# Patient Record
Sex: Female | Born: 1945 | Race: Black or African American | Hispanic: No | Marital: Married | Smoking: Never smoker
Health system: Southern US, Community
[De-identification: ages and names within clinical notes are randomized; demographics above are authoritative.]

## PROBLEM LIST (undated history)

## (undated) DIAGNOSIS — I872 Venous insufficiency (chronic) (peripheral): Secondary | ICD-10-CM

## (undated) DIAGNOSIS — R609 Edema, unspecified: Secondary | ICD-10-CM

## (undated) DIAGNOSIS — T7840XA Allergy, unspecified, initial encounter: Secondary | ICD-10-CM

## (undated) DIAGNOSIS — M1990 Unspecified osteoarthritis, unspecified site: Secondary | ICD-10-CM

## (undated) DIAGNOSIS — B019 Varicella without complication: Secondary | ICD-10-CM

## (undated) DIAGNOSIS — R112 Nausea with vomiting, unspecified: Secondary | ICD-10-CM

## (undated) DIAGNOSIS — Z9889 Other specified postprocedural states: Secondary | ICD-10-CM

## (undated) DIAGNOSIS — R011 Cardiac murmur, unspecified: Secondary | ICD-10-CM

## (undated) HISTORY — DX: Edema, unspecified: R60.9

## (undated) HISTORY — DX: Varicella without complication: B01.9

## (undated) HISTORY — DX: Unspecified osteoarthritis, unspecified site: M19.90

## (undated) HISTORY — DX: Allergy, unspecified, initial encounter: T78.40XA

## (undated) HISTORY — PX: CARPAL TUNNEL RELEASE: SHX101

---

## 1986-07-04 HISTORY — PX: ABDOMINAL HYSTERECTOMY: SHX81

## 1998-05-27 ENCOUNTER — Ambulatory Visit (HOSPITAL_COMMUNITY): Admission: RE | Admit: 1998-05-27 | Discharge: 1998-05-27 | Payer: Self-pay | Admitting: *Deleted

## 1998-05-27 ENCOUNTER — Encounter: Payer: Self-pay | Admitting: *Deleted

## 1999-06-17 ENCOUNTER — Encounter: Payer: Self-pay | Admitting: *Deleted

## 1999-06-17 ENCOUNTER — Ambulatory Visit (HOSPITAL_COMMUNITY): Admission: RE | Admit: 1999-06-17 | Discharge: 1999-06-17 | Payer: Self-pay | Admitting: Obstetrics

## 2000-06-19 ENCOUNTER — Encounter: Payer: Self-pay | Admitting: *Deleted

## 2000-06-19 ENCOUNTER — Ambulatory Visit (HOSPITAL_COMMUNITY): Admission: RE | Admit: 2000-06-19 | Discharge: 2000-06-19 | Payer: Self-pay | Admitting: *Deleted

## 2008-07-04 HISTORY — PX: REPLACEMENT TOTAL KNEE: SUR1224

## 2008-07-04 LAB — HM COLONOSCOPY

## 2008-08-25 ENCOUNTER — Ambulatory Visit: Payer: Self-pay | Admitting: Cardiovascular Disease

## 2008-08-26 ENCOUNTER — Inpatient Hospital Stay (HOSPITAL_COMMUNITY): Admission: RE | Admit: 2008-08-26 | Discharge: 2008-08-30 | Payer: Self-pay | Admitting: Orthopedic Surgery

## 2008-10-17 ENCOUNTER — Ambulatory Visit (HOSPITAL_COMMUNITY): Admission: RE | Admit: 2008-10-17 | Discharge: 2008-10-17 | Payer: Self-pay | Admitting: Orthopedic Surgery

## 2009-01-20 ENCOUNTER — Inpatient Hospital Stay (HOSPITAL_COMMUNITY): Admission: RE | Admit: 2009-01-20 | Discharge: 2009-01-22 | Payer: Self-pay | Admitting: Orthopedic Surgery

## 2010-10-10 LAB — CBC
HCT: 36.2 % (ref 36.0–46.0)
Hemoglobin: 12.3 g/dL (ref 12.0–15.0)
MCHC: 33.9 g/dL (ref 30.0–36.0)
MCV: 89.1 fL (ref 78.0–100.0)
Platelets: 268 10*3/uL (ref 150–400)
RBC: 4.06 MIL/uL (ref 3.87–5.11)
RDW: 14.6 % (ref 11.5–15.5)
WBC: 5.7 10*3/uL (ref 4.0–10.5)

## 2010-10-10 LAB — GLUCOSE, CAPILLARY
Glucose-Capillary: 112 mg/dL — ABNORMAL HIGH (ref 70–99)
Glucose-Capillary: 120 mg/dL — ABNORMAL HIGH (ref 70–99)
Glucose-Capillary: 126 mg/dL — ABNORMAL HIGH (ref 70–99)
Glucose-Capillary: 148 mg/dL — ABNORMAL HIGH (ref 70–99)
Glucose-Capillary: 96 mg/dL (ref 70–99)

## 2010-10-10 LAB — TYPE AND SCREEN: Antibody Screen: NEGATIVE

## 2010-10-13 LAB — CBC
MCHC: 34.2 g/dL (ref 30.0–36.0)
Platelets: 318 10*3/uL (ref 150–400)
RBC: 4.09 MIL/uL (ref 3.87–5.11)
WBC: 5 10*3/uL (ref 4.0–10.5)

## 2010-10-19 LAB — TYPE AND SCREEN: ABO/RH(D): O NEG

## 2010-10-19 LAB — PROTIME-INR
INR: 1 (ref 0.00–1.49)
INR: 1.3 (ref 0.00–1.49)
INR: 2 — ABNORMAL HIGH (ref 0.00–1.49)
INR: 2.4 — ABNORMAL HIGH (ref 0.00–1.49)
INR: 2.8 — ABNORMAL HIGH (ref 0.00–1.49)
Prothrombin Time: 23.4 seconds — ABNORMAL HIGH (ref 11.6–15.2)
Prothrombin Time: 27.6 seconds — ABNORMAL HIGH (ref 11.6–15.2)
Prothrombin Time: 31.5 seconds — ABNORMAL HIGH (ref 11.6–15.2)

## 2010-10-19 LAB — CBC
Hemoglobin: 10.3 g/dL — ABNORMAL LOW (ref 12.0–15.0)
Hemoglobin: 10.5 g/dL — ABNORMAL LOW (ref 12.0–15.0)
Hemoglobin: 12.8 g/dL (ref 12.0–15.0)
MCHC: 34.7 g/dL (ref 30.0–36.0)
Platelets: 192 10*3/uL (ref 150–400)
Platelets: 206 10*3/uL (ref 150–400)
RBC: 3.29 MIL/uL — ABNORMAL LOW (ref 3.87–5.11)
RBC: 4.06 MIL/uL (ref 3.87–5.11)
RDW: 13.1 % (ref 11.5–15.5)
RDW: 13.3 % (ref 11.5–15.5)
WBC: 5.8 10*3/uL (ref 4.0–10.5)
WBC: 6.8 10*3/uL (ref 4.0–10.5)

## 2010-10-19 LAB — DIFFERENTIAL
Lymphocytes Relative: 29 % (ref 12–46)
Lymphs Abs: 1.7 10*3/uL (ref 0.7–4.0)
Monocytes Absolute: 0.7 10*3/uL (ref 0.1–1.0)
Monocytes Relative: 12 % (ref 3–12)
Neutro Abs: 3.1 10*3/uL (ref 1.7–7.7)
Neutrophils Relative %: 54 % (ref 43–77)

## 2010-10-19 LAB — BASIC METABOLIC PANEL
BUN: 10 mg/dL (ref 6–23)
BUN: 6 mg/dL (ref 6–23)
CO2: 27 mEq/L (ref 19–32)
Calcium: 8.2 mg/dL — ABNORMAL LOW (ref 8.4–10.5)
Calcium: 8.4 mg/dL (ref 8.4–10.5)
Calcium: 9 mg/dL (ref 8.4–10.5)
Creatinine, Ser: 0.74 mg/dL (ref 0.4–1.2)
Creatinine, Ser: 0.77 mg/dL (ref 0.4–1.2)
GFR calc Af Amer: >60 mL/min (ref >60)
GFR calc non Af Amer: >60 mL/min (ref >60)
GFR calc non Af Amer: >60 mL/min (ref >60)
Glucose, Bld: 122 mg/dL — ABNORMAL HIGH (ref 70–99)
Glucose, Bld: 130 mg/dL — ABNORMAL HIGH (ref 70–99)
Potassium: 4.2 mEq/L (ref 3.5–5.1)
Sodium: 136 mEq/L (ref 135–145)
Sodium: 138 mEq/L (ref 135–145)
Sodium: 141 mEq/L (ref 135–145)

## 2010-10-19 LAB — URINALYSIS, ROUTINE W REFLEX MICROSCOPIC
Glucose, UA: NEGATIVE mg/dL
Ketones, ur: NEGATIVE mg/dL
Nitrite: NEGATIVE
Specific Gravity, Urine: 1.025 (ref 1.005–1.030)
pH: 7.5 (ref 5.0–8.0)

## 2010-10-19 LAB — GLUCOSE, CAPILLARY
Glucose-Capillary: 112 mg/dL — ABNORMAL HIGH (ref 70–99)
Glucose-Capillary: 115 mg/dL — ABNORMAL HIGH (ref 70–99)
Glucose-Capillary: 118 mg/dL — ABNORMAL HIGH (ref 70–99)
Glucose-Capillary: 122 mg/dL — ABNORMAL HIGH (ref 70–99)
Glucose-Capillary: 128 mg/dL — ABNORMAL HIGH (ref 70–99)
Glucose-Capillary: 137 mg/dL — ABNORMAL HIGH (ref 70–99)
Glucose-Capillary: 147 mg/dL — ABNORMAL HIGH (ref 70–99)
Glucose-Capillary: 150 mg/dL — ABNORMAL HIGH (ref 70–99)
Glucose-Capillary: 165 mg/dL — ABNORMAL HIGH (ref 70–99)

## 2010-10-19 LAB — ABO/RH: ABO/RH(D): O NEG

## 2010-11-16 NOTE — Op Note (Signed)
[name], [name]                ACCOUNT NO.:  [id number]   MEDICAL RECORD NO.:  [id number]          PATIENT TYPE:  AMB   LOCATION:  SDS                          FACILITY:  [facility]   PHYSICIAN:  Burnard Bunting, M.D.    DATE OF BIRTH:  Feb 25, 1946   DATE OF PROCEDURE:  10/17/2008  DATE OF DISCHARGE:                               OPERATIVE REPORT   PREOPERATIVE DIAGNOSIS:  Right knee osteofibrosis.   POSTOPERATIVE DIAGNOSIS:  Right knee osteofibrosis.   PROCEDURE:  Right knee manipulation under anesthesia with injection of  postoperative pain relief agents.   SURGEON:  Burnard Bunting, M.D.   ASSISTANT:  None.   ANESTHESIA:  General.   INDICATIONS:  Julia Mcfarland is a 65 year old patient.  She is about 6  weeks out from right total knee replacement with limited range of  motion.  She presents now for operative management of osteofibrosis.  Preop range of motion is 5-50 degrees, post manipulation range of motion  is 5-95 degrees.   PROCEDURE IN DETAIL:  The patient was brought to the operating room  where general endotracheal anesthesia was induced.  A time-out was  called.  Right knee was then manipulated under anesthesia after muscle  relaxation was incurred.  Did achieve about 95 degrees of flexion  against gravity.  At this time, the knee was injected with a solution of  Marcaine, morphine, and clonidine.  Photos were made.  The patient  tolerated the procedure well without immediate complications.  She was  transferred to recovery room in stable condition.      Burnard Bunting, M.D.  Electronically Signed     GSD/MEDQ  D:  10/17/2008  T:  10/17/2008  Job:  [id number]

## 2010-11-16 NOTE — Op Note (Signed)
Julia Mcfarland, Julia Mcfarland                ACCOUNT NO.:  [id number]   MEDICAL RECORD NO.:  [id number]          PATIENT TYPE:  INP   LOCATION:  5006                         FACILITY:  [facility]   PHYSICIAN:  Burnard Bunting, M.D.    DATE OF BIRTH:  1945-12-28   DATE OF PROCEDURE:  01/20/2009  DATE OF DISCHARGE:                               OPERATIVE REPORT   PREOPERATIVE DIAGNOSIS:  Right knee arthrofibrosis.   POSTOPERATIVE DIAGNOSIS:  Right knee arthrofibrosis.   PROCEDURE:  Right knee open arthrofibrosis excision and synovectomy.   SURGEON:  Burnard Bunting, MD   ASSISTANT:  None.   ANESTHESIA:  General endotracheal.   ESTIMATED BLOOD LOSS:  Less than 50.   TOURNIQUET TIME:  Approximately 36 minutes of 325 mmHg.   INDICATIONS:  Julia Mcfarland is a 66 year old patient with right knee pain  and stiffness following total knee arthroplasty approximately 4-5  months.  The patient presents now for open synovectomy after failure of  manipulation.  Preoperative range of motion 15 to approximately 60  degrees of flexion.  Postoperative range of motion 10-105 degrees of  flexion.   PROCEDURE IN DETAIL:  The patient was brought to the operating room  where general endotracheal anesthesia was induced.  Preoperative  antibiotics were administered.  Right leg and foot was prepped with  DuraPrep solution after prescrubbed with chlorhexidine, alcohol, and  Betadine, which was allowed to air dry.  Collier Flowers was used to cover the  operative field.  Time-out was called.  Right leg was elevated  and  exsanguinated with an Esmarch wrap.  Tourniquet was inflated.  Previous  incision was utilized and extended proximally and distally about a 3 cm.  Skin and subcutaneous tissues were sharply divided.  Dense adherent scar  tissue was present even in the suprapatellar region.  Median  parapatellar arthrotomy was made.  There was complete obliteration of  the medial and lateral gutters as well as the suprapatellar  space.  Using careful blunt and sharp dissection, dense adherent scar tissue was  removed from the medial gutter down to the tibial plateau.  In a similar  fashion, adherent scar tissue was removed from the distal anterior femur  and up in order to recreate a suprapatellar pouch.  At this time,  attention was directed towards the lateral gutter.  Dense adherent scar  tissue from behind the patella was removed and that plane was maintained  in order to recreate the lateral gutter.  Following the removal of this  adherent scar tissue, the knee had a range of motion to 105 easily.  At  this time, 3 L of irrigating solution was utilized.  Thrombin spray was  placed into the incision and the tourniquet was released.  Bleeding  points encountered were controlled with electrocautery.  Incision was  then closed over a Hemovac drain using interrupted inverted #1 Vicryl  suture, 0-Vicryl suture, 2-0  Vicryl suture, and skin staples.  Solution of Marcaine and morphine  finally injected into the knee.  The patient was placed in a bulky  dressing and will begin TPN medially  in the recovery room.  The patient  tolerated the procedure well without immediate complication.      Burnard Bunting, M.D.  Electronically Signed     GSD/MEDQ  D:  01/20/2009  T:  01/21/2009  Job:  [id number]

## 2010-11-16 NOTE — Assessment & Plan Note (Signed)
Julia Mcfarland                            CARDIOLOGY OFFICE NOTE   Julia Mcfarland, Julia Mcfarland                       MRN:          [id number]  DATE:08/25/2008                            DOB:          April 29, 1946    Julia Mcfarland is a 65 year old patient referred by Dr. Dorene Grebe for preop  clearance.  Unfortunately, the patient showed up on Monday and is  already scheduled to have surgery tomorrow at 1130.  We initially had no  records on the patient.  My nurse called over to [facility] and got  some office notes.  Again unfortunately, the reason for her referral was  not apparent.  The patient indicates that she appears to have had some  test by [facility].  She describes having an echo.  She  says this was abnormal.  I did not have any echo report.  I personally  called Dr. Diamantina Providence office and talk to 1 of the PAs.   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  I explained to the patient that this is a  insignificant finding and has no clinical relevance whatsoever.   In talking to the patient, she is obese.  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  She has a  large ganglion cyst and bone on bone in the right knee and needs right  knee replacement.  She has not had previous surgery before.  She is  nonsmoker, non-diabetic, although she says she is borderline.  She is  not on any blood pressure medicine or diabetes medicine.  Cholesterol  status is unknown.   She is sedentary, but in general does not have chest pain or  palpitations.  She has trace lower extremity edema that appears  dependent.   She denies any allergies.  Her past medical history is remarkable for no  prior surgeries or hospitalizations.   She does get occasional bronchitis.  She takes Naprosyn and Tylenol for  her pain.  Family history is positive for diabetes and high  blood  pressure.  Review of systems is otherwise negative.   PHYSICAL EXAMINATION:  GENERAL:  Her exam is remarkable for an  overweight black female.  She is somewhat confused.  She is very poor  historian.  She is 5 feet 1 inches, 240 pounds.  VITAL SIGNS:  Respiratory rate 14, afebrile.  Blood pressure 140/80,  pulse is 62 and regular.  HEENT:  Unremarkable.  Carotids are normal without bruit.  No  lymphadenopathy, thyromegaly, JVP elevation.  LUNGS:  Clear.  Good diaphragmatic motion.  No wheezing.  S1 and S2.  Normal heart sounds.  PMI normal.  ABDOMEN:  Benign.  Bowel sounds positive.  No AAA.  No tenderness.  No  bruit.  No hepatosplenomegaly or hepatojugular reflux.  EXTREMITIES:  Distal pulses are intact.  No trace edema.  NEURO:  Nonfocal.  SKIN:  Warm and dry.  MUSCULOSKELETAL:  No muscular weakness.   EKG is totally normal.  IMPRESSION:  1. In regards to preoperative clearance, the patient's biggest risk is      for preoperative deep venous thrombosis from a cardiac perspective.      She has limited risk factors.  Vital signs were normal.      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  She is cleared for      surgery tomorrow.  2. Deep venous thrombosis prophylaxis.  Because of the patient's body      habitus, she has lower extremity edema preexisting.  Lovenox with      Coumadin overlap around the knee replacement and early mobilization      will be key to prevent pulmonary embolism.  3. Borderline diabetes.  I would follow sugars in the hospital.  She      has body habitus to suggest type 2 diabetes.  I would make sure      that [facility], checks the hemoglobin A1c quarterly      on her.  4. Arthritis and right knee pain.  To have surgery with Dr. August Saucer      tomorrow.  Continue nonsteroidals and Naprosyn.     Noralyn Pick. Eden Emms, MD, [facility]  Electronically Signed    PCN/MedQ   DD: 08/25/2008  DT: 08/25/2008  Job #: [id number]

## 2010-11-16 NOTE — Op Note (Signed)
[name], [name] NO.:  [id number]   MEDICAL RECORD NO.:  [id number]          PATIENT TYPE:  INP   LOCATION:  5033                         FACILITY:  [facility]   PHYSICIAN:  Burnard Bunting, M.D.    DATE OF BIRTH:  July 20, 1945   DATE OF PROCEDURE:  DATE OF DISCHARGE:                               OPERATIVE REPORT   PREOPERATIVE DIAGNOSIS:  Right knee arthritis.   POSTOPERATIVE DIAGNOSIS:  Right knee arthritis.   PROCEDURE:  Right total knee replacement.   SURGEON:  Burnard Bunting, MD   ASSISTANT:  Jerolyn Shin. Lavender, MD   ANESTHESIA:  General endotracheal.   ESTIMATED BLOOD LOSS:  150.   TOURNIQUET TIME:  107 minutes at 325 mmHg.   INDICATIONS:  Julia Mcfarland is a 65 year old female with end-stage right  knee arthritis who presents for operative management after failure of  conservative management and explanation of risks and benefits.   COMPONENTS INSERTED:  DePuy rotating platform, 3 femur, 35 patella, 3  tibia, 15 poly.   PROCEDURE IN DETAIL:  The patient was brought to the operating room  where general endotracheal anesthesia was induced.  Preoperative  antibiotics were administered.  Right leg was prescrubbed with alcohol  and Betadine which was allowed to air dry and then prepped with DuraPrep  solution and draped in a sterile manner.  Julia Mcfarland was used cover the  operative field.  Leg was elevated and exsanguinated with the Esmarch.  Tourniquet was inflated.  Anterior approach to the knee was utilized.  Skin and subcutaneous tissues were sharply divided.  Median parapatellar  arthrotomy was made.  Patella was everted.  Precise location of the  arthrotomy was marked with 0 Vicryl suture.  Patella could not be  everted.  Lateral patellofemoral ligament was released.  Soft tissue  from the anterior and distal aspect of the femur was removed while  maintaining the periosteum over the distal femur.  Fat pad was partially  excised.  The patient had  preoperative varus alignment.  A moderate  medial soft tissue release was performed of the superficial medial  collateral ligament.  The osteophytes were removed.  ACL and PCL were  released.  Two pins were placed in the distal and medial femur and  proximal medial tibia.  Bicortical contact was achieved.  Registration  points were achieved including the hips in rotation and bimalleolar  access in various points around the knee.  A cut was then made  perpendicular in mechanical axis with the collateral and posterior  structures well protected.  The tensioner was then placed, and tension  was checked in both extension and flexion.  Distal femoral cut was then  made.  A spacer block was placed, and the patient achieved full  extension with a 10-mm spacer block.  The chamfer cuts and box cut was  then performed.  Tibia was sized to 3, femur was also sized to 3.  The  patella was then cut in freehand fashion from 22 down to 12 mm.  Trial  patellar button was placed.  Trial reduction was performed with  both 10  and a 12.5 spacer with some hyperextension present.  At this time, trial  components were removed.  The bony cuts were then thoroughly irrigated.  The components were cemented into position.  After cement hardening had  occurred, trial reduction was again performed with a 12.5 and 15 poly  spacer.  The patient did achieve full extension with gravity with the 15  poly spacer.  This gave better stability in extension and 90 degrees of  flexion.  The 15 poly spacer was placed.  The patient achieved full  extension, full flexion, excellent patellar tracking with no thumbs  technique.  Tourniquet was released.  Bleeding points were controlled  with electrocautery.  Thorough irrigation was performed.  Hemovac drain  was placed.  The knee was then closed over a bolster using #1 Vicryl  suture, 0 Vicryl suture, 2-0 Vicryl suture, and skin staples.  A 3-0  nylon was used to close the incisions  for the pins.  The patient  tolerated the procedure well without immediate complication.  Foot was  perfused at conclusion of the case.  Dr. Lenny Mcfarland assistance was  required at all times during the case for retraction of important  neurovascular structures and mobilization of the tissues, and his  assistance was a medical necessity.      Burnard Bunting, M.D.  Electronically Signed     GSD/MEDQ  D:  08/26/2008  T:  08/27/2008  Job:  [id number]

## 2010-11-19 NOTE — Discharge Summary (Signed)
Julia Mcfarland, Julia Mcfarland                ACCOUNT NO.:  [id number]   MEDICAL RECORD NO.:  [id number]          PATIENT TYPE:  INP   LOCATION:  5033                         FACILITY:  [facility]   PHYSICIAN:  Burnard Bunting, M.D.    DATE OF BIRTH:  22-Jan-1946   DATE OF ADMISSION:  08/26/2008  DATE OF DISCHARGE:  08/30/2008                               DISCHARGE SUMMARY   ADMISSION DIAGNOSES:  1. Right knee osteoarthritis.  2. History of fibromyalgia.   DISCHARGE DIAGNOSES:  1. Right knee osteoarthritis.  2. History of fibromyalgia.  3. Posthemorrhagic anemia.   PROCEDURE:  On August 26, 2008, the patient underwent right total knee  arthroplasty performed by Dr. August Saucer assisted by Dr. Tresa Res under  general anesthesia.   CONSULTATIONS:  None.   BRIEF HISTORY:  The patient is a 65 year old female with end-stage  arthritis of the right knee with failure to respond to conservative  treatment.  It was felt she would benefit from surgical intervention and  was admitted for the procedure as stated above.   BRIEF HOSPITAL COURSE:  The patient tolerated the procedure under  general anesthesia without complications.  Postoperatively,  neurovascular motor function was noted to be intact.  The patient was  started on physical therapy for ambulation and gait training as well as  range of motion, stretching, and strengthening exercises.  CPM was  utilized for passive range of motion.  Dressing changes were done daily  and wound was healing without drainage or erythema.  The patient was  placed on Coumadin for DVT prophylaxis.  Adjustments in Coumadin made  according to daily protimes by the pharmacist.  At discharge, INR was  2.4.  Hemoglobin and hematocrit were 12.8 and 36.9 on admission and at  discharge 10.5 and 30.3.  Chemistry studies on admission were within  normal limits and repeat studies also showed normal values.  Urinalysis  on admission negative for urinary tract infection.  Occupational  therapy  assisted with the patient's ADLs.  Prior to discharge, she was  ambulating with the physical therapist in the hallway.  She was using a  knee immobilizer for ambulatory purposes only.  She was able to ambulate  as much as 130 feet prior to discharge.  It was felt she was safe for  discharge to her home.   PLAN:  Arrangements made for home health physical therapy and durable  medical equipment.  Prescriptions include Percocet 5/325one every 4-6  hours as needed for pain, Coumadin 5 mg daily, Robaxin 500 mg 1 every 8  hours as needed for spasm.  She will resume home medications as taken  prior to admission with the exception of her hydrocodone and was given a  medication reconciliation form with these instructions.  She will keep  her dressing dry and clean and change as needed.  She will be allowed to  shower if there is no wound drainage.  She will follow up in  2 weeks with Dr. August Saucer in the office and will call to arrange the  appointment.  She will be on a low-carbohydrate diet.  She is instructed  to call the office should she have questions or concerns prior to return  office visit.   CONDITION ON DISCHARGE:  Stable.      Wende Neighbors, P.A.      Burnard Bunting, M.D.  Electronically Signed    SMV/MEDQ  D:  11/20/2008  T:  11/21/2008  Job:  [id number]

## 2013-08-14 ENCOUNTER — Ambulatory Visit (INDEPENDENT_AMBULATORY_CARE_PROVIDER_SITE_OTHER): Payer: Medicare HMO | Admitting: Internal Medicine

## 2013-08-14 ENCOUNTER — Encounter: Payer: Self-pay | Admitting: Internal Medicine

## 2013-08-14 VITALS — BP 108/64 | HR 66 | Temp 97.7°F | Ht 60.5 in | Wt 275.5 lb

## 2013-08-14 DIAGNOSIS — Z13220 Encounter for screening for lipoid disorders: Secondary | ICD-10-CM

## 2013-08-14 DIAGNOSIS — Z6841 Body Mass Index (BMI) 40.0 and over, adult: Secondary | ICD-10-CM

## 2013-08-14 DIAGNOSIS — M810 Age-related osteoporosis without current pathological fracture: Secondary | ICD-10-CM

## 2013-08-14 DIAGNOSIS — Z23 Encounter for immunization: Secondary | ICD-10-CM

## 2013-08-14 DIAGNOSIS — E669 Obesity, unspecified: Secondary | ICD-10-CM

## 2013-08-14 DIAGNOSIS — E66813 Obesity, class 3: Secondary | ICD-10-CM | POA: Insufficient documentation

## 2013-08-14 DIAGNOSIS — R609 Edema, unspecified: Secondary | ICD-10-CM | POA: Insufficient documentation

## 2013-08-14 NOTE — Progress Notes (Signed)
HPI: Pt presents to establish care.  Pt is transferring from Dr. Joylene John due to insurance. Her only concern today was restarting a diet pill she was taking prescibed by her former PCP. She is trying to lose weight, however still struggles with appetite control. She does participate in weekly exercise program.   Health Maintenance: Pap Smear: 2013 Mammogram: 2014  Flu vaccine: 2013; declines Pneumovax: never Tetanus: >10 yrs ago Zostavax: never Bone Density: 2014 Colonoscopy: 2010 Eye Exam: 2014 Dentist: 2014  Edema: Take Lasix 20mg  PO daily  Arthritis: Takes Calcium 600mg  PO TID Take prn Ibuprofen or Tylenol for pain Does exercise weekly Trying to lose weight  Diabetes:  This is borderline; trying diet control  Past Medical History  Diagnosis Date  . Allergy   . Arthritis   . Chicken pox   . Edema   . Diabetes mellitus without complication     borderline      No current outpatient prescriptions on file.   No current facility-administered medications for this visit.    No Known Allergies  No family history on file.  History   Social History  . Marital Status: Married    Spouse Name: N/A    Number of Children: N/A  . Years of Education: N/A   Occupational History  . Not on file.   Social History Main Topics  . Smoking status: Never Smoker   . Smokeless tobacco: Never Used  . Alcohol Use: Yes     Comment: occasional  . Drug Use: No  . Sexual Activity: Not on file   Other Topics Concern  . Not on file   Social History Narrative  . No narrative on file    ROS:  Constitutional: Denies fever, malaise, fatigue, headache or abrupt weight changes.  HEENT: Denies blurry vision or double vision. Respiratory: Denies difficulty breathing, shortness of breath, cough or sputum production.   Cardiovascular:Endorses bilateral leg swelling.  Denies chest pain, chest tightness, palpitations. Musculoskeletal: Denies decrease in range of motion, difficulty  with gait, muscle pain or joint pain and swelling.  Neurological: Denies dizziness, difficulty with memory, difficulty with speech or problems with balance and coordination.   No other specific complaints in a complete review of systems (except as listed in HPI above).  PE: Filed Vitals:   08/14/13 1510  BP: 108/64  Pulse: 66  Temp: 97.7 F (36.5 C)     Ht 5' 0.5" (1.537 m)  Wt 275 lb 8 oz (124.966 kg)  BMI 52.90 kg/m2 Wt Readings from Last 3 Encounters:  08/14/13 275 lb 8 oz (124.966 kg)    General: Appears their stated age, obese, well developed, well nourished in NAD.  Neck: Normal range of motion. Neck supple, trachea midline. No massses, lumps or thyromegaly present.  Cardiovascular: Normal rate and rhythm. S1,S2 noted.  No murmur, rubs or gallops noted. No JVD. 2 + BLE edema. No carotid bruits noted. Pulmonary/Chest: Normal effort and positive vesicular breath sounds. No respiratory distress. No wheezes, rales or ronchi noted. . Musculoskeletal: Normal range of motion. No signs of joint swelling. No difficulty with gait.  Neurological: Alert and oriented. Cranial nerves II-XII intact. Coordination normal. +DTRs bilaterally. Psychiatric: Mood and affect normal. Behavior is normal. Judgment and thought content normal.      Assessment and Plan: Edema Continue Lasix daily  Arthritis Continue current treatments  DM: Will check A1C today  Health Maintenance: Check lipid panel, CBC, CMET, and A1C Discussed diet and exercise; try to cut down on  sweets Please call with diet pill you were taking before Will call with lab results Received TDAP today  Shareena Nusz, Demetrius Charity, Student-NP

## 2013-08-14 NOTE — Patient Instructions (Addendum)

## 2013-08-14 NOTE — Addendum Note (Signed)
Addended by: Lurlean Nanny on: 08/14/2013 04:32 PM   Modules accepted: Orders

## 2013-08-14 NOTE — Assessment & Plan Note (Signed)
Already on calcium Continue for now

## 2013-08-14 NOTE — Assessment & Plan Note (Signed)
On lasix 20 mg daily Continue for now

## 2013-08-14 NOTE — Assessment & Plan Note (Signed)
Advised her to work on diet and exercise  Will check A1C today

## 2013-08-14 NOTE — Progress Notes (Signed)
HPI  Pt presents to the clinic today to establish care. She is transferring care from [facility]. She does have some concerns about her weight. She does report that her previous PCP had her on a diet pill. She does watch her diet and is exercising weekly. She feels like her struggle is appetite control.  Flu: 2013 Tetanus: > 10 years ago Pneumovax: never Zostovax: never Pap Smear: 2013 Mammogram: 2014 Colon Screening: 2010 Bone Density: 2014 Eye Doctor: yearly Dentist: biannually   Past Medical History  Diagnosis Date  . Allergy   . Arthritis   . Chicken pox   . Edema   . Diabetes mellitus without complication     borderline     No current outpatient prescriptions on file.   No current facility-administered medications for this visit.    No Known Allergies  Family History  Problem Relation Age of Onset  . Arthritis Mother   . Heart disease Mother   . Hypertension Mother   . Diabetes Mother   . Stroke Father   . Hypertension Father   . Stroke Sister   . Hypertension Sister   . Heart disease Maternal Grandfather   . Diabetes Paternal Grandmother   . Heart disease Paternal Grandfather     History   Social History  . Marital Status: Married    Spouse Name: N/A    Number of Children: N/A  . Years of Education: N/A   Occupational History  . Not on file.   Social History Main Topics  . Smoking status: Never Smoker   . Smokeless tobacco: Never Used  . Alcohol Use: Yes     Comment: occasional  . Drug Use: No  . Sexual Activity: Not on file   Other Topics Concern  . Not on file   Social History Narrative  . No narrative on file    ROS:  Constitutional: Denies fever, malaise, fatigue, headache or abrupt weight changes.  Respiratory:  Denies difficulty breathing, shortness of breath, cough or sputum production.   Cardiovascular: Pt reports BLE edema. Denies chest pain, chest tightness, palpitations or swelling in the hands.   Musculoskeletal: Denies decrease in range of motion, difficulty with gait, muscle pain or joint pain and swelling.    No other specific complaints in a complete review of systems (except as listed in HPI above).  PE:  BP 108/64  Pulse 66  Temp(Src) 97.7 F (36.5 C) (Oral)  Ht 5' 0.5" (1.537 m)  Wt 275 lb 8 oz (124.966 kg)  BMI 52.90 kg/m2  SpO2 98% Wt Readings from Last 3 Encounters:  08/14/13 275 lb 8 oz (124.966 kg)    General: Appears her stated age, overweight but well developed, well nourished in NAD. Cardiovascular: Normal rate and rhythm. S1,S2 noted.  No murmur, rubs or gallops noted. No JVD. 2+ non pitting BLE edema. No carotid bruits noted. Pulmonary/Chest: Normal effort and positive vesicular breath sounds. No respiratory distress. No wheezes, rales or ronchi noted.  Musculoskeletal: Normal range of motion. No signs of joint swelling. No difficulty with gait.   BMET    Component Value Date/Time   NA 136 08/28/2008 0501   K 3.8 08/28/2008 0501   CL 103 08/28/2008 0501   CO2 28 08/28/2008 0501   GLUCOSE 130* 08/28/2008 0501   BUN 6 08/28/2008 0501   CREATININE 0.74 08/28/2008 0501   CALCIUM 8.4 08/28/2008 0501   GFRNONAA >60 08/28/2008 0501   GFRAA  Value: >60  The eGFR has been calculated using the MDRD equation. This calculation has not been validated in all clinical situations. eGFR's persistently <60 mL/min signify possible Chronic Kidney Disease. 08/28/2008 0501    Lipid Panel  No results found for this basename: chol, trig, hdl, cholhdl, vldl, ldlcalc    CBC    Component Value Date/Time   WBC 5.7 01/20/2009 0905   RBC 4.06 01/20/2009 0905   HGB 12.3 01/20/2009 0905   HCT 36.2 01/20/2009 0905   PLT 268 01/20/2009 0905   MCV 89.1 01/20/2009 0905   MCHC 33.9 01/20/2009 0905   RDW 14.6 01/20/2009 0905   LYMPHSABS 1.7 08/21/2008 1405   MONOABS 0.7 08/21/2008 1405   EOSABS 0.2 08/21/2008 1405   BASOSABS 0.1 08/21/2008 1405    Hgb A1C No results found for this  basename: HGBA1C     Assessment and Plan:  Health Maintenance:  Advised her to work on diet and exercise Tdap given today Will obtan lipids, CMET and CBC today

## 2013-08-14 NOTE — Progress Notes (Signed)
Pre-visit discussion using our clinic review tool. No additional management support is needed unless otherwise documented below in the visit note.  

## 2013-08-15 LAB — COMPREHENSIVE METABOLIC PANEL
ALK PHOS: 63 U/L (ref 39–117)
ALT: 20 U/L (ref 0–35)
AST: 19 U/L (ref 0–37)
Albumin: 4 g/dL (ref 3.5–5.2)
BILIRUBIN TOTAL: 0.7 mg/dL (ref 0.3–1.2)
BUN: 19 mg/dL (ref 6–23)
CO2: 25 mEq/L (ref 19–32)
Calcium: 9.2 mg/dL (ref 8.4–10.5)
Chloride: 107 mEq/L (ref 96–112)
Creatinine, Ser: 0.8 mg/dL (ref 0.4–1.2)
GFR: 87.12 mL/min (ref >60.00)
Glucose, Bld: 86 mg/dL (ref 70–99)
POTASSIUM: 4 meq/L (ref 3.5–5.1)
SODIUM: 140 meq/L (ref 135–145)
TOTAL PROTEIN: 7.3 g/dL (ref 6.0–8.3)

## 2013-08-15 LAB — CBC
HCT: 39.7 % (ref 36.0–46.0)
Hemoglobin: 12.7 g/dL (ref 12.0–15.0)
MCHC: 32 g/dL (ref 30.0–36.0)
MCV: 95.3 fl (ref 78.0–100.0)
Platelets: 254 K/uL (ref 150.0–400.0)
RBC: 4.17 Mil/uL (ref 3.87–5.11)
RDW: 13.9 % (ref 11.5–14.6)
WBC: 5.5 K/uL (ref 4.5–10.5)

## 2013-08-15 LAB — LIPID PANEL
Cholesterol: 201 mg/dL — ABNORMAL HIGH (ref 0–200)
HDL: 83.7 mg/dL
Total CHOL/HDL Ratio: 2
Triglycerides: 43 mg/dL (ref 0.0–149.0)
VLDL: 8.6 mg/dL (ref 0.0–40.0)

## 2013-08-15 LAB — TSH: TSH: 0.77 u[IU]/mL (ref 0.35–5.50)

## 2013-08-15 LAB — LDL CHOLESTEROL, DIRECT: Direct LDL: 113.6 mg/dL

## 2013-08-15 LAB — HEMOGLOBIN A1C: Hgb A1c MFr Bld: 5.6 % (ref 4.6–6.5)

## 2013-08-22 ENCOUNTER — Telehealth: Payer: Self-pay

## 2013-08-22 NOTE — Telephone Encounter (Signed)
Pt request recent lab results; pt notified as instructed per result note. Pt voiced understanding.

## 2013-09-24 ENCOUNTER — Telehealth: Payer: Self-pay | Admitting: Internal Medicine

## 2013-09-24 NOTE — Telephone Encounter (Signed)
Pt is needing refill on Phentermine 375 mg. Pt uses [facility] on [address] [place].

## 2013-09-26 NOTE — Telephone Encounter (Signed)
I require monthly OV when on phentermine, she will need an appt

## 2013-09-26 NOTE — Telephone Encounter (Signed)
Left message on machine that pt would need an appt for refill.

## 2013-09-27 ENCOUNTER — Encounter: Payer: Self-pay | Admitting: Internal Medicine

## 2013-09-27 ENCOUNTER — Ambulatory Visit (INDEPENDENT_AMBULATORY_CARE_PROVIDER_SITE_OTHER): Payer: Medicare HMO | Admitting: Internal Medicine

## 2013-09-27 VITALS — BP 132/82 | HR 74 | Temp 98.1°F | Wt 276.2 lb

## 2013-09-27 DIAGNOSIS — E669 Obesity, unspecified: Secondary | ICD-10-CM

## 2013-09-27 DIAGNOSIS — M5412 Radiculopathy, cervical region: Secondary | ICD-10-CM

## 2013-09-27 MED ORDER — PHENTERMINE HCL 37.5 MG PO CAPS: 37.5000 mg | ORAL_CAPSULE | ORAL | Status: DC

## 2013-09-27 NOTE — Progress Notes (Signed)
Pre visit review using our clinic review tool, if applicable. No additional management support is needed unless otherwise documented below in the visit note. 

## 2013-09-27 NOTE — Assessment & Plan Note (Signed)
Discussed risks and benefits of starting phentermine Pt would like to proceed with medical therapy Encouraged her to work on diet and exercise Recommended water aerobics to take some of the excess weight off the knees RX for phentermine  RTC in 1 month for weight check/med refill

## 2013-09-27 NOTE — Progress Notes (Signed)
Subjective:    Patient ID: Julia Mcfarland, female    DOB: 1946/06/01, 68 y.o.   MRN: [id number]  HPI  Pt presents to the clinic today with c/o tingling down the right side of her neck. She noticed this about 2 weeks. The tingling comes and goes. She denies any specific injury to the area but does reports she noticed it shortly after moving some furniture. She has not taken anything OTC. She denies headache, blurred vision, or neck pain.  Additionally, she would like to restart phentermine. She has been on it short term in the past. She has a hard time exercising due to her excess weight. She does try to eat healthy but notices that she just cant lose weight like she used to.  Review of Systems      Past Medical History  Diagnosis Date  . Allergy   . Arthritis   . Chicken pox   . Edema   . Diabetes mellitus without complication     borderline    Current Outpatient Prescriptions  Medication Sig Dispense Refill  . calcium carbonate (OS-CAL) 600 MG TABS tablet Take 600 mg by mouth 3 (three) times daily with meals.      . furosemide (LASIX) 20 MG tablet Take 20 mg by mouth daily.       No current facility-administered medications for this visit.    No Known Allergies  Family History  Problem Relation Age of Onset  . Arthritis Mother   . Heart disease Mother   . Hypertension Mother   . Diabetes Mother   . Stroke Father   . Hypertension Father   . Stroke Sister   . Hypertension Sister   . Heart disease Maternal Grandfather   . Diabetes Paternal Grandmother   . Heart disease Paternal Grandfather     History   Social History  . Marital Status: Married    Spouse Name: N/A    Number of Children: N/A  . Years of Education: N/A   Occupational History  . Not on file.   Social History Main Topics  . Smoking status: Never Smoker   . Smokeless tobacco: Never Used  . Alcohol Use: Yes     Comment: occasional  . Drug Use: No  . Sexual Activity: No   Other Topics  Concern  . Not on file   Social History Narrative  . No narrative on file     Constitutional: Pt reports weight gain. Denies fever, malaise, fatigue, headache.  Musculoskeletal: Pt reports tingling sensation in neck. Denies decrease in range of motion, difficulty with gait, muscle pain or joint pain and swelling.     No other specific complaints in a complete review of systems (except as listed in HPI above).  Objective:   Physical Exam   BP 132/82  Pulse 74  Temp(Src) 98.1 F (36.7 C) (Oral)  Wt 276 lb 4 oz (125.306 kg)  SpO2 98%  Wt Readings from Last 3 Encounters:  08/14/13 275 lb 8 oz (124.966 kg)    General: Appears her stated age, obese but well developed, well nourished in NAD. Neck: Normal range of motion. Normal strength with resistance. No tenderness to palpation of the lumbar spine. Neck supple, trachea midline. No massses, lumps or thyromegaly present.  Cardiovascular: Normal rate and rhythm. S1,S2 noted.  No murmur, rubs or gallops noted. No JVD or BLE edema. No carotid bruits noted. Pulmonary/Chest: Normal effort and positive vesicular breath sounds. No respiratory distress. No wheezes, rales  or ronchi noted.    BMET    Component Value Date/Time   NA 140 08/14/2013 1557   K 4.0 08/14/2013 1557   CL 107 08/14/2013 1557   CO2 25 08/14/2013 1557   GLUCOSE 86 08/14/2013 1557   BUN 19 08/14/2013 1557   CREATININE 0.8 08/14/2013 1557   CALCIUM 9.2 08/14/2013 1557   GFRNONAA >60 08/28/2008 0501   GFRAA  Value: >60        The eGFR has been calculated using the MDRD equation. This calculation has not been validated in all clinical situations. eGFR's persistently <60 mL/min signify possible Chronic Kidney Disease. 08/28/2008 0501    Lipid Panel     Component Value Date/Time   CHOL 201* 08/14/2013 1557   TRIG 43.0 08/14/2013 1557   HDL 83.70 08/14/2013 1557   CHOLHDL 2 08/14/2013 1557   VLDL 8.6 08/14/2013 1557    CBC    Component Value Date/Time   WBC 5.5 08/14/2013  1557   RBC 4.17 08/14/2013 1557   HGB 12.7 08/14/2013 1557   HCT 39.7 08/14/2013 1557   PLT 254.0 08/14/2013 1557   MCV 95.3 08/14/2013 1557   MCHC 32.0 08/14/2013 1557   RDW 13.9 08/14/2013 1557   LYMPHSABS 1.7 08/21/2008 1405   MONOABS 0.7 08/21/2008 1405   EOSABS 0.2 08/21/2008 1405   BASOSABS 0.1 08/21/2008 1405    Hgb A1C Lab Results  Component Value Date   HGBA1C 5.6 08/14/2013        Assessment & Plan:   Cervical Radiculopathy:  Normal ROM No pain OK to take Aleve OTC as needed Reassurance that this should resolve over the next few weeks. If no improvement, will consider xray of cervical spine.

## 2013-09-27 NOTE — Patient Instructions (Addendum)

## 2013-10-29 ENCOUNTER — Encounter: Payer: Self-pay | Admitting: Internal Medicine

## 2013-10-29 ENCOUNTER — Ambulatory Visit: Payer: Medicare HMO | Admitting: Internal Medicine

## 2013-10-29 ENCOUNTER — Ambulatory Visit (INDEPENDENT_AMBULATORY_CARE_PROVIDER_SITE_OTHER): Payer: Commercial Managed Care - HMO | Admitting: Internal Medicine

## 2013-10-29 VITALS — BP 130/84 | HR 71 | Temp 97.9°F | Wt 271.0 lb

## 2013-10-29 DIAGNOSIS — Z6841 Body Mass Index (BMI) 40.0 and over, adult: Secondary | ICD-10-CM

## 2013-10-29 DIAGNOSIS — E669 Obesity, unspecified: Secondary | ICD-10-CM

## 2013-10-29 MED ORDER — PHENTERMINE HCL 37.5 MG PO CAPS: 37.5000 mg | ORAL_CAPSULE | ORAL | Status: DC

## 2013-10-29 NOTE — Progress Notes (Signed)
Subjective:    Patient ID: Julia Mcfarland, female    DOB: October 17, 1945, 68 y.o.   MRN: [id number]  HPI  Pt presents to the clinic today for 1 month followup of weight check. She was started on phentermine 1 month ago and educated on diet and exercise. Her weight at that time was 276.25 lbs. Her weight today is 271 lbs. She denies dizziness, lightheadedness or palpitations. She is doing an exercise class 3-4 times per week.  Review of Systems      Past Medical History  Diagnosis Date  . Allergy   . Arthritis   . Chicken pox   . Edema   . Diabetes mellitus without complication     borderline    Current Outpatient Prescriptions  Medication Sig Dispense Refill  . calcium carbonate (OS-CAL) 600 MG TABS tablet Take 600 mg by mouth 3 (three) times daily with meals.      . furosemide (LASIX) 20 MG tablet Take 20 mg by mouth daily.      . phentermine 37.5 MG capsule Take 1 capsule (37.5 mg total) by mouth every morning.  30 capsule  0   No current facility-administered medications for this visit.    No Known Allergies  Family History  Problem Relation Age of Onset  . Arthritis Mother   . Heart disease Mother   . Hypertension Mother   . Diabetes Mother   . Stroke Father   . Hypertension Father   . Stroke Sister   . Hypertension Sister   . Heart disease Maternal Grandfather   . Diabetes Paternal Grandmother   . Heart disease Paternal Grandfather     History   Social History  . Marital Status: Married    Spouse Name: N/A    Number of Children: N/A  . Years of Education: N/A   Occupational History  . Not on file.   Social History Main Topics  . Smoking status: Never Smoker   . Smokeless tobacco: Never Used  . Alcohol Use: Yes     Comment: occasional  . Drug Use: No  . Sexual Activity: No   Other Topics Concern  . Not on file   Social History Narrative  . No narrative on file     Constitutional: Denies fever, malaise, fatigue, headache or abrupt weight  changes.  Respiratory: Denies difficulty breathing, shortness of breath, cough or sputum production.   Cardiovascular: Denies chest pain, chest tightness, palpitations or swelling in the hands or feet.     No other specific complaints in a complete review of systems (except as listed in HPI above).  Objective:   Physical Exam  BP 130/84  Pulse 71  Temp(Src) 97.9 F (36.6 C) (Oral)  Wt 271 lb (122.925 kg)  SpO2 97% Wt Readings from Last 3 Encounters:  10/29/13 271 lb (122.925 kg)  09/27/13 276 lb 4 oz (125.306 kg)  08/14/13 275 lb 8 oz (124.966 kg)    General: Appears her stated age, obese but well developed, well nourished in NAD. Cardiovascular: Normal rate and rhythm. S1,S2 noted.  No murmur, rubs or gallops noted. No JVD or BLE edema. No carotid bruits noted. Pulmonary/Chest: Normal effort and positive vesicular breath sounds. No respiratory distress. No wheezes, rales or ronchi noted.    BMET    Component Value Date/Time   NA 140 08/14/2013 1557   K 4.0 08/14/2013 1557   CL 107 08/14/2013 1557   CO2 25 08/14/2013 1557   GLUCOSE 86 08/14/2013 1557  BUN 19 08/14/2013 1557   CREATININE 0.8 08/14/2013 1557   CALCIUM 9.2 08/14/2013 1557   GFRNONAA >60 08/28/2008 0501   GFRAA  Value: >60        The eGFR has been calculated using the MDRD equation. This calculation has not been validated in all clinical situations. eGFR's persistently <60 mL/min signify possible Chronic Kidney Disease. 08/28/2008 0501    Lipid Panel     Component Value Date/Time   CHOL 201* 08/14/2013 1557   TRIG 43.0 08/14/2013 1557   HDL 83.70 08/14/2013 1557   CHOLHDL 2 08/14/2013 1557   VLDL 8.6 08/14/2013 1557    CBC    Component Value Date/Time   WBC 5.5 08/14/2013 1557   RBC 4.17 08/14/2013 1557   HGB 12.7 08/14/2013 1557   HCT 39.7 08/14/2013 1557   PLT 254.0 08/14/2013 1557   MCV 95.3 08/14/2013 1557   MCHC 32.0 08/14/2013 1557   RDW 13.9 08/14/2013 1557   LYMPHSABS 1.7 08/21/2008 1405   MONOABS 0.7  08/21/2008 1405   EOSABS 0.2 08/21/2008 1405   BASOSABS 0.1 08/21/2008 1405    Hgb A1C Lab Results  Component Value Date   HGBA1C 5.6 08/14/2013         Assessment & Plan:

## 2013-10-29 NOTE — Assessment & Plan Note (Signed)
Printouts given as a sample diet plan- high protein low carb Information given on exercise to lose weight  Phentermine refilled today  RTC in 1 month for weight check/med refill

## 2013-10-29 NOTE — Patient Instructions (Addendum)

## 2013-10-29 NOTE — Progress Notes (Signed)
Pre visit review using our clinic review tool, if applicable. No additional management support is needed unless otherwise documented below in the visit note. 

## 2013-11-28 ENCOUNTER — Ambulatory Visit: Payer: Commercial Managed Care - HMO | Admitting: Internal Medicine

## 2014-08-18 ENCOUNTER — Encounter: Payer: Medicare HMO | Admitting: Internal Medicine

## 2014-08-21 ENCOUNTER — Other Ambulatory Visit (HOSPITAL_COMMUNITY)
Admission: RE | Admit: 2014-08-21 | Discharge: 2014-08-21 | Disposition: A | Discharge status: Home / Self Care | Payer: Commercial Managed Care - HMO | Source: Ambulatory Visit | Attending: Internal Medicine | Admitting: Internal Medicine

## 2014-08-21 ENCOUNTER — Encounter: Payer: Self-pay | Admitting: Internal Medicine

## 2014-08-21 ENCOUNTER — Ambulatory Visit (INDEPENDENT_AMBULATORY_CARE_PROVIDER_SITE_OTHER): Payer: Commercial Managed Care - HMO | Admitting: Internal Medicine

## 2014-08-21 VITALS — BP 122/84 | HR 61 | Temp 98.0°F | Ht 61.0 in | Wt 270.0 lb

## 2014-08-21 DIAGNOSIS — Z124 Encounter for screening for malignant neoplasm of cervix: Secondary | ICD-10-CM | POA: Diagnosis not present

## 2014-08-21 DIAGNOSIS — R600 Localized edema: Secondary | ICD-10-CM | POA: Insufficient documentation

## 2014-08-21 DIAGNOSIS — R7303 Prediabetes: Secondary | ICD-10-CM | POA: Insufficient documentation

## 2014-08-21 DIAGNOSIS — M1990 Unspecified osteoarthritis, unspecified site: Secondary | ICD-10-CM | POA: Insufficient documentation

## 2014-08-21 DIAGNOSIS — I872 Venous insufficiency (chronic) (peripheral): Secondary | ICD-10-CM

## 2014-08-21 DIAGNOSIS — Z23 Encounter for immunization: Secondary | ICD-10-CM

## 2014-08-21 DIAGNOSIS — B3731 Acute candidiasis of vulva and vagina: Secondary | ICD-10-CM

## 2014-08-21 DIAGNOSIS — Z1151 Encounter for screening for human papillomavirus (HPV): Secondary | ICD-10-CM | POA: Insufficient documentation

## 2014-08-21 DIAGNOSIS — B373 Candidiasis of vulva and vagina: Secondary | ICD-10-CM | POA: Diagnosis not present

## 2014-08-21 DIAGNOSIS — Z91048 Other nonmedicinal substance allergy status: Secondary | ICD-10-CM | POA: Diagnosis not present

## 2014-08-21 DIAGNOSIS — R609 Edema, unspecified: Secondary | ICD-10-CM | POA: Diagnosis not present

## 2014-08-21 DIAGNOSIS — M810 Age-related osteoporosis without current pathological fracture: Secondary | ICD-10-CM

## 2014-08-21 DIAGNOSIS — Z0000 Encounter for general adult medical examination without abnormal findings: Secondary | ICD-10-CM | POA: Diagnosis not present

## 2014-08-21 DIAGNOSIS — R7309 Other abnormal glucose: Secondary | ICD-10-CM

## 2014-08-21 DIAGNOSIS — Z9109 Other allergy status, other than to drugs and biological substances: Secondary | ICD-10-CM | POA: Insufficient documentation

## 2014-08-21 LAB — COMPREHENSIVE METABOLIC PANEL
ALBUMIN: 3.8 g/dL (ref 3.5–5.2)
ALK PHOS: 68 U/L (ref 39–117)
ALT: 15 U/L (ref 0–35)
AST: 15 U/L (ref 0–37)
BUN: 19 mg/dL (ref 6–23)
CO2: 29 meq/L (ref 19–32)
Calcium: 9.2 mg/dL (ref 8.4–10.5)
Chloride: 106 mEq/L (ref 96–112)
Creatinine, Ser: 0.87 mg/dL (ref 0.40–1.20)
GFR: 83.4 mL/min (ref >60.00)
GLUCOSE: 112 mg/dL — AB (ref 70–99)
POTASSIUM: 4 meq/L (ref 3.5–5.1)
Sodium: 139 mEq/L (ref 135–145)
Total Bilirubin: 0.6 mg/dL (ref 0.2–1.2)
Total Protein: 6.8 g/dL (ref 6.0–8.3)

## 2014-08-21 LAB — CBC
HEMATOCRIT: 37.7 % (ref 36.0–46.0)
HEMOGLOBIN: 12.7 g/dL (ref 12.0–15.0)
MCHC: 33.8 g/dL (ref 30.0–36.0)
MCV: 91.7 fl (ref 78.0–100.0)
PLATELETS: 245 10*3/uL (ref 150.0–400.0)
RBC: 4.11 Mil/uL (ref 3.87–5.11)
RDW: 13.6 % (ref 11.5–15.5)
WBC: 4.9 10*3/uL (ref 4.0–10.5)

## 2014-08-21 LAB — VITAMIN D 25 HYDROXY (VIT D DEFICIENCY, FRACTURES): VITD: 42.03 ng/mL (ref 30.00–100.00)

## 2014-08-21 LAB — LIPID PANEL
CHOL/HDL RATIO: 3
Cholesterol: 189 mg/dL (ref 0–200)
HDL: 73.2 mg/dL (ref >39.00)
LDL CALC: 105 mg/dL — AB (ref 0–99)
NONHDL: 115.8
TRIGLYCERIDES: 52 mg/dL (ref 0.0–149.0)
VLDL: 10.4 mg/dL (ref 0.0–40.0)

## 2014-08-21 LAB — HEMOGLOBIN A1C: HEMOGLOBIN A1C: 5.7 % (ref 4.6–6.5)

## 2014-08-21 MED ORDER — FLUCONAZOLE 150 MG PO TABS: 150.0000 mg | ORAL_TABLET | Freq: Once | ORAL | Status: DC

## 2014-08-21 MED ORDER — FUROSEMIDE 20 MG PO TABS: 20.0000 mg | ORAL_TABLET | Freq: Every day | ORAL | Status: DC

## 2014-08-21 NOTE — Progress Notes (Signed)
HPI:  Pt presents to the clinic today for her annual wellness visit. She is also here to follow up chronic conditions (see separate note)  Past Medical History  Diagnosis Date  . Allergy   . Arthritis   . Chicken pox   . Edema   . Diabetes mellitus without complication     borderline    Current Outpatient Prescriptions  Medication Sig Dispense Refill  . calcium carbonate (OS-CAL) 600 MG TABS tablet Take 600 mg by mouth 3 (three) times daily with meals.    . furosemide (LASIX) 20 MG tablet Take 20 mg by mouth daily.    . phentermine 37.5 MG capsule Take 1 capsule (37.5 mg total) by mouth every morning. 30 capsule 0   No current facility-administered medications for this visit.    No Known Allergies  Family History  Problem Relation Age of Onset  . Arthritis Mother   . Heart disease Mother   . Hypertension Mother   . Diabetes Mother   . Stroke Father   . Hypertension Father   . Stroke Sister   . Hypertension Sister   . Heart disease Maternal Grandfather   . Diabetes Paternal Grandmother   . Heart disease Paternal Grandfather     History   Social History  . Marital Status: Married    Spouse Name: N/A  . Number of Children: N/A  . Years of Education: N/A   Occupational History  . Not on file.   Social History Main Topics  . Smoking status: Never Smoker   . Smokeless tobacco: Never Used  . Alcohol Use: Yes     Comment: occasional  . Drug Use: No  . Sexual Activity: No   Other Topics Concern  . Not on file   Social History Narrative    Hospitiliaztions: None  Health Maintenance:    Flu: > 5 years ago  Tetanus: 08/14/2013  Pneumovax: never  Prevnar: never  Zostavax: never  Pap Smear: more than 2 years  Mammogram: Scheduled 08/22/14  Bone Density: more than 2 years ago  Colonoscopy: 2010 (5 years), has [facility] 10/2014 [facility] Doctor:  04/2014  Dental Exam: Biannually  Providers:   PCP: Webb Silversmith, NP-C  GI: Dr.  Patty Sermons  Opthomologist: Dr. Verdis Prime  I have personally reviewed and have noted:  1. The patient's medical and social history 2. Their use of alcohol, tobacco or illicit drugs 3. Their current medications and supplements 4. The patient's functional ability including ADL's, fall risks, home safety  risks and hearing or visual impairment. 5. Diet and physical activities 6. Evidence for depression or mood disorder  Subjective:   Review of Systems:   Constitutional: Denies fever, malaise, fatigue, headache or abrupt weight changes.  HEENT: Denies eye pain, eye redness, ear pain, ringing in the ears, wax buildup, runny nose, nasal congestion, bloody nose, or sore throat. Respiratory: Denies difficulty breathing, shortness of breath, cough or sputum production.   Cardiovascular: Denies chest pain, chest tightness, palpitations or swelling in the hands or feet.  Gastrointestinal: Denies abdominal pain, bloating, constipation, diarrhea or blood in the stool.  GU: Pt reports frequency and vaginal itching. Denies urgency, pain with urination, burning sensation, blood in urine, odor or discharge. Musculoskeletal: Pt reports bilateral knee pain. Denies decrease in range of motion, difficulty with gait, muscle pain or joint swelling.  Skin: Pt reports discoloration of lower legs. Denies redness, rashes, lesions or ulcercations.  Neurological: Denies dizziness, difficulty with memory, difficulty with speech  or problems with balance and coordination.  Psych: Pt denies anxiety, depression, SI/HI.  No other specific complaints in a complete review of systems (except as listed in HPI above).  Objective:  PE:   BP 122/84 mmHg  Pulse 61  Temp(Src) 98 F (36.7 C) (Oral)  Ht $R'5\' 1"'Su$  (1.549 m)  Wt 270 lb (122.471 kg)  BMI 51.04 kg/m2  SpO2 98%  Wt Readings from Last 3 Encounters:  10/29/13 271 lb (122.925 kg)  09/27/13 276 lb 4 oz (125.306 kg)  08/14/13 275 lb 8 oz (124.966 kg)     General: Appears her stated age, obese but in NAD. Cardiovascular: Normal rate and rhythm. S1,S2 noted.  No murmur, rubs or gallops noted.  Pulmonary/Chest: Normal effort and positive vesicular breath sounds. No respiratory distress. No wheezes, rales or ronchi noted.  Neurological: Alert and oriented. Does seem to have trouble with recall of names and dates. Psychiatric: Mood and affect normal. Behavior is normal. Judgment and thought content normal.    BMET    Component Value Date/Time   NA 140 08/14/2013 1557   K 4.0 08/14/2013 1557   CL 107 08/14/2013 1557   CO2 25 08/14/2013 1557   GLUCOSE 86 08/14/2013 1557   BUN 19 08/14/2013 1557   CREATININE 0.8 08/14/2013 1557   CALCIUM 9.2 08/14/2013 1557   GFRNONAA >60 08/28/2008 0501   GFRAA  08/28/2008 0501    >60        The eGFR has been calculated using the MDRD equation. This calculation has not been validated in all clinical situations. eGFR's persistently <60 mL/min signify possible Chronic Kidney Disease.    Lipid Panel     Component Value Date/Time   CHOL 201* 08/14/2013 1557   TRIG 43.0 08/14/2013 1557   HDL 83.70 08/14/2013 1557   CHOLHDL 2 08/14/2013 1557   VLDL 8.6 08/14/2013 1557    CBC    Component Value Date/Time   WBC 5.5 08/14/2013 1557   RBC 4.17 08/14/2013 1557   HGB 12.7 08/14/2013 1557   HCT 39.7 08/14/2013 1557   PLT 254.0 08/14/2013 1557   MCV 95.3 08/14/2013 1557   MCHC 32.0 08/14/2013 1557   RDW 13.9 08/14/2013 1557   LYMPHSABS 1.7 08/21/2008 1405   MONOABS 0.7 08/21/2008 1405   EOSABS 0.2 08/21/2008 1405   BASOSABS 0.1 08/21/2008 1405    Hgb A1C Lab Results  Component Value Date   HGBA1C 5.6 08/14/2013      Assessment and Plan:   Medicare Annual Wellness Visit:  Diet: Does not adhere to any specific diet plan Physical activity: Sedentary Depression/mood screen: Negative Hearing: Intact to whispered voice Visual acuity: Grossly normal, performs annual eye  exam-cataracts ADLs: Capable Fall risk: None Home safety: Good Cognitive evaluation: Intact to orientation, naming, recall and repetition EOL planning: No adv directives (she will consider this), full code/ I agree  Preventative Medicine:  Prevnar today, will get Pneumovax at next visit Pelvic exam with Pap (vaginal wall only, no cervix) Will schedule bone density-see [place] to schedule She has Mammogram and Colonsocopy scheduled She will call insurance company to find out about shingles vaccine She declines flu vaccine today  Next appointment: 1 year for wellness/follow up    HPI:  Pt presents to the clinic today to follow up chronic medical conditions.  Allergies: Seasonal. Does not take anything OTC daily. Will take an antihistamine OTC when symptoms occur.  Arthritis: Mostly in her knees. S/P right TKR. Exacerbated by weight. She has  been motivated to lose weight by has not started any diet or exercise program. Takes Aleve OTC when needed.  Obesity: Weight has been stable over the last year. BMI of 51. She was on Phentermine for a short period of time but did not lose a significant amount of weight. She has been motivated to lose weight by has not started any diet or exercise program.   Borderline DM 2: Does not check her sugars. Is non compliant with diet or exercise. Does reports increase in urinary frequency. Denies blurred vision, increased thirst, or numbness and tingling in extremities.  Osteoporosis: No recent falls. Has not had a bone density in the last 2 years. Does take a Vit D and calcium supplement.  Past Medical History  Diagnosis Date  . Allergy   . Arthritis   . Chicken pox   . Edema   . Diabetes mellitus without complication     borderline    Current Outpatient Prescriptions  Medication Sig Dispense Refill  . calcium carbonate (OS-CAL) 600 MG TABS tablet Take 600 mg by mouth 3 (three) times daily with meals.    . furosemide (LASIX) 20 MG tablet Take  20 mg by mouth daily.    . phentermine 37.5 MG capsule Take 1 capsule (37.5 mg total) by mouth every morning. 30 capsule 0   No current facility-administered medications for this visit.    No Known Allergies  Family History  Problem Relation Age of Onset  . Arthritis Mother   . Heart disease Mother   . Hypertension Mother   . Diabetes Mother   . Stroke Father   . Hypertension Father   . Stroke Sister   . Hypertension Sister   . Heart disease Maternal Grandfather   . Diabetes Paternal Grandmother   . Heart disease Paternal Grandfather     History   Social History  . Marital Status: Married    Spouse Name: N/A  . Number of Children: N/A  . Years of Education: N/A   Occupational History  . Not on file.   Social History Main Topics  . Smoking status: Never Smoker   . Smokeless tobacco: Never Used  . Alcohol Use: Yes     Comment: occasional  . Drug Use: No  . Sexual Activity: No   Other Topics Concern  . Not on file   Social History Narrative   Subjective:   Review of Systems:   Constitutional: Denies fever, malaise, fatigue, headache or abrupt weight changes.  HEENT: Denies eye pain, eye redness, ear pain, ringing in the ears, wax buildup, runny nose, nasal congestion, bloody nose, or sore throat. Respiratory: Denies difficulty breathing, shortness of breath, cough or sputum production.   Cardiovascular: Pt reports swelling in the feet. Denies chest pain, chest tightness, palpitations or swelling in the hands. Gastrointestinal: Denies abdominal pain, bloating, constipation, diarrhea or blood in the stool.  GU: Pt reports frequency and vaginal itching. Denies urgency, pain with urination, burning sensation, blood in urine, odor or discharge. Musculoskeletal: Pt reports bilateral knee pain. Denies decrease in range of motion, difficulty with gait, muscle pain or joint swelling.  Skin: Pt reports discoloration of BLE. Denies redness, rashes, lesions or ulcercations.   Neurological: Denies dizziness, difficulty with memory, difficulty with speech or problems with balance and coordination.  Psych: Pt denies anxiety, depression, SI/HI.  No other specific complaints in a complete review of systems (except as listed in HPI above).  Objective:  PE:   BP 122/84 mmHg  Pulse 61  Temp(Src) 98 F (36.7 C) (Oral)  Ht _0  (1.549 m)  Wt 270 lb (122.471 kg)  BMI 51.04 kg/m2  SpO2 98%  Wt Readings from Last 3 Encounters:  10/29/13 271 lb (122.925 kg)  09/27/13 276 lb 4 oz (125.306 kg)  08/14/13 275 lb 8 oz (124.966 kg)    General: Appears her stated age,obese but in NAD. Skin: Warm, dry and intact. Thickening of BLE with discoloration. HEENT: Head: normal shape and size; Eyes: sclera white, no icterus, conjunctiva pink, PERRLA and EOMs intact; Ears: Tm's gray and intact, normal light reflex;  Cardiovascular: Normal rate and rhythm. S1,S2 noted.  No murmur, rubs or gallops noted. No JVD. Trace BLE edema. No carotid bruits noted. Pulmonary/Chest: Normal effort and positive vesicular breath sounds. No respiratory distress. No wheezes, rales or ronchi noted.  Abdomen: Soft and nontender. Normal bowel sounds, no bruits noted. No distention or masses noted. Liver, spleen and kidneys non palpable. Musculoskeletal: Normal flexion and extension of bilateral knees. Some Crepitus noted with ROM. Large incision to right knee healed. No signs of joint swelling. No difficulty with gait.  Neurological: Alert and oriented.  Psychiatric: Mood and affect normal. Behavior is normal. Judgment and thought content normal.     BMET    Component Value Date/Time   NA 140 08/14/2013 1557   K 4.0 08/14/2013 1557   CL 107 08/14/2013 1557   CO2 25 08/14/2013 1557   GLUCOSE 86 08/14/2013 1557   BUN 19 08/14/2013 1557   CREATININE 0.8 08/14/2013 1557   CALCIUM 9.2 08/14/2013 1557   GFRNONAA >60 08/28/2008 0501   GFRAA  08/28/2008 0501    >60        The eGFR has been  calculated using the MDRD equation. This calculation has not been validated in all clinical situations. eGFR's persistently <60 mL/min signify possible Chronic Kidney Disease.    Lipid Panel     Component Value Date/Time   CHOL 201* 08/14/2013 1557   TRIG 43.0 08/14/2013 1557   HDL 83.70 08/14/2013 1557   CHOLHDL 2 08/14/2013 1557   VLDL 8.6 08/14/2013 1557    CBC    Component Value Date/Time   WBC 5.5 08/14/2013 1557   RBC 4.17 08/14/2013 1557   HGB 12.7 08/14/2013 1557   HCT 39.7 08/14/2013 1557   PLT 254.0 08/14/2013 1557   MCV 95.3 08/14/2013 1557   MCHC 32.0 08/14/2013 1557   RDW 13.9 08/14/2013 1557   LYMPHSABS 1.7 08/21/2008 1405   MONOABS 0.7 08/21/2008 1405   EOSABS 0.2 08/21/2008 1405   BASOSABS 0.1 08/21/2008 1405    Hgb A1C Lab Results  Component Value Date   HGBA1C 5.6 08/14/2013      Assessment and Plan:   Candida Vaginitis:  Wet prep: + yeast eRx for Diflucan 150 mg PO x 1  RTC in  1 year for medicare wellness/followup

## 2014-08-21 NOTE — Addendum Note (Signed)
Addended by: Lurlean Nanny on: 08/21/2014 05:02 PM   Modules accepted: Orders

## 2014-08-21 NOTE — Assessment & Plan Note (Signed)
In knees Advised her weight loss would likely improve her pain Consider water aerobics to take some pressure off the joints Continue Aleve as needed

## 2014-08-21 NOTE — Patient Instructions (Signed)

## 2014-08-21 NOTE — Progress Notes (Signed)
Pre visit review using our clinic review tool, if applicable. No additional management support is needed unless otherwise documented below in the visit note. 

## 2014-08-21 NOTE — Assessment & Plan Note (Signed)
Seasonal No intervention needed at this time

## 2014-08-21 NOTE — Assessment & Plan Note (Addendum)
Lasix typically does not help She insist that it does, will refill today Encouraged her to try compression stockings- she declines Will check CMET as well

## 2014-08-21 NOTE — Assessment & Plan Note (Addendum)
She does not check sugars Non compliant with diet and exercise- encouraged her to work on this She declines flu shot Prevnar today Will get Pneumovax at next visit Will check A1C today and lipid profile today

## 2014-08-21 NOTE — Assessment & Plan Note (Signed)
Non compliant with diet and exercise Encouraged her to work on this

## 2014-08-21 NOTE — Assessment & Plan Note (Signed)
No recent falls See Julia Mcfarland on the way out to set up your bone density exam Continue calcium and Vit D supplement for now Will check Vit D level today

## 2014-08-22 DIAGNOSIS — Z1231 Encounter for screening mammogram for malignant neoplasm of breast: Secondary | ICD-10-CM | POA: Diagnosis not present

## 2014-08-22 LAB — HM MAMMOGRAPHY: HM Mammogram: NORMAL

## 2014-08-25 LAB — CYTOLOGY - PAP

## 2014-09-05 ENCOUNTER — Encounter: Payer: Self-pay | Admitting: Internal Medicine

## 2014-09-09 ENCOUNTER — Ambulatory Visit (INDEPENDENT_AMBULATORY_CARE_PROVIDER_SITE_OTHER)
Admission: RE | Admit: 2014-09-09 | Discharge: 2014-09-09 | Disposition: A | Discharge status: Home / Self Care | Payer: Commercial Managed Care - HMO | Source: Ambulatory Visit

## 2014-09-09 DIAGNOSIS — M810 Age-related osteoporosis without current pathological fracture: Secondary | ICD-10-CM

## 2014-10-09 ENCOUNTER — Other Ambulatory Visit: Payer: Self-pay | Admitting: Gastroenterology

## 2014-10-09 DIAGNOSIS — D126 Benign neoplasm of colon, unspecified: Secondary | ICD-10-CM | POA: Diagnosis not present

## 2014-10-09 DIAGNOSIS — Z1211 Encounter for screening for malignant neoplasm of colon: Secondary | ICD-10-CM | POA: Diagnosis not present

## 2014-10-09 DIAGNOSIS — D124 Benign neoplasm of descending colon: Secondary | ICD-10-CM | POA: Diagnosis not present

## 2014-10-09 DIAGNOSIS — D123 Benign neoplasm of transverse colon: Secondary | ICD-10-CM | POA: Diagnosis not present

## 2014-10-09 DIAGNOSIS — Z86010 Personal history of colonic polyps: Secondary | ICD-10-CM | POA: Diagnosis not present

## 2014-10-09 DIAGNOSIS — Z09 Encounter for follow-up examination after completed treatment for conditions other than malignant neoplasm: Secondary | ICD-10-CM | POA: Diagnosis not present

## 2014-10-09 DIAGNOSIS — D125 Benign neoplasm of sigmoid colon: Secondary | ICD-10-CM | POA: Diagnosis not present

## 2014-10-09 DIAGNOSIS — K635 Polyp of colon: Secondary | ICD-10-CM | POA: Diagnosis not present

## 2014-10-09 DIAGNOSIS — K5730 Diverticulosis of large intestine without perforation or abscess without bleeding: Secondary | ICD-10-CM | POA: Diagnosis not present

## 2014-10-28 ENCOUNTER — Encounter: Payer: Self-pay | Admitting: Internal Medicine

## 2014-11-20 DIAGNOSIS — H35363 Drusen (degenerative) of macula, bilateral: Secondary | ICD-10-CM | POA: Diagnosis not present

## 2014-11-20 DIAGNOSIS — H43393 Other vitreous opacities, bilateral: Secondary | ICD-10-CM | POA: Diagnosis not present

## 2014-11-20 DIAGNOSIS — H40013 Open angle with borderline findings, low risk, bilateral: Secondary | ICD-10-CM | POA: Diagnosis not present

## 2014-11-20 DIAGNOSIS — H2513 Age-related nuclear cataract, bilateral: Secondary | ICD-10-CM | POA: Diagnosis not present

## 2015-12-04 ENCOUNTER — Telehealth: Payer: Self-pay

## 2015-12-04 NOTE — Telephone Encounter (Signed)
She has not been seen in > 1 year. This can be filled out when she makes a follow up appt

## 2015-12-04 NOTE — Telephone Encounter (Addendum)
Pt left v/m; pts handicap placard is going to expire and pt request form for new handicap placard. Pt last seen annual exam on 08/21/14. Pt has medicare wellness exam scheduled for 12/22/15 with Lattie Haw. Copy of application form in [facility] NP in box.

## 2015-12-08 NOTE — Telephone Encounter (Signed)
Pt has appt with [facility] 12/23/15

## 2015-12-22 ENCOUNTER — Ambulatory Visit: Payer: Commercial Managed Care - HMO

## 2015-12-23 ENCOUNTER — Other Ambulatory Visit: Payer: Self-pay | Admitting: Internal Medicine

## 2015-12-23 ENCOUNTER — Encounter: Payer: Self-pay | Admitting: Internal Medicine

## 2015-12-23 ENCOUNTER — Ambulatory Visit (INDEPENDENT_AMBULATORY_CARE_PROVIDER_SITE_OTHER): Payer: Commercial Managed Care - HMO | Admitting: Internal Medicine

## 2015-12-23 VITALS — BP 130/80 | HR 71 | Temp 98.6°F | Ht 61.0 in | Wt 272.0 lb

## 2015-12-23 DIAGNOSIS — R7303 Prediabetes: Secondary | ICD-10-CM

## 2015-12-23 DIAGNOSIS — R609 Edema, unspecified: Secondary | ICD-10-CM | POA: Diagnosis not present

## 2015-12-23 DIAGNOSIS — Z1159 Encounter for screening for other viral diseases: Secondary | ICD-10-CM | POA: Diagnosis not present

## 2015-12-23 DIAGNOSIS — Z23 Encounter for immunization: Secondary | ICD-10-CM | POA: Diagnosis not present

## 2015-12-23 DIAGNOSIS — M1990 Unspecified osteoarthritis, unspecified site: Secondary | ICD-10-CM

## 2015-12-23 DIAGNOSIS — Z0000 Encounter for general adult medical examination without abnormal findings: Secondary | ICD-10-CM | POA: Diagnosis not present

## 2015-12-23 DIAGNOSIS — M810 Age-related osteoporosis without current pathological fracture: Secondary | ICD-10-CM

## 2015-12-23 DIAGNOSIS — Z9109 Other allergy status, other than to drugs and biological substances: Secondary | ICD-10-CM

## 2015-12-23 DIAGNOSIS — Z114 Encounter for screening for human immunodeficiency virus [HIV]: Secondary | ICD-10-CM | POA: Diagnosis not present

## 2015-12-23 DIAGNOSIS — Z6841 Body Mass Index (BMI) 40.0 and over, adult: Secondary | ICD-10-CM

## 2015-12-23 DIAGNOSIS — Z91048 Other nonmedicinal substance allergy status: Secondary | ICD-10-CM

## 2015-12-23 LAB — LIPID PANEL
CHOL/HDL RATIO: 3
Cholesterol: 202 mg/dL — ABNORMAL HIGH (ref 0–200)
HDL: 73.3 mg/dL (ref >39.00)
LDL Cholesterol: 114 mg/dL — ABNORMAL HIGH (ref 0–99)
NONHDL: 128.94
Triglycerides: 76 mg/dL (ref 0.0–149.0)
VLDL: 15.2 mg/dL (ref 0.0–40.0)

## 2015-12-23 LAB — VITAMIN D 25 HYDROXY (VIT D DEFICIENCY, FRACTURES): VITD: 41.75 ng/mL (ref 30.00–100.00)

## 2015-12-23 LAB — CBC
HEMATOCRIT: 38.9 % (ref 36.0–46.0)
HEMOGLOBIN: 12.9 g/dL (ref 12.0–15.0)
MCHC: 33 g/dL (ref 30.0–36.0)
MCV: 92.2 fl (ref 78.0–100.0)
Platelets: 243 10*3/uL (ref 150.0–400.0)
RBC: 4.22 Mil/uL (ref 3.87–5.11)
RDW: 13.8 % (ref 11.5–15.5)
WBC: 5.4 10*3/uL (ref 4.0–10.5)

## 2015-12-23 LAB — COMPREHENSIVE METABOLIC PANEL
ALT: 15 U/L (ref 0–35)
AST: 16 U/L (ref 0–37)
Albumin: 4 g/dL (ref 3.5–5.2)
Alkaline Phosphatase: 65 U/L (ref 39–117)
BUN: 16 mg/dL (ref 6–23)
CHLORIDE: 109 meq/L (ref 96–112)
CO2: 27 meq/L (ref 19–32)
Calcium: 9.3 mg/dL (ref 8.4–10.5)
Creatinine, Ser: 0.84 mg/dL (ref 0.40–1.20)
GFR: 86.5 mL/min (ref >60.00)
GLUCOSE: 88 mg/dL (ref 70–99)
POTASSIUM: 3.5 meq/L (ref 3.5–5.1)
Sodium: 140 mEq/L (ref 135–145)
Total Bilirubin: 0.5 mg/dL (ref 0.2–1.2)
Total Protein: 7 g/dL (ref 6.0–8.3)

## 2015-12-23 LAB — HEMOGLOBIN A1C: Hgb A1c MFr Bld: 5.5 % (ref 4.6–6.5)

## 2015-12-23 NOTE — Addendum Note (Signed)
Addended by: Marchia Bond on: 12/23/2015 04:39 PM   Modules accepted: Miquel Dunn

## 2015-12-23 NOTE — Assessment & Plan Note (Signed)
Take a antihistamine OTC prn

## 2015-12-23 NOTE — Progress Notes (Signed)
Pre visit review using our clinic review tool, if applicable. No additional management support is needed unless otherwise documented below in the visit note. 

## 2015-12-23 NOTE — Assessment & Plan Note (Addendum)
Encouraged weight loss Continue Aleve as needed Handicap placard filled out Call your orthopedist if this gets worse to discuss left knee replacement

## 2015-12-23 NOTE — Assessment & Plan Note (Signed)
Encouraged her to work on diet and exercise She would be a good candidate for bariatric surgery

## 2015-12-23 NOTE — Progress Notes (Signed)
HPI:  Pt presents to the clinic today for her annual wellness visit. She is also here to follow up chronic conditions  Allergies: Seasonal. Does not take anything OTC daily. Will take an antihistamine OTC when symptoms occur.  Arthritis: Mostly in her knees. S/P right TKR. Exacerbated by weight. Takes Aleve OTC when needed.  Obesity: Weight has been stable over the last year. BMI of 51.42. She does not work on diet or exercise. She has failed Phentermine in the past.  Prediabetes: Does not check her sugars. Is non compliant with diet or exercise. Denies blurred vision, increased thirst, or numbness in extremities.  Osteoporosis: No recent falls. Has not had a bone density in the last 2 years. Does take a Vit D and calcium supplement.  Edema: Mainly in her lower legs. She takes Lasix prn.  Past Medical History  Diagnosis Date  . Allergy   . Arthritis   . Chicken pox   . Edema   . Diabetes mellitus without complication     borderline    Current Outpatient Prescriptions  Medication Sig Dispense Refill  . calcium carbonate (OS-CAL) 600 MG TABS tablet Take 600 mg by mouth 3 (three) times daily with meals.    . Cholecalciferol (VITAMIN D3) 5000 UNITS CAPS Take 1 capsule by mouth daily.    . fluconazole (DIFLUCAN) 150 MG tablet Take 1 tablet (150 mg total) by mouth once. 1 tablet 0  . furosemide (LASIX) 20 MG tablet Take 1 tablet (20 mg total) by mouth daily. 30 tablet 2  . vitamin C (ASCORBIC ACID) 500 MG tablet Take 500 mg by mouth daily.     No current facility-administered medications for this visit.    No Known Allergies  Family History  Problem Relation Age of Onset  . Arthritis Mother   . Heart disease Mother   . Hypertension Mother   . Diabetes Mother   . Stroke Father   . Hypertension Father   . Stroke Sister   . Hypertension Sister   . Heart disease Maternal Grandfather   . Diabetes Paternal Grandmother   . Heart disease Paternal Grandfather     Social History    Social History  . Marital Status: Married    Spouse Name: N/A  . Number of Children: N/A  . Years of Education: N/A   Occupational History  . Not on file.   Social History Main Topics  . Smoking status: Never Smoker   . Smokeless tobacco: Never Used  . Alcohol Use: Yes     Comment: occasional  . Drug Use: No  . Sexual Activity: No   Other Topics Concern  . Not on file   Social History Narrative    Hospitiliaztions: None  Health Maintenance:    Flu: > 5 years ago  Tetanus: 08/14/2013  Pneumovax: never  Prevnar: 08/11/2014  Zostavax: never  Pap Smear: 08/2014  Mammogram: 08/22/14  Bone Density: 09/2014  Colonoscopy: 10/2014 (5 years)  Eye Doctor:  04/2014  Dental Exam: Biannually  Providers:   PCP: Webb Silversmith, NP-C  GI: Dr. Patty Sermons  Opthomologist: Dr. Verdis Prime  I have personally reviewed and have noted:  1. The patient's medical and social history 2. Their use of alcohol, tobacco or illicit drugs 3. Their current medications and supplements 4. The patient's functional ability including ADL's, fall risks, home  safety risks and hearing or visual impairment. 5. Diet and physical activities 6. Evidence for depression or mood disorder  Subjective:   Review of Systems:  Constitutional: Denies fever, malaise, fatigue, headache or abrupt weight changes.  HEENT: Denies eye pain, eye redness, ear pain, ringing in the ears, wax buildup, runny nose, nasal congestion, bloody nose, or sore throat. Respiratory: Denies difficulty breathing, shortness of breath, cough or sputum production.   Cardiovascular: Pt reports swelling in her feet. Denies chest pain, chest tightness, palpitations or swelling in the hands.  Gastrointestinal: Denies abdominal pain, bloating, constipation, diarrhea or blood in the stool.  GU: Denies urgency, pain with urination, burning sensation, blood in urine, odor or discharge. Musculoskeletal: Pt reports bilateral knee pain. Denies  decrease in range of motion, difficulty with gait, muscle pain or joint swelling.  Skin: Denies redness, rashes, lesions or ulcercations.  Neurological: Pt reports tingling in her feet. Denies dizziness, difficulty with memory, difficulty with speech or problems with balance and coordination.  Psych: Pt denies anxiety, depression, SI/HI.  No other specific complaints in a complete review of systems (except as listed in HPI above).  Objective:  PE:   BP 130/80 mmHg  Pulse 71  Temp(Src) 98.6 F (37 C) (Oral)  Ht _0  (1.549 m)  Wt 272 lb (123.378 kg)  BMI 51.42 kg/m2  SpO2 97%   Wt Readings from Last 3 Encounters:  08/21/14 270 lb (122.471 kg)  10/29/13 271 lb (122.925 kg)  09/27/13 276 lb 4 oz (125.306 kg)    General: Appears her stated age, obese but in NAD. Cardiovascular: Normal rate and rhythm. S1,S2 noted.  No murmur, rubs or gallops noted. 1-2+ edema in BLE. Pulmonary/Chest: Normal effort and positive vesicular breath sounds. No respiratory distress. No wheezes, rales or ronchi noted.  Neurological: Alert and oriented. Does seem to have trouble with recall of names and dates. Psychiatric: Mood and affect normal. Behavior is normal. Judgment and thought content normal.    BMET    Component Value Date/Time   NA 139 08/21/2014 0924   K 4.0 08/21/2014 0924   CL 106 08/21/2014 0924   CO2 29 08/21/2014 0924   GLUCOSE 112* 08/21/2014 0924   BUN 19 08/21/2014 0924   CREATININE 0.87 08/21/2014 0924   CALCIUM 9.2 08/21/2014 0924   GFRNONAA >60 08/28/2008 0501   GFRAA  08/28/2008 0501    >60        The eGFR has been calculated using the MDRD equation. This calculation has not been validated in all clinical situations. eGFR's persistently <60 mL/min signify possible Chronic Kidney Disease.    Lipid Panel     Component Value Date/Time   CHOL 189 08/21/2014 0924   TRIG 52.0 08/21/2014 0924   HDL 73.20 08/21/2014 0924   CHOLHDL 3 08/21/2014 0924   VLDL 10.4  08/21/2014 0924   LDLCALC 105* 08/21/2014 0924    CBC    Component Value Date/Time   WBC 4.9 08/21/2014 0924   RBC 4.11 08/21/2014 0924   HGB 12.7 08/21/2014 0924   HCT 37.7 08/21/2014 0924   PLT 245.0 08/21/2014 0924   MCV 91.7 08/21/2014 0924   MCHC 33.8 08/21/2014 0924   RDW 13.6 08/21/2014 0924   LYMPHSABS 1.7 08/21/2008 1405   MONOABS 0.7 08/21/2008 1405   EOSABS 0.2 08/21/2008 1405   BASOSABS 0.1 08/21/2008 1405    Hgb A1C Lab Results  Component Value Date   HGBA1C 5.7 08/21/2014      Assessment and Plan:   Medicare Annual Wellness Visit:  Diet: Does not adhere to any specific diet plan, but trying to cut back on portions Physical activity: She  goes to [facility] 2-3 days per week. Depression/mood screen: Negative Hearing: Intact to whispered voice Visual acuity: Grossly normal, performs annual eye exam-cataracts ADLs: Capable Fall risk: None Home safety: Good Cognitive evaluation: Intact to orientation, naming, recall and repetition EOL planning: No adv directives (she will consider this), full code/ I agree  Preventative Medicine:  Encouraged her to get a flu shot in the fall Pneumovax today Prevnar UTD Will discuss Zostovax at next visit Pap, mammogram and bone density UTD Encouraged her to work on diet and exercise Encouraged he to see an eye doctor and dentist at least annually  Next appointment: 1 year for wellness/follow up

## 2015-12-23 NOTE — Addendum Note (Signed)
Addended by: Lurlean Nanny on: 12/23/2015 04:23 PM   Modules accepted: Orders, SmartSet

## 2015-12-23 NOTE — Patient Instructions (Signed)

## 2015-12-23 NOTE — Assessment & Plan Note (Addendum)
Deteriorated Encouraged weight loss Start taking Lasix daily

## 2015-12-23 NOTE — Assessment & Plan Note (Signed)
Will repeat bone density in 2018 Continue Calcium and Vit D supplement

## 2015-12-23 NOTE — Assessment & Plan Note (Signed)
Encouraged her to consume a low carb diet and exercise to lose weight A1C today

## 2015-12-24 LAB — HIV ANTIBODY (ROUTINE TESTING W REFLEX): HIV 1&2 Ab, 4th Generation: NONREACTIVE

## 2015-12-24 LAB — HEPATITIS C ANTIBODY: HCV AB: NEGATIVE

## 2016-01-26 DIAGNOSIS — H25011 Cortical age-related cataract, right eye: Secondary | ICD-10-CM | POA: Diagnosis not present

## 2016-01-26 DIAGNOSIS — H2513 Age-related nuclear cataract, bilateral: Secondary | ICD-10-CM | POA: Diagnosis not present

## 2016-01-26 DIAGNOSIS — H40013 Open angle with borderline findings, low risk, bilateral: Secondary | ICD-10-CM | POA: Diagnosis not present

## 2016-01-26 DIAGNOSIS — H35363 Drusen (degenerative) of macula, bilateral: Secondary | ICD-10-CM | POA: Diagnosis not present

## 2016-02-02 ENCOUNTER — Other Ambulatory Visit: Payer: Self-pay

## 2016-02-02 MED ORDER — FUROSEMIDE 20 MG PO TABS: 20.0000 mg | ORAL_TABLET | Freq: Every day | ORAL | 3 refills | Status: DC

## 2016-02-02 NOTE — Telephone Encounter (Signed)
Pt last annual exam on 12/23/15; pt request 90 day refill furosemide 20 mg to [facility]. Refilled per protocol and pt voiced understanding.

## 2016-06-01 ENCOUNTER — Encounter: Payer: Self-pay | Admitting: Internal Medicine

## 2016-06-01 DIAGNOSIS — Z1231 Encounter for screening mammogram for malignant neoplasm of breast: Secondary | ICD-10-CM | POA: Diagnosis not present

## 2016-06-01 LAB — HM MAMMOGRAPHY

## 2016-06-02 ENCOUNTER — Encounter: Payer: Self-pay | Admitting: Internal Medicine

## 2016-10-19 ENCOUNTER — Ambulatory Visit (INDEPENDENT_AMBULATORY_CARE_PROVIDER_SITE_OTHER): Payer: Self-pay | Admitting: Orthopedic Surgery

## 2016-12-02 ENCOUNTER — Ambulatory Visit (INDEPENDENT_AMBULATORY_CARE_PROVIDER_SITE_OTHER): Payer: Medicare HMO | Admitting: Orthopedic Surgery

## 2016-12-02 ENCOUNTER — Encounter (INDEPENDENT_AMBULATORY_CARE_PROVIDER_SITE_OTHER): Payer: Self-pay | Admitting: Orthopedic Surgery

## 2016-12-02 ENCOUNTER — Ambulatory Visit (INDEPENDENT_AMBULATORY_CARE_PROVIDER_SITE_OTHER): Payer: Medicare HMO

## 2016-12-02 VITALS — Ht 62.0 in | Wt 265.0 lb

## 2016-12-02 DIAGNOSIS — M25562 Pain in left knee: Secondary | ICD-10-CM

## 2016-12-02 DIAGNOSIS — G8929 Other chronic pain: Secondary | ICD-10-CM

## 2016-12-02 NOTE — Progress Notes (Signed)
Office Visit Note   [name]           Date of Birth: 1945-08-01           MRN: [id number] Visit Date: 12/02/2016 Requested by: Julia Fenton, NP Julia Mcfarland, Julia Mcfarland PCP: Julia Fenton, NP  Subjective: Chief Complaint  Patient presents with  . Left Knee - Pain    HPI: Julia Mcfarland is a 71 year old patient with left knee pain.  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  She now describes significant left knee pain including pain with ambulation pain with weightbearing as well as some night pain and rest pain.  She's going on a cruise with her husband in August.  She would like to have intervention on the left knee after that cruise.  She is otherwise healthy.  She plans to see the dentist prior to this knee replacement.  She denies any recent cardiac or high blood pressure issues.              ROS: All systems reviewed are negative as they relate to the chief complaint within the history of present illness.  Patient denies  fevers or chills.   Assessment & Plan: Visit Diagnoses:  1. Chronic pain of left knee     Plan: Impression is endstage left knee arthritis.  Plan left knee replacement.  Risks benefits discussed including not limited to infection or vessel damage stiffness incomplete pain relief.  All questions answered.  Patient is currently using a cane.  She's been through this before the right knee and so she knows what to expect in terms of postoperative rehabilitation.  Follow-Up Instructions: No Follow-up on file.   Orders:  Orders Placed This Encounter  Procedures  . XR Knee 1-2 Views Left   No orders of the defined types were placed in this encounter.     Procedures: No procedures performed   Clinical Data: No additional findings.  Objective: Vital Signs: Ht 5\' 2"  (1.575 m)   Wt 265 lb (120.2 kg)   BMI 48.47 kg/m   Physical Exam:   Constitutional: Patient appears  well-developed HEENT:  Head: Normocephalic Eyes:EOM are normal Neck: Normal range of motion Cardiovascular: Normal rate Pulmonary/chest: Effort normal Neurologic: Patient is alert Skin: Skin is warm Psychiatric: Patient has normal mood and affect    Ortho Exam: Orthopedic exam demonstrates reasonable range of motion of the left knee from full extension to about 90 of flexion.  There is mild effusion.  Pedal pulses are palpable.  There is no groin pain with internal and rotation of the leg.  Extensor mechanism is intact.  Skin is intact.  Collaterals are stable  Specialty Comments:  No specialty comments available.  Imaging: Xr Knee 1-2 Views Left  Result Date: 12/02/2016 AP lateral left knee reviewed.  End-stage tricompartment arthritis is present worse on the medial side.  Spurring is noted.  Mild varus alignment is present.  Soft tissue envelope is significant.  No other soft tissue calcifications present.    PMFS History: Patient Active Problem List   Diagnosis Date Noted  . Environmental allergies 08/21/2014  . Arthritis 08/21/2014  . Prediabetes 08/21/2014  . Peripheral edema 08/21/2014  . Osteoporosis 08/14/2013  . Morbid obesity with BMI of 50.0-59.9, adult (Julia Mcfarland) 08/14/2013   Past Medical History:  Diagnosis Date  . Allergy   . Arthritis   . Chicken pox   .  Edema     Family History  Problem Relation Age of Onset  . Arthritis Mother   . Heart disease Mother   . Hypertension Mother   . Diabetes Mother   . Stroke Father   . Hypertension Father   . Stroke Sister   . Hypertension Sister   . Heart disease Maternal Grandfather   . Diabetes Paternal Grandmother   . Heart disease Paternal Grandfather     Past Surgical History:  Procedure Laterality Date  . ABDOMINAL HYSTERECTOMY  1988  . CARPAL TUNNEL RELEASE Right   . REPLACEMENT TOTAL KNEE Right 2010   Social History   Occupational History  . Not on file.   Social History Main Topics  . Smoking  status: Never Smoker  . Smokeless tobacco: Never Used  . Alcohol use Yes     Comment: occasional  . Drug use: No  . Sexual activity: No

## 2016-12-05 ENCOUNTER — Telehealth (INDEPENDENT_AMBULATORY_CARE_PROVIDER_SITE_OTHER): Payer: Self-pay | Admitting: Orthopedic Surgery

## 2016-12-05 ENCOUNTER — Other Ambulatory Visit (INDEPENDENT_AMBULATORY_CARE_PROVIDER_SITE_OTHER): Payer: Self-pay | Admitting: Orthopedic Surgery

## 2016-12-05 DIAGNOSIS — M1712 Unilateral primary osteoarthritis, left knee: Secondary | ICD-10-CM

## 2016-12-05 NOTE — Telephone Encounter (Signed)
LVM with pt to call to schedule surgery. Will try pt again at a later time. 

## 2016-12-06 ENCOUNTER — Other Ambulatory Visit (INDEPENDENT_AMBULATORY_CARE_PROVIDER_SITE_OTHER): Payer: Self-pay | Admitting: Orthopedic Surgery

## 2017-01-31 ENCOUNTER — Encounter: Payer: Medicare HMO | Admitting: Internal Medicine

## 2017-01-31 DIAGNOSIS — H25011 Cortical age-related cataract, right eye: Secondary | ICD-10-CM | POA: Diagnosis not present

## 2017-01-31 DIAGNOSIS — H2513 Age-related nuclear cataract, bilateral: Secondary | ICD-10-CM | POA: Diagnosis not present

## 2017-01-31 DIAGNOSIS — H35363 Drusen (degenerative) of macula, bilateral: Secondary | ICD-10-CM | POA: Diagnosis not present

## 2017-01-31 DIAGNOSIS — H40013 Open angle with borderline findings, low risk, bilateral: Secondary | ICD-10-CM | POA: Diagnosis not present

## 2017-01-31 LAB — HM DIABETES EYE EXAM

## 2017-02-01 ENCOUNTER — Ambulatory Visit (INDEPENDENT_AMBULATORY_CARE_PROVIDER_SITE_OTHER): Payer: Medicare HMO | Admitting: Internal Medicine

## 2017-02-01 ENCOUNTER — Encounter: Payer: Self-pay | Admitting: Internal Medicine

## 2017-02-01 VITALS — BP 126/80 | HR 84 | Temp 98.3°F | Ht 60.25 in | Wt 263.5 lb

## 2017-02-01 DIAGNOSIS — Z0000 Encounter for general adult medical examination without abnormal findings: Secondary | ICD-10-CM

## 2017-02-01 NOTE — Progress Notes (Signed)
HPI:  Pt presents to the clinic today for her Medicare Wellness Exam.  Past Medical History:  Diagnosis Date  . Allergy   . Arthritis   . Chicken pox   . Edema     Current Outpatient Prescriptions  Medication Sig Dispense Refill  . calcium carbonate (OS-CAL) 600 MG TABS tablet Take 600 mg by mouth 3 (three) times daily with meals.    . Cholecalciferol (VITAMIN D3) 5000 UNITS CAPS Take 1 capsule by mouth daily.    . furosemide (LASIX) 20 MG tablet Take 1 tablet (20 mg total) by mouth daily. 90 tablet 3  . vitamin C (ASCORBIC ACID) 500 MG tablet Take 500 mg by mouth daily.     No current facility-administered medications for this visit.     No Known Allergies  Family History  Problem Relation Age of Onset  . Arthritis Mother   . Heart disease Mother   . Hypertension Mother   . Diabetes Mother   . Stroke Father   . Hypertension Father   . Stroke Sister   . Hypertension Sister   . Heart disease Maternal Grandfather   . Diabetes Paternal Grandmother   . Heart disease Paternal Grandfather     Social History   Social History  . Marital status: Married    Spouse name: N/A  . Number of children: N/A  . Years of education: N/A   Occupational History  . Not on file.   Social History Main Topics  . Smoking status: Never Smoker  . Smokeless tobacco: Never Used  . Alcohol use Yes     Comment: occasional  . Drug use: No  . Sexual activity: No   Other Topics Concern  . Not on file   Social History Narrative  . No narrative on file    Hospitiliaztions: None  Health Maintenance:    Flu: never  Tetanus: 08/2013  Pneumovax: 12/2015  Prevnar: 08/2014  Zostavax: never  Mammogram: 05/2016  Pap Smear: 08/2014  Bone Density: 09/2014  Colon Screening: 10/2014  Eye Doctor: annually  Dental Exam: as needed   Providers:   PCP: Webb Silversmith, NP-  Orthopedist: Dr. Marlou Sa    I have personally reviewed and have noted:  1. The patient's medical and social  history 2. Their use of alcohol, tobacco or illicit drugs 3. Their current medications and supplements 4. The patient's functional ability including ADL's, fall risks, home safety risks and hearing or visual impairment. 5. Diet and physical activities 6. Evidence for depression or mood disorder  Subjective:   Review of Systems:   Constitutional: Denies fever, malaise, fatigue, headache or abrupt weight changes.  HEENT: Denies eye pain, eye redness, ear pain, ringing in the ears, wax buildup, runny nose, nasal congestion, bloody nose, or sore throat. Respiratory: Denies difficulty breathing, shortness of breath, cough or sputum production.   Cardiovascular: Pt reports swelling in her legs. Denies chest pain, chest tightness, palpitations or swelling in the hands.  Gastrointestinal: Pt reports intermittent constipation. Denies abdominal pain, bloating, diarrhea or blood in the stool.  GU: Denies urgency, frequency, pain with urination, burning sensation, blood in urine, odor or discharge. Musculoskeletal: Pt reports left knee pain. Denies decrease in range of motion, difficulty with gait, muscle pain or joint swelling.  Skin: Denies redness, rashes, lesions or ulcercations.  Neurological: Denies dizziness, difficulty with memory, difficulty with speech or problems with balance and coordination.  Psych: Denies anxiety, depression, SI/HI.  No other specific complaints in a complete review of  systems (except as listed in HPI above).  Objective:  PE:   BP 126/80   Pulse 84   Temp 98.3 F (36.8 C) (Oral)   Ht 5' 0.25" (1.53 m)   Wt 263 lb 8 oz (119.5 kg)   SpO2 96%   BMI 51.04 kg/m   Wt Readings from Last 3 Encounters:  12/02/16 265 lb (120.2 kg)  12/23/15 272 lb (123.4 kg)  08/21/14 270 lb (122.5 kg)    General: Appears her stated age, obese in NAD. Cardiovascular: Normal rate and rhythm. S1,S2 noted.  Murmur noted. 1+ nonpitting BLE edema. No carotid bruits  noted. Pulmonary/Chest: Normal effort and positive vesicular breath sounds. No respiratory distress. No wheezes, rales or ronchi noted.  Musculoskeletal: Gait slow but steady with use of cane.  Neurological: Alert and oriented.  Psychiatric: Mood and affect normal. Behavior is normal. Judgment and thought content normal.     BMET    Component Value Date/Time   NA 140 12/23/2015 1349   K 3.5 12/23/2015 1349   CL 109 12/23/2015 1349   CO2 27 12/23/2015 1349   GLUCOSE 88 12/23/2015 1349   BUN 16 12/23/2015 1349   CREATININE 0.84 12/23/2015 1349   CALCIUM 9.3 12/23/2015 1349   GFRNONAA >60 08/28/2008 0501   GFRAA  08/28/2008 0501    >60        The eGFR has been calculated using the MDRD equation. This calculation has not been validated in all clinical situations. eGFR's persistently <60 mL/min signify possible Chronic Kidney Disease.    Lipid Panel     Component Value Date/Time   CHOL 202 (H) 12/23/2015 1349   TRIG 76.0 12/23/2015 1349   HDL 73.30 12/23/2015 1349   CHOLHDL 3 12/23/2015 1349   VLDL 15.2 12/23/2015 1349   LDLCALC 114 (H) 12/23/2015 1349    CBC    Component Value Date/Time   WBC 5.4 12/23/2015 1349   RBC 4.22 12/23/2015 1349   HGB 12.9 12/23/2015 1349   HCT 38.9 12/23/2015 1349   PLT 243.0 12/23/2015 1349   MCV 92.2 12/23/2015 1349   MCHC 33.0 12/23/2015 1349   RDW 13.8 12/23/2015 1349   LYMPHSABS 1.7 08/21/2008 1405   MONOABS 0.7 08/21/2008 1405   EOSABS 0.2 08/21/2008 1405   BASOSABS 0.1 08/21/2008 1405    Hgb A1C Lab Results  Component Value Date   HGBA1C 5.5 12/23/2015      Assessment and Plan:   Medicare Annual Wellness Visit:  Diet:  She does eat meat. She consumes fruits and veggies. She tries to avoid fried foods. She drinks mostly water. Physical activity: None currently Depression/mood screen: Negative Hearing: Intact to whispered voice Visual acuity: Grossly normal, performs annual eye exam  ADLs: Capable Fall risk:  None Home safety: Good Cognitive evaluation: Intact to orientation, naming, recall and repetition EOL planning: No adv directives, full code/ I agree  Preventative Medicine: Encouraged her to get a flu shot in the fall. Tetanus, Pneumovax and Prevnar UTD. She declines Zostovax. Mammogram, pap smear, bone density and colon screening UTD. Encouraged her to consume a balanced diet and exercise regimen. Advised her to see an eye doctor and dentist annually. Will check CBC< CMET, Lipid, A1C and Vit D today.   Next appointment: RTC in 1 year, sooner if needed  Webb Silversmith, NP

## 2017-02-01 NOTE — Patient Instructions (Signed)
Health Maintenance for Postmenopausal Women Menopause is a normal process in which your reproductive ability comes to an end. This process happens gradually over a span of months to years, usually between the ages of 22 and 9. Menopause is complete when you have missed 12 consecutive menstrual periods. It is important to talk with your health care provider about some of the most common conditions that affect postmenopausal women, such as heart disease, cancer, and bone loss (osteoporosis). Adopting a healthy lifestyle and getting preventive care can help to promote your health and wellness. Those actions can also lower your chances of developing some of these common conditions. What should I know about menopause? During menopause, you may experience a number of symptoms, such as:  Moderate-to-severe hot flashes.  Night sweats.  Decrease in sex drive.  Mood swings.  Headaches.  Tiredness.  Irritability.  Memory problems.  Insomnia.  Choosing to treat or not to treat menopausal changes is an individual decision that you make with your health care provider. What should I know about hormone replacement therapy and supplements? Hormone therapy products are effective for treating symptoms that are associated with menopause, such as hot flashes and night sweats. Hormone replacement carries certain risks, especially as you become older. If you are thinking about using estrogen or estrogen with progestin treatments, discuss the benefits and risks with your health care provider. What should I know about heart disease and stroke? Heart disease, heart attack, and stroke become more likely as you age. This may be due, in part, to the hormonal changes that your body experiences during menopause. These can affect how your body processes dietary fats, triglycerides, and cholesterol. Heart attack and stroke are both medical emergencies. There are many things that you can do to help prevent heart disease  and stroke:  Have your blood pressure checked at least every 1-2 years. High blood pressure causes heart disease and increases the risk of stroke.  If you are 53-22 years old, ask your health care provider if you should take aspirin to prevent a heart attack or a stroke.  Do not use any tobacco products, including cigarettes, chewing tobacco, or electronic cigarettes. If you need help quitting, ask your health care provider.  It is important to eat a healthy diet and maintain a healthy weight. ? Be sure to include plenty of vegetables, fruits, low-fat dairy products, and lean protein. ? Avoid eating foods that are high in solid fats, added sugars, or salt (sodium).  Get regular exercise. This is one of the most important things that you can do for your health. ? Try to exercise for at least 150 minutes each week. The type of exercise that you do should increase your heart rate and make you sweat. This is known as moderate-intensity exercise. ? Try to do strengthening exercises at least twice each week. Do these in addition to the moderate-intensity exercise.  Know your numbers.Ask your health care provider to check your cholesterol and your blood glucose. Continue to have your blood tested as directed by your health care provider.  What should I know about cancer screening? There are several types of cancer. Take the following steps to reduce your risk and to catch any cancer development as early as possible. Breast Cancer  Practice breast self-awareness. ? This means understanding how your breasts normally appear and feel. ? It also means doing regular breast self-exams. Let your health care provider know about any changes, no matter how small.  If you are 40  or older, have a clinician do a breast exam (clinical breast exam or CBE) every year. Depending on your age, family history, and medical history, it may be recommended that you also have a yearly breast X-ray (mammogram).  If you  have a family history of breast cancer, talk with your health care provider about genetic screening.  If you are at high risk for breast cancer, talk with your health care provider about having an MRI and a mammogram every year.  Breast cancer (BRCA) gene test is recommended for women who have family members with BRCA-related cancers. Results of the assessment will determine the need for genetic counseling and BRCA1 and for BRCA2 testing. BRCA-related cancers include these types: ? Breast. This occurs in males or females. ? Ovarian. ? Tubal. This may also be called fallopian tube cancer. ? Cancer of the abdominal or pelvic lining (peritoneal cancer). ? Prostate. ? Pancreatic.  Cervical, Uterine, and Ovarian Cancer Your health care provider may recommend that you be screened regularly for cancer of the pelvic organs. These include your ovaries, uterus, and vagina. This screening involves a pelvic exam, which includes checking for microscopic changes to the surface of your cervix (Pap test).  For women ages 21-65, health care providers may recommend a pelvic exam and a Pap test every three years. For women ages 79-65, they may recommend the Pap test and pelvic exam, combined with testing for human papilloma virus (HPV), every five years. Some types of HPV increase your risk of cervical cancer. Testing for HPV may also be done on women of any age who have unclear Pap test results.  Other health care providers may not recommend any screening for nonpregnant women who are considered low risk for pelvic cancer and have no symptoms. Ask your health care provider if a screening pelvic exam is right for you.  If you have had past treatment for cervical cancer or a condition that could lead to cancer, you need Pap tests and screening for cancer for at least 20 years after your treatment. If Pap tests have been discontinued for you, your risk factors (such as having a new sexual partner) need to be  reassessed to determine if you should start having screenings again. Some women have medical problems that increase the chance of getting cervical cancer. In these cases, your health care provider may recommend that you have screening and Pap tests more often.  If you have a family history of uterine cancer or ovarian cancer, talk with your health care provider about genetic screening.  If you have vaginal bleeding after reaching menopause, tell your health care provider.  There are currently no reliable tests available to screen for ovarian cancer.  Lung Cancer Lung cancer screening is recommended for adults 69-62 years old who are at high risk for lung cancer because of a history of smoking. A yearly low-dose CT scan of the lungs is recommended if you:  Currently smoke.  Have a history of at least 30 pack-years of smoking and you currently smoke or have quit within the past 15 years. A pack-year is smoking an average of one pack of cigarettes per day for one year.  Yearly screening should:  Continue until it has been 15 years since you quit.  Stop if you develop a health problem that would prevent you from having lung cancer treatment.  Colorectal Cancer  This type of cancer can be detected and can often be prevented.  Routine colorectal cancer screening usually begins at  age 42 and continues through age 45.  If you have risk factors for colon cancer, your health care provider may recommend that you be screened at an earlier age.  If you have a family history of colorectal cancer, talk with your health care provider about genetic screening.  Your health care provider may also recommend using home test kits to check for hidden blood in your stool.  A small camera at the end of a tube can be used to examine your colon directly (sigmoidoscopy or colonoscopy). This is done to check for the earliest forms of colorectal cancer.  Direct examination of the colon should be repeated every  5-10 years until age 71. However, if early forms of precancerous polyps or small growths are found or if you have a family history or genetic risk for colorectal cancer, you may need to be screened more often.  Skin Cancer  Check your skin from head to toe regularly.  Monitor any moles. Be sure to tell your health care provider: ? About any new moles or changes in moles, especially if there is a change in a mole's shape or color. ? If you have a mole that is larger than the size of a pencil eraser.  If any of your family members has a history of skin cancer, especially at a young age, talk with your health care provider about genetic screening.  Always use sunscreen. Apply sunscreen liberally and repeatedly throughout the day.  Whenever you are outside, protect yourself by wearing long sleeves, pants, a wide-brimmed hat, and sunglasses.  What should I know about osteoporosis? Osteoporosis is a condition in which bone destruction happens more quickly than new bone creation. After menopause, you may be at an increased risk for osteoporosis. To help prevent osteoporosis or the bone fractures that can happen because of osteoporosis, the following is recommended:  If you are 46-71 years old, get at least 1,000 mg of calcium and at least 600 mg of vitamin D per day.  If you are older than age 55 but younger than age 65, get at least 1,200 mg of calcium and at least 600 mg of vitamin D per day.  If you are older than age 54, get at least 1,200 mg of calcium and at least 800 mg of vitamin D per day.  Smoking and excessive alcohol intake increase the risk of osteoporosis. Eat foods that are rich in calcium and vitamin D, and do weight-bearing exercises several times each week as directed by your health care provider. What should I know about how menopause affects my mental health? Depression may occur at any age, but it is more common as you become older. Common symptoms of depression  include:  Low or sad mood.  Changes in sleep patterns.  Changes in appetite or eating patterns.  Feeling an overall lack of motivation or enjoyment of activities that you previously enjoyed.  Frequent crying spells.  Talk with your health care provider if you think that you are experiencing depression. What should I know about immunizations? It is important that you get and maintain your immunizations. These include:  Tetanus, diphtheria, and pertussis (Tdap) booster vaccine.  Influenza every year before the flu season begins.  Pneumonia vaccine.  Shingles vaccine.  Your health care provider may also recommend other immunizations. This information is not intended to replace advice given to you by your health care provider. Make sure you discuss any questions you have with your health care provider. Document Released: 08/12/2005  Document Revised: 01/08/2016 Document Reviewed: 03/24/2015 [employer] Interactive Patient Education  2018 [employer]  

## 2017-02-02 LAB — COMPREHENSIVE METABOLIC PANEL
ALK PHOS: 63 U/L (ref 39–117)
ALT: 15 U/L (ref 0–35)
AST: 16 U/L (ref 0–37)
Albumin: 4.3 g/dL (ref 3.5–5.2)
BUN: 14 mg/dL (ref 6–23)
CHLORIDE: 106 meq/L (ref 96–112)
CO2: 26 mEq/L (ref 19–32)
Calcium: 9.4 mg/dL (ref 8.4–10.5)
Creatinine, Ser: 1.02 mg/dL (ref 0.40–1.20)
GFR: 68.91 mL/min (ref >60.00)
GLUCOSE: 101 mg/dL — AB (ref 70–99)
POTASSIUM: 3.6 meq/L (ref 3.5–5.1)
Sodium: 143 mEq/L (ref 135–145)
TOTAL PROTEIN: 7.3 g/dL (ref 6.0–8.3)
Total Bilirubin: 0.5 mg/dL (ref 0.2–1.2)

## 2017-02-02 LAB — CBC
HEMATOCRIT: 41.2 % (ref 36.0–46.0)
HEMOGLOBIN: 13.5 g/dL (ref 12.0–15.0)
MCHC: 32.8 g/dL (ref 30.0–36.0)
MCV: 94.4 fl (ref 78.0–100.0)
Platelets: 260 10*3/uL (ref 150.0–400.0)
RBC: 4.36 Mil/uL (ref 3.87–5.11)
RDW: 13.5 % (ref 11.5–15.5)
WBC: 5 10*3/uL (ref 4.0–10.5)

## 2017-02-02 LAB — LIPID PANEL
CHOL/HDL RATIO: 3
CHOLESTEROL: 195 mg/dL (ref 0–200)
HDL: 76.6 mg/dL (ref >39.00)
LDL CALC: 104 mg/dL — AB (ref 0–99)
NonHDL: 118.22
TRIGLYCERIDES: 69 mg/dL (ref 0.0–149.0)
VLDL: 13.8 mg/dL (ref 0.0–40.0)

## 2017-02-02 LAB — VITAMIN D 25 HYDROXY (VIT D DEFICIENCY, FRACTURES): VITD: 43.14 ng/mL (ref 30.00–100.00)

## 2017-02-02 LAB — HEMOGLOBIN A1C: HEMOGLOBIN A1C: 5.5 % (ref 4.6–6.5)

## 2017-02-27 ENCOUNTER — Other Ambulatory Visit (HOSPITAL_COMMUNITY): Payer: Self-pay

## 2017-02-27 NOTE — Pre-Procedure Instructions (Signed)
Julia Mcfarland  02/27/2017      [id number] [place], [place] [phone] [address] [place] [address] Phone: [phone] Fax: [phone]  [facility] [place], [place] [address] Phone: [phone] Fax: [phone]  [id number] [place], [place] [address] Phone: [phone] Fax: [phone]    Your procedure is scheduled on Tuesday, March 07, 2017  Report to [facility] Admitting Entrance "A" at 5:30 A.M.   Call this number if you have problems the morning of surgery:  [phone]   Remember:  Do not eat food or drink liquids after midnight.  Take these medicines the morning of surgery with A SIP OF WATER: None  7 days before surgery stop taking all Aspirins, Vitamins, Fish oils, and Herbal medications. Also stop all NSAIDS i.e. Advil, Motrin, Aleve, Anaprox, Naproxen, BC and Goody Powders.   Do not wear jewelry, make-up or nail polish.  Do not wear lotions, powders, or perfumes, or deodorant.  Do not shave 48 hours prior to surgery.   Do not bring valuables to the hospital.  [facility] is not responsible for any belongings or valuables.  Contacts, dentures or bridgework may not be worn into surgery.  Leave your suitcase in the car.  After surgery it may be brought to your room.  For patients admitted to the hospital, discharge time will be determined by your treatment team.  Patients discharged the day of surgery will not be allowed to drive home.   Special instructions:  - Preparing For Surgery  Before surgery, you can play an important role. Because skin is not sterile, your skin needs to be as free of germs as possible. You can reduce the number of germs on your skin by washing with CHG (chlorahexidine gluconate) Soap before surgery.  CHG is an  antiseptic cleaner which kills germs and bonds with the skin to continue killing germs even after washing.  Please do not use if you have an allergy to CHG or antibacterial soaps. If your skin becomes reddened/irritated stop using the CHG.  Do not shave (including legs and underarms) for at least 48 hours prior to first CHG shower. It is OK to shave your face.  Please follow these instructions carefully.   1. Shower the NIGHT BEFORE SURGERY and the MORNING OF SURGERY with CHG.   2. If you chose to wash your hair, wash your hair first as usual with your normal shampoo.  3. After you shampoo, rinse your hair and body thoroughly to remove the shampoo.  4. Use CHG as you would any other liquid soap. You can apply CHG directly to the skin and wash gently with a scrungie or a clean washcloth.   5. Apply the CHG Soap to your body ONLY FROM THE NECK DOWN.  Do not use on open wounds or open sores. Avoid contact with your eyes, ears, mouth and genitals (private parts). Wash genitals (private parts) with your normal soap.  6. Wash thoroughly, paying special attention to the area where your surgery will be performed.  7. Thoroughly rinse your body with warm water from the neck down.  8. DO NOT shower/wash with your normal soap after using and rinsing off the CHG Soap.  9. Pat yourself dry with a CLEAN TOWEL.   10. Wear CLEAN PAJAMAS  11. Place CLEAN SHEETS on your bed the night of your first shower and DO NOT SLEEP WITH PETS.  Day of Surgery: Do not apply any deodorants/lotions. Please wear clean clothes to the hospital/surgery center.    Please read over the following fact sheets that you were given. Pain Booklet, Coughing and Deep Breathing, Total Joint Packet, MRSA Information and Surgical Site Infection Prevention

## 2017-02-28 ENCOUNTER — Encounter (HOSPITAL_COMMUNITY)
Admission: RE | Admit: 2017-02-28 | Discharge: 2017-02-28 | Disposition: A | Discharge status: Home / Self Care | Payer: Medicare HMO | Source: Ambulatory Visit | Attending: Orthopedic Surgery | Admitting: Orthopedic Surgery

## 2017-02-28 ENCOUNTER — Ambulatory Visit (HOSPITAL_COMMUNITY)
Admission: RE | Admit: 2017-02-28 | Discharge: 2017-02-28 | Disposition: A | Discharge status: Home / Self Care | Payer: Medicare HMO | Source: Ambulatory Visit | Attending: Orthopedic Surgery | Admitting: Orthopedic Surgery

## 2017-02-28 ENCOUNTER — Encounter (HOSPITAL_COMMUNITY): Payer: Self-pay

## 2017-02-28 DIAGNOSIS — Z01812 Encounter for preprocedural laboratory examination: Secondary | ICD-10-CM | POA: Insufficient documentation

## 2017-02-28 DIAGNOSIS — Z96652 Presence of left artificial knee joint: Secondary | ICD-10-CM | POA: Diagnosis not present

## 2017-02-28 DIAGNOSIS — Z01810 Encounter for preprocedural cardiovascular examination: Secondary | ICD-10-CM | POA: Diagnosis not present

## 2017-02-28 DIAGNOSIS — I498 Other specified cardiac arrhythmias: Secondary | ICD-10-CM | POA: Diagnosis not present

## 2017-02-28 DIAGNOSIS — Z01818 Encounter for other preprocedural examination: Secondary | ICD-10-CM | POA: Diagnosis not present

## 2017-02-28 DIAGNOSIS — Z419 Encounter for procedure for purposes other than remedying health state, unspecified: Secondary | ICD-10-CM

## 2017-02-28 DIAGNOSIS — M1712 Unilateral primary osteoarthritis, left knee: Secondary | ICD-10-CM | POA: Diagnosis not present

## 2017-02-28 HISTORY — DX: Other specified postprocedural states: R11.2

## 2017-02-28 HISTORY — DX: Cardiac murmur, unspecified: R01.1

## 2017-02-28 HISTORY — DX: Other specified postprocedural states: Z98.890

## 2017-02-28 LAB — BASIC METABOLIC PANEL
ANION GAP: 8 (ref 5–15)
BUN: 12 mg/dL (ref 6–20)
CHLORIDE: 107 mmol/L (ref 101–111)
CO2: 24 mmol/L (ref 22–32)
Calcium: 9.1 mg/dL (ref 8.9–10.3)
Creatinine, Ser: 0.85 mg/dL (ref 0.44–1.00)
GFR calc Af Amer: >60 mL/min (ref >60)
GFR calc non Af Amer: >60 mL/min (ref >60)
GLUCOSE: 107 mg/dL — AB (ref 65–99)
POTASSIUM: 3.9 mmol/L (ref 3.5–5.1)
Sodium: 139 mmol/L (ref 135–145)

## 2017-02-28 LAB — SURGICAL PCR SCREEN
MRSA, PCR: NEGATIVE
STAPHYLOCOCCUS AUREUS: NEGATIVE

## 2017-02-28 LAB — URINALYSIS, ROUTINE W REFLEX MICROSCOPIC
BILIRUBIN URINE: NEGATIVE
GLUCOSE, UA: NEGATIVE mg/dL
Hgb urine dipstick: NEGATIVE
KETONES UR: NEGATIVE mg/dL
Leukocytes, UA: NEGATIVE
NITRITE: NEGATIVE
PH: 6 (ref 5.0–8.0)
Protein, ur: NEGATIVE mg/dL
SPECIFIC GRAVITY, URINE: 1.01 (ref 1.005–1.030)

## 2017-02-28 LAB — CBC
HEMATOCRIT: 40.1 % (ref 36.0–46.0)
HEMOGLOBIN: 13 g/dL (ref 12.0–15.0)
MCH: 30.4 pg (ref 26.0–34.0)
MCHC: 32.4 g/dL (ref 30.0–36.0)
MCV: 93.9 fL (ref 78.0–100.0)
Platelets: 239 10*3/uL (ref 150–400)
RBC: 4.27 MIL/uL (ref 3.87–5.11)
RDW: 13.2 % (ref 11.5–15.5)
WBC: 5.1 10*3/uL (ref 4.0–10.5)

## 2017-03-01 LAB — URINE CULTURE

## 2017-03-07 ENCOUNTER — Encounter (HOSPITAL_COMMUNITY)
Admission: RE | Disposition: A | Discharge status: Home Health | Payer: Self-pay | Source: Ambulatory Visit | Attending: Orthopedic Surgery

## 2017-03-07 ENCOUNTER — Inpatient Hospital Stay (HOSPITAL_COMMUNITY)
Admission: RE | Admit: 2017-03-07 | Discharge: 2017-03-10 | DRG: 470 | Disposition: A | Discharge status: Home Health | Payer: Medicare HMO | Source: Ambulatory Visit | Attending: Orthopedic Surgery | Admitting: Orthopedic Surgery

## 2017-03-07 ENCOUNTER — Inpatient Hospital Stay (HOSPITAL_COMMUNITY): Payer: Medicare HMO | Admitting: Anesthesiology

## 2017-03-07 DIAGNOSIS — M810 Age-related osteoporosis without current pathological fracture: Secondary | ICD-10-CM | POA: Diagnosis not present

## 2017-03-07 DIAGNOSIS — M1710 Unilateral primary osteoarthritis, unspecified knee: Secondary | ICD-10-CM | POA: Diagnosis present

## 2017-03-07 DIAGNOSIS — Z79899 Other long term (current) drug therapy: Secondary | ICD-10-CM | POA: Diagnosis not present

## 2017-03-07 DIAGNOSIS — M25562 Pain in left knee: Secondary | ICD-10-CM | POA: Diagnosis present

## 2017-03-07 DIAGNOSIS — M1712 Unilateral primary osteoarthritis, left knee: Principal | ICD-10-CM | POA: Diagnosis present

## 2017-03-07 DIAGNOSIS — R7303 Prediabetes: Secondary | ICD-10-CM | POA: Diagnosis not present

## 2017-03-07 DIAGNOSIS — Z96651 Presence of right artificial knee joint: Secondary | ICD-10-CM | POA: Diagnosis not present

## 2017-03-07 DIAGNOSIS — Z90710 Acquired absence of both cervix and uterus: Secondary | ICD-10-CM

## 2017-03-07 DIAGNOSIS — Z96652 Presence of left artificial knee joint: Secondary | ICD-10-CM | POA: Diagnosis not present

## 2017-03-07 DIAGNOSIS — Z6841 Body Mass Index (BMI) 40.0 and over, adult: Secondary | ICD-10-CM

## 2017-03-07 DIAGNOSIS — G8918 Other acute postprocedural pain: Secondary | ICD-10-CM | POA: Diagnosis not present

## 2017-03-07 HISTORY — PX: TOTAL KNEE ARTHROPLASTY: SHX125

## 2017-03-07 SURGERY — ARTHROPLASTY, KNEE, TOTAL
Anesthesia: Regional | Site: Knee | Laterality: Left

## 2017-03-07 MED ORDER — BUPIVACAINE HCL (PF) 0.5 % IJ SOLN
INTRAMUSCULAR | Status: DC | PRN
  Administered 2017-03-07: 20 mL

## 2017-03-07 MED ORDER — ESMOLOL HCL 100 MG/10ML IV SOLN
INTRAVENOUS | Status: AC
  Filled 2017-03-07: qty 10

## 2017-03-07 MED ORDER — SODIUM CHLORIDE 0.9 % IR SOLN
Status: DC | PRN
  Administered 2017-03-07: 3000 mL

## 2017-03-07 MED ORDER — MORPHINE SULFATE (PF) 4 MG/ML IV SOLN
2.0000 mg | INTRAVENOUS | Status: DC | PRN
Start: 2017-03-07 — End: 2017-03-10
  Administered 2017-03-07: 2 mg via INTRAVENOUS
  Filled 2017-03-07: qty 1

## 2017-03-07 MED ORDER — MENTHOL 3 MG MT LOZG: 1.0000 | LOZENGE | OROMUCOSAL | Status: DC | PRN

## 2017-03-07 MED ORDER — CHLORHEXIDINE GLUCONATE 4 % EX LIQD: 60.0000 mL | Freq: Once | CUTANEOUS | Status: DC

## 2017-03-07 MED ORDER — CEFAZOLIN SODIUM-DEXTROSE 2-4 GM/100ML-% IV SOLN
2.0000 g | Freq: Four times a day (QID) | INTRAVENOUS | Status: AC
  Administered 2017-03-07 (×2): 2 g via INTRAVENOUS
  Filled 2017-03-07 (×2): qty 100

## 2017-03-07 MED ORDER — CALCIUM CARBONATE 1250 (500 CA) MG PO TABS
600.0000 mg | ORAL_TABLET | Freq: Three times a day (TID) | ORAL | Status: DC
  Filled 2017-03-07: qty 1

## 2017-03-07 MED ORDER — LACTATED RINGERS IV SOLN
INTRAVENOUS | Status: DC | PRN
  Administered 2017-03-07 (×2): via INTRAVENOUS

## 2017-03-07 MED ORDER — KETAMINE HCL-SODIUM CHLORIDE 100-0.9 MG/10ML-% IV SOSY
PREFILLED_SYRINGE | INTRAVENOUS | Status: AC
  Filled 2017-03-07: qty 10

## 2017-03-07 MED ORDER — ROCURONIUM BROMIDE 10 MG/ML (PF) SYRINGE
PREFILLED_SYRINGE | INTRAVENOUS | Status: AC
  Filled 2017-03-07: qty 5

## 2017-03-07 MED ORDER — DEXAMETHASONE SODIUM PHOSPHATE 10 MG/ML IJ SOLN
INTRAMUSCULAR | Status: AC
  Filled 2017-03-07: qty 2

## 2017-03-07 MED ORDER — KETAMINE HCL 10 MG/ML IJ SOLN
INTRAMUSCULAR | Status: DC | PRN
  Administered 2017-03-07: 5 mg via INTRAVENOUS
  Administered 2017-03-07: 10 mg via INTRAVENOUS

## 2017-03-07 MED ORDER — CLONIDINE HCL (ANALGESIA) 100 MCG/ML EP SOLN
EPIDURAL | Status: DC | PRN
  Administered 2017-03-07: 1 mL

## 2017-03-07 MED ORDER — BUPIVACAINE LIPOSOME 1.3 % IJ SUSP
INTRAMUSCULAR | Status: DC | PRN
  Administered 2017-03-07: 20 mL

## 2017-03-07 MED ORDER — PHENYLEPHRINE 40 MCG/ML (10ML) SYRINGE FOR IV PUSH (FOR BLOOD PRESSURE SUPPORT)
PREFILLED_SYRINGE | INTRAVENOUS | Status: AC
  Filled 2017-03-07: qty 10

## 2017-03-07 MED ORDER — METHOCARBAMOL 500 MG PO TABS
500.0000 mg | ORAL_TABLET | Freq: Four times a day (QID) | ORAL | Status: DC | PRN
  Administered 2017-03-07 – 2017-03-10 (×3): 500 mg via ORAL
  Filled 2017-03-07 (×2): qty 1

## 2017-03-07 MED ORDER — GABAPENTIN 300 MG PO CAPS
300.0000 mg | ORAL_CAPSULE | Freq: Three times a day (TID) | ORAL | Status: DC
  Administered 2017-03-07 – 2017-03-10 (×9): 300 mg via ORAL
  Filled 2017-03-07 (×9): qty 1

## 2017-03-07 MED ORDER — MIDAZOLAM HCL 5 MG/5ML IJ SOLN
INTRAMUSCULAR | Status: DC | PRN
  Administered 2017-03-07: .5 mg via INTRAVENOUS
  Administered 2017-03-07: 1 mg via INTRAVENOUS
  Administered 2017-03-07 (×2): .5 mg via INTRAVENOUS
  Administered 2017-03-07: 1 mg via INTRAVENOUS
  Administered 2017-03-07: .5 mg via INTRAVENOUS

## 2017-03-07 MED ORDER — ESMOLOL HCL 100 MG/10ML IV SOLN
INTRAVENOUS | Status: DC | PRN
  Administered 2017-03-07: 10 mg via INTRAVENOUS

## 2017-03-07 MED ORDER — OXYCODONE HCL 5 MG/5ML PO SOLN: 5.0000 mg | Freq: Once | ORAL | Status: AC | PRN

## 2017-03-07 MED ORDER — TETRACAINE HCL 1 % IJ SOLN
INTRAMUSCULAR | Status: AC
  Filled 2017-03-07: qty 2

## 2017-03-07 MED ORDER — FENTANYL CITRATE (PF) 100 MCG/2ML IJ SOLN
INTRAMUSCULAR | Status: AC
  Administered 2017-03-07: 50 ug via INTRAVENOUS
  Filled 2017-03-07: qty 2

## 2017-03-07 MED ORDER — SUCCINYLCHOLINE CHLORIDE 200 MG/10ML IV SOSY
PREFILLED_SYRINGE | INTRAVENOUS | Status: AC
  Filled 2017-03-07: qty 10

## 2017-03-07 MED ORDER — MIDAZOLAM HCL 2 MG/2ML IJ SOLN
INTRAMUSCULAR | Status: AC
  Filled 2017-03-07: qty 2

## 2017-03-07 MED ORDER — ONDANSETRON HCL 4 MG/2ML IJ SOLN: 4.0000 mg | Freq: Once | INTRAMUSCULAR | Status: DC | PRN

## 2017-03-07 MED ORDER — 0.9 % SODIUM CHLORIDE (POUR BTL) OPTIME
TOPICAL | Status: DC | PRN
  Administered 2017-03-07 (×3): 1000 mL

## 2017-03-07 MED ORDER — ONDANSETRON HCL 4 MG/2ML IJ SOLN: 4.0000 mg | Freq: Four times a day (QID) | INTRAMUSCULAR | Status: DC | PRN

## 2017-03-07 MED ORDER — ONDANSETRON HCL 4 MG/2ML IJ SOLN
INTRAMUSCULAR | Status: AC
  Filled 2017-03-07: qty 2

## 2017-03-07 MED ORDER — BUPIVACAINE LIPOSOME 1.3 % IJ SUSP
20.0000 mL | INTRAMUSCULAR | Status: DC
  Filled 2017-03-07: qty 20

## 2017-03-07 MED ORDER — ACETAMINOPHEN 650 MG RE SUPP: 650.0000 mg | Freq: Four times a day (QID) | RECTAL | Status: DC | PRN

## 2017-03-07 MED ORDER — TRANEXAMIC ACID 1000 MG/10ML IV SOLN
INTRAVENOUS | Status: AC | PRN
  Administered 2017-03-07: 2000 mg via TOPICAL

## 2017-03-07 MED ORDER — MORPHINE SULFATE (PF) 4 MG/ML IV SOLN
INTRAVENOUS | Status: DC | PRN
  Administered 2017-03-07: 8 mg

## 2017-03-07 MED ORDER — FENTANYL CITRATE (PF) 100 MCG/2ML IJ SOLN
25.0000 ug | INTRAMUSCULAR | Status: DC | PRN
  Administered 2017-03-07: 50 ug via INTRAVENOUS

## 2017-03-07 MED ORDER — FENTANYL CITRATE (PF) 250 MCG/5ML IJ SOLN
INTRAMUSCULAR | Status: DC | PRN
  Administered 2017-03-07 (×6): 25 ug via INTRAVENOUS

## 2017-03-07 MED ORDER — RIVAROXABAN 10 MG PO TABS
10.0000 mg | ORAL_TABLET | Freq: Every day | ORAL | Status: DC
  Administered 2017-03-07 – 2017-03-10 (×4): 10 mg via ORAL
  Filled 2017-03-07 (×4): qty 1

## 2017-03-07 MED ORDER — BUPIVACAINE HCL (PF) 0.5 % IJ SOLN
INTRAMUSCULAR | Status: AC
  Filled 2017-03-07: qty 30

## 2017-03-07 MED ORDER — METOCLOPRAMIDE HCL 5 MG PO TABS: 5.0000 mg | ORAL_TABLET | Freq: Three times a day (TID) | ORAL | Status: DC | PRN

## 2017-03-07 MED ORDER — CEFAZOLIN SODIUM-DEXTROSE 2-4 GM/100ML-% IV SOLN
INTRAVENOUS | Status: AC
  Filled 2017-03-07: qty 100

## 2017-03-07 MED ORDER — VITAMIN C 500 MG PO TABS
500.0000 mg | ORAL_TABLET | Freq: Every day | ORAL | Status: DC
  Administered 2017-03-08 – 2017-03-10 (×3): 500 mg via ORAL
  Filled 2017-03-07 (×2): qty 1

## 2017-03-07 MED ORDER — OXYCODONE HCL 5 MG PO TABS
5.0000 mg | ORAL_TABLET | ORAL | Status: DC | PRN
  Administered 2017-03-07 – 2017-03-10 (×6): 10 mg via ORAL
  Filled 2017-03-07 (×6): qty 2

## 2017-03-07 MED ORDER — TRANEXAMIC ACID 1000 MG/10ML IV SOLN
2000.0000 mg | Freq: Once | INTRAVENOUS | Status: DC
  Filled 2017-03-07: qty 20

## 2017-03-07 MED ORDER — METOCLOPRAMIDE HCL 5 MG/ML IJ SOLN
5.0000 mg | Freq: Three times a day (TID) | INTRAMUSCULAR | Status: DC | PRN
Start: 2017-03-07 — End: 2017-03-10
  Administered 2017-03-07: 5 mg via INTRAVENOUS
  Filled 2017-03-07: qty 2

## 2017-03-07 MED ORDER — METHOCARBAMOL 1000 MG/10ML IJ SOLN
500.0000 mg | Freq: Four times a day (QID) | INTRAVENOUS | Status: DC | PRN
  Filled 2017-03-07: qty 5

## 2017-03-07 MED ORDER — PROPOFOL 500 MG/50ML IV EMUL
INTRAVENOUS | Status: DC | PRN
  Administered 2017-03-07: 25 ug/kg/min via INTRAVENOUS

## 2017-03-07 MED ORDER — MORPHINE SULFATE (PF) 4 MG/ML IV SOLN
INTRAVENOUS | Status: AC
  Filled 2017-03-07: qty 2

## 2017-03-07 MED ORDER — PROPOFOL 10 MG/ML IV BOLUS
INTRAVENOUS | Status: AC
  Filled 2017-03-07: qty 20

## 2017-03-07 MED ORDER — FUROSEMIDE 20 MG PO TABS
20.0000 mg | ORAL_TABLET | Freq: Every day | ORAL | Status: DC
  Administered 2017-03-07 – 2017-03-10 (×4): 20 mg via ORAL
  Filled 2017-03-07 (×4): qty 1

## 2017-03-07 MED ORDER — CLONIDINE HCL (ANALGESIA) 100 MCG/ML EP SOLN
150.0000 ug | Freq: Once | EPIDURAL | Status: DC
  Filled 2017-03-07: qty 1.5

## 2017-03-07 MED ORDER — PHENOL 1.4 % MT LIQD: 1.0000 | OROMUCOSAL | Status: DC | PRN

## 2017-03-07 MED ORDER — OXYCODONE HCL 5 MG PO TABS
ORAL_TABLET | ORAL | Status: AC
  Administered 2017-03-07: 12:00:00
  Filled 2017-03-07: qty 1

## 2017-03-07 MED ORDER — ONDANSETRON HCL 4 MG PO TABS: 4.0000 mg | ORAL_TABLET | Freq: Four times a day (QID) | ORAL | Status: DC | PRN

## 2017-03-07 MED ORDER — CALCIUM CARBONATE 1250 (500 CA) MG PO TABS
500.0000 mg | ORAL_TABLET | Freq: Three times a day (TID) | ORAL | Status: DC
  Administered 2017-03-08 – 2017-03-10 (×7): 500 mg via ORAL
  Filled 2017-03-07 (×7): qty 1

## 2017-03-07 MED ORDER — TRANEXAMIC ACID 1000 MG/10ML IV SOLN
1000.0000 mg | INTRAVENOUS | Status: AC
  Administered 2017-03-07: 1000 mg via INTRAVENOUS
  Filled 2017-03-07: qty 1100

## 2017-03-07 MED ORDER — DEXAMETHASONE SODIUM PHOSPHATE 10 MG/ML IJ SOLN
INTRAMUSCULAR | Status: DC | PRN
  Administered 2017-03-07: 10 mg via INTRAVENOUS

## 2017-03-07 MED ORDER — VITAMIN D 1000 UNITS PO TABS
5000.0000 [IU] | ORAL_TABLET | Freq: Every day | ORAL | Status: DC
  Administered 2017-03-07 – 2017-03-10 (×4): 5000 [IU] via ORAL
  Filled 2017-03-07 (×7): qty 5

## 2017-03-07 MED ORDER — ONDANSETRON HCL 4 MG/2ML IJ SOLN
INTRAMUSCULAR | Status: DC | PRN
Start: 2017-03-07 — End: 2017-03-07
  Administered 2017-03-07: 4 mg via INTRAVENOUS

## 2017-03-07 MED ORDER — OXYCODONE HCL 5 MG PO TABS
5.0000 mg | ORAL_TABLET | Freq: Once | ORAL | Status: AC | PRN
  Administered 2017-03-07: 5 mg via ORAL

## 2017-03-07 MED ORDER — DEXTROSE 5 % IV SOLN
3.0000 g | INTRAVENOUS | Status: AC
  Administered 2017-03-07: 3 g via INTRAVENOUS
  Filled 2017-03-07: qty 3000

## 2017-03-07 MED ORDER — SALINE FLUSH 0.9 % IV SOLN
INTRAVENOUS | Status: DC | PRN
  Administered 2017-03-07: 20 mL via INTRA_ARTICULAR

## 2017-03-07 MED ORDER — METHOCARBAMOL 500 MG PO TABS
ORAL_TABLET | ORAL | Status: AC
  Administered 2017-03-07: 12:00:00
  Filled 2017-03-07: qty 1

## 2017-03-07 MED ORDER — ACETAMINOPHEN 325 MG PO TABS
650.0000 mg | ORAL_TABLET | Freq: Four times a day (QID) | ORAL | Status: DC | PRN
  Administered 2017-03-09: 650 mg via ORAL
  Filled 2017-03-07: qty 2

## 2017-03-07 MED ORDER — CHLORHEXIDINE GLUCONATE 4 % EX LIQD
60.0000 mL | Freq: Once | CUTANEOUS | Status: DC
Start: 2017-03-07 — End: 2017-03-07

## 2017-03-07 MED ORDER — FENTANYL CITRATE (PF) 250 MCG/5ML IJ SOLN
INTRAMUSCULAR | Status: AC
  Filled 2017-03-07: qty 5

## 2017-03-07 MED ORDER — DOCUSATE SODIUM 100 MG PO CAPS
100.0000 mg | ORAL_CAPSULE | Freq: Two times a day (BID) | ORAL | Status: DC
  Administered 2017-03-07 – 2017-03-10 (×7): 100 mg via ORAL
  Filled 2017-03-07 (×7): qty 1

## 2017-03-07 MED ORDER — PHENYLEPHRINE 40 MCG/ML (10ML) SYRINGE FOR IV PUSH (FOR BLOOD PRESSURE SUPPORT)
PREFILLED_SYRINGE | INTRAVENOUS | Status: DC | PRN
  Administered 2017-03-07: 40 ug via INTRAVENOUS
  Administered 2017-03-07: 80 ug via INTRAVENOUS
  Administered 2017-03-07: 40 ug via INTRAVENOUS

## 2017-03-07 SURGICAL SUPPLY — 84 items
BANDAGE ACE 4X5 VEL STRL LF (GAUZE/BANDAGES/DRESSINGS) ×3 IMPLANT
BANDAGE ACE 6X5 VEL STRL LF (GAUZE/BANDAGES/DRESSINGS) ×2 IMPLANT
BANDAGE ESMARK 6X9 LF (GAUZE/BANDAGES/DRESSINGS) ×1 IMPLANT
BLADE SAG 18X100X1.27 (BLADE) ×3 IMPLANT
BLADE SAW SGTL 13.0X1.19X90.0M (BLADE) IMPLANT
BNDG CMPR 9X6 STRL LF SNTH (GAUZE/BANDAGES/DRESSINGS) ×1
BNDG CMPR MED 15X6 ELC VLCR LF (GAUZE/BANDAGES/DRESSINGS) ×3
BNDG COHESIVE 6X5 TAN STRL LF (GAUZE/BANDAGES/DRESSINGS) ×3 IMPLANT
BNDG ELASTIC 6X15 VLCR STRL LF (GAUZE/BANDAGES/DRESSINGS) ×9 IMPLANT
BNDG ESMARK 6X9 LF (GAUZE/BANDAGES/DRESSINGS) ×3
BOWL SMART MIX CTS (DISPOSABLE) ×4 IMPLANT
CAPT KNEE TOTAL 3 ×2 IMPLANT
CEMENT BONE SIMPLEX SPEEDSET (Cement) ×6 IMPLANT
CLOSURE STERI-STRIP 1/2X4 (GAUZE/BANDAGES/DRESSINGS) ×1
CLOSURE WOUND 1/2 X4 (GAUZE/BANDAGES/DRESSINGS) ×2
CLSR STERI-STRIP ANTIMIC 1/2X4 (GAUZE/BANDAGES/DRESSINGS) ×1 IMPLANT
CONT SPECI 4OZ STER CLIK (MISCELLANEOUS) ×2 IMPLANT
COVER SURGICAL LIGHT HANDLE (MISCELLANEOUS) ×3 IMPLANT
CUFF TOURNIQUET SINGLE 44IN (TOURNIQUET CUFF) ×2 IMPLANT
DECANTER SPIKE VIAL GLASS SM (MISCELLANEOUS) ×3 IMPLANT
DRAPE HALF SHEET 40X57 (DRAPES) ×2 IMPLANT
DRAPE INCISE IOBAN 66X45 STRL (DRAPES) ×2 IMPLANT
DRAPE ORTHO SPLIT 77X108 STRL (DRAPES) ×9
DRAPE SURG ORHT 6 SPLT 77X108 (DRAPES) ×3 IMPLANT
DRAPE U-SHAPE 47X51 STRL (DRAPES) ×3 IMPLANT
DRSG AQUACEL AG ADV 3.5X10 (GAUZE/BANDAGES/DRESSINGS) ×3 IMPLANT
DRSG AQUACEL AG ADV 3.5X14 (GAUZE/BANDAGES/DRESSINGS) IMPLANT
DRSG PAD ABDOMINAL 8X10 ST (GAUZE/BANDAGES/DRESSINGS) ×4 IMPLANT
DURAPREP 26ML APPLICATOR (WOUND CARE) ×6 IMPLANT
ELECT CAUTERY BLADE 6.4 (BLADE) ×3 IMPLANT
ELECT REM PT RETURN 9FT ADLT (ELECTROSURGICAL) ×3
ELECTRODE REM PT RTRN 9FT ADLT (ELECTROSURGICAL) ×1 IMPLANT
GAUZE SPONGE 4X4 12PLY STRL (GAUZE/BANDAGES/DRESSINGS) ×3 IMPLANT
GLOVE BIOGEL PI IND STRL 6.5 (GLOVE) IMPLANT
GLOVE BIOGEL PI IND STRL 7.5 (GLOVE) ×1 IMPLANT
GLOVE BIOGEL PI IND STRL 8 (GLOVE) ×1 IMPLANT
GLOVE BIOGEL PI INDICATOR 6.5 (GLOVE) ×6
GLOVE BIOGEL PI INDICATOR 7.5 (GLOVE) ×6
GLOVE BIOGEL PI INDICATOR 8 (GLOVE) ×4
GLOVE ECLIPSE 7.0 STRL STRAW (GLOVE) ×3 IMPLANT
GLOVE SURG ORTHO 8.0 STRL STRW (GLOVE) ×3 IMPLANT
GLOVE SURG SS PI 6.5 STRL IVOR (GLOVE) ×2 IMPLANT
GOWN STRL REUS W/ TWL LRG LVL3 (GOWN DISPOSABLE) ×3 IMPLANT
GOWN STRL REUS W/TWL LRG LVL3 (GOWN DISPOSABLE) ×9
HANDPIECE INTERPULSE COAX TIP (DISPOSABLE) ×3
HOOD PEEL AWAY FLYTE STAYCOOL (MISCELLANEOUS) ×11 IMPLANT
IMMOBILIZER KNEE 20 (SOFTGOODS) IMPLANT
IMMOBILIZER KNEE 22 (SOFTGOODS) ×2 IMPLANT
IMMOBILIZER KNEE 22 UNIV (SOFTGOODS) ×2 IMPLANT
IMMOBILIZER KNEE 24 THIGH 36 (MISCELLANEOUS) IMPLANT
IMMOBILIZER KNEE 24 UNIV (MISCELLANEOUS)
KIT BASIN OR (CUSTOM PROCEDURE TRAY) ×3 IMPLANT
KIT ROOM TURNOVER OR (KITS) ×3 IMPLANT
MANIFOLD NEPTUNE II (INSTRUMENTS) ×3 IMPLANT
NDL SAFETY ECLIPSE 18X1.5 (NEEDLE) ×1 IMPLANT
NDL SPNL 18GX3.5 QUINCKE PK (NEEDLE) ×1 IMPLANT
NEEDLE HYPO 18GX1.5 SHARP (NEEDLE) ×3
NEEDLE HYPO 22GX1.5 SAFETY (NEEDLE) ×3 IMPLANT
NEEDLE SPNL 18GX3.5 QUINCKE PK (NEEDLE) ×3 IMPLANT
NS IRRIG 1000ML POUR BTL (IV SOLUTION) ×6 IMPLANT
PACK TOTAL JOINT (CUSTOM PROCEDURE TRAY) ×3 IMPLANT
PAD ARMBOARD 7.5X6 YLW CONV (MISCELLANEOUS) ×6 IMPLANT
PAD CAST 4YDX4 CTTN HI CHSV (CAST SUPPLIES) ×1 IMPLANT
PADDING CAST COTTON 4X4 STRL (CAST SUPPLIES) ×3
PADDING CAST COTTON 6X4 STRL (CAST SUPPLIES) ×3 IMPLANT
SET HNDPC FAN SPRY TIP SCT (DISPOSABLE) IMPLANT
SPONGE LAP 18X18 X RAY DECT (DISPOSABLE) ×2 IMPLANT
STEM CEMENTED (Stem) ×2 IMPLANT
STRIP CLOSURE SKIN 1/2X4 (GAUZE/BANDAGES/DRESSINGS) ×4 IMPLANT
SUCTION FRAZIER HANDLE 10FR (MISCELLANEOUS) ×2
SUCTION TUBE FRAZIER 10FR DISP (MISCELLANEOUS) ×1 IMPLANT
SUT MNCRL AB 3-0 PS2 18 (SUTURE) ×3 IMPLANT
SUT VIC AB 0 CT1 27 (SUTURE) ×12
SUT VIC AB 0 CT1 27XBRD ANBCTR (SUTURE) ×3 IMPLANT
SUT VIC AB 1 CT1 27 (SUTURE) ×15
SUT VIC AB 1 CT1 27XBRD ANBCTR (SUTURE) ×5 IMPLANT
SUT VIC AB 2-0 CT1 27 (SUTURE) ×18
SUT VIC AB 2-0 CT1 TAPERPNT 27 (SUTURE) ×4 IMPLANT
SYR 30ML LL (SYRINGE) ×9 IMPLANT
SYR TB 1ML LUER SLIP (SYRINGE) ×3 IMPLANT
TOWEL OR 17X24 6PK STRL BLUE (TOWEL DISPOSABLE) ×6 IMPLANT
TOWEL OR 17X26 10 PK STRL BLUE (TOWEL DISPOSABLE) ×6 IMPLANT
TRAY CATH 16FR W/PLASTIC CATH (SET/KITS/TRAYS/PACK) ×2 IMPLANT
WATER STERILE IRR 1000ML POUR (IV SOLUTION) IMPLANT

## 2017-03-07 NOTE — Anesthesia Procedure Notes (Addendum)
Anesthesia Regional Block: Adductor canal block   Pre-Anesthetic Checklist: ,, timeout performed, Correct Patient, Correct Site, Correct Laterality, Correct Procedure, Correct Position, site marked, Risks and benefits discussed,  Surgical consent,  Pre-op evaluation,  At surgeon's request and post-op pain management  Laterality: Left  Prep: chloraprep       Needles:  Injection technique: Single-shot  Needle Type: Echogenic Stimulator Needle     Needle Length: 9cm  Needle Gauge: 21     Additional Needles:   Procedures: ultrasound guided,,,,,,,,  Narrative:  Start time: 03/07/2017 7:05 AM End time: 03/07/2017 7:10 AM Injection made incrementally with aspirations every 5 mL. Anesthesiologist: Kory Rains  Additional Notes: 30 cc 0.5% Bupivacaine 1:200 epi  injected easily

## 2017-03-07 NOTE — H&P (Signed)
TOTAL KNEE ADMISSION H&P  Patient is being admitted for left total knee arthroplasty.  Subjective:  Chief Complaint:left knee pain.  HPI: Julia Mcfarland, 71 y.o. female, has a history of pain and functional disability in the left knee due to arthritis and has failed non-surgical conservative treatments for greater than 12 weeks to includeNSAID's and/or analgesics, corticosteriod injections, use of assistive devices and activity modification.  Onset of symptoms was gradual, starting 8 years ago with gradually worsening course since that time. The patient noted no past surgery on the left knee(s).  Patient currently rates pain in the left knee(s) at 8 out of 10 with activity. Patient has night pain, worsening of pain with activity and weight bearing, pain that interferes with activities of daily living, pain with passive range of motion, crepitus and joint swelling.  Patient has evidence of subchondral cysts, subchondral sclerosis, periarticular osteophytes and joint space narrowing by imaging studies. This patient has had A good result with right total knee replacement.. There is no active infection.  Patient Active Problem List   Diagnosis Date Noted  . Environmental allergies 08/21/2014  . Arthritis 08/21/2014  . Prediabetes 08/21/2014  . Osteoporosis 08/14/2013  . Morbid obesity with BMI of 50.0-59.9, adult (Julia Mcfarland) 08/14/2013   Past Medical History:  Diagnosis Date  . Allergy   . Arthritis   . Chicken pox   . Edema   . Heart murmur    YEARS  AGO  NO PROBLEM   . PONV (postoperative nausea and vomiting)     Past Surgical History:  Procedure Laterality Date  . ABDOMINAL HYSTERECTOMY  1988  . CARPAL TUNNEL RELEASE Right   . REPLACEMENT TOTAL KNEE Right 2010    Prescriptions Prior to Admission  Medication Sig Dispense Refill Last Dose  . calcium carbonate (OS-CAL) 600 MG TABS tablet Take 600 mg by mouth 3 (three) times daily with meals.   week  . Cholecalciferol (VITAMIN D3) 5000  UNITS CAPS Take 5,000 Units by mouth daily.    week  . furosemide (LASIX) 20 MG tablet Take 1 tablet (20 mg total) by mouth daily. 90 tablet 3 Past Week at Unknown time  . vitamin C (ASCORBIC ACID) 500 MG tablet Take 500 mg by mouth daily.   week   No Known Allergies  Social History  Substance Use Topics  . Smoking status: Never Smoker  . Smokeless tobacco: Never Used  . Alcohol use Yes     Comment: occasional    Family History  Problem Relation Age of Onset  . Arthritis Mother   . Heart disease Mother   . Hypertension Mother   . Diabetes Mother   . Stroke Father   . Hypertension Father   . Stroke Sister   . Hypertension Sister   . Heart disease Maternal Grandfather   . Diabetes Paternal Grandmother   . Heart disease Paternal Grandfather      Review of Systems  Musculoskeletal: Positive for joint pain.  All other systems reviewed and are negative.   Objective:  Physical Exam  Constitutional: She appears well-developed.  HENT:  Head: Normocephalic.  Eyes: Pupils are equal, round, and reactive to light.  Neck: Normal range of motion.  Cardiovascular: Normal rate.   Respiratory: Effort normal.  Neurological: She is alert.  Skin: Skin is warm.  Psychiatric: She has a normal mood and affect.  Left knee demonstrates reasonable range of motion with intact skin palpable pedal pulses no groin pain with internal/external rotation of the leg.  Collateral and cruciate ligaments are stable.  Mild effusion is present.  Medial greater than lateral joint line tenderness is present  Vital signs in last 24 hours: Temp:  [98.9 F (37.2 C)] 98.9 F (37.2 C) (09/04 0625) Pulse Rate:  [60] 60 (09/04 0625) Resp:  [20] 20 (09/04 0625) BP: (142)/(54) 142/54 (09/04 0625) SpO2:  [100 %] 100 % (09/04 0625)  Labs:   Estimated body mass index is 49.35 kg/m as calculated from the following:   Height as of 02/28/17: 5\' 2"  (1.575 m).   Weight as of 02/28/17: 269 lb 12.8 oz (122.4  kg).   Imaging Review Plain radiographs demonstrate severe degenerative joint disease of the left knee(s). The overall alignment ismild varus. The bone quality appears to be good for age and reported activity level.  Assessment/Plan:  End stage arthritis, left knee   The patient history, physical examination, clinical judgment of the provider and imaging studies are consistent with end stage degenerative joint disease of the left knee(s) and total knee arthroplasty is deemed medically necessary. The treatment options including medical management, injection therapy arthroscopy and arthroplasty were discussed at length. The risks and benefits of total knee arthroplasty were presented and reviewed. The risks due to aseptic loosening, infection, stiffness, patella tracking problems, thromboembolic complications and other imponderables were discussed. The patient acknowledged the explanation, agreed to proceed with the plan and consent was signed. Patient is being admitted for inpatient treatment for surgery, pain control, PT, OT, prophylactic antibiotics, VTE prophylaxis, progressive ambulation and ADL's and discharge planning. The patient is planning to be discharged home with home health services

## 2017-03-07 NOTE — Anesthesia Procedure Notes (Signed)
Spinal  Patient location during procedure: OR Start time: 03/07/2017 7:50 AM End time: 03/07/2017 7:55 AM Staffing Anesthesiologist: Linna Caprice, Janani Chamber Performed: anesthesiologist  Preanesthetic Checklist Completed: patient identified, site marked, surgical consent, pre-op evaluation, timeout performed, IV checked, risks and benefits discussed and monitors and equipment checked Spinal Block Patient position: sitting Prep: Betadine and site prepped and draped Patient monitoring: heart rate, cardiac monitor, continuous pulse ox and blood pressure Approach: right paramedian Location: L3-4 Injection technique: single-shot Needle Needle type: Tuohy  Needle gauge: 22 G Needle length: 12.7 cm Additional Notes 15 mg 1% tetracaine injected easily

## 2017-03-07 NOTE — Progress Notes (Signed)
Orthopedic Tech Progress Note Patient Details:  Julia Mcfarland July 25, 1945 [id number]  CPM Left Knee CPM Left Knee: On Left Knee Flexion (Degrees): 40 Left Knee Extension (Degrees): [place] 03/07/2017, 11:45 AM

## 2017-03-07 NOTE — Brief Op Note (Signed)
03/07/2017  11:20 AM  PATIENT:  [name]  71 y.o. female  PRE-OPERATIVE DIAGNOSIS:  LEFT KNEE OSTEOARTHRITIS  POST-OPERATIVE DIAGNOSIS:  LEFT KNEE OSTEOARTHRITIS  PROCEDURE:  Procedure(s): LEFT TOTAL KNEE ARTHROPLASTY  SURGEON:  Surgeon(s): Marlou Sa, Tonna Corner, MD  ASSISTANT: Laure Kidney RNFA  ANESTHESIA:   spinal  EBL: 75 ml    Total I/O In: 1000 [I.V.:1000] Out: 275 [Urine:200; Blood:75]  BLOOD ADMINISTERED: none  DRAINS: none   LOCAL MEDICATIONS USED:  Marcaine mso4 clonidine  SPECIMEN:  No Specimen  COUNTS:  YES  TOURNIQUET:   Total Tourniquet Time Documented: Thigh (Left) - 113 minutes Total: Thigh (Left) - 113 minutes   DICTATION: .Other Dictation: Dictation Number [id number]  PLAN OF CARE: Admit to inpatient   PATIENT DISPOSITION:  PACU - hemodynamically stable

## 2017-03-07 NOTE — Evaluation (Addendum)
Physical Therapy Evaluation Patient Details Name: Julia Mcfarland MRN: [id number] DOB: Sep 25, 1945 Today's Date: 03/07/2017   History of Present Illness  Pt is a 71 y.o. female now s/p elective L TKA on 03/07/2017. Pertinent PMH includes R TKA (2010), arthritis, heart murmur.     Clinical Impression  Pt presents with pain and an overall decrease in functional mobility secondary to above. PTA, pt indep with all mobility and lives at home with husband and grandchildren. Educ on WBAT precautions, positioning, therex, and importance of mobility. Today, pt able to transfer and amb with RW and min guard; further mobility limited by pain and nausea. Pt would benefit from continued acute PT services to maximize functional mobility and independence prior to d/c with HHPT.      Follow Up Recommendations DC plan and follow up therapy as arranged by surgeon;Home health PT    Equipment Recommendations  Rolling walker with 5" wheels;3in1 (PT)    Recommendations for Other Services       Precautions / Restrictions Precautions Precautions: Knee Precaution Booklet Issued: No Precaution Comments: Educ on resting knee in ext Restrictions Weight Bearing Restrictions: Yes LLE Weight Bearing: Weight bearing as tolerated      Mobility  Bed Mobility Overal bed mobility: Needs Assistance Bed Mobility: Supine to Sit     Supine to sit: Supervision;HOB elevated     General bed mobility comments: Increased time and effort, but no physical assist required; cues for technique. Increased nausea and clear vomit with bed mobility; RN aware  Transfers Overall transfer level: Needs assistance Equipment used: Rolling walker (2 wheeled) Transfers: Sit to/from Stand Sit to Stand: Min guard         General transfer comment: Min guard for balance; cues for technique with RW  Ambulation/Gait Ambulation/Gait assistance: Min guard Ambulation Distance (Feet): 50 Feet Assistive device: Rolling walker (2  wheeled) Gait Pattern/deviations: Step-to pattern;Decreased stance time - left;Antalgic;Wide base of support Gait velocity: Decreased Gait velocity interpretation: <1.8 ft/sec, indicative of risk for recurrent falls General Gait Details: Amb in room with RW and min guard for balance; cues for technique with RW. Further mobility limited secondary to increased pain and fatigue  Stairs            Wheelchair Mobility    Modified Rankin (Stroke Patients Only)       Balance Overall balance assessment: Needs assistance Sitting-balance support: No upper extremity supported;Feet supported Sitting balance-Leahy Scale: Good     Standing balance support: Bilateral upper extremity supported;During functional activity Standing balance-Leahy Scale: Poor Standing balance comment: Reliant on BUE support secondary to LLE pain                             Pertinent Vitals/Pain Pain Assessment: 0-10 Pain Score: 6  Pain Location: L knee Pain Descriptors / Indicators: Aching;Sore;Grimacing Pain Intervention(s): Limited activity within patient's tolerance;Monitored during session;RN gave pain meds during session    Home Living Family/patient expects to be discharged to:: Private residence Living Arrangements: Spouse/significant other;Other relatives Available Help at Discharge: Family;Available 24 hours/day Type of Home: House Home Access: Stairs to enter Entrance Stairs-Rails: None Entrance Stairs-Number of Steps: 3 Home Layout: One level Home Equipment: Walker - 2 wheels      Prior Function Level of Independence: Independent               Hand Dominance        Extremity/Trunk Assessment   Upper Extremity Assessment  Upper Extremity Assessment: Overall WFL for tasks assessed    Lower Extremity Assessment Lower Extremity Assessment: LLE deficits/detail LLE Deficits / Details: L hip flexion grossly 3/5 LLE: Unable to fully assess due to pain        Communication   Communication: No difficulties  Cognition Arousal/Alertness: Awake/alert Behavior During Therapy: WFL for tasks assessed/performed Overall Cognitive Status: Within Functional Limits for tasks assessed                                        General Comments      Exercises Total Joint Exercises Ankle Circles/Pumps: AROM;Both;10 reps;Supine Quad Sets: Strengthening;Left;10 reps;Supine Gluteal Sets: Strengthening;Both;10 reps Heel Slides: AAROM;Left;10 reps;Supine Straight Leg Raises: AAROM;Left;10 reps;Supine   Assessment/Plan    PT Assessment Patient needs continued PT services  PT Problem List Decreased strength;Decreased range of motion;Decreased activity tolerance;Decreased balance;Decreased mobility;Decreased knowledge of use of DME;Decreased knowledge of precautions;Pain       PT Treatment Interventions DME instruction;Gait training;Stair training;Functional mobility training;Therapeutic activities;Therapeutic exercise;Balance training;Patient/family education    PT Goals (Current goals can be found in the Care Plan section)  Acute Rehab PT Goals Patient Stated Goal: Return home with grandkids PT Goal Formulation: With patient Time For Goal Achievement: 03/21/17 Potential to Achieve Goals: Good    Frequency 7X/week   Barriers to discharge        Co-evaluation               AM-PAC PT "6 Clicks" Daily Activity  Outcome Measure Difficulty turning over in bed (including adjusting bedclothes, sheets and blankets)?: A Little Difficulty moving from lying on back to sitting on the side of the bed? : A Little Difficulty sitting down on and standing up from a chair with arms (e.g., wheelchair, bedside commode, etc,.)?: A Little Help needed moving to and from a bed to chair (including a wheelchair)?: A Little Help needed walking in hospital room?: A Little Help needed climbing 3-5 steps with a railing? : A Little 6 Click Score: 18     End of Session Equipment Utilized During Treatment: Gait belt Activity Tolerance: Patient tolerated treatment well Patient left: in chair;with call bell/phone within reach Nurse Communication: Mobility status PT Visit Diagnosis: Other abnormalities of gait and mobility (R26.89);Pain Pain - Right/Left: Left Pain - part of body: Knee    Time: 9604-5409 PT Time Calculation (min) (ACUTE ONLY): 40 min   Charges:   PT Evaluation $PT Eval Moderate Complexity: 1 Mod PT Treatments $Gait Training: 8-22 mins $Therapeutic Exercise: 8-22 mins   PT G Codes:       Mabeline Caras, PT, DPT Acute Rehab Services  Pager: [place] 03/07/2017, 4:28 PM

## 2017-03-07 NOTE — Discharge Instructions (Signed)

## 2017-03-07 NOTE — Transfer of Care (Signed)
Immediate Anesthesia Transfer of Care Note  [name]  Procedure(s) Performed: Procedure(s): LEFT TOTAL KNEE ARTHROPLASTY (Left)  Patient Location: PACU  Anesthesia Type:General  Level of Consciousness: awake, alert , oriented and patient cooperative  Airway & Oxygen Therapy: Patient Spontanous Breathing and Patient connected to nasal cannula oxygen  Post-op Assessment: Report given to RN and Post -op Vital signs reviewed and stable  Post vital signs: Reviewed  Last Vitals: 148/74, 68, 14, 100% Vitals:   03/07/17 0625  BP: (!) 142/54  Pulse: 60  Resp: 20  Temp: 37.2 C  SpO2: 100%    Last Pain:  Vitals:   03/07/17 0603  PainSc: 5       Patients Stated Pain Goal: 3 (25/36/64 4034)  Complications: No apparent anesthesia complications

## 2017-03-07 NOTE — Anesthesia Preprocedure Evaluation (Signed)
Anesthesia Evaluation  Patient identified by MRN, date of birth, ID band Patient awake    Reviewed: Allergy & Precautions, NPO status , Patient's Chart, lab work & pertinent test results  Airway Mallampati: II  TM Distance: >3 FB Neck ROM: Full    Dental  (+) Dental Advisory Given, Teeth Intact   Pulmonary    breath sounds clear to auscultation       Cardiovascular  Rhythm:Regular Rate:Normal     Neuro/Psych    GI/Hepatic   Endo/Other    Renal/GU      Musculoskeletal   Abdominal (+) + obese,   Peds  Hematology   Anesthesia Other Findings   Reproductive/Obstetrics                             Anesthesia Physical Anesthesia Plan  ASA: III  Anesthesia Plan: Regional and Spinal   Post-op Pain Management:    Induction: Intravenous  PONV Risk Score and Plan: 1 and Ondansetron and Metaclopromide  Airway Management Planned: Natural Airway and Simple Face Mask  Additional Equipment:   Intra-op Plan:   Post-operative Plan: Extubation in OR  Informed Consent:   Dental advisory given  Plan Discussed with: CRNA and Anesthesiologist  Anesthesia Plan Comments:         Anesthesia Quick Evaluation

## 2017-03-08 ENCOUNTER — Encounter (HOSPITAL_COMMUNITY): Payer: Self-pay | Admitting: Orthopedic Surgery

## 2017-03-08 NOTE — Progress Notes (Signed)
Physical Therapy Treatment Patient Details Name: Julia Mcfarland MRN: [id number] DOB: 08/07/1945 Today's Date: 03/08/2017    History of Present Illness Pt is a 71 y.o. female now s/p elective L TKA on 03/07/2017. Pertinent PMH includes R TKA (2010), arthritis, heart murmur.    PT Comments    Pt demonstrating improved technique with transfers and amb, requiring RW and min guard for both. Continues to require increased time and effort secondary to L knee pain in WB. Reviewed postioning and precautions; CPM range increased to 0-70'. Will continue to follow acutely.    Follow Up Recommendations  DC plan and follow up therapy as arranged by surgeon;Home health PT     Equipment Recommendations  Rolling walker with 5" wheels;3in1 (PT)    Recommendations for Other Services       Precautions / Restrictions Precautions Precautions: Knee Precaution Booklet Issued: No Precaution Comments: Reviewed use of bone foam and no pillow under knee Restrictions Weight Bearing Restrictions: Yes LLE Weight Bearing: Weight bearing as tolerated    Mobility  Bed Mobility Overal bed mobility: Needs Assistance Bed Mobility: Sit to Supine       Sit to supine: Min assist   General bed mobility comments: Received sitting in chair. MinA for LLE back into bed.  Transfers Overall transfer level: Needs assistance Equipment used: Rolling walker (2 wheeled) Transfers: Sit to/from Stand Sit to Stand: Min guard         General transfer comment: Stood from chair and toilet with RW and min guard for balance. Increased time and effort required secondary to LLE pain. Mod indep for pericare standing at toilet with RW.  Ambulation/Gait Ambulation/Gait assistance: Min guard Ambulation Distance (Feet): 130 Feet Assistive device: Rolling walker (2 wheeled) Gait Pattern/deviations: Step-to pattern;Decreased stance time - left;Antalgic;Wide base of support Gait velocity: Decreased Gait velocity interpretation:  <1.8 ft/sec, indicative of risk for recurrent falls General Gait Details: Slow, antalgic gait with slight increase in WB on LLE. Repeated verbal cues to keep RW closer to body with amb. Increased time and effort secondary to L knee pain in WB.   Stairs            Wheelchair Mobility    Modified Rankin (Stroke Patients Only)       Balance Overall balance assessment: Needs assistance Sitting-balance support: Feet supported;No upper extremity supported Sitting balance-Leahy Scale: Good     Standing balance support: Bilateral upper extremity supported Standing balance-Leahy Scale: Poor Standing balance comment: RW for support                            Cognition Arousal/Alertness: Awake/alert Behavior During Therapy: WFL for tasks assessed/performed Overall Cognitive Status: Within Functional Limits for tasks assessed                                        Exercises      General Comments        Pertinent Vitals/Pain Pain Assessment: 0-10 Pain Score: 7  Faces Pain Scale: Hurts little more Pain Location: L knee Pain Descriptors / Indicators: Aching;Sore;Grimacing Pain Intervention(s): Limited activity within patient's tolerance;Monitored during session    Home Living Family/patient expects to be discharged to:: Private residence Living Arrangements: Spouse/significant other;Other relatives Available Help at Discharge: Family;Available 24 hours/day Type of Home: House Home Access: Stairs to enter Entrance Stairs-Rails: None Home Layout:  One level Home Equipment: [place] - 2 wheels;Bedside commode      Prior Function Level of Independence: Independent;Independent with assistive device(s)      Comments: cane for mobility, independent with ADL   PT Goals (current goals can now be found in the care plan section) Acute Rehab PT Goals Patient Stated Goal: Return home with grandkids PT Goal Formulation: With patient Time For Goal  Achievement: 03/21/17 Potential to Achieve Goals: Good Progress towards PT goals: Progressing toward goals    Frequency    7X/week      PT Plan Current plan remains appropriate    Co-evaluation              AM-PAC PT "6 Clicks" Daily Activity  Outcome Measure  Difficulty turning over in bed (including adjusting bedclothes, sheets and blankets)?: A Little Difficulty moving from lying on back to sitting on the side of the bed? : Unable Difficulty sitting down on and standing up from a chair with arms (e.g., wheelchair, bedside commode, etc,.)?: A Little Help needed moving to and from a bed to chair (including a wheelchair)?: A Little Help needed walking in hospital room?: A Little Help needed climbing 3-5 steps with a railing? : A Little 6 Click Score: 16    End of Session Equipment Utilized During Treatment: Gait belt Activity Tolerance: Patient tolerated treatment well Patient left: in bed;in CPM;with call bell/phone within reach Nurse Communication: Mobility status PT Visit Diagnosis: Other abnormalities of gait and mobility (R26.89);Pain Pain - Right/Left: Left Pain - part of body: Knee     Time: 4656-8127 PT Time Calculation (min) (ACUTE ONLY): 30 min  Charges:  $Gait Training: 8-22 mins $Therapeutic Activity: 8-22 mins                    G Codes:      Mabeline Caras, PT, DPT Acute Rehab Services  Pager: [place] 03/08/2017, 2:57 PM

## 2017-03-08 NOTE — Progress Notes (Signed)
Subjective: Patient stable.  Pain controlled.  Patient is in CPM machine   Objective: Vital signs in last 24 hours: Temp:  [97.2 F (36.2 C)-98.7 F (37.1 C)] 98 F (36.7 C) (09/05 0358) Pulse Rate:  [48-80] 60 (09/05 0358) Resp:  [9-19] 15 (09/05 0358) BP: (128-150)/(66-91) 128/66 (09/05 0358) SpO2:  [98 %-100 %] 98 % (09/05 0358)  Intake/Output from previous day: 09/04 0701 - 09/05 0700 In: 1200 [I.V.:1100; IV Piggyback:100] Out: 2125 [Urine:2050; Blood:75] Intake/Output this shift: No intake/output data recorded.  Exam:  Sensation intact distally Dorsiflexion/Plantar flexion intact  Labs: No results for input(s): HGB in the last 72 hours. No results for input(s): WBC, RBC, HCT, PLT in the last 72 hours. No results for input(s): NA, K, CL, CO2, BUN, CREATININE, GLUCOSE, CALCIUM in the last 72 hours. No results for input(s): LABPT, INR in the last 72 hours.  Assessment/Plan: Plan physical therapy today.  Okay to remove Ace wrap today. Possible discharge to home Thursday or Friday depending on physical therapy assessment   G Alphonzo Severance 03/08/2017, 7:47 AM

## 2017-03-08 NOTE — Op Note (Signed)
NAME:  Julia Mcfarland, Julia Mcfarland                     ACCOUNT NO.:  MEDICAL RECORD NO.:  [id number]  LOCATION:                                 FACILITY:  PHYSICIAN:  Anderson Malta, M.D.         DATE OF BIRTH:  DATE OF PROCEDURE: DATE OF DISCHARGE:                              OPERATIVE REPORT   PREOPERATIVE DIAGNOSIS:  Left knee arthritis.  POSTOPERATIVE DIAGNOSIS:  Left knee arthritis.  PROCEDURE:  Left total knee replacement using Stryker cemented posterior cruciate sacrificing 4 femur, 4 tibia with 100 mm noncemented stem, 13 mm polyethylene spacer, 29 mm 3-peg cemented patella.  SURGEON:  Anderson Malta, M.D.  ASSISTANT:  Laure Kidney, RNFA.  INDICATIONS:  Julia Mcfarland is a 71 year old patient with end-stage left knee arthritis, who presents for operative management after explanation of risks and benefits.  DESCRIPTION OF PROCEDURE:  The patient was brought to the operating room where spinal anesthetic was induced.  Preoperative antibiotics administered.  Time-out was called.  Left leg then prescrubbed with alcohol and Betadine and allowed to air dry, prepped with DuraPrep solution and draped in a sterile manner.  She had about a 5 degree flexion contracture and flexed only to 90 degrees.  Following sterile prepping and draping, Ioban was used to cover the operative field.  Time- out was called.  Leg was elevated and exsanguinated with the Esmarch wrap.  Tourniquet was inflated to 325 mmHg.  Total tourniquet time 130 minutes.  The anterior approach to knee was made.  Skin and subcutaneous tissue were sharply divided.  Median parapatellar approach was made and marked with a #1 Vicryl suture.  The patella was partially everted. Soft tissue then dissected off the medial side around the tibial plateau.  Osteophytes were removed.  ACL removed.  Anterior horn of the lateral meniscus removed for placement of retractors.  Intramedullary alignment was then used to make a cut off the tibia, which was  9 mm off the least affected lateral side.  Posterior structures and collateral ligaments were protected.  At this time, the intramedullary line was then used to make a cut on the distal femur 8 mm.  This was revisited to 10 mm.  Chamfer and box cuts were then made.  With these cuts made, the knee achieved full extension with an 11 mm spacer.  The tibia was then keel punched.  Then with the trial components in position, an 11 and 13 mm spacer was placed.  The patella was then cut from 24 down to 12.  Had significant lateral facet wear.  A 3-peg 29 mm patella was placed.  With trial components in position, the patient had good alignment and good flexion to about 95 degrees without liftoff and there was some liftoff past 95.  Flexion past this was difficult due to her body habitus. Trial components removed.  Exparel injection performed in the capsule. Tranexamic acid then placed in the left knee for 3 minutes.  Then, the components were cemented into position with excess cement removed.  Same stability parameters were maintained.  Tourniquet was released. Bleeding points encountered and controlled using electrocautery. Thorough irrigation again  performed and then, the arthrotomy was closed using #1 Vicryl suture followed by interrupted inverted 0 Vicryl suture, 2-0 Vicryl suture, and 3-0 Monocryl.  Aquacel dressing placed.  Bulky dressing and knee immobilizer also placed.  The patient tolerated the procedure well without immediate complications and transferred to recovery room in stable condition.     Anderson Malta, M.D.     GSD/MEDQ  D:  03/07/2017  T:  03/07/2017  Job:  [id number]

## 2017-03-08 NOTE — Plan of Care (Signed)
Problem: Pain Managment: Goal: General experience of comfort will improve Outcome: Progressing Pt pain being managed soley with PO pain medications. Pt able to increase time in between doses.

## 2017-03-08 NOTE — Progress Notes (Signed)
Physical Therapy Treatment Patient Details Name: Julia Mcfarland MRN: [id number] DOB: Jun 09, 1946 Today's Date: 03/08/2017    History of Present Illness Pt is a 71 y.o. female now s/p elective L TKA on 03/07/2017. Pertinent PMH includes R TKA (2010), arthritis, heart murmur.    PT Comments    Pt slowly progressing with mobility. Able to transfer and amb 150' with RW and min guard for safety; requiring increased time and effort for all mobility secondary to pain. Reviewed precautions, therex, and positioning. Will continue to follow acutely.    Follow Up Recommendations  DC plan and follow up therapy as arranged by surgeon;Home health PT     Equipment Recommendations  Rolling walker with 5" wheels;3in1 (PT)    Recommendations for Other Services       Precautions / Restrictions Precautions Precautions: Knee Precaution Booklet Issued: No Precaution Comments: Educ on resting knee in ext Restrictions Weight Bearing Restrictions: Yes LLE Weight Bearing: Weight bearing as tolerated    Mobility  Bed Mobility Overal bed mobility: Needs Assistance Bed Mobility: Supine to Sit     Supine to sit: Min assist     General bed mobility comments: Increased time and effort for bed mobility with bed flat, requiring minA to assist LLE to EOB  Transfers Overall transfer level: Needs assistance Equipment used: Rolling walker (2 wheeled) Transfers: Sit to/from Stand Sit to Stand: Min guard         General transfer comment: Stood x2 from bed and toilet with RW and min guard for safety; requiring increased time and effort. Cues for technique with RW  Ambulation/Gait Ambulation/Gait assistance: Min guard Ambulation Distance (Feet): 150 Feet Assistive device: Rolling walker (2 wheeled) Gait Pattern/deviations: Step-to pattern;Decreased stance time - left;Antalgic;Wide base of support Gait velocity: Decreased Gait velocity interpretation: <1.8 ft/sec, indicative of risk for recurrent  falls General Gait Details: Slow, antalgic gait with RW and min guard for safety; pt requires increased time with decreased WB to L knee secondary to pain   Stairs            Wheelchair Mobility    Modified Rankin (Stroke Patients Only)       Balance Overall balance assessment: Needs assistance Sitting-balance support: No upper extremity supported;Feet supported Sitting balance-Leahy Scale: Good     Standing balance support: Single extremity supported;Bilateral upper extremity supported;During functional activity Standing balance-Leahy Scale: Poor Standing balance comment: Able to stand with single UE support on RW for pericare at toilet                            Cognition Arousal/Alertness: Awake/alert Behavior During Therapy: WFL for tasks assessed/performed Overall Cognitive Status: Within Functional Limits for tasks assessed                                        Exercises Total Joint Exercises Ankle Circles/Pumps: AROM;Both;10 reps;Seated Quad Sets: Strengthening;Left;10 reps;Seated Gluteal Sets: Strengthening;Both;10 reps;Seated Short Arc Quad: AROM;Left;10 reps;Seated Knee Flexion: AAROM;Left;10 reps;Seated    General Comments        Pertinent Vitals/Pain Pain Assessment: 0-10 Pain Score: 6  Pain Location: L knee Pain Intervention(s): Limited activity within patient's tolerance;Monitored during session    Home Living                      Prior Function  PT Goals (current goals can now be found in the care plan section) Acute Rehab PT Goals Patient Stated Goal: Return home with grandkids PT Goal Formulation: With patient Time For Goal Achievement: 03/21/17 Potential to Achieve Goals: Good Progress towards PT goals: Progressing toward goals    Frequency    7X/week      PT Plan Current plan remains appropriate    Co-evaluation              AM-PAC PT "6 Clicks" Daily Activity   Outcome Measure  Difficulty turning over in bed (including adjusting bedclothes, sheets and blankets)?: A Little Difficulty moving from lying on back to sitting on the side of the bed? : Unable Difficulty sitting down on and standing up from a chair with arms (e.g., wheelchair, bedside commode, etc,.)?: A Little Help needed moving to and from a bed to chair (including a wheelchair)?: A Little Help needed walking in hospital room?: A Little Help needed climbing 3-5 steps with a railing? : A Little 6 Click Score: 16    End of Session Equipment Utilized During Treatment: Gait belt Activity Tolerance: Patient tolerated treatment well Patient left: in chair;with call bell/phone within reach Nurse Communication: Mobility status PT Visit Diagnosis: Other abnormalities of gait and mobility (R26.89);Pain Pain - Right/Left: Left Pain - part of body: Knee     Time: 5615-3794 PT Time Calculation (min) (ACUTE ONLY): 39 min  Charges:  $Gait Training: 8-22 mins $Therapeutic Exercise: 8-22 mins $Therapeutic Activity: 8-22 mins                    G Codes:      Mabeline Caras, PT, DPT Acute Rehab Services  Pager: [place] 03/08/2017, 10:27 AM

## 2017-03-08 NOTE — Evaluation (Signed)
Occupational Therapy Evaluation Patient Details Name: Julia Mcfarland MRN: [id number] DOB: [birth year] Today's Date: 03/08/2017    History of Present Illness Pt is a 71 y.o. female now s/p elective L TKA on 03/07/2017. Pertinent PMH includes R TKA (2010), arthritis, heart murmur.    Clinical Impression   Pt reports she was independent with ADL PTA. Currently pt requires min guard for functional mobility and max assist for LB ADL. Pt planning to d/c home with 24/7 assist from family. Pt would benefit from continued skilled OT to address established goals.    Follow Up Recommendations  DC plan and follow up therapy as arranged by surgeon    Equipment Recommendations  None recommended by OT    Recommendations for Other Services       Precautions / Restrictions Precautions Precautions: Knee Precaution Booklet Issued: No Precaution Comments: Reviewed use of bone foam and no pillow under knee Restrictions Weight Bearing Restrictions: Yes LLE Weight Bearing: Weight bearing as tolerated      Mobility Bed Mobility      General bed mobility comments: Pt OOB in chair upon arrival  Transfers Overall transfer level: Needs assistance Equipment used: Rolling walker (2 wheeled) Transfers: Sit to/from Stand Sit to Stand: Min guard         General transfer comment: increased time and effort. good hand placement and technique. min guard for safety but no physical assist required    Balance Overall balance assessment: Needs assistance Sitting-balance support: Feet supported;No upper extremity supported Sitting balance-Leahy Scale: Good     Standing balance support: Bilateral upper extremity supported Standing balance-Leahy Scale: Poor Standing balance comment: RW for support                           ADL either performed or assessed with clinical judgement   ADL Overall ADL's : Needs assistance/impaired Eating/Feeding: Set up;Sitting   Grooming: Set  up;Supervision/safety;Sitting   Upper Body Bathing: Minimal assistance;Sitting   Lower Body Bathing: Maximal assistance;Sit to/from stand   Upper Body Dressing : Set up;Supervision/safety;Sitting   Lower Body Dressing: Maximal assistance;Sit to/from stand   Toilet Transfer: Min guard;Ambulation;RW Toilet Transfer Details (indicate cue type and reason): simulated by sit to stand from chair with functional mobility in room         Functional mobility during ADLs: Min guard;Rolling walker       Vision         Perception     Praxis      Pertinent Vitals/Pain Pain Assessment: Faces Pain Score: 6  Faces Pain Scale: Hurts little more Pain Location: L knee with weight bearing Pain Descriptors / Indicators: Aching;Sore Pain Intervention(s): Limited activity within patient's tolerance;Monitored during session;Repositioned;Ice applied     Hand Dominance     Extremity/Trunk Assessment Upper Extremity Assessment Upper Extremity Assessment: Overall WFL for tasks assessed   Lower Extremity Assessment Lower Extremity Assessment: Defer to PT evaluation   Cervical / Trunk Assessment Cervical / Trunk Assessment: Normal   Communication Communication Communication: No difficulties   Cognition Arousal/Alertness: Awake/alert Behavior During Therapy: WFL for tasks assessed/performed Overall Cognitive Status: Within Functional Limits for tasks assessed                                     General Comments       Exercises    Shoulder Instructions  Home Living Family/patient expects to be discharged to:: Private residence Living Arrangements: Spouse/significant other;Other relatives Available Help at Discharge: Family;Available 24 hours/day Type of Home: House Home Access: Stairs to enter [employer] of Steps: 3 Entrance Stairs-Rails: None Home Layout: One level     Bathroom Shower/Tub: Tub/shower unit;Curtain   Bathroom Toilet:  Handicapped height     Home Equipment: Environmental consultant - 2 wheels;Bedside commode          Prior Functioning/Environment Level of Independence: Independent;Independent with assistive device(s)        Comments: cane for mobility, independent with ADL        OT Problem List: Decreased strength;Decreased range of motion;Decreased activity tolerance;Impaired balance (sitting and/or standing);Decreased knowledge of use of DME or AE;Decreased knowledge of precautions;Pain;Obesity      OT Treatment/Interventions: Self-care/ADL training;Therapeutic exercise;DME and/or AE instruction;Therapeutic activities;Patient/family education;Balance training    OT Goals(Current goals can be found in the care plan section) Acute Rehab OT Goals Patient Stated Goal: Return home with grandkids OT Goal Formulation: With patient Time For Goal Achievement: 03/22/17 Potential to Achieve Goals: Good ADL Goals Pt Will Perform Lower Body Bathing: with supervision;sit to/from stand Pt Will Perform Lower Body Dressing: with supervision;sit to/from stand Pt Will Transfer to Toilet: with supervision;ambulating;bedside commode Pt Will Perform Toileting - Clothing Manipulation and hygiene: with supervision;sit to/from stand Pt Will Perform Tub/Shower Transfer: with supervision;Tub transfer;ambulating;3 in 1;rolling walker  OT Frequency: Min 2X/week   Barriers to D/C:            Co-evaluation              AM-PAC PT "6 Clicks" Daily Activity     Outcome Measure Help from another person eating meals?: None Help from another person taking care of personal grooming?: A Little Help from another person toileting, which includes using toliet, bedpan, or urinal?: A Little Help from another person bathing (including washing, rinsing, drying)?: A Lot Help from another person to put on and taking off regular upper body clothing?: A Little Help from another person to put on and taking off regular lower body clothing?: A  Lot 6 Click Score: 17   End of Session Equipment Utilized During Treatment: Rolling walker CPM Left Knee CPM Left Knee: Off  Activity Tolerance: Patient tolerated treatment well Patient left: in chair;with call bell/phone within reach  OT Visit Diagnosis: Other abnormalities of gait and mobility (R26.89);Unsteadiness on feet (R26.81);Pain Pain - Right/Left: Left Pain - part of body: Knee                Time: 1141-1156 OT Time Calculation (min): 15 min Charges:  OT General Charges $OT Visit: 1 Visit OT Evaluation $OT Eval Moderate Complexity: 1 Mod G-Codes:     J[place]oeur A. Ulice Brilliant, M.S., OTR/L Pager: [place] 03/08/2017, 12:01 PM

## 2017-03-09 ENCOUNTER — Encounter (HOSPITAL_COMMUNITY): Payer: Self-pay | Admitting: General Practice

## 2017-03-09 NOTE — Progress Notes (Signed)
Subjective: Patient stable. Pain control.   Objective: Vital signs in last 24 hours: Temp:  [100.7 F (38.2 C)-100.8 F (38.2 C)] 100.7 F (38.2 C) (09/06 0446) Pulse Rate:  [87-94] 87 (09/06 0446) Resp:  [16-18] 18 (09/06 0446) BP: (114-122)/(65-68) 114/65 (09/06 0446) SpO2:  [93 %-95 %] 93 % (09/06 0446)  Intake/Output from previous day: 09/05 0701 - 09/06 0700 In: 600 [P.O.:600] Out: -  Intake/Output this shift: No intake/output data recorded.  Exam:  Dorsiflexion/Plantar flexion intact  Labs: No results for input(s): HGB in the last 72 hours. No results for input(s): WBC, RBC, HCT, PLT in the last 72 hours. No results for input(s): NA, K, CL, CO2, BUN, CREATININE, GLUCOSE, CALCIUM in the last 72 hours. No results for input(s): LABPT, INR in the last 72 hours.  Assessment/Plan: Plan at this time is for discharge tomorrow to home.  Continue with physical therapy.  Patient is doing well in terms of mobilization.   [name] 03/09/2017, 1:10 PM

## 2017-03-09 NOTE — Progress Notes (Signed)
Physical Therapy Treatment Patient Details Name: Julia Mcfarland MRN: [id number] DOB: Aug 19, 1945 Today's Date: 03/09/2017    History of Present Illness Pt is a 71 y.o. female now s/p elective L TKA on 03/07/2017. Pertinent PMH includes R TKA (2010), arthritis, heart murmur.    PT Comments    Pt continues to require significant time and effort for transfers and amb with RW, but requires no physical assist; min guard for balance and safety with mobility. Pt states she will have assist and supervision at home as needed. Will plan for stair training tomorrow. Reviewed therex and positioning; CPM increased to 0-70'. Will continue to follow acutely.    Follow Up Recommendations  DC plan and follow up therapy as arranged by surgeon;Home health PT     Equipment Recommendations       Recommendations for Other Services       Precautions / Restrictions Precautions Precautions: Knee Restrictions Weight Bearing Restrictions: Yes LLE Weight Bearing: Weight bearing as tolerated    Mobility  Bed Mobility Overal bed mobility: Needs Assistance Bed Mobility: Sit to Supine       Sit to supine: Mod assist   General bed mobility comments: ModA LLE into bed; pt able to scoot mod indep with BUE use of bedrails  Transfers Overall transfer level: Needs assistance Equipment used: Rolling walker (2 wheeled) Transfers: Sit to/from Stand Sit to Stand: Min guard         General transfer comment: Stood x2 from bed and toilet with RW and min guard for balance. Requires cues for handplacement and technique with RW, but no physical assist needed. Significant time and effort with transfers  Ambulation/Gait Ambulation/Gait assistance: Min guard Ambulation Distance (Feet): 40 Feet Assistive device: Rolling walker (2 wheeled) Gait Pattern/deviations: Step-to pattern;Decreased stance time - left;Antalgic;Wide base of support Gait velocity: Decreased Gait velocity interpretation: <1.8 ft/sec, indicative  of risk for recurrent falls General Gait Details: Very slow, antalgic gait with significant time and effort. Min guard for balance, but no physical assist required. Continues to require cues for technique with RW and to keep it close to body for safety   Stairs            Wheelchair Mobility    Modified Rankin (Stroke Patients Only)       Balance Overall balance assessment: Needs assistance Sitting-balance support: Feet supported;No upper extremity supported Sitting balance-Leahy Scale: Good     Standing balance support: Single extremity supported;Bilateral upper extremity supported;During functional activity   Standing balance comment: Able to perform pericare standing at toilet with single UE support on RW                            Cognition Arousal/Alertness: Awake/alert Behavior During Therapy: [facility] for tasks assessed/performed Overall Cognitive Status: Within Functional Limits for tasks assessed                                        Exercises Total Joint Exercises Ankle Circles/Pumps: AROM;Both;10 reps;Supine Quad Sets: Strengthening;Left;10 reps;Supine Heel Slides: AAROM;Left;10 reps;Supine    General Comments        Pertinent Vitals/Pain Pain Assessment: 0-10 Pain Score: 6  Faces Pain Scale: Hurts little more Pain Location: L knee Pain Descriptors / Indicators: Aching;Sore Pain Intervention(s): Monitored during session;Limited activity within patient's tolerance    Home Living Family/patient expects to be  discharged to:: Private residence Living Arrangements: Spouse/significant other                  Prior Function            PT Goals (current goals can now be found in the care plan section) Acute Rehab PT Goals Patient Stated Goal: Return home with grandkids PT Goal Formulation: With patient Time For Goal Achievement: 03/21/17 Potential to Achieve Goals: Good Progress towards PT goals: Progressing toward  goals    Frequency    7X/week      PT Plan Current plan remains appropriate    Co-evaluation              AM-PAC PT "6 Clicks" Daily Activity  Outcome Measure  Difficulty turning over in bed (including adjusting bedclothes, sheets and blankets)?: A Little Difficulty moving from lying on back to sitting on the side of the bed? : Unable Difficulty sitting down on and standing up from a chair with arms (e.g., wheelchair, bedside commode, etc,.)?: A Little Help needed moving to and from a bed to chair (including a wheelchair)?: A Little Help needed walking in hospital room?: A Little Help needed climbing 3-5 steps with a railing? : A Little 6 Click Score: 16    End of Session Equipment Utilized During Treatment: Gait belt Activity Tolerance: Patient tolerated treatment well Patient left: in bed;with call bell/phone within reach;in CPM Nurse Communication: Mobility status PT Visit Diagnosis: Other abnormalities of gait and mobility (R26.89);Pain Pain - Right/Left: Left Pain - part of body: Knee     Time: 6440-3474 PT Time Calculation (min) (ACUTE ONLY): 24 min  Charges:  $Gait Training: 8-22 mins $Therapeutic Exercise: 8-22 mins                    G Codes:      Mabeline Caras, PT, DPT Acute Rehab Services  Pager: [place] 03/09/2017, 3:15 PM

## 2017-03-09 NOTE — Progress Notes (Signed)
Physical Therapy Treatment Patient Details Name: Julia Mcfarland MRN: [id number] DOB: 1946-04-05 Today's Date: 03/09/2017    History of Present Illness Pt is a 71 y.o. female now s/p elective L TKA on 03/07/2017. Pertinent PMH includes R TKA (2010), arthritis, heart murmur.    PT Comments    Pt slowly progressing with mobility. Requiring modA for bed mobility, but able to transfer and amb with RW and min guard for balance. Significant antalgic gait with decreased gait speed, requiring increased time and effort for all mobility. Reviewed importance of positioning to regain maximum knee ROM. Will plan to continue gait training and perform therex this afternoon.     Follow Up Recommendations  DC plan and follow up therapy as arranged by surgeon;Home health PT     Equipment Recommendations  Rolling walker with 5" wheels;3in1 (PT)    Recommendations for Other Services       Precautions / Restrictions Precautions Precautions: Knee Precaution Booklet Issued: No Precaution Comments: Reviewed use of bone foam and no pillow under knee Restrictions Weight Bearing Restrictions: Yes LLE Weight Bearing: Weight bearing as tolerated    Mobility  Bed Mobility Overal bed mobility: Needs Assistance Bed Mobility: Supine to Sit     Supine to sit: Mod assist;HOB elevated     General bed mobility comments: ModA to assist LLE to EOB, requiring significant increased time and effort.   Transfers Overall transfer level: Needs assistance Equipment used: Rolling walker (2 wheeled) Transfers: Sit to/from Stand Sit to Stand: Min guard;From elevated surface         General transfer comment: Able to stand on 3rd attempt with RW and elevated surface; required min guard for balance and cues for hand placement, but no physical assist. Significant time and effort. Min guard for pericare while standing at toilet  Ambulation/Gait Ambulation/Gait assistance: Min guard Ambulation Distance (Feet): 100  Feet Assistive device: Rolling walker (2 wheeled) Gait Pattern/deviations: Step-to pattern;Decreased stance time - left;Antalgic;Wide base of support Gait velocity: Decreased Gait velocity interpretation: <1.8 ft/sec, indicative of risk for recurrent falls General Gait Details: Very slow, antalgic gait, but able to increase WB on LLE with cues. Significant time and increased effort with amb, requiring 2x standing rest break, but no physical assist required   Stairs            Wheelchair Mobility    Modified Rankin (Stroke Patients Only)       Balance Overall balance assessment: Needs assistance Sitting-balance support: Feet supported;No upper extremity supported Sitting balance-Leahy Scale: Good     Standing balance support: Bilateral upper extremity supported Standing balance-Leahy Scale: Poor Standing balance comment: RW for support                            Cognition Arousal/Alertness: Awake/alert Behavior During Therapy: WFL for tasks assessed/performed Overall Cognitive Status: Within Functional Limits for tasks assessed                                        Exercises      General Comments        Pertinent Vitals/Pain Pain Assessment: 0-10 Pain Score: 6  Pain Location: L knee Pain Descriptors / Indicators: Aching;Sore;Grimacing Pain Intervention(s): Limited activity within patient's tolerance;Monitored during session    Home Living  Prior Function            PT Goals (current goals can now be found in the care plan section) Acute Rehab PT Goals Patient Stated Goal: Return home with grandkids PT Goal Formulation: With patient Time For Goal Achievement: 03/21/17 Potential to Achieve Goals: Good Progress towards PT goals: Progressing toward goals    Frequency    7X/week      PT Plan Current plan remains appropriate    Co-evaluation              AM-PAC PT "6 Clicks" Daily  Activity  Outcome Measure  Difficulty turning over in bed (including adjusting bedclothes, sheets and blankets)?: A Little Difficulty moving from lying on back to sitting on the side of the bed? : Unable Difficulty sitting down on and standing up from a chair with arms (e.g., wheelchair, bedside commode, etc,.)?: A Little Help needed moving to and from a bed to chair (including a wheelchair)?: A Little Help needed walking in hospital room?: A Little Help needed climbing 3-5 steps with a railing? : A Little 6 Click Score: 16    End of Session Equipment Utilized During Treatment: Gait belt Activity Tolerance: Patient tolerated treatment well Patient left: in chair;with call bell/phone within reach;Other (comment) (knee ext in bone foam) Nurse Communication: Mobility status PT Visit Diagnosis: Other abnormalities of gait and mobility (R26.89);Pain Pain - Right/Left: Left Pain - part of body: Knee     Time: 7341-9379 PT Time Calculation (min) (ACUTE ONLY): 39 min  Charges:  $Gait Training: 8-22 mins $Therapeutic Activity: 23-37 mins                    G Codes:      Mabeline Caras, PT, DPT Acute Rehab Services  Pager: [place] 03/09/2017, 10:55 AM

## 2017-03-09 NOTE — Progress Notes (Signed)
PT Cancellation Note  Patient Details Name: Julia Mcfarland MRN: [id number] DOB: 05-30-1946   Cancelled Treatment:    Reason Eval/Treat Not Completed: Other (comment). Pt eating breakfast and requests PT come back later. Will follow-up for A.M. treatment session as time allows.  Mabeline Caras, PT, DPT Acute Rehab Services  Pager: [place] 03/09/2017, 9:29 AM

## 2017-03-09 NOTE — Progress Notes (Signed)
Occupational Therapy Treatment Patient Details Name: Julia Mcfarland MRN: [id number] DOB: 12-20-1945 Today's Date: 03/09/2017    History of present illness Pt is a 71 y.o. female now s/p elective L TKA on 03/07/2017. Pertinent PMH includes R TKA (2010), arthritis, heart murmur.    OT comments  Educated pt on tub transfer technique with 3 in 1; pt reports she plans to sponge bathe initially then will have husband to assist with tub transfers. D/c plan remains appropriate. Will continue to follow acutely.   Follow Up Recommendations  DC plan and follow up therapy as arranged by surgeon    Equipment Recommendations  None recommended by OT    Recommendations for Other Services      Precautions / Restrictions Precautions Precautions: Knee Precaution Booklet Issued: No Precaution Comments: Reviewed use of bone foam and no pillow under knee Restrictions Weight Bearing Restrictions: Yes LLE Weight Bearing: Weight bearing as tolerated       Mobility Bed Mobility      General bed mobility comments: Pt OOB in chair upon arrival.  Transfers     Balance                            ADL either performed or assessed with clinical judgement   ADL Overall ADL's : Needs assistance/impaired                                   Tub/Shower Transfer Details (indicate cue type and reason): Educated pt on tub transfer technique with use of 3 in 1 as a seat. Pt verbalized understanding but declined to practice at this time. Pt reports she plans to sponge bathe initially. Pt reports husband will be present when she attempts tub transfer at home.          Vision       Perception     Praxis      Cognition Arousal/Alertness: Awake/alert Behavior During Therapy: WFL for tasks assessed/performed Overall Cognitive Status: Within Functional Limits for tasks assessed                                          Exercises     Shoulder Instructions        General Comments      Pertinent Vitals/ Pain       Pain Assessment: Faces Pain Score: 6  Faces Pain Scale: Hurts little more Pain Location: L knee Pain Descriptors / Indicators: Aching;Sore Pain Intervention(s): Monitored during session;Limited activity within patient's tolerance  Home Living                                          Prior Functioning/Environment              Frequency  Min 2X/week        Progress Toward Goals  OT Goals(current goals can now be found in the care plan section)  Progress towards OT goals: Progressing toward goals  Acute Rehab OT Goals Patient Stated Goal: Return home with grandkids OT Goal Formulation: With patient  Plan Discharge plan remains appropriate    Co-evaluation  AM-PAC PT "6 Clicks" Daily Activity     Outcome Measure   Help from another person eating meals?: None Help from another person taking care of personal grooming?: None Help from another person toileting, which includes using toliet, bedpan, or urinal?: A Little Help from another person bathing (including washing, rinsing, drying)?: A Lot Help from another person to put on and taking off regular upper body clothing?: None Help from another person to put on and taking off regular lower body clothing?: A Lot 6 Click Score: 19    End of Session CPM Left Knee CPM Left Knee: Off  OT Visit Diagnosis: Other abnormalities of gait and mobility (R26.89);Unsteadiness on feet (R26.81);Pain Pain - Right/Left: Left Pain - part of body: Knee   Activity Tolerance Patient tolerated treatment well;Patient limited by pain   Patient Left in chair;with call bell/phone within reach   Nurse Communication          Time: 3435-6861 OT Time Calculation (min): 13 min  Charges: OT General Charges $OT Visit: 1 Visit OT Treatments $Self Care/Home Management : 8-22 mins  [place] A. Ulice Brilliant, M.S., OTR/L Pager:  [employer] 03/09/2017, 2:16 PM

## 2017-03-10 MED ORDER — OXYCODONE HCL 5 MG PO TABS: 5.0000 mg | ORAL_TABLET | ORAL | 0 refills | Status: DC | PRN

## 2017-03-10 MED ORDER — METHOCARBAMOL 500 MG PO TABS: 500.0000 mg | ORAL_TABLET | Freq: Three times a day (TID) | ORAL | 0 refills | Status: DC | PRN

## 2017-03-10 MED ORDER — DOCUSATE SODIUM 100 MG PO CAPS: 100.0000 mg | ORAL_CAPSULE | Freq: Two times a day (BID) | ORAL | 0 refills | Status: DC

## 2017-03-10 MED ORDER — RIVAROXABAN 10 MG PO TABS: 10.0000 mg | ORAL_TABLET | Freq: Every day | ORAL | 0 refills | Status: DC

## 2017-03-10 MED FILL — Sodium Chloride Flush IV Soln 0.9%: INTRAVENOUS | Qty: 20 | Status: AC

## 2017-03-10 NOTE — Care Management Important Message (Signed)
Important Message  Patient Details  Name: Julia Mcfarland MRN: [id number] Date of Birth: Dec 24, 1945   Medicare Important Message Given:  Yes    Orbie Pyo 03/10/2017, 11:36 AM

## 2017-03-10 NOTE — Progress Notes (Signed)
Physical Therapy Treatment Patient Details Name: Julia Mcfarland MRN: [id number] DOB: [birth year] Today's Date: 03/10/2017    History of Present Illness Pt is a 71 y.o. female now s/p elective L TKA on 03/07/2017. Pertinent PMH includes R TKA (2010), arthritis, heart murmur.     PT Comments    Pt is progressing towards goals. She continues to require assist with bed mobilities and transfers; however she states she will have family present to assist her at home. Pt requires increased time and effort to perform all mobilities. Slow and antalgic gait, however no assist required for ambulation. Pt expected to d/c this AM with HHPT to follow up.   Follow Up Recommendations  DC plan and follow up therapy as arranged by surgeon;Home health PT     Equipment Recommendations  Rolling walker with 5" wheels;3in1 (PT)    Recommendations for Other Services       Precautions / Restrictions Precautions Precautions: Knee Precaution Booklet Issued: No Restrictions Weight Bearing Restrictions: Yes LLE Weight Bearing: Weight bearing as tolerated    Mobility  Bed Mobility Overal bed mobility: Needs Assistance Bed Mobility: Supine to Sit     Supine to sit: Min assist;HOB elevated     General bed mobility comments: Min A to progress LE off EOB. Additional assist required to scoot hips forward. Pt requiring increased time with bed mobilties  Transfers Overall transfer level: Needs assistance Equipment used: Rolling walker (2 wheeled) Transfers: Sit to/from Stand Sit to Stand: Min assist;Min guard         General transfer comment: min A required to stand from bed with RW. Cueing for technique. No physical assist required for sit<>stand from recliner and BSC.   Ambulation/Gait Ambulation/Gait assistance: Min guard Ambulation Distance (Feet): 200 Feet Assistive device: Rolling walker (2 wheeled) Gait Pattern/deviations: Step-to pattern;Decreased stance time - left;Antalgic;Wide base of  support;Decreased step length - right;Decreased weight shift to left Gait velocity: Decreased Gait velocity interpretation: Below normal speed for age/gender General Gait Details: Very slow, antalgic gait with significant time and effort. Min guard for balance, but no physical assist required. Continues to require cues for technique with RW and upright posture   Stairs Stairs: Yes   Stair Management: No rails;Step to pattern;Backwards;Forwards;With walker Number of Stairs: 2 General stair comments: Pt required min A for walker management when negotiating stairs. Cueing and demonstration provided for technique.   Wheelchair Mobility    Modified Rankin (Stroke Patients Only)       Balance Overall balance assessment: Needs assistance Sitting-balance support: Feet supported;No upper extremity supported Sitting balance-Leahy Scale: Good     Standing balance support: Single extremity supported;Bilateral upper extremity supported;During functional activity Standing balance-Leahy Scale: Poor                              Cognition Arousal/Alertness: Awake/alert Behavior During Therapy: WFL for tasks assessed/performed Overall Cognitive Status: Within Functional Limits for tasks assessed                                        Exercises      General Comments        Pertinent Vitals/Pain Pain Assessment: Faces Faces Pain Scale: Hurts little more Pain Location: L knee Pain Descriptors / Indicators: Aching;Sore Pain Intervention(s): Monitored during session;Limited activity within patient's tolerance    Home Living  Prior Function            PT Goals (current goals can now be found in the care plan section) Acute Rehab PT Goals Patient Stated Goal: Return home with grandkids PT Goal Formulation: With patient Time For Goal Achievement: 03/21/17 Potential to Achieve Goals: Good Progress towards PT goals:  Progressing toward goals    Frequency    7X/week      PT Plan Current plan remains appropriate    Co-evaluation              AM-PAC PT "6 Clicks" Daily Activity  Outcome Measure  Difficulty turning over in bed (including adjusting bedclothes, sheets and blankets)?: A Little Difficulty moving from lying on back to sitting on the side of the bed? : Unable Difficulty sitting down on and standing up from a chair with arms (e.g., wheelchair, bedside commode, etc,.)?: Unable Help needed moving to and from a bed to chair (including a wheelchair)?: A Little Help needed walking in hospital room?: A Little Help needed climbing 3-5 steps with a railing? : A Little 6 Click Score: 14    End of Session Equipment Utilized During Treatment: Gait belt Activity Tolerance: Patient tolerated treatment well Patient left: with call bell/phone within reach;in chair Nurse Communication: Mobility status PT Visit Diagnosis: Other abnormalities of gait and mobility (R26.89);Pain Pain - Right/Left: Left Pain - part of body: Knee     Time: 8182-9937 PT Time Calculation (min) (ACUTE ONLY): 44 min  Charges:  $Gait Training: 38-52 mins                    G Codes:       Benjiman Core, [place] Pager [phone] Acute Rehab   Allena Katz 03/10/2017, 10:39 AM

## 2017-03-10 NOTE — Progress Notes (Signed)
Patient discharged home with family. DME at the house. [place] set up by CM. AVS and script reviewed and sent with patient. VSS. BP 122/65 (BP Location: Right Arm)   Pulse 80   Temp 98.7 F (37.1 C) (Oral)   Resp 19   SpO2 99%

## 2017-03-10 NOTE — Care Management Note (Signed)
  Case Management Note  Patient Details  Name: Julia Mcfarland MRN: [id number] Date of Birth: 04-11-1946  Subjective/Objective:   71 yr old female s/p left total knee arthroplasty.                 Action/Plan: Case manager spoke with patient concerning discharge plan and DME. Patient was preoperatively setup with [facility], no changes. She has RW and 3in1 at home. Patient says that her family will assist her at discharge.    Expected Discharge Date:  03/10/17               Expected Discharge Plan:  [place]  In-House Referral:  NA  Discharge planning Services  CM Consult  Post Acute Care Choice:  Home Health Choice offered to:  Patient  DME Arranged:   (Has RW and 3in1) DME Agency:  NA  HH Arranged:  PT [place] Agency:  [facility] (formerly [employer])  Status of Service:  Completed, signed off  If discussed at [employer], dates discussed:    Additional Comments:  Ninfa Meeker, RN 03/10/2017, 12:14 PM

## 2017-03-10 NOTE — Progress Notes (Signed)
Pt stable Doing well with PT Plan dc today after PT this am Will need hhpt

## 2017-03-11 DIAGNOSIS — Z6841 Body Mass Index (BMI) 40.0 and over, adult: Secondary | ICD-10-CM | POA: Diagnosis not present

## 2017-03-11 DIAGNOSIS — Z96653 Presence of artificial knee joint, bilateral: Secondary | ICD-10-CM | POA: Diagnosis not present

## 2017-03-11 DIAGNOSIS — M810 Age-related osteoporosis without current pathological fracture: Secondary | ICD-10-CM | POA: Diagnosis not present

## 2017-03-11 DIAGNOSIS — Z471 Aftercare following joint replacement surgery: Secondary | ICD-10-CM | POA: Diagnosis not present

## 2017-03-13 ENCOUNTER — Telehealth (INDEPENDENT_AMBULATORY_CARE_PROVIDER_SITE_OTHER): Payer: Self-pay | Admitting: Orthopedic Surgery

## 2017-03-13 DIAGNOSIS — Z6841 Body Mass Index (BMI) 40.0 and over, adult: Secondary | ICD-10-CM | POA: Diagnosis not present

## 2017-03-13 DIAGNOSIS — M810 Age-related osteoporosis without current pathological fracture: Secondary | ICD-10-CM | POA: Diagnosis not present

## 2017-03-13 DIAGNOSIS — Z96653 Presence of artificial knee joint, bilateral: Secondary | ICD-10-CM | POA: Diagnosis not present

## 2017-03-13 DIAGNOSIS — Z471 Aftercare following joint replacement surgery: Secondary | ICD-10-CM | POA: Diagnosis not present

## 2017-03-13 NOTE — Telephone Encounter (Signed)
Julia Mcfarland from [place] called asking for verbal approval of 1 time a week for 1 week and 3 times a week for 2 weeks. [phone]

## 2017-03-13 NOTE — Telephone Encounter (Signed)
ic verbal given

## 2017-03-15 DIAGNOSIS — M810 Age-related osteoporosis without current pathological fracture: Secondary | ICD-10-CM | POA: Diagnosis not present

## 2017-03-15 DIAGNOSIS — Z6841 Body Mass Index (BMI) 40.0 and over, adult: Secondary | ICD-10-CM | POA: Diagnosis not present

## 2017-03-15 DIAGNOSIS — Z96653 Presence of artificial knee joint, bilateral: Secondary | ICD-10-CM | POA: Diagnosis not present

## 2017-03-15 DIAGNOSIS — Z471 Aftercare following joint replacement surgery: Secondary | ICD-10-CM | POA: Diagnosis not present

## 2017-03-17 DIAGNOSIS — Z6841 Body Mass Index (BMI) 40.0 and over, adult: Secondary | ICD-10-CM | POA: Diagnosis not present

## 2017-03-17 DIAGNOSIS — Z471 Aftercare following joint replacement surgery: Secondary | ICD-10-CM | POA: Diagnosis not present

## 2017-03-17 DIAGNOSIS — M810 Age-related osteoporosis without current pathological fracture: Secondary | ICD-10-CM | POA: Diagnosis not present

## 2017-03-17 DIAGNOSIS — Z96653 Presence of artificial knee joint, bilateral: Secondary | ICD-10-CM | POA: Diagnosis not present

## 2017-03-20 ENCOUNTER — Ambulatory Visit (INDEPENDENT_AMBULATORY_CARE_PROVIDER_SITE_OTHER): Payer: Medicare HMO | Admitting: Orthopedic Surgery

## 2017-03-20 ENCOUNTER — Encounter (INDEPENDENT_AMBULATORY_CARE_PROVIDER_SITE_OTHER): Payer: Self-pay | Admitting: Orthopedic Surgery

## 2017-03-20 ENCOUNTER — Telehealth (INDEPENDENT_AMBULATORY_CARE_PROVIDER_SITE_OTHER): Payer: Self-pay

## 2017-03-20 ENCOUNTER — Ambulatory Visit (INDEPENDENT_AMBULATORY_CARE_PROVIDER_SITE_OTHER): Payer: Medicare HMO

## 2017-03-20 DIAGNOSIS — Z471 Aftercare following joint replacement surgery: Secondary | ICD-10-CM | POA: Diagnosis not present

## 2017-03-20 DIAGNOSIS — Z6841 Body Mass Index (BMI) 40.0 and over, adult: Secondary | ICD-10-CM | POA: Diagnosis not present

## 2017-03-20 DIAGNOSIS — Z96652 Presence of left artificial knee joint: Secondary | ICD-10-CM

## 2017-03-20 DIAGNOSIS — M810 Age-related osteoporosis without current pathological fracture: Secondary | ICD-10-CM | POA: Diagnosis not present

## 2017-03-20 DIAGNOSIS — Z96653 Presence of artificial knee joint, bilateral: Secondary | ICD-10-CM | POA: Diagnosis not present

## 2017-03-20 MED ORDER — OXYCODONE HCL 5 MG PO TABS: ORAL_TABLET | ORAL | 0 refills | Status: DC

## 2017-03-20 NOTE — Telephone Encounter (Signed)
[facility] called this morning to let us know patient brought rx for oxycodone to them this weekend. Pharmacist wanted to let us know that they only were able to fill 1/2 of the rx due to the way it was written. It was written for 1-2 po q 3 hrs #50 and based on the narcotic laws they have to follow it was over amount for the morphine equivalent rate guidelines that they now have to follow without having a dx. She wanted [place] to be aware since patient has appt this afternoon and in case she asked for pain medication refill she only received #25 tablets.

## 2017-03-20 NOTE — Anesthesia Postprocedure Evaluation (Signed)
Anesthesia Post Note  [name]  Procedure(s) Performed: Procedure(s) (LRB): LEFT TOTAL KNEE ARTHROPLASTY (Left)     Patient location during evaluation: PACU Anesthesia Type: Regional Level of consciousness: awake, awake and alert and oriented Pain management: pain level controlled Vital Signs Assessment: post-procedure vital signs reviewed and stable Respiratory status: spontaneous breathing, nonlabored ventilation and respiratory function stable Cardiovascular status: blood pressure returned to baseline Postop Assessment: spinal receding Anesthetic complications: no    Last Vitals:  Vitals:   03/10/17 0540 03/10/17 1200  BP: 126/66 122/65  Pulse: 79 80  Resp: 16 19  Temp: 36.9 C 37.1 C  SpO2: 100% 99%    Last Pain:  Vitals:   03/10/17 1200  TempSrc: Oral  PainSc:                  Landrey Mahurin COKER

## 2017-03-22 DIAGNOSIS — Z96653 Presence of artificial knee joint, bilateral: Secondary | ICD-10-CM | POA: Diagnosis not present

## 2017-03-22 DIAGNOSIS — M810 Age-related osteoporosis without current pathological fracture: Secondary | ICD-10-CM | POA: Diagnosis not present

## 2017-03-22 DIAGNOSIS — Z471 Aftercare following joint replacement surgery: Secondary | ICD-10-CM | POA: Diagnosis not present

## 2017-03-22 DIAGNOSIS — Z6841 Body Mass Index (BMI) 40.0 and over, adult: Secondary | ICD-10-CM | POA: Diagnosis not present

## 2017-03-22 NOTE — Discharge Summary (Signed)
Physician Discharge Summary  Patient ID: Julia Mcfarland MRN: [id number] DOB/AGE: 1945-12-07 71 y.o.  Admit date: 03/07/2017 Discharge date: 03/10/2017  Admission Diagnoses:  Active Problems:   Arthritis of knee   Discharge Diagnoses:  Same  Surgeries: Procedure(s): LEFT TOTAL KNEE ARTHROPLASTY on 03/07/2017   Consultants:   Discharged Condition: Stable  Hospital Course: Julia Mcfarland is an 71 y.o. female who was admitted 03/07/2017 with a chief complaint of left knee pain, and found to have a diagnosis of left knee arthritis.  They were brought to the operating room on 03/07/2017 and underwent the above named procedures.  She did well.  Physical therapy began mobilization on postop day #1.  CPM machine was utilized.  Patient was discharged to home in good condition on postop day #3.  She will take Xarelto for DVT prophylaxis and continue with home health physical therapy and the CPM machine.  Follow up with me 10 days after discharge  Antibiotics given:  Anti-infectives    Start     Dose/Rate Route Frequency Ordered Stop   03/07/17 1400  ceFAZolin (ANCEF) IVPB 2g/100 mL premix     2 g 200 mL/hr over 30 Minutes Intravenous Every 6 hours 03/07/17 1313 03/07/17 2311   03/07/17 0557  ceFAZolin (ANCEF) 2-4 GM/100ML-% IVPB    Comments:  Leandrew Koyanagi   : cabinet override      03/07/17 0557 03/07/17 1759   03/07/17 0555  ceFAZolin (ANCEF) 3 g in dextrose 5 % 50 mL IVPB     3 g 130 mL/hr over 30 Minutes Intravenous On call to O.R. 03/07/17 9629 03/07/17 0750    .  Recent vital signs:  Vitals:   03/10/17 0540 03/10/17 1200  BP: 126/66 122/65  Pulse: 79 80  Resp: 16 19  Temp: 98.5 F (36.9 C) 98.7 F (37.1 C)  SpO2: 100% 99%    Recent laboratory studies:  Results for orders placed or performed during the hospital encounter of 02/28/17  Urine culture  Result Value Ref Range   Specimen Description URINE, CLEAN CATCH    Special Requests NONE    Culture <10,000 COLONIES/mL  INSIGNIFICANT GROWTH (A)    Report Status 03/01/2017 FINAL   Surgical pcr screen  Result Value Ref Range   MRSA, PCR NEGATIVE NEGATIVE   Staphylococcus aureus NEGATIVE NEGATIVE  Basic metabolic panel  Result Value Ref Range   Sodium 139 135 - 145 mmol/L   Potassium 3.9 3.5 - 5.1 mmol/L   Chloride 107 101 - 111 mmol/L   CO2 24 22 - 32 mmol/L   Glucose, Bld 107 (H) 65 - 99 mg/dL   BUN 12 6 - 20 mg/dL   Creatinine, Ser 0.85 0.44 - 1.00 mg/dL   Calcium 9.1 8.9 - 10.3 mg/dL   GFR calc non Af Amer >60 >60 mL/min   GFR calc Af Amer >60 >60 mL/min   Anion gap 8 5 - 15  CBC  Result Value Ref Range   WBC 5.1 4.0 - 10.5 K/uL   RBC 4.27 3.87 - 5.11 MIL/uL   Hemoglobin 13.0 12.0 - 15.0 g/dL   HCT 40.1 36.0 - 46.0 %   MCV 93.9 78.0 - 100.0 fL   MCH 30.4 26.0 - 34.0 pg   MCHC 32.4 30.0 - 36.0 g/dL   RDW 13.2 11.5 - 15.5 %   Platelets 239 150 - 400 K/uL  Urinalysis, Routine w reflex microscopic  Result Value Ref Range   Color, Urine YELLOW YELLOW  APPearance CLEAR CLEAR   Specific Gravity, Urine 1.010 1.005 - 1.030   pH 6.0 5.0 - 8.0   Glucose, UA NEGATIVE NEGATIVE mg/dL   Hgb urine dipstick NEGATIVE NEGATIVE   Bilirubin Urine NEGATIVE NEGATIVE   Ketones, ur NEGATIVE NEGATIVE mg/dL   Protein, ur NEGATIVE NEGATIVE mg/dL   Nitrite NEGATIVE NEGATIVE   Leukocytes, UA NEGATIVE NEGATIVE    Discharge Medications:   Allergies as of 03/10/2017   No Known Allergies     Medication List    TAKE these medications   calcium carbonate 600 MG Tabs tablet Commonly known as:  OS-CAL Take 600 mg by mouth 3 (three) times daily with meals.   docusate sodium 100 MG capsule Commonly known as:  COLACE Take 1 capsule (100 mg total) by mouth 2 (two) times daily.   furosemide 20 MG tablet Commonly known as:  LASIX Take 1 tablet (20 mg total) by mouth daily.   methocarbamol 500 MG tablet Commonly known as:  ROBAXIN Take 1 tablet (500 mg total) by mouth every 8 (eight) hours as needed for muscle  spasms.   rivaroxaban 10 MG Tabs tablet Commonly known as:  XARELTO Take 1 tablet (10 mg total) by mouth daily with breakfast.   vitamin C 500 MG tablet Commonly known as:  ASCORBIC ACID Take 500 mg by mouth daily.   Vitamin D3 5000 units Caps Take 5,000 Units by mouth daily.            Discharge Care Instructions        Start     Ordered   03/10/17 0000  docusate sodium (COLACE) 100 MG capsule  2 times daily     03/10/17 0821   03/10/17 0000  methocarbamol (ROBAXIN) 500 MG tablet  Every 8 hours PRN     03/10/17 0821   03/10/17 0000  rivaroxaban (XARELTO) 10 MG TABS tablet  Daily with breakfast     03/10/17 0821   03/10/17 0000  Call MD / Call 911    Comments:  If you experience chest pain or shortness of breath, CALL 911 and be transported to the hospital emergency room.  If you develope a fever above 101 F, pus (white drainage) or increased drainage or redness at the wound, or calf pain, call your surgeon's office.   03/10/17 0821   03/10/17 0000  Diet - low sodium heart healthy     03/10/17 0821   03/10/17 0000  Constipation Prevention    Comments:  Drink plenty of fluids.  Prune juice may be helpful.  You may use a stool softener, such as Colace (over the counter) 100 mg twice a day.  Use MiraLax (over the counter) for constipation as needed.   03/10/17 0821   03/10/17 0000  Increase activity slowly as tolerated     03/10/17 0821   03/10/17 0000  Discharge instructions    Comments:  Ok to shower CPM 6 hours per day minimum Remove knee dressing Thursday of this week up coming   03/10/17 1610      Diagnostic Studies: Dg Chest 2 View  Result Date: 02/28/2017 CLINICAL DATA:  Preop left total knee replacement EXAM: CHEST  2 VIEW COMPARISON:  None. FINDINGS: Heart and mediastinal contours are within normal limits. No focal opacities or effusions. No acute bony abnormality. Aortic calcifications with mild tortuosity. IMPRESSION: No active cardiopulmonary disease.  Electronically Signed   By: Rolm Baptise M.D.   On: 02/28/2017 15:11    Disposition: 01-Home or  Self Care  Discharge Instructions    Call MD / Call 911    Complete by:  As directed    If you experience chest pain or shortness of breath, CALL 911 and be transported to the hospital emergency room.  If you develope a fever above 101 F, pus (white drainage) or increased drainage or redness at the wound, or calf pain, call your surgeon's office.   Constipation Prevention    Complete by:  As directed    Drink plenty of fluids.  Prune juice may be helpful.  You may use a stool softener, such as Colace (over the counter) 100 mg twice a day.  Use MiraLax (over the counter) for constipation as needed.   Diet - low sodium heart healthy    Complete by:  As directed    Discharge instructions    Complete by:  As directed    Ok to shower CPM 6 hours per day minimum Remove knee dressing Thursday of this week up coming   Increase activity slowly as tolerated    Complete by:  As directed       Follow-up Information    [facility] up.   Specialty:  [place] Why:  A representative from [facility] will contact you to arrange start date and time for your therapy. Contact information: [address] [place] [address] [phone]            Signed: Anderson Malta 03/22/2017, 8:12 AM

## 2017-03-23 NOTE — Progress Notes (Signed)
   Post-Op Visit Note   [name]           Date of Birth: [birth year]           MRN: [id number] Visit Date: 03/20/2017 PCP: Julia Fenton, NP   Assessment & Plan:  Chief Complaint:  Chief Complaint  Patient presents with  . Left Knee - Routine Post Op   Visit Diagnoses:  1. S/P TKR (total knee replacement), left     Plan: Julia Mcfarland is a 71 year old patient underwent left total knee replacement 03/07/2017.  She didn't have health physical therapy 2-3 times a week doing CPM machine daily taking oxycodone which is refilled.  Continue with CPM machine which is currently at 100.  Follow-up in 4 weeks for clinical recheck.  Radiographs look good today.  Follow-Up Instructions: Return in about 4 weeks (around 04/17/2017).   Orders:  Orders Placed This Encounter  Procedures  . XR Knee 1-2 Views Left   Meds ordered this encounter  Medications  . oxyCODONE (OXY IR/ROXICODONE) 5 MG immediate release tablet    Sig: 1-2 po q 4-6 hrs prn pain    Dispense:  40 tablet    Refill:  0    Imaging: No results found.  PMFS History: Patient Active Problem List   Diagnosis Date Noted  . Arthritis of knee 03/07/2017  . Environmental allergies 08/21/2014  . Arthritis 08/21/2014  . Prediabetes 08/21/2014  . Osteoporosis 08/14/2013  . Morbid obesity with BMI of 50.0-59.9, adult ([facility]) 08/14/2013   Past Medical History:  Diagnosis Date  . Allergy   . Arthritis   . Chicken pox   . Edema   . Heart murmur    YEARS  AGO  NO PROBLEM   . PONV (postoperative nausea and vomiting)     Family History  Problem Relation Age of Onset  . Arthritis Mother   . Heart disease Mother   . Hypertension Mother   . Diabetes Mother   . Stroke Father   . Hypertension Father   . Stroke Sister   . Hypertension Sister   . Heart disease Maternal Grandfather   . Diabetes Paternal Grandmother   . Heart disease Paternal Grandfather     Past Surgical History:  Procedure Laterality Date  .  ABDOMINAL HYSTERECTOMY  1988  . CARPAL TUNNEL RELEASE Right   . REPLACEMENT TOTAL KNEE Right 2010  . TOTAL KNEE ARTHROPLASTY Left 03/07/2017   Procedure: LEFT TOTAL KNEE ARTHROPLASTY;  Surgeon: Meredith Pel, MD;  Location: [facility];  Service: Orthopedics;  Laterality: Left;   Social History   Occupational History  . Not on file.   Social History Main Topics  . Smoking status: Never Smoker  . Smokeless tobacco: Never Used  . Alcohol use Yes     Comment: occasional  . Drug use: No  . Sexual activity: No

## 2017-03-24 DIAGNOSIS — M810 Age-related osteoporosis without current pathological fracture: Secondary | ICD-10-CM | POA: Diagnosis not present

## 2017-03-24 DIAGNOSIS — Z471 Aftercare following joint replacement surgery: Secondary | ICD-10-CM | POA: Diagnosis not present

## 2017-03-24 DIAGNOSIS — Z6841 Body Mass Index (BMI) 40.0 and over, adult: Secondary | ICD-10-CM | POA: Diagnosis not present

## 2017-03-24 DIAGNOSIS — Z96653 Presence of artificial knee joint, bilateral: Secondary | ICD-10-CM | POA: Diagnosis not present

## 2017-03-27 DIAGNOSIS — M1712 Unilateral primary osteoarthritis, left knee: Secondary | ICD-10-CM | POA: Diagnosis not present

## 2017-03-29 ENCOUNTER — Other Ambulatory Visit (INDEPENDENT_AMBULATORY_CARE_PROVIDER_SITE_OTHER): Payer: Self-pay | Admitting: Orthopedic Surgery

## 2017-03-29 DIAGNOSIS — M1712 Unilateral primary osteoarthritis, left knee: Secondary | ICD-10-CM | POA: Diagnosis not present

## 2017-03-29 NOTE — Telephone Encounter (Signed)
Ok to [employer]  she has had for 3 weeks

## 2017-03-29 NOTE — Telephone Encounter (Signed)
Should patient still be taking this? If so, ok to rf?

## 2017-04-03 DIAGNOSIS — M1712 Unilateral primary osteoarthritis, left knee: Secondary | ICD-10-CM | POA: Diagnosis not present

## 2017-04-05 DIAGNOSIS — M1712 Unilateral primary osteoarthritis, left knee: Secondary | ICD-10-CM | POA: Diagnosis not present

## 2017-04-10 DIAGNOSIS — M1712 Unilateral primary osteoarthritis, left knee: Secondary | ICD-10-CM | POA: Diagnosis not present

## 2017-04-12 DIAGNOSIS — M1712 Unilateral primary osteoarthritis, left knee: Secondary | ICD-10-CM | POA: Diagnosis not present

## 2017-04-17 DIAGNOSIS — M1712 Unilateral primary osteoarthritis, left knee: Secondary | ICD-10-CM | POA: Diagnosis not present

## 2017-04-19 ENCOUNTER — Ambulatory Visit (INDEPENDENT_AMBULATORY_CARE_PROVIDER_SITE_OTHER): Payer: Medicare HMO | Admitting: Orthopedic Surgery

## 2017-04-19 DIAGNOSIS — M1712 Unilateral primary osteoarthritis, left knee: Secondary | ICD-10-CM | POA: Diagnosis not present

## 2017-04-20 ENCOUNTER — Ambulatory Visit (INDEPENDENT_AMBULATORY_CARE_PROVIDER_SITE_OTHER): Payer: Medicare HMO | Admitting: Orthopedic Surgery

## 2017-04-20 ENCOUNTER — Encounter (INDEPENDENT_AMBULATORY_CARE_PROVIDER_SITE_OTHER): Payer: Self-pay | Admitting: Orthopedic Surgery

## 2017-04-20 DIAGNOSIS — Z96652 Presence of left artificial knee joint: Secondary | ICD-10-CM

## 2017-04-22 NOTE — Progress Notes (Signed)
   Post-Op Visit Note   [name]           Date of Birth: [birth year]           MRN: [id number] Visit Date: 04/20/2017 PCP: [name], NP   Assessment & Plan:  Chief Complaint:  Chief Complaint  Patient presents with  . Left Knee - Follow-up   Visit Diagnoses:  1. S/P TKR (total knee replacement), left     Plan: and he is a 71 year old patient with left knee replacemen  She is 6 weeks out.  She is doing well.  Flexion postop current approximates preoperative flexion.  Continue with physical therapy to work on achieving more flexion.  6 week return  Follow-Up Instructions: Return in about 6 weeks (around 06/01/2017).   Orders:  No orders of the defined types were placed in this encounter.  No orders of the defined types were placed in this encounter.   Imaging: No results found.  PMFS History: Patient Active Problem List   Diagnosis Date Noted  . Arthritis of knee 03/07/2017  . Environmental allergies 08/21/2014  . Arthritis 08/21/2014  . Prediabetes 08/21/2014  . Osteoporosis 08/14/2013  . Morbid obesity with BMI of 50.0-59.9, adult ([facility]) 08/14/2013   Past Medical History:  Diagnosis Date  . Allergy   . Arthritis   . Chicken pox   . Edema   . Heart murmur    YEARS  AGO  NO PROBLEM   . PONV (postoperative nausea and vomiting)     Family History  Problem Relation Age of Onset  . Arthritis Mother   . Heart disease Mother   . Hypertension Mother   . Diabetes Mother   . Stroke Father   . Hypertension Father   . Stroke Sister   . Hypertension Sister   . Heart disease Maternal Grandfather   . Diabetes Paternal Grandmother   . Heart disease Paternal Grandfather     Past Surgical History:  Procedure Laterality Date  . ABDOMINAL HYSTERECTOMY  1988  . CARPAL TUNNEL RELEASE Right   . REPLACEMENT TOTAL KNEE Right 2010  . TOTAL KNEE ARTHROPLASTY Left 03/07/2017   Procedure: LEFT TOTAL KNEE ARTHROPLASTY;  Surgeon: Meredith Pel, MD;   Location: [facility];  Service: Orthopedics;  Laterality: Left;   Social History   Occupational History  . Not on file.   Social History Main Topics  . Smoking status: Never Smoker  . Smokeless tobacco: Never Used  . Alcohol use Yes     Comment: occasional  . Drug use: No  . Sexual activity: No

## 2017-04-24 DIAGNOSIS — M1712 Unilateral primary osteoarthritis, left knee: Secondary | ICD-10-CM | POA: Diagnosis not present

## 2017-04-26 DIAGNOSIS — M1712 Unilateral primary osteoarthritis, left knee: Secondary | ICD-10-CM | POA: Diagnosis not present

## 2017-05-01 DIAGNOSIS — M1712 Unilateral primary osteoarthritis, left knee: Secondary | ICD-10-CM | POA: Diagnosis not present

## 2017-05-03 DIAGNOSIS — M1712 Unilateral primary osteoarthritis, left knee: Secondary | ICD-10-CM | POA: Diagnosis not present

## 2017-05-08 DIAGNOSIS — M1712 Unilateral primary osteoarthritis, left knee: Secondary | ICD-10-CM | POA: Diagnosis not present

## 2017-05-10 DIAGNOSIS — M1712 Unilateral primary osteoarthritis, left knee: Secondary | ICD-10-CM | POA: Diagnosis not present

## 2017-05-15 DIAGNOSIS — M1712 Unilateral primary osteoarthritis, left knee: Secondary | ICD-10-CM | POA: Diagnosis not present

## 2017-05-19 DIAGNOSIS — M1712 Unilateral primary osteoarthritis, left knee: Secondary | ICD-10-CM | POA: Diagnosis not present

## 2017-05-22 DIAGNOSIS — M1712 Unilateral primary osteoarthritis, left knee: Secondary | ICD-10-CM | POA: Diagnosis not present

## 2017-05-24 DIAGNOSIS — M1712 Unilateral primary osteoarthritis, left knee: Secondary | ICD-10-CM | POA: Diagnosis not present

## 2017-05-29 DIAGNOSIS — M1712 Unilateral primary osteoarthritis, left knee: Secondary | ICD-10-CM | POA: Diagnosis not present

## 2017-05-31 DIAGNOSIS — M1712 Unilateral primary osteoarthritis, left knee: Secondary | ICD-10-CM | POA: Diagnosis not present

## 2017-06-02 ENCOUNTER — Ambulatory Visit (INDEPENDENT_AMBULATORY_CARE_PROVIDER_SITE_OTHER): Payer: Medicare HMO | Admitting: Orthopedic Surgery

## 2017-06-02 ENCOUNTER — Encounter (INDEPENDENT_AMBULATORY_CARE_PROVIDER_SITE_OTHER): Payer: Self-pay | Admitting: Orthopedic Surgery

## 2017-06-02 DIAGNOSIS — Z96652 Presence of left artificial knee joint: Secondary | ICD-10-CM

## 2017-06-03 ENCOUNTER — Encounter (INDEPENDENT_AMBULATORY_CARE_PROVIDER_SITE_OTHER): Payer: Self-pay | Admitting: Orthopedic Surgery

## 2017-06-03 NOTE — Progress Notes (Signed)
   Post-Op Visit Note   [name]           Date of Birth: [birth year]           MRN: [id number] Visit Date: 06/02/2017 PCP: [name], NP   Assessment & Plan:  Chief Complaint:  Chief Complaint  Patient presents with  . Left Knee - Follow-up   Visit Diagnoses:  1. S/P TKR (total knee replacement), left     Plan: Gennavieve is a patient who is now 10 weeks out left total knee replacement.  States she is doing well.  She is in physical therapy and is nearing her discharge date on that.  She is ambulating with a cane.  On exam she has flexion almost to 90.  Extension is full.  Range of motion is painless.  There is no knee effusion.  Plan at this time is for discharge with return as needed.  Need for prophylactic antibiotics before procedures discussed.  We will see her back  Follow-Up Instructions: Return if symptoms worsen or fail to improve.   Orders:  No orders of the defined types were placed in this encounter.  No orders of the defined types were placed in this encounter.   Imaging: No results found.  PMFS History: Patient Active Problem List   Diagnosis Date Noted  . Arthritis of knee 03/07/2017  . Environmental allergies 08/21/2014  . Arthritis 08/21/2014  . Prediabetes 08/21/2014  . Osteoporosis 08/14/2013  . Morbid obesity with BMI of 50.0-59.9, adult ([facility]) 08/14/2013   Past Medical History:  Diagnosis Date  . Allergy   . Arthritis   . Chicken pox   . Edema   . Heart murmur    YEARS  AGO  NO PROBLEM   . PONV (postoperative nausea and vomiting)     Family History  Problem Relation Age of Onset  . Arthritis Mother   . Heart disease Mother   . Hypertension Mother   . Diabetes Mother   . Stroke Father   . Hypertension Father   . Stroke Sister   . Hypertension Sister   . Heart disease Maternal Grandfather   . Diabetes Paternal Grandmother   . Heart disease Paternal Grandfather     Past Surgical History:  Procedure Laterality Date   . ABDOMINAL HYSTERECTOMY  1988  . CARPAL TUNNEL RELEASE Right   . REPLACEMENT TOTAL KNEE Right 2010  . TOTAL KNEE ARTHROPLASTY Left 03/07/2017   Procedure: LEFT TOTAL KNEE ARTHROPLASTY;  Surgeon: Meredith Pel, MD;  Location: [facility];  Service: Orthopedics;  Laterality: Left;   Social History   Occupational History  . Not on file  Tobacco Use  . Smoking status: Never Smoker  . Smokeless tobacco: Never Used  Substance and Sexual Activity  . Alcohol use: Yes    Comment: occasional  . Drug use: No  . Sexual activity: No

## 2017-06-07 DIAGNOSIS — M1712 Unilateral primary osteoarthritis, left knee: Secondary | ICD-10-CM | POA: Diagnosis not present

## 2017-06-09 DIAGNOSIS — Z1231 Encounter for screening mammogram for malignant neoplasm of breast: Secondary | ICD-10-CM | POA: Diagnosis not present

## 2017-06-09 DIAGNOSIS — Z9289 Personal history of other medical treatment: Secondary | ICD-10-CM | POA: Diagnosis not present

## 2017-06-09 LAB — HM MAMMOGRAPHY

## 2017-06-19 DIAGNOSIS — M1712 Unilateral primary osteoarthritis, left knee: Secondary | ICD-10-CM | POA: Diagnosis not present

## 2017-06-22 DIAGNOSIS — M1712 Unilateral primary osteoarthritis, left knee: Secondary | ICD-10-CM | POA: Diagnosis not present

## 2017-06-29 DIAGNOSIS — M1712 Unilateral primary osteoarthritis, left knee: Secondary | ICD-10-CM | POA: Diagnosis not present

## 2017-07-20 ENCOUNTER — Encounter: Payer: Self-pay | Admitting: Internal Medicine

## 2018-02-01 ENCOUNTER — Encounter: Payer: Self-pay | Admitting: Internal Medicine

## 2018-02-01 DIAGNOSIS — H2513 Age-related nuclear cataract, bilateral: Secondary | ICD-10-CM | POA: Diagnosis not present

## 2018-02-01 DIAGNOSIS — H25011 Cortical age-related cataract, right eye: Secondary | ICD-10-CM | POA: Diagnosis not present

## 2018-02-01 DIAGNOSIS — H40013 Open angle with borderline findings, low risk, bilateral: Secondary | ICD-10-CM | POA: Diagnosis not present

## 2018-02-01 DIAGNOSIS — H35363 Drusen (degenerative) of macula, bilateral: Secondary | ICD-10-CM | POA: Diagnosis not present

## 2018-02-06 ENCOUNTER — Encounter: Payer: Medicare HMO | Admitting: Internal Medicine

## 2018-02-06 ENCOUNTER — Ambulatory Visit (INDEPENDENT_AMBULATORY_CARE_PROVIDER_SITE_OTHER): Payer: Medicare HMO | Admitting: Internal Medicine

## 2018-02-06 ENCOUNTER — Encounter: Payer: Self-pay | Admitting: Internal Medicine

## 2018-02-06 VITALS — BP 124/80 | HR 65 | Temp 98.1°F | Ht 61.0 in | Wt 270.0 lb

## 2018-02-06 DIAGNOSIS — D839 Common variable immunodeficiency, unspecified: Secondary | ICD-10-CM | POA: Diagnosis not present

## 2018-02-06 DIAGNOSIS — Z136 Encounter for screening for cardiovascular disorders: Secondary | ICD-10-CM | POA: Diagnosis not present

## 2018-02-06 DIAGNOSIS — Z6841 Body Mass Index (BMI) 40.0 and over, adult: Secondary | ICD-10-CM | POA: Diagnosis not present

## 2018-02-06 DIAGNOSIS — Z0000 Encounter for general adult medical examination without abnormal findings: Secondary | ICD-10-CM

## 2018-02-06 DIAGNOSIS — M810 Age-related osteoporosis without current pathological fracture: Secondary | ICD-10-CM

## 2018-02-06 LAB — CBC
HCT: 36.5 % (ref 36.0–46.0)
Hemoglobin: 12.3 g/dL (ref 12.0–15.0)
MCHC: 33.8 g/dL (ref 30.0–36.0)
MCV: 92.3 fl (ref 78.0–100.0)
Platelets: 239 10*3/uL (ref 150.0–400.0)
RBC: 3.96 Mil/uL (ref 3.87–5.11)
RDW: 13.5 % (ref 11.5–15.5)
WBC: 5.2 10*3/uL (ref 4.0–10.5)

## 2018-02-06 LAB — LIPID PANEL
CHOLESTEROL: 171 mg/dL (ref 0–200)
HDL: 66.8 mg/dL (ref >39.00)
LDL Cholesterol: 92 mg/dL (ref 0–99)
NonHDL: 104.69
Total CHOL/HDL Ratio: 3
Triglycerides: 61 mg/dL (ref 0.0–149.0)
VLDL: 12.2 mg/dL (ref 0.0–40.0)

## 2018-02-06 LAB — COMPREHENSIVE METABOLIC PANEL
ALBUMIN: 3.9 g/dL (ref 3.5–5.2)
ALK PHOS: 64 U/L (ref 39–117)
ALT: 12 U/L (ref 0–35)
AST: 14 U/L (ref 0–37)
BILIRUBIN TOTAL: 0.6 mg/dL (ref 0.2–1.2)
BUN: 16 mg/dL (ref 6–23)
CO2: 28 mEq/L (ref 19–32)
Calcium: 9.1 mg/dL (ref 8.4–10.5)
Chloride: 106 mEq/L (ref 96–112)
Creatinine, Ser: 0.89 mg/dL (ref 0.40–1.20)
GFR: 80.42 mL/min (ref >60.00)
Glucose, Bld: 127 mg/dL — ABNORMAL HIGH (ref 70–99)
POTASSIUM: 3.8 meq/L (ref 3.5–5.1)
SODIUM: 142 meq/L (ref 135–145)
TOTAL PROTEIN: 6.7 g/dL (ref 6.0–8.3)

## 2018-02-06 LAB — VITAMIN D 25 HYDROXY (VIT D DEFICIENCY, FRACTURES): VITD: 40.62 ng/mL (ref 30.00–100.00)

## 2018-02-06 MED ORDER — FUROSEMIDE 20 MG PO TABS: 20.0000 mg | ORAL_TABLET | Freq: Every day | ORAL | 3 refills | Status: DC

## 2018-02-06 NOTE — Progress Notes (Signed)
HPI:  Pt presents to the clinic today for her Medicare Wellness  Exam.   CVI: Controlled on Lasix. She tries to elevate her legs. She needs a refill of Lasix today.  Osteoporosis: Her last bone density was in 2016. She reports she has never taken treatment for this. She is taking Calcium and Vit D OTC.   Past Medical History:  Diagnosis Date  . Allergy   . Arthritis   . Chicken pox   . Edema   . Heart murmur    YEARS  AGO  NO PROBLEM   . PONV (postoperative nausea and vomiting)     Current Outpatient Medications  Medication Sig Dispense Refill  . calcium carbonate (OS-CAL) 600 MG TABS tablet Take 600 mg by mouth 3 (three) times daily with meals.    . Cholecalciferol (VITAMIN D3) 5000 UNITS CAPS Take 5,000 Units by mouth daily.     Marland Kitchen docusate sodium (COLACE) 100 MG capsule Take 1 capsule (100 mg total) by mouth 2 (two) times daily. 10 capsule 0  . furosemide (LASIX) 20 MG tablet Take 1 tablet (20 mg total) by mouth daily. 90 tablet 3  . methocarbamol (ROBAXIN) 500 MG tablet Take 1 tablet (500 mg total) by mouth every 8 (eight) hours as needed for muscle spasms. 30 tablet 0  . oxyCODONE (OXY IR/ROXICODONE) 5 MG immediate release tablet 1-2 po q 4-6 hrs prn pain 40 tablet 0  . rivaroxaban (XARELTO) 10 MG TABS tablet Take 1 tablet (10 mg total) by mouth daily with breakfast. 21 tablet 0  . vitamin C (ASCORBIC ACID) 500 MG tablet Take 500 mg by mouth daily.     No current facility-administered medications for this visit.     No Known Allergies  Family History  Problem Relation Age of Onset  . Arthritis Mother   . Heart disease Mother   . Hypertension Mother   . Diabetes Mother   . Stroke Father   . Hypertension Father   . Stroke Sister   . Hypertension Sister   . Heart disease Maternal Grandfather   . Diabetes Paternal Grandmother   . Heart disease Paternal Grandfather     Social History   Socioeconomic History  . Marital status: Married    Spouse name: Not on file   . Number of children: Not on file  . Years of education: Not on file  . Highest education level: Not on file  Occupational History  . Not on file  Social Needs  . Financial resource strain: Not on file  . Food insecurity:    Worry: Not on file    Inability: Not on file  . Transportation needs:    Medical: Not on file    Non-medical: Not on file  Tobacco Use  . Smoking status: Never Smoker  . Smokeless tobacco: Never Used  Substance and Sexual Activity  . Alcohol use: Yes    Comment: occasional  . Drug use: No  . Sexual activity: Never  Lifestyle  . Physical activity:    Days per week: Not on file    Minutes per session: Not on file  . Stress: Not on file  Relationships  . Social connections:    Talks on phone: Not on file    Gets together: Not on file    Attends religious service: Not on file    Active member of club or organization: Not on file    Attends meetings of clubs or organizations: Not on file  Relationship status: Not on file  . Intimate partner violence:    Fear of current or ex partner: Not on file    Emotionally abused: Not on file    Physically abused: Not on file    Forced sexual activity: Not on file  Other Topics Concern  . Not on file  Social History Narrative  . Not on file    Hospitiliaztions: None  Health Maintenance:    Flu: never  Tetanus:  08/2013  Pneumovax: 12/2015  Prevnar: 08/2014  Zostavax: never  Shingrix: never  Mammogram: 06/2017  Pap Smear: Hysterectomy  Bone Density: 09/2014  Colon Screening: 10/2014  Eye Doctor: annually, Dr. Herbert Deaner  Dental Exam: biannually   Providers:   PCP: Dagoberto Reef  Orthopedist: Dr. Marlou Sa    I have personally reviewed and have noted:  1. The patient's medical and social history 2. Their use of alcohol, tobacco or illicit drugs 3. Their current medications and supplements 4. The patient's functional ability including ADL's, fall risks, home safety risks and hearing or visual  impairment. 5. Diet and physical activities 6. Evidence for depression or mood disorder  Subjective:   Review of Systems:   Constitutional: Denies fever, malaise, fatigue, headache or abrupt weight changes.  HEENT: Denies eye pain, eye redness, ear pain, ringing in the ears, wax buildup, runny nose, nasal congestion, bloody nose, or sore throat. Respiratory: Denies difficulty breathing, shortness of breath, cough or sputum production.   Cardiovascular: Pt reports swelling in her legs. Denies chest pain, chest tightness, palpitations or swelling in the hands.  Gastrointestinal: Pt reports intermittent reflux, constipation. Denies abdominal pain, bloating, diarrhea or blood in the stool.  GU: Denies urgency, frequency, pain with urination, burning sensation, blood in urine, odor or discharge. Musculoskeletal: Pt reports joint pain. Denies decrease in range of motion, difficulty with gait, muscle pain or joint swelling.  Skin: Denies redness, rashes, lesions or ulcercations.  Neurological: Denies dizziness, difficulty with memory, difficulty with speech or problems with balance and coordination.  Psych: Denies anxiety, depression, SI/HI.  No other specific complaints in a complete review of systems (except as listed in HPI above).  Objective:  PE:   BP 124/80   Pulse 65   Temp 98.1 F (36.7 C) (Oral)   Ht 5\' 1"  (1.549 m)   Wt 270 lb (122.5 kg)   SpO2 98%   BMI 51.02 kg/m   Wt Readings from Last 3 Encounters:  02/06/18 270 lb (122.5 kg)  02/28/17 269 lb 12.8 oz (122.4 kg)  02/01/17 263 lb 8 oz (119.5 kg)    General: Appears her stated age, obese in NAD. Skin: Warm, dry and intact. No rashes, lesions or ulcerations noted. HEENT: Head: normal shape and size; Eyes: sclera white, no icterus, conjunctiva pink, PERRLA and EOMs intact; Ears: bilateral cerumen impaction; Throat/Mouth: Teeth present, mucosa pink and moist, no exudate, lesions or ulcerations noted.  Neck: Neck supple,  trachea midline. No masses, lumps or thyromegaly present.  Cardiovascular: Normal rate and rhythm. Murmur noted. 2+ BLE edema. No carotid bruits noted. Pulmonary/Chest: Normal effort and positive vesicular breath sounds. No respiratory distress. No wheezes, rales or ronchi noted.  Abdomen: Soft and nontender. Normal bowel sounds. No distention or masses noted. Liver, spleen and kidneys non palpable. Musculoskeletal: Strength 5/5 BUE/BLE. No signs of joint swelling.  Neurological: Alert and oriented. Cranial nerves II-XII grossly intact. Coordination normal.  Psychiatric: Mood and affect normal. Behavior is normal. Judgment and thought content normal.     BMET  Component Value Date/Time   NA 139 02/28/2017 1151   K 3.9 02/28/2017 1151   CL 107 02/28/2017 1151   CO2 24 02/28/2017 1151   GLUCOSE 107 (H) 02/28/2017 1151   BUN 12 02/28/2017 1151   CREATININE 0.85 02/28/2017 1151   CALCIUM 9.1 02/28/2017 1151   GFRNONAA >60 02/28/2017 1151   GFRAA >60 02/28/2017 1151    Lipid Panel     Component Value Date/Time   CHOL 195 02/01/2017 1649   TRIG 69.0 02/01/2017 1649   HDL 76.60 02/01/2017 1649   CHOLHDL 3 02/01/2017 1649   VLDL 13.8 02/01/2017 1649   LDLCALC 104 (H) 02/01/2017 1649    CBC    Component Value Date/Time   WBC 5.1 02/28/2017 1151   RBC 4.27 02/28/2017 1151   HGB 13.0 02/28/2017 1151   HCT 40.1 02/28/2017 1151   PLT 239 02/28/2017 1151   MCV 93.9 02/28/2017 1151   MCH 30.4 02/28/2017 1151   MCHC 32.4 02/28/2017 1151   RDW 13.2 02/28/2017 1151   LYMPHSABS 1.7 08/21/2008 1405   MONOABS 0.7 08/21/2008 1405   EOSABS 0.2 08/21/2008 1405   BASOSABS 0.1 08/21/2008 1405    Hgb A1C Lab Results  Component Value Date   HGBA1C 5.5 02/01/2017      Assessment and Plan:   Medicare Annual Wellness Visit:  Diet: She does eat meat. She consumes fruits and veggies daily. She occasionally eats fried foods. She drinks mostly water, juice, Gatorade Physical  activity: 45 minute exercises 2-3 days per week Depression/mood screen: Negative Hearing: Intact to whispered voice Visual acuity: Grossly normal, performs annual eye exam  ADLs: Capable Fall risk: None Home safety: Good Cognitive evaluation: Intact to orientation, naming, recall and repetition EOL planning: No adv directives, full code/ I agree  Preventative Medicine:  Encouraged her to get a flu shot in the fall. Tetanus, pneumovax and prevnar UTD. She declines zostovax or shingrix. She will schedule her mammogram for December 2019. She no longer needs pap smears. Bone density due 2021. Colon screening UTD. Encouraged her to consume a balanced diet and exercise regimen. Advised her to see an eye doctor and dentist annually. Will check CBC, CMET, Lipid and Vit D today.   Next appointment: 1 year, Medicare Wellness  Exam   Webb Silversmith, NP

## 2018-02-06 NOTE — Patient Instructions (Signed)
Health Maintenance for Postmenopausal Women Menopause is a normal process in which your reproductive ability comes to an end. This process happens gradually over a span of months to years, usually between the ages of 22 and 9. Menopause is complete when you have missed 12 consecutive menstrual periods. It is important to talk with your health care provider about some of the most common conditions that affect postmenopausal women, such as heart disease, cancer, and bone loss (osteoporosis). Adopting a healthy lifestyle and getting preventive care can help to promote your health and wellness. Those actions can also lower your chances of developing some of these common conditions. What should I know about menopause? During menopause, you may experience a number of symptoms, such as:  Moderate-to-severe hot flashes.  Night sweats.  Decrease in sex drive.  Mood swings.  Headaches.  Tiredness.  Irritability.  Memory problems.  Insomnia.  Choosing to treat or not to treat menopausal changes is an individual decision that you make with your health care provider. What should I know about hormone replacement therapy and supplements? Hormone therapy products are effective for treating symptoms that are associated with menopause, such as hot flashes and night sweats. Hormone replacement carries certain risks, especially as you become older. If you are thinking about using estrogen or estrogen with progestin treatments, discuss the benefits and risks with your health care provider. What should I know about heart disease and stroke? Heart disease, heart attack, and stroke become more likely as you age. This may be due, in part, to the hormonal changes that your body experiences during menopause. These can affect how your body processes dietary fats, triglycerides, and cholesterol. Heart attack and stroke are both medical emergencies. There are many things that you can do to help prevent heart disease  and stroke:  Have your blood pressure checked at least every 1-2 years. High blood pressure causes heart disease and increases the risk of stroke.  If you are 53-22 years old, ask your health care provider if you should take aspirin to prevent a heart attack or a stroke.  Do not use any tobacco products, including cigarettes, chewing tobacco, or electronic cigarettes. If you need help quitting, ask your health care provider.  It is important to eat a healthy diet and maintain a healthy weight. ? Be sure to include plenty of vegetables, fruits, low-fat dairy products, and lean protein. ? Avoid eating foods that are high in solid fats, added sugars, or salt (sodium).  Get regular exercise. This is one of the most important things that you can do for your health. ? Try to exercise for at least 150 minutes each week. The type of exercise that you do should increase your heart rate and make you sweat. This is known as moderate-intensity exercise. ? Try to do strengthening exercises at least twice each week. Do these in addition to the moderate-intensity exercise.  Know your numbers.Ask your health care provider to check your cholesterol and your blood glucose. Continue to have your blood tested as directed by your health care provider.  What should I know about cancer screening? There are several types of cancer. Take the following steps to reduce your risk and to catch any cancer development as early as possible. Breast Cancer  Practice breast self-awareness. ? This means understanding how your breasts normally appear and feel. ? It also means doing regular breast self-exams. Let your health care provider know about any changes, no matter how small.  If you are 40  or older, have a clinician do a breast exam (clinical breast exam or CBE) every year. Depending on your age, family history, and medical history, it may be recommended that you also have a yearly breast X-ray (mammogram).  If you  have a family history of breast cancer, talk with your health care provider about genetic screening.  If you are at high risk for breast cancer, talk with your health care provider about having an MRI and a mammogram every year.  Breast cancer (BRCA) gene test is recommended for women who have family members with BRCA-related cancers. Results of the assessment will determine the need for genetic counseling and BRCA1 and for BRCA2 testing. BRCA-related cancers include these types: ? Breast. This occurs in males or females. ? Ovarian. ? Tubal. This may also be called fallopian tube cancer. ? Cancer of the abdominal or pelvic lining (peritoneal cancer). ? Prostate. ? Pancreatic.  Cervical, Uterine, and Ovarian Cancer Your health care provider may recommend that you be screened regularly for cancer of the pelvic organs. These include your ovaries, uterus, and vagina. This screening involves a pelvic exam, which includes checking for microscopic changes to the surface of your cervix (Pap test).  For women ages 21-65, health care providers may recommend a pelvic exam and a Pap test every three years. For women ages 79-65, they may recommend the Pap test and pelvic exam, combined with testing for human papilloma virus (HPV), every five years. Some types of HPV increase your risk of cervical cancer. Testing for HPV may also be done on women of any age who have unclear Pap test results.  Other health care providers may not recommend any screening for nonpregnant women who are considered low risk for pelvic cancer and have no symptoms. Ask your health care provider if a screening pelvic exam is right for you.  If you have had past treatment for cervical cancer or a condition that could lead to cancer, you need Pap tests and screening for cancer for at least 20 years after your treatment. If Pap tests have been discontinued for you, your risk factors (such as having a new sexual partner) need to be  reassessed to determine if you should start having screenings again. Some women have medical problems that increase the chance of getting cervical cancer. In these cases, your health care provider may recommend that you have screening and Pap tests more often.  If you have a family history of uterine cancer or ovarian cancer, talk with your health care provider about genetic screening.  If you have vaginal bleeding after reaching menopause, tell your health care provider.  There are currently no reliable tests available to screen for ovarian cancer.  Lung Cancer Lung cancer screening is recommended for adults 69-62 years old who are at high risk for lung cancer because of a history of smoking. A yearly low-dose CT scan of the lungs is recommended if you:  Currently smoke.  Have a history of at least 30 pack-years of smoking and you currently smoke or have quit within the past 15 years. A pack-year is smoking an average of one pack of cigarettes per day for one year.  Yearly screening should:  Continue until it has been 15 years since you quit.  Stop if you develop a health problem that would prevent you from having lung cancer treatment.  Colorectal Cancer  This type of cancer can be detected and can often be prevented.  Routine colorectal cancer screening usually begins at  age 42 and continues through age 45.  If you have risk factors for colon cancer, your health care provider may recommend that you be screened at an earlier age.  If you have a family history of colorectal cancer, talk with your health care provider about genetic screening.  Your health care provider may also recommend using home test kits to check for hidden blood in your stool.  A small camera at the end of a tube can be used to examine your colon directly (sigmoidoscopy or colonoscopy). This is done to check for the earliest forms of colorectal cancer.  Direct examination of the colon should be repeated every  5-10 years until age 71. However, if early forms of precancerous polyps or small growths are found or if you have a family history or genetic risk for colorectal cancer, you may need to be screened more often.  Skin Cancer  Check your skin from head to toe regularly.  Monitor any moles. Be sure to tell your health care provider: ? About any new moles or changes in moles, especially if there is a change in a mole's shape or color. ? If you have a mole that is larger than the size of a pencil eraser.  If any of your family members has a history of skin cancer, especially at a young age, talk with your health care provider about genetic screening.  Always use sunscreen. Apply sunscreen liberally and repeatedly throughout the day.  Whenever you are outside, protect yourself by wearing long sleeves, pants, a wide-brimmed hat, and sunglasses.  What should I know about osteoporosis? Osteoporosis is a condition in which bone destruction happens more quickly than new bone creation. After menopause, you may be at an increased risk for osteoporosis. To help prevent osteoporosis or the bone fractures that can happen because of osteoporosis, the following is recommended:  If you are 46-71 years old, get at least 1,000 mg of calcium and at least 600 mg of vitamin D per day.  If you are older than age 55 but younger than age 65, get at least 1,200 mg of calcium and at least 600 mg of vitamin D per day.  If you are older than age 54, get at least 1,200 mg of calcium and at least 800 mg of vitamin D per day.  Smoking and excessive alcohol intake increase the risk of osteoporosis. Eat foods that are rich in calcium and vitamin D, and do weight-bearing exercises several times each week as directed by your health care provider. What should I know about how menopause affects my mental health? Depression may occur at any age, but it is more common as you become older. Common symptoms of depression  include:  Low or sad mood.  Changes in sleep patterns.  Changes in appetite or eating patterns.  Feeling an overall lack of motivation or enjoyment of activities that you previously enjoyed.  Frequent crying spells.  Talk with your health care provider if you think that you are experiencing depression. What should I know about immunizations? It is important that you get and maintain your immunizations. These include:  Tetanus, diphtheria, and pertussis (Tdap) booster vaccine.  Influenza every year before the flu season begins.  Pneumonia vaccine.  Shingles vaccine.  Your health care provider may also recommend other immunizations. This information is not intended to replace advice given to you by your health care provider. Make sure you discuss any questions you have with your health care provider. Document Released: 08/12/2005  Document Revised: 01/08/2016 Document Reviewed: 03/24/2015 [employer] Interactive Patient Education  2018 [employer]  

## 2018-02-06 NOTE — Assessment & Plan Note (Signed)
Encouraged low salt diet, exercise for weight loss Encouraged elevation Discussed use of TED hose but she declines at this time Lasix refilled today CMET today

## 2018-02-06 NOTE — Assessment & Plan Note (Signed)
Will repeat bone density 2021 Continue Calcium and Vit D Encouraged 30 minutes of weight bearing exercise daily

## 2018-06-13 ENCOUNTER — Encounter: Payer: Self-pay | Admitting: Internal Medicine

## 2018-06-13 DIAGNOSIS — Z1231 Encounter for screening mammogram for malignant neoplasm of breast: Secondary | ICD-10-CM | POA: Diagnosis not present

## 2018-07-25 DIAGNOSIS — R928 Other abnormal and inconclusive findings on diagnostic imaging of breast: Secondary | ICD-10-CM | POA: Diagnosis not present

## 2018-07-25 DIAGNOSIS — R921 Mammographic calcification found on diagnostic imaging of breast: Secondary | ICD-10-CM | POA: Diagnosis not present

## 2018-07-26 ENCOUNTER — Ambulatory Visit: Payer: Self-pay | Admitting: *Deleted

## 2018-07-26 NOTE — Telephone Encounter (Signed)
Patient states she has pain in right side when she is bending and moving.Started last week. Patient states she does not have abdominal pain or back pain. Pain is only with movement.Choose flank pain protocol- no side pain protocol. Patient states she does have some pain in lower right front/side in triage- she is not sure if she pulled a muscle or is having trouble with her bowels. Patient is passing gas and having bowel movements.   Patient also feels she is not emptying completely when she has BM.Patient will go in the morning and then after her coffee- she feels that her bowels are not completely emptying when she goes- main complaint today is the side pain.  Reason for Disposition . MODERATE pain (e.g., interferes with normal activities or awakens from sleep)  Answer Assessment - Initial Assessment Questions 1. LOCATION: "Where does it hurt?" (e.g., left, right)     Right- lower more in the front 2. ONSET: "When did the pain start?"     7 days 3. SEVERITY: "How bad is the pain?" (e.g., Scale 1-10; mild, moderate, or severe)   - MILD (1-3): doesn't interfere with normal activities    - MODERATE (4-7): interferes with normal activities or awakens from sleep    - SEVERE (8-10): excruciating pain and patient unable to do normal activities (stays in bed)       moderate 4. PATTERN: "Does the pain come and go, or is it constant?"      Comes and goes 5. CAUSE: "What do you think is causing the pain?"     Unsure-intestinal  6. OTHER SYMPTOMS:  "Do you have any other symptoms?" (e.g., fever, abdominal pain, vomiting, leg weakness, burning with urination, blood in urine)     no 7. PREGNANCY:  "Is there any chance you are pregnant?" "When was your last menstrual period?"     n/a  Protocols used: FLANK PAIN-A-AH

## 2018-07-26 NOTE — Telephone Encounter (Signed)
I spoke with pt; pt is not running fever; no pain now. Pt said rt side pain comes and goes for about 1 wk. If pt condition changes or worsens prior to appt pt will go to ED for eval. FYI to Avie Echevaria NP. [facility] triaged pt and scheduled appt 07/27/18 at 2pm; this is a 28' appt; R Baity NP does not have any other available appts. Please advise.

## 2018-07-27 ENCOUNTER — Ambulatory Visit (INDEPENDENT_AMBULATORY_CARE_PROVIDER_SITE_OTHER)
Admission: RE | Admit: 2018-07-27 | Discharge: 2018-07-27 | Disposition: A | Discharge status: Home / Self Care | Payer: Medicare HMO | Source: Ambulatory Visit | Attending: Internal Medicine | Admitting: Internal Medicine

## 2018-07-27 ENCOUNTER — Ambulatory Visit (INDEPENDENT_AMBULATORY_CARE_PROVIDER_SITE_OTHER): Payer: Medicare HMO | Admitting: Internal Medicine

## 2018-07-27 ENCOUNTER — Encounter: Payer: Self-pay | Admitting: Internal Medicine

## 2018-07-27 VITALS — BP 124/78 | HR 87 | Temp 98.7°F | Wt 274.0 lb

## 2018-07-27 DIAGNOSIS — Z23 Encounter for immunization: Secondary | ICD-10-CM | POA: Diagnosis not present

## 2018-07-27 DIAGNOSIS — R1031 Right lower quadrant pain: Secondary | ICD-10-CM

## 2018-07-27 DIAGNOSIS — Z6841 Body Mass Index (BMI) 40.0 and over, adult: Secondary | ICD-10-CM | POA: Diagnosis not present

## 2018-07-27 LAB — POC URINALSYSI DIPSTICK (AUTOMATED)
Bilirubin, UA: NEGATIVE
GLUCOSE UA: NEGATIVE
KETONES UA: NEGATIVE
Leukocytes, UA: NEGATIVE
Nitrite, UA: NEGATIVE
PROTEIN UA: NEGATIVE
RBC UA: NEGATIVE
SPEC GRAV UA: 1.025 (ref 1.010–1.025)
UROBILINOGEN UA: 0.2 U/dL
pH, UA: 5.5 (ref 5.0–8.0)

## 2018-07-27 NOTE — Addendum Note (Signed)
Addended by: Lurlean Nanny on: 07/27/2018 02:27 PM   Modules accepted: Orders

## 2018-07-27 NOTE — Progress Notes (Signed)
Subjective:    Patient ID: Julia Mcfarland, female    DOB: 09/07/1945, 73 y.o.   MRN: [id number]  HPI  Pt presents to the clinic today with c/o right lower quadrant pain. She reports this started 1 week ago. The pain is intermittent.  She describes the pain as achy. The pain does not radiate. The pain is worse with bending or twisting. She denies any injury to the area but reports she has been cleaning out her garage the last 2 weeks. She denies nausea, vomiting, diarrhea, constipation or blood in her stools, but she does not feel like she is emptying her bowels completely after a BM. She denies urinary frequency, dysuria or blood in her urine. She has had a total hysterectomy. Colonoscopy from 10/2014 reviewed- diverticulosis noted. She has not taken anything OTC for her symptoms.   Review of Systems      Past Medical History:  Diagnosis Date  . Allergy   . Arthritis   . Chicken pox   . Edema   . Heart murmur    YEARS  AGO  NO PROBLEM   . PONV (postoperative nausea and vomiting)     Current Outpatient Medications  Medication Sig Dispense Refill  . calcium carbonate (OS-CAL) 600 MG TABS tablet Take 600 mg by mouth 3 (three) times daily with meals.    . Cholecalciferol (VITAMIN D3) 5000 UNITS CAPS Take 5,000 Units by mouth daily.     . furosemide (LASIX) 20 MG tablet Take 1 tablet (20 mg total) by mouth daily. 90 tablet 3  . vitamin C (ASCORBIC ACID) 500 MG tablet Take 500 mg by mouth daily.     No current facility-administered medications for this visit.     No Known Allergies  Family History  Problem Relation Age of Onset  . Arthritis Mother   . Heart disease Mother   . Hypertension Mother   . Diabetes Mother   . Stroke Father   . Hypertension Father   . Stroke Sister   . Hypertension Sister   . Heart disease Maternal Grandfather   . Diabetes Paternal Grandmother   . Heart disease Paternal Grandfather     Social History   Socioeconomic History  . Marital status:  Married    Spouse name: Not on file  . Number of children: Not on file  . Years of education: Not on file  . Highest education level: Not on file  Occupational History  . Not on file  Social Needs  . Financial resource strain: Not on file  . Food insecurity:    Worry: Not on file    Inability: Not on file  . Transportation needs:    Medical: Not on file    Non-medical: Not on file  Tobacco Use  . Smoking status: Never Smoker  . Smokeless tobacco: Never Used  Substance and Sexual Activity  . Alcohol use: Yes    Comment: occasional  . Drug use: No  . Sexual activity: Never  Lifestyle  . Physical activity:    Days per week: Not on file    Minutes per session: Not on file  . Stress: Not on file  Relationships  . Social connections:    Talks on phone: Not on file    Gets together: Not on file    Attends religious service: Not on file    Active member of club or organization: Not on file    Attends meetings of clubs or organizations: Not on file  Relationship status: Not on file  . Intimate partner violence:    Fear of current or ex partner: Not on file    Emotionally abused: Not on file    Physically abused: Not on file    Forced sexual activity: Not on file  Other Topics Concern  . Not on file  Social History Narrative  . Not on file     Constitutional: Denies fever, malaise, fatigue, headache or abrupt weight changes.  Respiratory: Denies difficulty breathing, shortness of breath, cough or sputum production.   Cardiovascular: Denies chest pain, chest tightness, palpitations or swelling in the hands or feet.  Gastrointestinal: Pt reports right lower quadrant abdominal pain. Denies  bloating, constipation, diarrhea or blood in the stool.  GU: Denies urgency, frequency, pain with urination, burning sensation, blood in urine, odor or discharge. Musculoskeletal: Denies decrease in range of motion, difficulty with gait, muscle pain or joint pain and swelling.   No  other specific complaints in a complete review of systems (except as listed in HPI above).  Objective:   Physical Exam    BP 124/78   Pulse 87   Temp 98.7 F (37.1 C) (Oral)   Wt 274 lb (124.3 kg)   SpO2 97%   BMI 51.77 kg/m  Wt Readings from Last 3 Encounters:  07/27/18 274 lb (124.3 kg)  02/06/18 270 lb (122.5 kg)  02/28/17 269 lb 12.8 oz (122.4 kg)    General: Appears her stated age, obese, in NAD. Skin: Warm, dry and intact. No rashes noted of abdominal area. Cardiovascular: Normal rate and rhythm. Murmur noted. Pulmonary/Chest: Normal effort and positive vesicular breath sounds. No respiratory distress. No wheezes, rales or ronchi noted.  Abdomen: Soft and mildly tender in the RLQ. Negative rebound tenderness. Hypoactive bowel sounds. No distention or masses noted.  Musculoskeletal: Normal flexion, extension, adduction, abduction of the right hip. Normal internal and external rotation of the right hip. No pain with palpation of the right hip. No difficulty with gait. Neurological: Alert and oriented.    BMET    Component Value Date/Time   NA 142 02/06/2018 0957   K 3.8 02/06/2018 0957   CL 106 02/06/2018 0957   CO2 28 02/06/2018 0957   GLUCOSE 127 (H) 02/06/2018 0957   BUN 16 02/06/2018 0957   CREATININE 0.89 02/06/2018 0957   CALCIUM 9.1 02/06/2018 0957   GFRNONAA >60 02/28/2017 1151   GFRAA >60 02/28/2017 1151    Lipid Panel     Component Value Date/Time   CHOL 171 02/06/2018 0957   TRIG 61.0 02/06/2018 0957   HDL 66.80 02/06/2018 0957   CHOLHDL 3 02/06/2018 0957   VLDL 12.2 02/06/2018 0957   LDLCALC 92 02/06/2018 0957    CBC    Component Value Date/Time   WBC 5.2 02/06/2018 0957   RBC 3.96 02/06/2018 0957   HGB 12.3 02/06/2018 0957   HCT 36.5 02/06/2018 0957   PLT 239.0 02/06/2018 0957   MCV 92.3 02/06/2018 0957   MCH 30.4 02/28/2017 1151   MCHC 33.8 02/06/2018 0957   RDW 13.5 02/06/2018 0957   LYMPHSABS 1.7 08/21/2008 1405   MONOABS 0.7  08/21/2008 1405   EOSABS 0.2 08/21/2008 1405   BASOSABS 0.1 08/21/2008 1405    Hgb A1C Lab Results  Component Value Date   HGBA1C 5.5 02/01/2017          Assessment & Plan:   RLQ Pain:  Constipation vs abdominal muscle pain Urinalysis: normal Will obtain KUB today Discussed ways  to increase fiber in diet Encouraged her to consume at least 48 oz of water daily Advised her to try stretching, heat  Can take Ibuprofen 400 mg every 8 hours as needed for pain  Will follow up after xray, return precautions discussed Webb Silversmith, NP

## 2018-07-27 NOTE — Patient Instructions (Signed)

## 2018-08-09 DIAGNOSIS — R920 Mammographic microcalcification found on diagnostic imaging of breast: Secondary | ICD-10-CM | POA: Diagnosis not present

## 2018-08-09 DIAGNOSIS — R921 Mammographic calcification found on diagnostic imaging of breast: Secondary | ICD-10-CM | POA: Diagnosis not present

## 2018-08-09 DIAGNOSIS — N6092 Unspecified benign mammary dysplasia of left breast: Secondary | ICD-10-CM | POA: Diagnosis not present

## 2018-08-09 DIAGNOSIS — D242 Benign neoplasm of left breast: Secondary | ICD-10-CM | POA: Diagnosis not present

## 2019-01-29 ENCOUNTER — Other Ambulatory Visit: Payer: Self-pay | Admitting: Internal Medicine

## 2019-02-04 DIAGNOSIS — H2513 Age-related nuclear cataract, bilateral: Secondary | ICD-10-CM | POA: Diagnosis not present

## 2019-02-04 DIAGNOSIS — H43393 Other vitreous opacities, bilateral: Secondary | ICD-10-CM | POA: Diagnosis not present

## 2019-02-04 DIAGNOSIS — H04123 Dry eye syndrome of bilateral lacrimal glands: Secondary | ICD-10-CM | POA: Diagnosis not present

## 2019-02-04 DIAGNOSIS — H40013 Open angle with borderline findings, low risk, bilateral: Secondary | ICD-10-CM | POA: Diagnosis not present

## 2019-02-12 ENCOUNTER — Encounter: Payer: Medicare HMO | Admitting: Internal Medicine

## 2019-02-18 ENCOUNTER — Ambulatory Visit (INDEPENDENT_AMBULATORY_CARE_PROVIDER_SITE_OTHER): Payer: Medicare HMO | Admitting: Internal Medicine

## 2019-02-18 ENCOUNTER — Encounter: Payer: Self-pay | Admitting: Internal Medicine

## 2019-02-18 ENCOUNTER — Other Ambulatory Visit: Payer: Self-pay

## 2019-02-18 VITALS — BP 124/84 | HR 64 | Temp 98.4°F | Ht 61.0 in | Wt 282.0 lb

## 2019-02-18 DIAGNOSIS — Z0000 Encounter for general adult medical examination without abnormal findings: Secondary | ICD-10-CM | POA: Diagnosis not present

## 2019-02-18 DIAGNOSIS — M810 Age-related osteoporosis without current pathological fracture: Secondary | ICD-10-CM | POA: Diagnosis not present

## 2019-02-18 DIAGNOSIS — I872 Venous insufficiency (chronic) (peripheral): Secondary | ICD-10-CM | POA: Insufficient documentation

## 2019-02-18 LAB — HEMOGLOBIN A1C: Hgb A1c MFr Bld: 5.9 % (ref 4.6–6.5)

## 2019-02-18 LAB — LIPID PANEL
Cholesterol: 186 mg/dL (ref 0–200)
HDL: 68.9 mg/dL (ref >39.00)
LDL Cholesterol: 104 mg/dL — ABNORMAL HIGH (ref 0–99)
NonHDL: 116.77
Total CHOL/HDL Ratio: 3
Triglycerides: 62 mg/dL (ref 0.0–149.0)
VLDL: 12.4 mg/dL (ref 0.0–40.0)

## 2019-02-18 LAB — CBC
HCT: 38.3 % (ref 36.0–46.0)
Hemoglobin: 12.9 g/dL (ref 12.0–15.0)
MCHC: 33.5 g/dL (ref 30.0–36.0)
MCV: 94.2 fl (ref 78.0–100.0)
Platelets: 239 10*3/uL (ref 150.0–400.0)
RBC: 4.07 Mil/uL (ref 3.87–5.11)
RDW: 13.3 % (ref 11.5–15.5)
WBC: 5 10*3/uL (ref 4.0–10.5)

## 2019-02-18 LAB — COMPREHENSIVE METABOLIC PANEL
ALT: 15 U/L (ref 0–35)
AST: 13 U/L (ref 0–37)
Albumin: 4.1 g/dL (ref 3.5–5.2)
Alkaline Phosphatase: 62 U/L (ref 39–117)
BUN: 13 mg/dL (ref 6–23)
CO2: 27 mEq/L (ref 19–32)
Calcium: 8.9 mg/dL (ref 8.4–10.5)
Chloride: 106 mEq/L (ref 96–112)
Creatinine, Ser: 0.89 mg/dL (ref 0.40–1.20)
GFR: 75.44 mL/min (ref >60.00)
Glucose, Bld: 128 mg/dL — ABNORMAL HIGH (ref 70–99)
Potassium: 4 mEq/L (ref 3.5–5.1)
Sodium: 141 mEq/L (ref 135–145)
Total Bilirubin: 0.6 mg/dL (ref 0.2–1.2)
Total Protein: 6.7 g/dL (ref 6.0–8.3)

## 2019-02-18 LAB — VITAMIN D 25 HYDROXY (VIT D DEFICIENCY, FRACTURES): VITD: 42.62 ng/mL (ref 30.00–100.00)

## 2019-02-18 NOTE — Assessment & Plan Note (Signed)
Encouraged continued weight bearing Continue Vit D OTC Will obtain bone density exam in 2021

## 2019-02-18 NOTE — Progress Notes (Signed)
HPI:  Pt presents to the clinic today with her annual subsequent Medicare Wellness Exam.  Edema: She is taking Lasix as needed. She does feel like she is having more swelling in her lower extremities and hands. She denies shortness of breath.   Osteoporosis: She continues to weight bear. She is taking Vit D OTC daily.  Past Medical History:  Diagnosis Date  . Allergy   . Arthritis   . Chicken pox   . Edema   . Heart murmur    YEARS  AGO  NO PROBLEM   . PONV (postoperative nausea and vomiting)     Current Outpatient Medications  Medication Sig Dispense Refill  . Cholecalciferol (VITAMIN D3) 5000 UNITS CAPS Take 5,000 Units by mouth daily.     . furosemide (LASIX) 20 MG tablet TAKE 1 TABLET BY MOUTH EVERY DAY 90 tablet 0  . vitamin B-12 (CYANOCOBALAMIN) 500 MCG tablet Take 500 mcg by mouth daily.     No current facility-administered medications for this visit.     No Known Allergies  Family History  Problem Relation Age of Onset  . Arthritis Mother   . Heart disease Mother   . Hypertension Mother   . Diabetes Mother   . Stroke Father   . Hypertension Father   . Stroke Sister   . Hypertension Sister   . Heart disease Maternal Grandfather   . Diabetes Paternal Grandmother   . Heart disease Paternal Grandfather     Social History   Socioeconomic History  . Marital status: Married    Spouse name: Not on file  . Number of children: Not on file  . Years of education: Not on file  . Highest education level: Not on file  Occupational History  . Not on file  Social Needs  . Financial resource strain: Not on file  . Food insecurity    Worry: Not on file    Inability: Not on file  . Transportation needs    Medical: Not on file    Non-medical: Not on file  Tobacco Use  . Smoking status: Never Smoker  . Smokeless tobacco: Never Used  Substance and Sexual Activity  . Alcohol use: Yes    Comment: occasional  . Drug use: No  . Sexual activity: Never  Lifestyle  .  Physical activity    Days per week: Not on file    Minutes per session: Not on file  . Stress: Not on file  Relationships  . Social Herbalist on phone: Not on file    Gets together: Not on file    Attends religious service: Not on file    Active member of club or organization: Not on file    Attends meetings of clubs or organizations: Not on file    Relationship status: Not on file  . Intimate partner violence    Fear of current or ex partner: Not on file    Emotionally abused: Not on file    Physically abused: Not on file    Forced sexual activity: Not on file  Other Topics Concern  . Not on file  Social History Narrative  . Not on file    Hospitiliaztions: None  Health Maintenance:    Flu: 07/2018  Tetanus: 08/2013  Pneumovax: 12/2015  Prevnar: 08/2014  Zostavax: never  Shingrix: never  Mammogram: 06/2018  Pap Smear: 08/2014  Bone Density: 09/2014  Colon Screening: 10/2014  Eye Doctor: annually  Dental Exam: biannually  Providers:   PCP: Webb Silversmith, NP   I have personally reviewed and have noted:  1. The patient's medical and social history 2. Their use of alcohol, tobacco or illicit drugs 3. Their current medications and supplements 4. The patient's functional ability including ADL's, fall risks, home safety risks and hearing or visual impairment. 5. Diet and physical activities 6. Evidence for depression or mood disorder  Subjective:   Review of Systems:   Constitutional: Denies fever, malaise, fatigue, headache or abrupt weight changes.  HEENT: Denies eye pain, eye redness, ear pain, ringing in the ears, wax buildup, runny nose, nasal congestion, bloody nose, or sore throat. Respiratory: Denies difficulty breathing, shortness of breath, cough or sputum production.   Cardiovascular: Pt reports swelling in legs. Denies chest pain, chest tightness, palpitations or swelling in the hands.  Gastrointestinal: Denies abdominal pain, bloating,  constipation, diarrhea or blood in the stool.  GU: Denies urgency, frequency, pain with urination, burning sensation, blood in urine, odor or discharge. Musculoskeletal: Denies decrease in range of motion, difficulty with gait, muscle pain or joint pain and swelling.  Skin: Denies redness, rashes, lesions or ulcercations.  Neurological: Denies dizziness, difficulty with memory, difficulty with speech or problems with balance and coordination.  Psych: Denies anxiety, depression, SI/HI.  No other specific complaints in a complete review of systems (except as listed in HPI above).  Objective:  PE:   BP 124/84   Pulse 64   Temp 98.4 F (36.9 C) (Temporal)   Ht 5\' 1"  (1.549 m)   Wt 282 lb (127.9 kg)   SpO2 98%   BMI 53.28 kg/m   Wt Readings from Last 3 Encounters:  07/27/18 274 lb (124.3 kg)  02/06/18 270 lb (122.5 kg)  02/28/17 269 lb 12.8 oz (122.4 kg)    General: Appears her stated age, obese, in NAD. Skin: Warm, dry and intact.  HEENT: Head: normal shape and size; Eyes: sclera white, no icterus, conjunctiva pink, PERRLA and EOMs intact; Ears: Tm's gray and intact, normal light reflex;  Neck: Neck supple, trachea midline. No masses, lumps or thyromegaly present.  Cardiovascular: Normal rate and rhythm. S1,S2 noted.  No murmur, rubs or gallops noted. No JVD. Trace nonpitting BLE edema. No carotid bruits noted. Pulmonary/Chest: Normal effort and positive vesicular breath sounds. No respiratory distress. No wheezes, rales or ronchi noted.  Abdomen: Soft and nontender. Normal bowel sounds. No distention or masses noted. Liver, spleen and kidneys non palpable. Musculoskeletal: Strength 5/5 BUE/BLE. No signs of joint swelling.  Neurological: Alert and oriented. Cranial nerves II-XII grossly intact. Coordination normal.  Psychiatric: Mood and affect normal. Behavior is normal. Judgment and thought content normal.    BMET    Component Value Date/Time   NA 142 02/06/2018 0957   K  3.8 02/06/2018 0957   CL 106 02/06/2018 0957   CO2 28 02/06/2018 0957   GLUCOSE 127 (H) 02/06/2018 0957   BUN 16 02/06/2018 0957   CREATININE 0.89 02/06/2018 0957   CALCIUM 9.1 02/06/2018 0957   GFRNONAA >60 02/28/2017 1151   GFRAA >60 02/28/2017 1151    Lipid Panel     Component Value Date/Time   CHOL 171 02/06/2018 0957   TRIG 61.0 02/06/2018 0957   HDL 66.80 02/06/2018 0957   CHOLHDL 3 02/06/2018 0957   VLDL 12.2 02/06/2018 0957   LDLCALC 92 02/06/2018 0957    CBC    Component Value Date/Time   WBC 5.2 02/06/2018 0957   RBC 3.96 02/06/2018 0957  HGB 12.3 02/06/2018 0957   HCT 36.5 02/06/2018 0957   PLT 239.0 02/06/2018 0957   MCV 92.3 02/06/2018 0957   MCH 30.4 02/28/2017 1151   MCHC 33.8 02/06/2018 0957   RDW 13.5 02/06/2018 0957   LYMPHSABS 1.7 08/21/2008 1405   MONOABS 0.7 08/21/2008 1405   EOSABS 0.2 08/21/2008 1405   BASOSABS 0.1 08/21/2008 1405    Hgb A1C Lab Results  Component Value Date   HGBA1C 5.5 02/01/2017      Assessment and Plan:   Medicare Annual Wellness Visit:  Diet: She does eat meat. She consumes fruits and veggies daily. She does eat some fried foods. She drinks mostly. Physical activity: Some yardwork Depression/mood screen: Negative, PHQ 9 score of 0 Hearing: Intact to whispered voice Visual acuity: Grossly normal, performs annual eye exam  ADLs: Capable Fall risk: None Home safety: Good Cognitive evaluation: Intact to orientation, naming, recall and repetition EOL planning: No adv directives, full code/ I agree  Preventative Medicine: Encouraged her to get a flu shot in the fall. Tetanus, pneumovax and prevnar UTD. She will get the Shingrix at her pharmacy this year. Mammogram due 06/2019. She no longer needs pap smears. Colon screening UTD. Encouraged her to consume a balanced diet and exercise regimen. Advised her to see an eye doctor and dentist annually. Will check CBC, CMET, Lipid, A1C and Vit D today. Due dates for  screening exams given to patient as part of her AVS.   Next appointment: 1 year, Medicare Wellness Exam   Webb Silversmith, NP

## 2019-02-18 NOTE — Assessment & Plan Note (Signed)
Encouraged weight loss and elevation when resting Encouraged low salt diet Continue Lasix daily CMET today

## 2019-02-18 NOTE — Patient Instructions (Signed)
Health Maintenance After Age 72 After age 72, you are at a higher risk for certain long-term diseases and infections as well as injuries from falls. Falls are a major cause of broken bones and head injuries in people who are older than age 72. Getting regular preventive care can help to keep you healthy and well. Preventive care includes getting regular testing and making lifestyle changes as recommended by your health care provider. Talk with your health care provider about:  Which screenings and tests you should have. A screening is a test that checks for a disease when you have no symptoms.  A diet and exercise plan that is right for you. What should I know about screenings and tests to prevent falls? Screening and testing are the best ways to find a health problem early. Early diagnosis and treatment give you the best chance of managing medical conditions that are common after age 72. Certain conditions and lifestyle choices may make you more likely to have a fall. Your health care provider may recommend:  Regular vision checks. Poor vision and conditions such as cataracts can make you more likely to have a fall. If you wear glasses, make sure to get your prescription updated if your vision changes.  Medicine review. Work with your health care provider to regularly review all of the medicines you are taking, including over-the-counter medicines. Ask your health care provider about any side effects that may make you more likely to have a fall. Tell your health care provider if any medicines that you take make you feel dizzy or sleepy.  Osteoporosis screening. Osteoporosis is a condition that causes the bones to get weaker. This can make the bones weak and cause them to break more easily.  Blood pressure screening. Blood pressure changes and medicines to control blood pressure can make you feel dizzy.  Strength and balance checks. Your health care provider may recommend certain tests to check your  strength and balance while standing, walking, or changing positions.  Foot health exam. Foot pain and numbness, as well as not wearing proper footwear, can make you more likely to have a fall.  Depression screening. You may be more likely to have a fall if you have a fear of falling, feel emotionally low, or feel unable to do activities that you used to do.  Alcohol use screening. Using too much alcohol can affect your balance and may make you more likely to have a fall. What actions can I take to lower my risk of falls? General instructions  Talk with your health care provider about your risks for falling. Tell your health care provider if: ? You fall. Be sure to tell your health care provider about all falls, even ones that seem minor. ? You feel dizzy, sleepy, or off-balance.  Take over-the-counter and prescription medicines only as told by your health care provider. These include any supplements.  Eat a healthy diet and maintain a healthy weight. A healthy diet includes low-fat dairy products, low-fat (lean) meats, and fiber from whole grains, beans, and lots of fruits and vegetables. Home safety  Remove any tripping hazards, such as rugs, cords, and clutter.  Install safety equipment such as grab bars in bathrooms and safety rails on stairs.  Keep rooms and walkways well-lit. Activity   Follow a regular exercise program to stay fit. This will help you maintain your balance. Ask your health care provider what types of exercise are appropriate for you.  If you need a cane or   walker, use it as recommended by your health care provider.  Wear supportive shoes that have nonskid soles. Lifestyle  Do not drink alcohol if your health care provider tells you not to drink.  If you drink alcohol, limit how much you have: ? 0-1 drink a day for women. ? 0-2 drinks a day for men.  Be aware of how much alcohol is in your drink. In the [place] one drink equals one typical bottle of beer (12  oz), one-half glass of wine (5 oz), or one shot of hard liquor (1 oz).  Do not use any products that contain nicotine or tobacco, such as cigarettes and e-cigarettes. If you need help quitting, ask your health care provider. Summary  Having a healthy lifestyle and getting preventive care can help to protect your health and wellness after age 72.  Screening and testing are the best way to find a health problem early and help you avoid having a fall. Early diagnosis and treatment give you the best chance for managing medical conditions that are more common for people who are older than age 72.  Falls are a major cause of broken bones and head injuries in people who are older than age 72. Take precautions to prevent a fall at home.  Work with your health care provider to learn what changes you can make to improve your health and wellness and to prevent falls. This information is not intended to replace advice given to you by your health care provider. Make sure you discuss any questions you have with your health care provider. Document Released: 05/03/2017 Document Revised: 10/11/2018 Document Reviewed: 05/03/2017 Elsevier Patient Education  2020 [employer]  

## 2019-02-20 IMAGING — CR DG CHEST 2V
2 series · 2 of 2 positions shown · non-contrast
Comparison: None.

CLINICAL DATA: Preop left total knee replacement

EXAM:
CHEST  2 VIEW

[w chest pa]
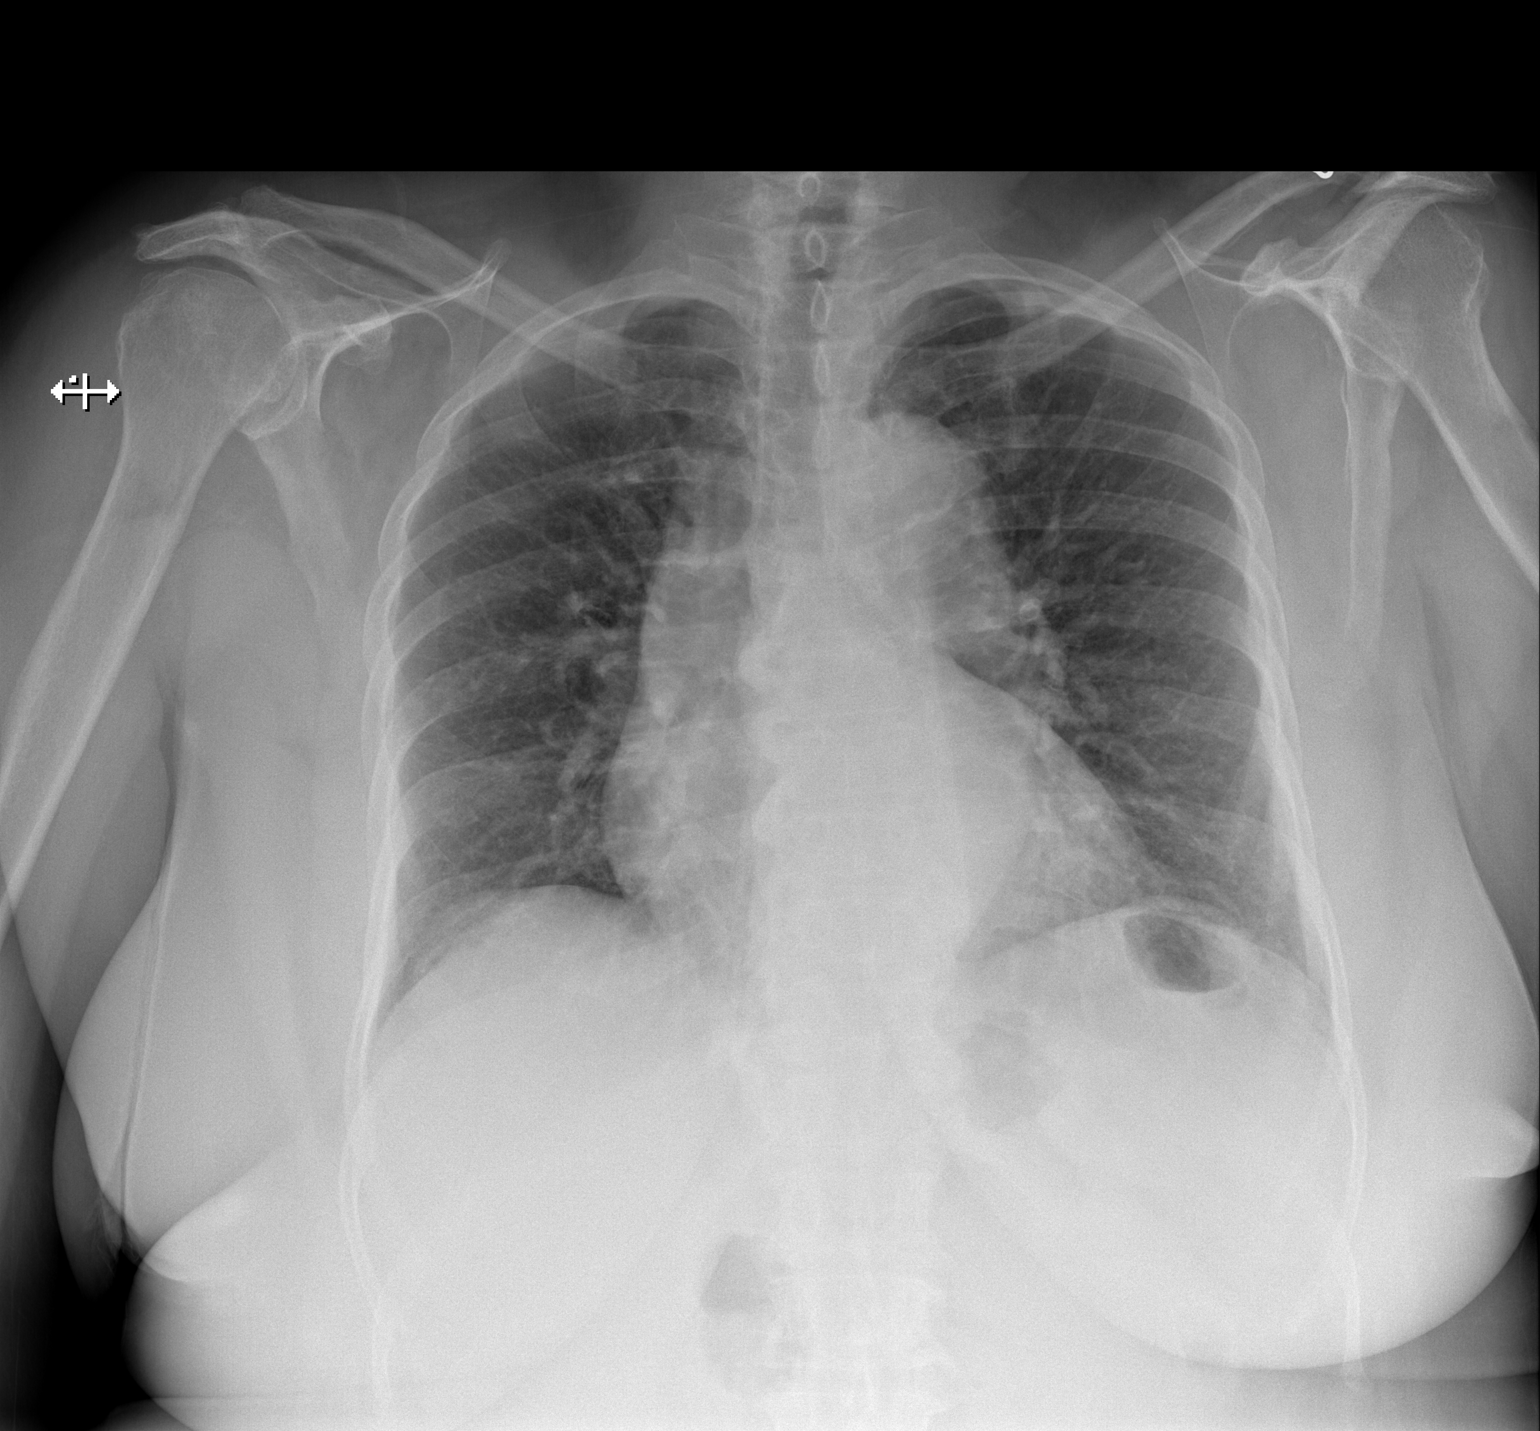

[w chest lat]
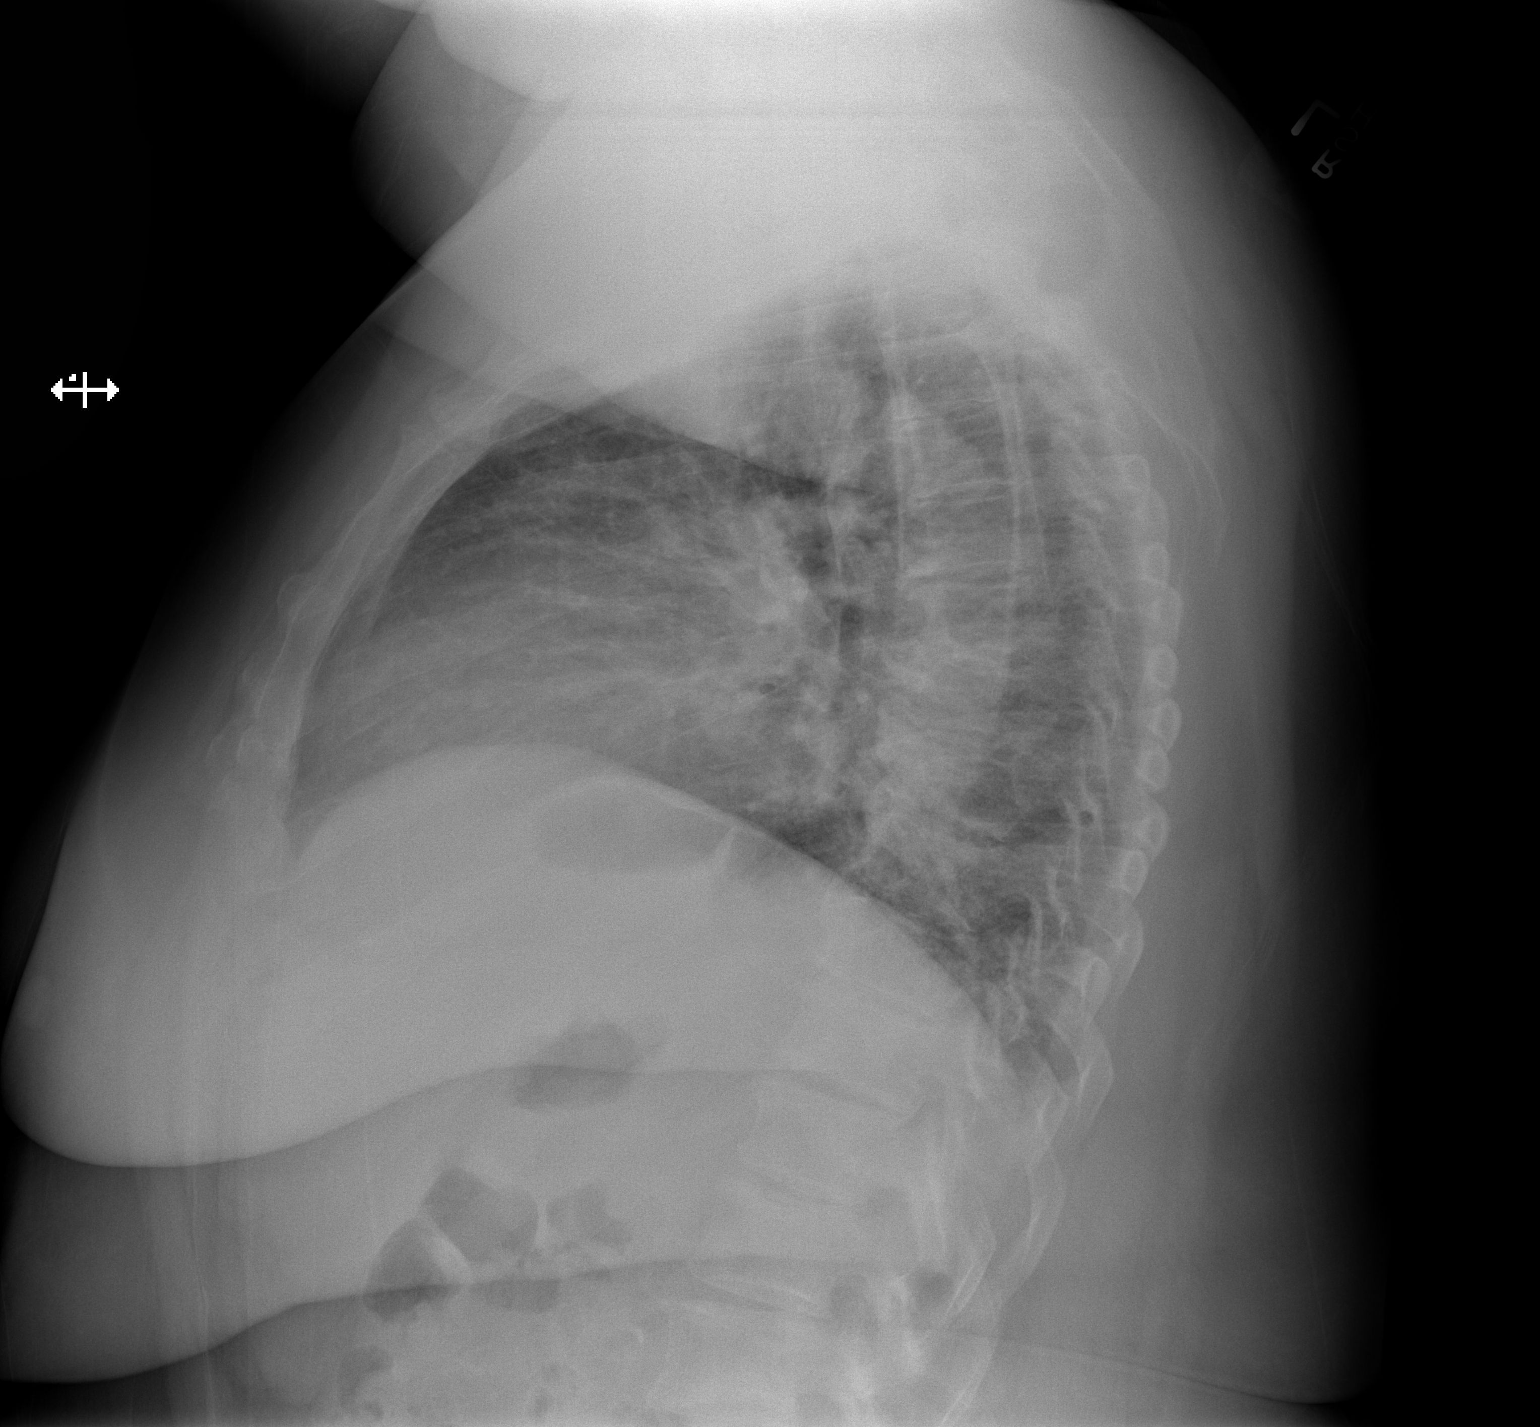

[2 of 2 positions shown; findings below may reference images not displayed]

FINDINGS: Heart and mediastinal contours are within normal limits. No focal
opacities or effusions. No acute bony abnormality. Aortic
calcifications with mild tortuosity.
IMPRESSION: No active cardiopulmonary disease.

## 2019-07-26 ENCOUNTER — Other Ambulatory Visit: Payer: Self-pay | Admitting: Internal Medicine

## 2019-07-26 DIAGNOSIS — Z1231 Encounter for screening mammogram for malignant neoplasm of breast: Secondary | ICD-10-CM

## 2019-09-02 ENCOUNTER — Other Ambulatory Visit: Payer: Self-pay

## 2019-09-02 ENCOUNTER — Ambulatory Visit
Admission: RE | Admit: 2019-09-02 | Discharge: 2019-09-02 | Disposition: A | Discharge status: Home / Self Care | Payer: Medicare HMO | Source: Ambulatory Visit | Attending: Internal Medicine | Admitting: Internal Medicine

## 2019-09-02 DIAGNOSIS — Z1231 Encounter for screening mammogram for malignant neoplasm of breast: Secondary | ICD-10-CM | POA: Diagnosis not present

## 2019-09-19 ENCOUNTER — Encounter: Payer: Self-pay | Admitting: Internal Medicine

## 2019-09-19 ENCOUNTER — Ambulatory Visit: Payer: Medicare HMO | Admitting: Internal Medicine

## 2019-09-19 ENCOUNTER — Ambulatory Visit (INDEPENDENT_AMBULATORY_CARE_PROVIDER_SITE_OTHER): Payer: Medicare HMO | Admitting: Internal Medicine

## 2019-09-19 ENCOUNTER — Other Ambulatory Visit: Payer: Self-pay

## 2019-09-19 VITALS — BP 122/84 | HR 60 | Temp 97.9°F | Wt 291.0 lb

## 2019-09-19 DIAGNOSIS — J4520 Mild intermittent asthma, uncomplicated: Secondary | ICD-10-CM | POA: Diagnosis not present

## 2019-09-19 DIAGNOSIS — I872 Venous insufficiency (chronic) (peripheral): Secondary | ICD-10-CM | POA: Diagnosis not present

## 2019-09-19 MED ORDER — ALBUTEROL SULFATE HFA 108 (90 BASE) MCG/ACT IN AERS: 2.0000 | INHALATION_SPRAY | Freq: Four times a day (QID) | RESPIRATORY_TRACT | 1 refills | Status: DC | PRN

## 2019-09-19 MED ORDER — FUROSEMIDE 20 MG PO TABS: 20.0000 mg | ORAL_TABLET | Freq: Every day | ORAL | 2 refills | Status: DC

## 2019-09-19 MED ORDER — FEXOFENADINE HCL 180 MG PO TABS: 180.0000 mg | ORAL_TABLET | Freq: Every day | ORAL | 2 refills | Status: DC

## 2019-09-19 NOTE — Patient Instructions (Signed)
Bronchospasm, Adult  Bronchospasm is when airways in the lungs get smaller. When this happens, it can be hard to breathe. You may cough. You may also make a whistling sound when you breathe (wheeze). Follow these instructions at home: Medicines  Take over-the-counter and prescription medicines only as told by your doctor.  If you need to use an inhaler or nebulizer to take your medicine, ask your doctor how to use it.  If you were given a spacer, always use it with your inhaler. Lifestyle  Change your heating and air conditioning filter. Do this at least once a month.  Try not to use fireplaces and wood stoves.  Do not  smoke. Do not  allow smoking in your home.  Try not to use things that have a strong smell, like perfume.  Get rid of pests (such as roaches and mice) and their poop.  Remove any mold from your home.  Keep your house clean. Get rid of dust.  Use cleaning products that have no smell.  Replace carpet with wood, tile, or vinyl flooring.  Use allergy-proof pillows, mattress covers, and box spring covers.  Wash bed sheets and blankets every week. Use hot water. Dry them in a dryer.  Use blankets that are made of polyester or cotton.  Wash your hands often.  Keep pets out of your bedroom.  When you exercise, try not to breathe in cold air. General instructions  Have a plan for getting medical care. Know these things: ? When to call your doctor. ? When to call local emergency services (911 in the [place] ? Where to go in an emergency.  Stay up to date on your shots (immunizations).  When you have an episode: ? Stay calm. ? Relax. ? Breathe slowly. Contact a doctor if:  Your muscles ache.  Your chest hurts.  The color of the mucus you cough up (sputum) changes from clear or white to yellow, green, gray, or bloody.  The mucus you cough up gets thicker.  You have a fever. Get help right away if:  The whistling sound gets worse, even after you  take your medicines.  Your coughing gets worse.  You find it even harder to breathe.  Your chest hurts very much. Summary  Bronchospasm is when airways in the lungs get smaller.  When this happens, it can be hard to breathe. You may cough. You may also make a whistling sound when you breathe.  Stay away from things that cause you to have episodes. These include smoke or dust. This information is not intended to replace advice given to you by your health care provider. Make sure you discuss any questions you have with your health care provider. Document Revised: 06/02/2017 Document Reviewed: 06/23/2016 Elsevier Patient Education  2020 [employer]  

## 2019-09-19 NOTE — Progress Notes (Deleted)
Subjective:    Patient ID: Julia Mcfarland, female    DOB: 1945/10/04, 74 y.o.   MRN: [id number]  HPI  Pt presents to the clinic today with c/o shortness of breath with exertion.  Review of Systems      Past Medical History:  Diagnosis Date  . Allergy   . Arthritis   . Chicken pox   . Edema   . Heart murmur    YEARS  AGO  NO PROBLEM   . PONV (postoperative nausea and vomiting)     Current Outpatient Medications  Medication Sig Dispense Refill  . Cholecalciferol (VITAMIN D3) 5000 UNITS CAPS Take 5,000 Units by mouth daily.     . furosemide (LASIX) 20 MG tablet TAKE 1 TABLET BY MOUTH EVERY DAY 90 tablet 0  . vitamin B-12 (CYANOCOBALAMIN) 500 MCG tablet Take 500 mcg by mouth daily.     No current facility-administered medications for this visit.    No Known Allergies  Family History  Problem Relation Age of Onset  . Arthritis Mother   . Heart disease Mother   . Hypertension Mother   . Diabetes Mother   . Stroke Father   . Hypertension Father   . Stroke Sister   . Hypertension Sister   . Heart disease Maternal Grandfather   . Diabetes Paternal Grandmother   . Heart disease Paternal Grandfather     Social History   Socioeconomic History  . Marital status: Married    Spouse name: Not on file  . Number of children: Not on file  . Years of education: Not on file  . Highest education level: Not on file  Occupational History  . Not on file  Tobacco Use  . Smoking status: Never Smoker  . Smokeless tobacco: Never Used  Substance and Sexual Activity  . Alcohol use: Yes    Comment: occasional  . Drug use: No  . Sexual activity: Never  Other Topics Concern  . Not on file  Social History Narrative  . Not on file   Social Determinants of Health   Financial Resource Strain:   . Difficulty of Paying Living Expenses:   Food Insecurity:   . Worried About Charity fundraiser in the Last Year:   . Arboriculturist in the Last Year:   Transportation Needs:   .  Film/video editor (Medical):   Marland Kitchen Lack of Transportation (Non-Medical):   Physical Activity:   . Days of Exercise per Week:   . Minutes of Exercise per Session:   Stress:   . Feeling of Stress :   Social Connections:   . Frequency of Communication with Friends and Family:   . Frequency of Social Gatherings with Friends and Family:   . Attends Religious Services:   . Active Member of Clubs or Organizations:   . Attends Archivist Meetings:   Marland Kitchen Marital Status:   Intimate Partner Violence:   . Fear of Current or Ex-Partner:   . Emotionally Abused:   Marland Kitchen Physically Abused:   . Sexually Abused:      Constitutional: Denies fever, malaise, fatigue, headache or abrupt weight changes.  HEENT: Denies eye pain, eye redness, ear pain, ringing in the ears, wax buildup, runny nose, nasal congestion, bloody nose, or sore throat. Respiratory: Pt reports shortness of breath with exertion. Denies difficulty breathing, cough or sputum production.   Cardiovascular: Denies chest pain, chest tightness, palpitations or swelling in the hands or feet.  Gastrointestinal: Denies  abdominal pain, bloating, constipation, diarrhea or blood in the stool.  GU: Denies urgency, frequency, pain with urination, burning sensation, blood in urine, odor or discharge. Musculoskeletal: Denies decrease in range of motion, difficulty with gait, muscle pain or joint pain and swelling.  Skin: Denies redness, rashes, lesions or ulcercations.  Neurological: Denies dizziness, difficulty with memory, difficulty with speech or problems with balance and coordination.  Psych: Denies anxiety, depression, SI/HI.  No other specific complaints in a complete review of systems (except as listed in HPI above).  Objective:   Physical Exam   There were no vitals taken for this visit. Wt Readings from Last 3 Encounters:  02/18/19 282 lb (127.9 kg)  07/27/18 274 lb (124.3 kg)  02/06/18 270 lb (122.5 kg)    General:  Appears her stated age, well developed, well nourished in NAD. Skin: Warm, dry and intact. No rashes, lesions or ulcerations noted. HEENT: Head: normal shape and size; Eyes: sclera white, no icterus, conjunctiva pink, PERRLA and EOMs intact; Ears: Tm's gray and intact, normal light reflex; Nose: mucosa pink and moist, septum midline; Throat/Mouth: Teeth present, mucosa pink and moist, no exudate, lesions or ulcerations noted.  Neck:  Neck supple, trachea midline. No masses, lumps or thyromegaly present.  Cardiovascular: Normal rate and rhythm. S1,S2 noted.  No murmur, rubs or gallops noted. No JVD or BLE edema. No carotid bruits noted. Pulmonary/Chest: Normal effort and positive vesicular breath sounds. No respiratory distress. No wheezes, rales or ronchi noted.  Abdomen: Soft and nontender. Normal bowel sounds. No distention or masses noted. Liver, spleen and kidneys non palpable. Musculoskeletal: Normal range of motion. No signs of joint swelling. No difficulty with gait.  Neurological: Alert and oriented. Cranial nerves II-XII grossly intact. Coordination normal.  Psychiatric: Mood and affect normal. Behavior is normal. Judgment and thought content normal.     BMET    Component Value Date/Time   NA 141 02/18/2019 0951   K 4.0 02/18/2019 0951   CL 106 02/18/2019 0951   CO2 27 02/18/2019 0951   GLUCOSE 128 (H) 02/18/2019 0951   BUN 13 02/18/2019 0951   CREATININE 0.89 02/18/2019 0951   CALCIUM 8.9 02/18/2019 0951   GFRNONAA >60 02/28/2017 1151   GFRAA >60 02/28/2017 1151    Lipid Panel     Component Value Date/Time   CHOL 186 02/18/2019 0951   TRIG 62.0 02/18/2019 0951   HDL 68.90 02/18/2019 0951   CHOLHDL 3 02/18/2019 0951   VLDL 12.4 02/18/2019 0951   LDLCALC 104 (H) 02/18/2019 0951    CBC    Component Value Date/Time   WBC 5.0 02/18/2019 0951   RBC 4.07 02/18/2019 0951   HGB 12.9 02/18/2019 0951   HCT 38.3 02/18/2019 0951   PLT 239.0 02/18/2019 0951   MCV 94.2  02/18/2019 0951   MCH 30.4 02/28/2017 1151   MCHC 33.5 02/18/2019 0951   RDW 13.3 02/18/2019 0951   LYMPHSABS 1.7 08/21/2008 1405   MONOABS 0.7 08/21/2008 1405   EOSABS 0.2 08/21/2008 1405   BASOSABS 0.1 08/21/2008 1405    Hgb A1C Lab Results  Component Value Date   HGBA1C 5.9 02/18/2019           Assessment & Plan:   Webb Silversmith, NP This visit occurred during the SARS-CoV-2 public health emergency.  Safety protocols were in place, including screening questions prior to the visit, additional usage of staff PPE, and extensive cleaning of exam room while observing appropriate contact time as indicated for disinfecting  solutions.

## 2019-09-19 NOTE — Progress Notes (Signed)
Subjective:    Patient ID: Julia Mcfarland, female    DOB: 06-23-1946, 74 y.o.   MRN: [id number]  HPI  Pt presents to the clinic today with c/o post nasal drip, cough and intermittent wheezing. She noticed this 3-4 months ago. She denies difficulty swallowing. The cough is productive of clear mucous. She denies headache, runny nose, nasal congestion, ear pain, loss of taste or smell, shortness of breath or chest pain. She denies fever, chills or body aches. She has not had sick contacts that she is aware of. She has tried Claritin intermittently with some relief. She reports she used to have an inhaler for allergy induced asthma many years ago. She does not smoke.  She also needs a refill of her Furosemide.  Review of Systems  Past Medical History:  Diagnosis Date  . Allergy   . Arthritis   . Chicken pox   . Edema   . Heart murmur    YEARS  AGO  NO PROBLEM   . PONV (postoperative nausea and vomiting)     Current Outpatient Medications  Medication Sig Dispense Refill  . Cholecalciferol (VITAMIN D3) 5000 UNITS CAPS Take 5,000 Units by mouth daily.     . furosemide (LASIX) 20 MG tablet Take 1 tablet (20 mg total) by mouth daily. 90 tablet 2  . vitamin B-12 (CYANOCOBALAMIN) 500 MCG tablet Take 500 mcg by mouth daily.    Marland Kitchen albuterol (VENTOLIN HFA) 108 (90 Base) MCG/ACT inhaler Inhale 2 puffs into the lungs every 6 (six) hours as needed for wheezing or shortness of breath. 8 g 1  . fexofenadine (ALLEGRA ALLERGY) 180 MG tablet Take 1 tablet (180 mg total) by mouth daily. 90 tablet 2   No current facility-administered medications for this visit.    No Known Allergies  Family History  Problem Relation Age of Onset  . Arthritis Mother   . Heart disease Mother   . Hypertension Mother   . Diabetes Mother   . Stroke Father   . Hypertension Father   . Stroke Sister   . Hypertension Sister   . Heart disease Maternal Grandfather   . Diabetes Paternal Grandmother   . Heart disease  Paternal Grandfather     Social History   Socioeconomic History  . Marital status: Married    Spouse name: Not on file  . Number of children: Not on file  . Years of education: Not on file  . Highest education level: Not on file  Occupational History  . Not on file  Tobacco Use  . Smoking status: Never Smoker  . Smokeless tobacco: Never Used  Substance and Sexual Activity  . Alcohol use: Yes    Comment: occasional  . Drug use: No  . Sexual activity: Never  Other Topics Concern  . Not on file  Social History Narrative  . Not on file   Social Determinants of Health   Financial Resource Strain:   . Difficulty of Paying Living Expenses:   Food Insecurity:   . Worried About Charity fundraiser in the Last Year:   . Arboriculturist in the Last Year:   Transportation Needs:   . Film/video editor (Medical):   Marland Kitchen Lack of Transportation (Non-Medical):   Physical Activity:   . Days of Exercise per Week:   . Minutes of Exercise per Session:   Stress:   . Feeling of Stress :   Social Connections:   . Frequency of Communication with Friends  and Family:   . Frequency of Social Gatherings with Friends and Family:   . Attends Religious Services:   . Active Member of Clubs or Organizations:   . Attends Archivist Meetings:   Marland Kitchen Marital Status:   Intimate Partner Violence:   . Fear of Current or Ex-Partner:   . Emotionally Abused:   Marland Kitchen Physically Abused:   . Sexually Abused:      Constitutional: Denies fever, malaise, fatigue, headache or abrupt weight changes.  HEENT: Pt reports post nasal drip. Denies eye pain, eye redness, ear pain, ringing in the ears, wax buildup, runny nose, nasal congestion, bloody nose, or sore throat. Respiratory: Pt reports cough and SOB. Denies difficulty breathing.   Cardiovascular: Pt reports swelling in legs. Denies chest pain, chest tightness, palpitations or swelling in the hands.   No other specific complaints in a complete  review of systems (except as listed in HPI above).     Objective:   Physical Exam  BP 122/84   Pulse 60   Temp 97.9 F (36.6 C) (Temporal)   Wt 291 lb (132 kg)   SpO2 99%   BMI 54.98 kg/m  Wt Readings from Last 3 Encounters:  09/19/19 291 lb (132 kg)  02/18/19 282 lb (127.9 kg)  07/27/18 274 lb (124.3 kg)    General: Appears her stated age, obese, in NAD. HEENT: Head: normal shape and size; Eyes: sclera white, no icterus, conjunctiva pink, PERRLA and EOMs intact; Throat/Mouth: Teeth present, mucosa pink and moist, no exudate, lesions or ulcerations noted.  Neck:  No adenopathy noted. Cardiovascular: Normal rate and rhythm. S1,S2 noted.  No murmur, rubs or gallops noted. 1+ non pitting BLE edema.  Pulmonary/Chest: Normal effort and positive vesicular breath sounds. No respiratory distress. No wheezes, rales or ronchi noted.  Neurological: Alert and oriented.   BMET    Component Value Date/Time   NA 141 02/18/2019 0951   K 4.0 02/18/2019 0951   CL 106 02/18/2019 0951   CO2 27 02/18/2019 0951   GLUCOSE 128 (H) 02/18/2019 0951   BUN 13 02/18/2019 0951   CREATININE 0.89 02/18/2019 0951   CALCIUM 8.9 02/18/2019 0951   GFRNONAA >60 02/28/2017 1151   GFRAA >60 02/28/2017 1151    Lipid Panel     Component Value Date/Time   CHOL 186 02/18/2019 0951   TRIG 62.0 02/18/2019 0951   HDL 68.90 02/18/2019 0951   CHOLHDL 3 02/18/2019 0951   VLDL 12.4 02/18/2019 0951   LDLCALC 104 (H) 02/18/2019 0951    CBC    Component Value Date/Time   WBC 5.0 02/18/2019 0951   RBC 4.07 02/18/2019 0951   HGB 12.9 02/18/2019 0951   HCT 38.3 02/18/2019 0951   PLT 239.0 02/18/2019 0951   MCV 94.2 02/18/2019 0951   MCH 30.4 02/28/2017 1151   MCHC 33.5 02/18/2019 0951   RDW 13.3 02/18/2019 0951   LYMPHSABS 1.7 08/21/2008 1405   MONOABS 0.7 08/21/2008 1405   EOSABS 0.2 08/21/2008 1405   BASOSABS 0.1 08/21/2008 1405    Hgb A1C Lab Results  Component Value Date   HGBA1C 5.9 02/18/2019             Assessment & Plan:  Allergy Induced Asthma:  RX for Fexofenadine 180 mg daily RX for Albuterol 1-2 puffs Q4-6H prn  CVI:  Furosemide refilled today Encouraged elevation   Return precautions discussed Webb Silversmith, NP     This visit occurred during the SARS-CoV-2 public health emergency.  Safety protocols were in place, including screening questions prior to the visit, additional usage of staff PPE, and extensive cleaning of exam room while observing appropriate contact time as indicated for disinfecting solutions.

## 2019-10-28 ENCOUNTER — Inpatient Hospital Stay (HOSPITAL_COMMUNITY)
Admission: EM | Admit: 2019-10-28 | Discharge: 2019-11-14 | DRG: 871 | Disposition: A | Discharge status: Home Health | Payer: Medicare HMO | Attending: Internal Medicine | Admitting: Internal Medicine

## 2019-10-28 ENCOUNTER — Telehealth: Payer: Self-pay | Admitting: Internal Medicine

## 2019-10-28 ENCOUNTER — Encounter (HOSPITAL_COMMUNITY): Payer: Self-pay | Admitting: Nurse Practitioner

## 2019-10-28 ENCOUNTER — Other Ambulatory Visit: Payer: Self-pay

## 2019-10-28 ENCOUNTER — Emergency Department (HOSPITAL_COMMUNITY): Payer: Medicare HMO

## 2019-10-28 DIAGNOSIS — R0602 Shortness of breath: Secondary | ICD-10-CM | POA: Diagnosis not present

## 2019-10-28 DIAGNOSIS — I959 Hypotension, unspecified: Secondary | ICD-10-CM | POA: Diagnosis not present

## 2019-10-28 DIAGNOSIS — R000 Tachycardia, unspecified: Secondary | ICD-10-CM | POA: Diagnosis present

## 2019-10-28 DIAGNOSIS — J9601 Acute respiratory failure with hypoxia: Secondary | ICD-10-CM | POA: Diagnosis not present

## 2019-10-28 DIAGNOSIS — U071 COVID-19: Secondary | ICD-10-CM | POA: Diagnosis not present

## 2019-10-28 DIAGNOSIS — J1282 Pneumonia due to coronavirus disease 2019: Secondary | ICD-10-CM | POA: Diagnosis not present

## 2019-10-28 DIAGNOSIS — I248 Other forms of acute ischemic heart disease: Secondary | ICD-10-CM | POA: Diagnosis present

## 2019-10-28 DIAGNOSIS — I480 Paroxysmal atrial fibrillation: Secondary | ICD-10-CM | POA: Diagnosis not present

## 2019-10-28 DIAGNOSIS — E876 Hypokalemia: Secondary | ICD-10-CM | POA: Diagnosis not present

## 2019-10-28 DIAGNOSIS — E860 Dehydration: Secondary | ICD-10-CM | POA: Diagnosis not present

## 2019-10-28 DIAGNOSIS — Z96653 Presence of artificial knee joint, bilateral: Secondary | ICD-10-CM | POA: Diagnosis present

## 2019-10-28 DIAGNOSIS — I4819 Other persistent atrial fibrillation: Secondary | ICD-10-CM | POA: Insufficient documentation

## 2019-10-28 DIAGNOSIS — A4189 Other specified sepsis: Secondary | ICD-10-CM | POA: Diagnosis not present

## 2019-10-28 DIAGNOSIS — I509 Heart failure, unspecified: Secondary | ICD-10-CM

## 2019-10-28 DIAGNOSIS — N179 Acute kidney failure, unspecified: Secondary | ICD-10-CM | POA: Diagnosis not present

## 2019-10-28 DIAGNOSIS — Z79899 Other long term (current) drug therapy: Secondary | ICD-10-CM

## 2019-10-28 DIAGNOSIS — J45909 Unspecified asthma, uncomplicated: Secondary | ICD-10-CM | POA: Diagnosis present

## 2019-10-28 DIAGNOSIS — R451 Restlessness and agitation: Secondary | ICD-10-CM | POA: Diagnosis not present

## 2019-10-28 DIAGNOSIS — J9600 Acute respiratory failure, unspecified whether with hypoxia or hypercapnia: Secondary | ICD-10-CM | POA: Diagnosis not present

## 2019-10-28 DIAGNOSIS — R5381 Other malaise: Secondary | ICD-10-CM | POA: Diagnosis present

## 2019-10-28 DIAGNOSIS — G9341 Metabolic encephalopathy: Secondary | ICD-10-CM | POA: Diagnosis not present

## 2019-10-28 DIAGNOSIS — Z781 Physical restraint status: Secondary | ICD-10-CM | POA: Diagnosis not present

## 2019-10-28 DIAGNOSIS — R531 Weakness: Secondary | ICD-10-CM | POA: Diagnosis present

## 2019-10-28 DIAGNOSIS — I4891 Unspecified atrial fibrillation: Secondary | ICD-10-CM | POA: Diagnosis not present

## 2019-10-28 DIAGNOSIS — R7303 Prediabetes: Secondary | ICD-10-CM | POA: Diagnosis present

## 2019-10-28 DIAGNOSIS — M1990 Unspecified osteoarthritis, unspecified site: Secondary | ICD-10-CM | POA: Diagnosis present

## 2019-10-28 DIAGNOSIS — Z8249 Family history of ischemic heart disease and other diseases of the circulatory system: Secondary | ICD-10-CM

## 2019-10-28 DIAGNOSIS — R0682 Tachypnea, not elsewhere classified: Secondary | ICD-10-CM | POA: Diagnosis not present

## 2019-10-28 DIAGNOSIS — T380X5A Adverse effect of glucocorticoids and synthetic analogues, initial encounter: Secondary | ICD-10-CM | POA: Diagnosis not present

## 2019-10-28 DIAGNOSIS — E1165 Type 2 diabetes mellitus with hyperglycemia: Secondary | ICD-10-CM | POA: Diagnosis present

## 2019-10-28 DIAGNOSIS — R7402 Elevation of levels of lactic acid dehydrogenase (LDH): Secondary | ICD-10-CM | POA: Diagnosis present

## 2019-10-28 DIAGNOSIS — J982 Interstitial emphysema: Secondary | ICD-10-CM | POA: Diagnosis present

## 2019-10-28 DIAGNOSIS — Z90710 Acquired absence of both cervix and uterus: Secondary | ICD-10-CM

## 2019-10-28 DIAGNOSIS — R7989 Other specified abnormal findings of blood chemistry: Secondary | ICD-10-CM | POA: Diagnosis not present

## 2019-10-28 DIAGNOSIS — R918 Other nonspecific abnormal finding of lung field: Secondary | ICD-10-CM | POA: Diagnosis not present

## 2019-10-28 DIAGNOSIS — I5033 Acute on chronic diastolic (congestive) heart failure: Secondary | ICD-10-CM | POA: Diagnosis present

## 2019-10-28 DIAGNOSIS — I872 Venous insufficiency (chronic) (peripheral): Secondary | ICD-10-CM | POA: Diagnosis present

## 2019-10-28 DIAGNOSIS — R410 Disorientation, unspecified: Secondary | ICD-10-CM | POA: Diagnosis not present

## 2019-10-28 DIAGNOSIS — Z95828 Presence of other vascular implants and grafts: Secondary | ICD-10-CM

## 2019-10-28 DIAGNOSIS — Z823 Family history of stroke: Secondary | ICD-10-CM

## 2019-10-28 DIAGNOSIS — Z6841 Body Mass Index (BMI) 40.0 and over, adult: Secondary | ICD-10-CM

## 2019-10-28 DIAGNOSIS — E66813 Obesity, class 3: Secondary | ICD-10-CM

## 2019-10-28 DIAGNOSIS — Z452 Encounter for adjustment and management of vascular access device: Secondary | ICD-10-CM | POA: Diagnosis not present

## 2019-10-28 DIAGNOSIS — Z8261 Family history of arthritis: Secondary | ICD-10-CM

## 2019-10-28 DIAGNOSIS — R040 Epistaxis: Secondary | ICD-10-CM | POA: Diagnosis not present

## 2019-10-28 DIAGNOSIS — Z833 Family history of diabetes mellitus: Secondary | ICD-10-CM

## 2019-10-28 HISTORY — DX: Unspecified osteoarthritis, unspecified site: M19.90

## 2019-10-28 HISTORY — DX: Venous insufficiency (chronic) (peripheral): I87.2

## 2019-10-28 HISTORY — DX: Morbid (severe) obesity due to excess calories: E66.01

## 2019-10-28 LAB — TSH: TSH: 0.725 u[IU]/mL (ref 0.350–4.500)

## 2019-10-28 LAB — BASIC METABOLIC PANEL
Anion gap: 17 — ABNORMAL HIGH (ref 5–15)
BUN: 24 mg/dL — ABNORMAL HIGH (ref 8–23)
CO2: 25 mmol/L (ref 22–32)
Calcium: 8.6 mg/dL — ABNORMAL LOW (ref 8.9–10.3)
Chloride: 99 mmol/L (ref 98–111)
Creatinine, Ser: 1.65 mg/dL — ABNORMAL HIGH (ref 0.44–1.00)
GFR calc Af Amer: 36 mL/min — ABNORMAL LOW (ref >60)
GFR calc non Af Amer: 31 mL/min — ABNORMAL LOW (ref >60)
Glucose, Bld: 235 mg/dL — ABNORMAL HIGH (ref 70–99)
Potassium: 3.9 mmol/L (ref 3.5–5.1)
Sodium: 141 mmol/L (ref 135–145)

## 2019-10-28 LAB — TROPONIN I (HIGH SENSITIVITY)
Troponin I (High Sensitivity): 43 ng/L — ABNORMAL HIGH (ref <18)
Troponin I (High Sensitivity): 52 ng/L — ABNORMAL HIGH (ref <18)
Troponin I (High Sensitivity): 55 ng/L — ABNORMAL HIGH (ref <18)
Troponin I (High Sensitivity): 59 ng/L — ABNORMAL HIGH (ref <18)

## 2019-10-28 LAB — HEMOGLOBIN A1C
Hgb A1c MFr Bld: 6.3 % — ABNORMAL HIGH (ref 4.8–5.6)
Mean Plasma Glucose: 134.11 mg/dL

## 2019-10-28 LAB — I-STAT CHEM 8, ED
BUN: 28 mg/dL — ABNORMAL HIGH (ref 8–23)
Calcium, Ion: 0.96 mmol/L — ABNORMAL LOW (ref 1.15–1.40)
Chloride: 101 mmol/L (ref 98–111)
Creatinine, Ser: 1.4 mg/dL — ABNORMAL HIGH (ref 0.44–1.00)
Glucose, Bld: 227 mg/dL — ABNORMAL HIGH (ref 70–99)
HCT: 48 % — ABNORMAL HIGH (ref 36.0–46.0)
Hemoglobin: 16.3 g/dL — ABNORMAL HIGH (ref 12.0–15.0)
Potassium: 3.8 mmol/L (ref 3.5–5.1)
Sodium: 140 mmol/L (ref 135–145)
TCO2: 28 mmol/L (ref 22–32)

## 2019-10-28 LAB — CBG MONITORING, ED: Glucose-Capillary: 145 mg/dL — ABNORMAL HIGH (ref 70–99)

## 2019-10-28 LAB — CBC
HCT: 49.5 % — ABNORMAL HIGH (ref 36.0–46.0)
Hemoglobin: 15.7 g/dL — ABNORMAL HIGH (ref 12.0–15.0)
MCH: 29.8 pg (ref 26.0–34.0)
MCHC: 31.7 g/dL (ref 30.0–36.0)
MCV: 94.1 fL (ref 80.0–100.0)
Platelets: 206 10*3/uL (ref 150–400)
RBC: 5.26 MIL/uL — ABNORMAL HIGH (ref 3.87–5.11)
RDW: 12.9 % (ref 11.5–15.5)
WBC: 4 10*3/uL (ref 4.0–10.5)
nRBC: 0 % (ref 0.0–0.2)

## 2019-10-28 LAB — BRAIN NATRIURETIC PEPTIDE: B Natriuretic Peptide: 761.9 pg/mL — ABNORMAL HIGH (ref 0.0–100.0)

## 2019-10-28 MED ORDER — SODIUM CHLORIDE 0.9 % IV BOLUS
1000.0000 mL | Freq: Once | INTRAVENOUS | Status: AC
  Administered 2019-10-28: 1000 mL via INTRAVENOUS

## 2019-10-28 MED ORDER — FUROSEMIDE 10 MG/ML IJ SOLN
20.0000 mg | Freq: Once | INTRAMUSCULAR | Status: AC
  Administered 2019-10-28: 20 mg via INTRAVENOUS
  Filled 2019-10-28: qty 2

## 2019-10-28 MED ORDER — ONDANSETRON HCL 4 MG/2ML IJ SOLN: 4.0000 mg | Freq: Four times a day (QID) | INTRAMUSCULAR | Status: DC | PRN

## 2019-10-28 MED ORDER — AMIODARONE HCL IN DEXTROSE 360-4.14 MG/200ML-% IV SOLN
60.0000 mg/h | INTRAVENOUS | Status: DC
  Administered 2019-10-28: 60 mg/h via INTRAVENOUS
  Filled 2019-10-28 (×2): qty 200

## 2019-10-28 MED ORDER — AMIODARONE LOAD VIA INFUSION
150.0000 mg | Freq: Once | INTRAVENOUS | Status: AC
  Administered 2019-10-28: 150 mg via INTRAVENOUS
  Filled 2019-10-28: qty 83.34

## 2019-10-28 MED ORDER — DILTIAZEM HCL-DEXTROSE 125-5 MG/125ML-% IV SOLN (PREMIX)
5.0000 mg/h | INTRAVENOUS | Status: DC
  Administered 2019-10-28: 5 mg/h via INTRAVENOUS
  Filled 2019-10-28: qty 125

## 2019-10-28 MED ORDER — HEPARIN (PORCINE) 25000 UT/250ML-% IV SOLN
900.0000 [IU]/h | INTRAVENOUS | Status: DC
  Administered 2019-10-28: 1200 [IU]/h via INTRAVENOUS
  Filled 2019-10-28 (×2): qty 250

## 2019-10-28 MED ORDER — ACETAMINOPHEN 325 MG PO TABS: 650.0000 mg | ORAL_TABLET | ORAL | Status: DC | PRN

## 2019-10-28 MED ORDER — INSULIN ASPART 100 UNIT/ML ~~LOC~~ SOLN
0.0000 [IU] | Freq: Three times a day (TID) | SUBCUTANEOUS | Status: DC
  Administered 2019-10-29: 5 [IU] via SUBCUTANEOUS
  Administered 2019-10-29 (×2): 2 [IU] via SUBCUTANEOUS
  Administered 2019-10-30: 5 [IU] via SUBCUTANEOUS
  Administered 2019-10-30: 3 [IU] via SUBCUTANEOUS
  Administered 2019-10-30 – 2019-10-31 (×2): 5 [IU] via SUBCUTANEOUS
  Administered 2019-10-31 (×2): 8 [IU] via SUBCUTANEOUS

## 2019-10-28 MED ORDER — SODIUM CHLORIDE 0.9% FLUSH: 3.0000 mL | Freq: Once | INTRAVENOUS | Status: DC

## 2019-10-28 MED ORDER — AMIODARONE HCL IN DEXTROSE 360-4.14 MG/200ML-% IV SOLN
30.0000 mg/h | INTRAVENOUS | Status: DC
  Administered 2019-10-29: 30 mg/h via INTRAVENOUS
  Filled 2019-10-28: qty 200

## 2019-10-28 MED ORDER — HEPARIN BOLUS VIA INFUSION
4000.0000 [IU] | Freq: Once | INTRAVENOUS | Status: AC
  Administered 2019-10-28: 4000 [IU] via INTRAVENOUS
  Filled 2019-10-28: qty 4000

## 2019-10-28 MED ORDER — DILTIAZEM HCL 25 MG/5ML IV SOLN
5.0000 mg | Freq: Once | INTRAVENOUS | Status: AC
  Administered 2019-10-28: 5 mg via INTRAVENOUS
  Filled 2019-10-28: qty 5

## 2019-10-28 MED ORDER — ALBUTEROL SULFATE HFA 108 (90 BASE) MCG/ACT IN AERS
2.0000 | INHALATION_SPRAY | Freq: Four times a day (QID) | RESPIRATORY_TRACT | Status: DC | PRN
  Filled 2019-10-28: qty 6.7

## 2019-10-28 MED ORDER — LORATADINE 10 MG PO TABS
10.0000 mg | ORAL_TABLET | Freq: Every day | ORAL | Status: DC
  Administered 2019-10-28 – 2019-10-31 (×4): 10 mg via ORAL
  Filled 2019-10-28 (×4): qty 1

## 2019-10-28 NOTE — ED Triage Notes (Signed)
Pt here from home for evaluation of palpitations with exertion since yesterday. Pt feels weak and endorses shortness of breath.

## 2019-10-28 NOTE — Telephone Encounter (Signed)
Pt husband [name] calling requesting an appt with Rollene Fare or recommendations for the patient.  Pt is not eating and is sleeping a lot. He states that she will wake up, move to another chair and go back to sleep. THis has been going on for 1 day. Pt has only been awake for about a total of 1 hour since getting out of bed at 10am, pt normally wakes up about 8am. That is another thing that concerned him was her sleeping later than usual.  Pt is able to carry out a conversation, oriented and husband denies confusion or any other symptoms.   No appts today with Rollene Fare.

## 2019-10-28 NOTE — Progress Notes (Addendum)
History & Physical    Patient ID: Julia Mcfarland MRN: [id number], DOB/AGE: Jun 10, 1946   Admit date: 10/28/2019   Primary Physician: [name], NP Primary Cardiologist: Sanda Klein, MD - new  Patient Profile    74 year old female with a history of chronic venous insufficiency, degenerative joint disease/arthritis status post bilateral knee replacements, seasonal allergies, and morbid obesity, who presented to the ED with a 3-day history of fatigue, dyspnea, and palpitations, and has been found to be in A. fib with RVR.  Past Medical History    Past Medical History:  Diagnosis Date   Allergy    Arthritis    Chicken pox    Chronic venous insufficiency    a. Uses furosemide prn.   Degenerative joint disease    Morbid obesity ([place])    Osteoarthritis    PONV (postoperative nausea and vomiting)     Past Surgical History:  Procedure Laterality Date   ABDOMINAL HYSTERECTOMY  1988   CARPAL TUNNEL RELEASE Right    REPLACEMENT TOTAL KNEE Right 2010   TOTAL KNEE ARTHROPLASTY Left 03/07/2017   Procedure: LEFT TOTAL KNEE ARTHROPLASTY;  Surgeon: Meredith Pel, MD;  Location: [facility];  Service: Orthopedics;  Laterality: Left;     Allergies  No Known Allergies  History of Present Illness    74 year old female with the above past medical history including chronic venous insufficiency on as needed Lasix at home, degenerative joint disease/arthritis status post bilateral knee replacements, seasonal allergies, and morbid obesity.  She has no prior cardiac history.  She lives locally with her husband and notes that she is relatively active without symptoms or limitations, though she does not routinely exercise.  She was in her usual state of health until Saturday, April 24, when she noted tachypalpitations with extreme fatigue and mild dyspnea.  She says she spent much of the weekend in bed feeling very unsteady on her feet when she tried to get up.  She has had very  little appetite and has not been eating or drinking well over the past 3 days.  She has noted some right upper back pain that is worse with certain position changes but does not change with deep breathing or palpation.  She has not had any chest pain and also denies PND, orthopnea, dizziness, syncope, edema, or early satiety.  She has noted occasional chills but has not had any fevers and denies any sick contacts.  Due to ongoing extreme fatigue, dyspnea, and palpitations, her husband brought her to the emergency room today where she was found to be in atrial fibrillation with rapid ventricular response and a rate of 172 bpm.  Blood pressures have been stable.  She is afebrile.  She was just placed on diltiazem infusion.  Rates are currently in the 130s to 140s.  She is lying relatively flat in the stretcher and is in no acute distress currently.  Lab work notable for normal H&H and electrolytes with evidence of acute kidney injury creatinine of 1.65 (was 0.89 in August 2020).  Home Medications    Prior to Admission medications   Medication Sig Start Date End Date Taking? Authorizing Provider  albuterol (VENTOLIN HFA) 108 (90 Base) MCG/ACT inhaler Inhale 2 puffs into the lungs every 6 (six) hours as needed for wheezing or shortness of breath. 09/19/19  Yes [name], NP  Cholecalciferol (VITAMIN D3) 5000 UNITS CAPS Take 5,000 Units by mouth daily.    Yes [provider]  fexofenadine (ALLEGRA ALLERGY)  180 MG tablet Take 1 tablet (180 mg total) by mouth daily. 09/19/19  Yes [name], NP  furosemide (LASIX) 20 MG tablet Take 1 tablet (20 mg total) by mouth daily. 09/19/19  Yes Baity, Coralie Keens, NP  hydroxypropyl methylcellulose / hypromellose (ISOPTO TEARS / GONIOVISC) 2.5 % ophthalmic solution Place 1 drop into both eyes daily as needed for dry eyes.   Yes [provider]  vitamin B-12 (CYANOCOBALAMIN) 500 MCG tablet Take 500 mcg by mouth daily.   Yes [provider]     Family History    Family History  Problem Relation Age of Onset   Arthritis Mother    Heart disease Mother    Hypertension Mother    Diabetes Mother    Stroke Father    Hypertension Father    Stroke Sister    Hypertension Sister    Heart disease Maternal Grandfather    Diabetes Paternal Grandmother    Heart disease Paternal Grandfather    She indicated that her mother is deceased. She indicated that her father is deceased. She indicated that the status of her sister is unknown. She indicated that the status of her maternal grandfather is unknown. She indicated that the status of her paternal grandmother is unknown. She indicated that the status of her paternal grandfather is unknown.   Social History    Social History   Socioeconomic History   Marital status: Married    Spouse name: Not on file   Number of children: Not on file   Years of education: Not on file   Highest education level: Not on file  Occupational History   Not on file  Tobacco Use   Smoking status: Never Smoker   Smokeless tobacco: Never Used  Substance and Sexual Activity   Alcohol use: Yes    Comment: occasional   Drug use: No   Sexual activity: Never  Other Topics Concern   Not on file  Social History Narrative   Lives in [place] w/ husband.  Says she's relatively active but does not routinely exercise.   Social Determinants of Health   Financial Resource Strain:    Difficulty of Paying Living Expenses:   Food Insecurity:    Worried About Charity fundraiser in the Last Year:    Arboriculturist in the Last Year:   Transportation Needs:    Film/video editor (Medical):    Lack of Transportation (Non-Medical):   Physical Activity:    Days of Exercise per Week:    Minutes of Exercise per Session:   Stress:    Feeling of Stress :   Social Connections:    Frequency of Communication with Friends and Family:    Frequency of Social Gatherings with  Friends and Family:    Attends Religious Services:    Active Member of Clubs or Organizations:    Attends Music therapist:    Marital Status:   Intimate Partner Violence:    Fear of Current or Ex-Partner:    Emotionally Abused:    Physically Abused:    Sexually Abused:      Review of Systems    General:  No chills, fever, night sweats or weight changes.  Cardiovascular:  No chest pain, +++ dyspnea on exertion since 4/24, occasional bilat LE edema on prn lasix, no orthopnea, +++ tachypalpitations since 4/24, noparoxysmal nocturnal dyspnea. Dermatological: No rash, lesions/masses Respiratory: No cough,+++dyspnea since 4/24 Urologic: No hematuria, dysuria Abdominal:   Poor appetite  since 4/24 w/ limited PO intake over the weekend.  No nausea, vomiting, diarrhea, bright red blood per rectum, melena, or hematemesis Neurologic:  No visual changes, +++ gen malaise and wkns/fatigue, no changes in mental status. All other systems reviewed and are otherwise negative except as noted above.  Physical Exam    Blood pressure (!) 134/99, pulse (!) 144, temperature 98.5 F (36.9 C), temperature source Oral, resp. rate (!) 48, SpO2 95 %.  General: Pleasant, NAD, obese. Psych: Normal affect. Neuro: Alert and oriented X 3. Moves all extremities spontaneously. HEENT: Normal  Neck: Supple without bruits or JVD. Lungs:  Resp regular and unlabored, bibasilar crackles. Heart: IR, IR, tachycardic, no s3, s4, or murmurs. Abdomen: Obese, soft, non-tender, non-distended, BS + x 4.  Extremities: No clubbing, cyanosis or edema. DP/PT 1+, Radials 2+ and equal bilaterally.  Labs    Cardiac Enzymes Recent Labs  Lab 10/28/19 1511  TROPONINIHS 43*      Lab Results  Component Value Date   WBC 4.0 10/28/2019   HGB 16.3 (H) 10/28/2019   HCT 48.0 (H) 10/28/2019   MCV 94.1 10/28/2019   PLT 206 10/28/2019    Recent Labs  Lab 10/28/19 1511 10/28/19 1511 10/28/19 1535  NA 141    < > 140  K 3.9   < > 3.8  CL 99   < > 101  CO2 25  --   --   BUN 24*   < > 28*  CREATININE 1.65*   < > 1.40*  CALCIUM 8.6*  --   --   GLUCOSE 235*   < > 227*   < > = values in this interval not displayed.   Lab Results  Component Value Date   CHOL 186 02/18/2019   HDL 68.90 02/18/2019   LDLCALC 104 (H) 02/18/2019   TRIG 62.0 02/18/2019    Radiology Studies    DG Chest Portable 1 View  Result Date: 10/28/2019 CLINICAL DATA:  Dyspnea with exertion. EXAM: PORTABLE CHEST 1 VIEW COMPARISON:  February 28, 2017. FINDINGS: Stable cardiomediastinal silhouette. Central pulmonary vascular congestion is noted. Bilateral lung opacities are noted which may represent edema or possibly multifocal pneumonia. Atherosclerosis of thoracic aorta is noted. No pneumothorax or pleural effusion is noted. Bony thorax is unremarkable. IMPRESSION: Aortic atherosclerosis. Central pulmonary vascular congestion is noted. Bilateral lung opacities are noted which may represent edema or possibly multifocal pneumonia. Aortic Atherosclerosis (ICD10-I70.0). Electronically Signed   By: Marijo Conception M.D.   On: 10/28/2019 15:42    ECG & Cardiac Imaging    Atrial fibrillation with rapid ventricular response, 172, ventricular couplet, leftward axis, no acute ST or T changes -  personally reviewed.  Assessment & Plan    1.  Atrial fibrillation with rapid ventricular response: Patient presented to the ED today with a 3-day history of progressive fatigue, malaise, dyspnea, and tachypalpitations that she says clearly started on April 24.  She was found to be in atrial fibrillation with rapid ventricular response and a rate of 172 bpm.  She has been hemodynamically stable.  Lab work notable for normal H&H and electrolytes with evidence of acute kidney injury (creatinine 1.65 on arrival).  She has just been placed on diltiazem infusion and rates have come down to the 130s to 140s.  She is currently asymptomatic and lying flat in  bed.  Chest x-ray suggestive of vascular congestion.  High-sensitivity troponin is mildly elevated at 43.  As pressures have been soft and rates remain  elevated on 15 mg/h of IV diltiazem, we will load with amiodarone.  Add heparin infusion for now with plan to transition to Eliquis 5 mg twice daily (CHA2DS2-VASc equals 2-4 w/ ? CHF & DM) provided that high-sensitivity troponin trend remains flat.  If she does not convert on IV amiodarone, can consider TEE and cardioversion tomorrow or Wednesday vs rate-control and outpt DCCV.  Follow-up TSH and echocardiogram.  She does report a h/o snoring and will need an outpt sleep study.  2.  Acute congestive heart failure: In the setting of above.  She has no prior history of chest pain or heart failure though does carry a diagnosis of chronic venous insufficiency for which she takes Lasix as needed at home.  Chest x-ray with evidence of pulmonary vascular congestion.  No significant volume excess on exam.  She has been given Lasix 20 mg x 1 in the ED.  We will hold off on any further diuresis in the setting of acute kidney injury with a creatinine of 1.65 on arrival.  Blood pressure stable.  Working on rate control as above.  Follow-up echo.  3.  Elevated HsTroponin: High-sensitivity troponin mildly elevated at 43 in the setting of above.  Will trend.  She has not been having any chest pain.  Suspect demand ischemia in the setting of tachycardia.  Depending on trend and echo findings, likely needs inpatient versus outpatient ischemic evaluation.  4.  Acute kidney injury: Creatinine previously 0.89 in August 2020.  She notes that in the past 3 days, she has had poor appetite with limited oral intake.  In that setting, creatinine 1.65 on arrival. She was initially provided a 1000 mL saline bolus and this was followed by a 20 mg Lasix injection in the setting of vascular congestion on chest x-ray.  We will hold off on further IV fluids or diuresis at this time.  Avoid  nephrotoxic agents and follow-up creatinine in the morning.  5.  Hyperglycemia: Glucose 235 on arrival.  She says she has a history of prediabetes but has not been on medication for this.  A1c in August 2020 was 5.9.  We will follow-up hemoglobin A1c and add sliding scale insulin for the time being.  Signed, Murray Hodgkins, NP 10/28/2019, 6:12 PM   I have seen and examined the patient along with Murray Hodgkins, NP.  I have reviewed the chart, notes and new data.  I agree with PA/NP's note.  Key new complaints: dyspnea and fatigue appear to be related to AFib w severe RVR, but she is unaware of palpitations Key examination changes: rapid irregular rhythm, no overt hypervolemia Key new findings / data: Afib w RVR rates 130-140s on maximum dose diltiazem, with SBP in the 90s while supine.  PLAN: Add IV amiodarone, may need TEE guided DCCV if we cannot achieve rate control w conventional AV blocking agents. On IV heparin until we complete evaluation for possible underlying CAD.  Sanda Klein, MD, [place] [phone] 10/28/2019, 9:26 PM

## 2019-10-28 NOTE — ED Provider Notes (Signed)
[facility] EMERGENCY DEPARTMENT Provider Note   CSN: [id number] Arrival date & time: 10/28/19  1425     History Chief Complaint  Patient presents with  . Tachycardia    Julia Mcfarland is a 74 y.o. female still history of heart murmur who presents for evaluation of 3 days of generalized weakness, increased fatigue, shortness of breath.  She reports that over the last 3 days, she has just felt like she has no energy.  He states that this is worse is when she gets up and exerts herself.  She also has some dyspnea on exertion.  She does feel slightly short of breath at rest but does significantly worsen when she gets up and walks around.  No chest pain associated with it.  She states she has maybe noticed some small fluid around her ankles.  No asymmetric swelling.  No overlying warmth, erythema.  She states she has not been sick recently denies any fevers that she measured.  She has had a slight cough but is nonproductive.  No abdominal pain, nausea/vomiting. She denies any OCP use, recent immobilization, prior history of DVT/PE, recent surgery, leg swelling, or long travel.  No personal history of MI.  She does not smoke.  The history is provided by the patient.       Past Medical History:  Diagnosis Date  . Allergy   . Arthritis   . Chicken pox   . Chronic venous insufficiency    a. Uses furosemide prn.  . Degenerative joint disease   . Morbid obesity ([place])   . Osteoarthritis   . PONV (postoperative nausea and vomiting)     Patient Active Problem List   Diagnosis Date Noted  . Persistent atrial fibrillation ([place]) 10/28/2019  . Chronic venous insufficiency 02/18/2019  . Osteoporosis 08/14/2013    Past Surgical History:  Procedure Laterality Date  . ABDOMINAL HYSTERECTOMY  1988  . CARPAL TUNNEL RELEASE Right   . REPLACEMENT TOTAL KNEE Right 2010  . TOTAL KNEE ARTHROPLASTY Left 03/07/2017   Procedure: LEFT TOTAL KNEE ARTHROPLASTY;  Surgeon: Meredith Pel, MD;  Location: [facility];  Service: Orthopedics;  Laterality: Left;     OB History   No obstetric history on file.     Family History  Problem Relation Age of Onset  . Arthritis Mother   . Heart disease Mother   . Hypertension Mother   . Diabetes Mother   . Stroke Father   . Hypertension Father   . Stroke Sister   . Hypertension Sister   . Heart disease Maternal Grandfather   . Diabetes Paternal Grandmother   . Heart disease Paternal Grandfather     Social History   Tobacco Use  . Smoking status: Never Smoker  . Smokeless tobacco: Never Used  Substance Use Topics  . Alcohol use: Yes    Comment: occasional  . Drug use: No    Home Medications Prior to Admission medications   Medication Sig Start Date End Date Taking? Authorizing Provider  albuterol (VENTOLIN HFA) 108 (90 Base) MCG/ACT inhaler Inhale 2 puffs into the lungs every 6 (six) hours as needed for wheezing or shortness of breath. 09/19/19  Yes [name], NP  Cholecalciferol (VITAMIN D3) 5000 UNITS CAPS Take 5,000 Units by mouth daily.    Yes [provider]  fexofenadine (ALLEGRA ALLERGY) 180 MG tablet Take 1 tablet (180 mg total) by mouth daily. 09/19/19  Yes [name], NP  furosemide (LASIX) 20  MG tablet Take 1 tablet (20 mg total) by mouth daily. 09/19/19  Yes Baity, Coralie Keens, NP  hydroxypropyl methylcellulose / hypromellose (ISOPTO TEARS / GONIOVISC) 2.5 % ophthalmic solution Place 1 drop into both eyes daily as needed for dry eyes.   Yes [provider]  vitamin B-12 (CYANOCOBALAMIN) 500 MCG tablet Take 500 mcg by mouth daily.   Yes [provider]    Allergies    Patient has no known allergies.  Review of Systems   Review of Systems  Constitutional: Positive for fatigue. Negative for fever.  Respiratory: Positive for cough and shortness of breath.   Cardiovascular: Negative for chest pain.  Gastrointestinal: Negative for abdominal pain, nausea and vomiting.    Genitourinary: Negative for dysuria and hematuria.  Neurological: Positive for weakness (generalized). Negative for headaches.  All other systems reviewed and are negative.   Physical Exam Updated Vital Signs BP 106/81   Pulse 88   Temp 98.5 F (36.9 C) (Oral)   Resp (!) 28   Wt 122.5 kg Comment: patient reported  SpO2 90%   BMI 51.02 kg/m   Physical Exam Vitals and nursing note reviewed.  Constitutional:      Appearance: Normal appearance. She is well-developed.  HENT:     Head: Normocephalic and atraumatic.  Eyes:     General: Lids are normal.     Conjunctiva/sclera: Conjunctivae normal.     Pupils: Pupils are equal, round, and reactive to light.  Cardiovascular:     Rate and Rhythm: Tachycardia present. Rhythm irregularly irregular.     Pulses: Normal pulses.     Heart sounds: Normal heart sounds. No murmur. No friction rub. No gallop.   Pulmonary:     Effort: Pulmonary effort is normal.     Breath sounds: Normal breath sounds.     Comments: Lungs clear to auscultation bilaterally.  Symmetric chest rise.  No wheezing, rales, rhonchi. Able to speak in full sentences without any difficulty.  Abdominal:     Palpations: Abdomen is soft. Abdomen is not rigid.     Tenderness: There is no abdominal tenderness. There is no guarding.     Comments: Abdomen is soft, non-distended, non-tender. No rigidity, No guarding. No peritoneal signs.  Musculoskeletal:        General: Normal range of motion.     Cervical back: Full passive range of motion without pain.     Comments: Minimal pitting edema noted to bilateral ankles.   Skin:    General: Skin is warm and dry.     Capillary Refill: Capillary refill takes less than 2 seconds.  Neurological:     Mental Status: She is alert and oriented to person, place, and time.  Psychiatric:        Speech: Speech normal.     ED Results / Procedures / Treatments   Labs (all labs ordered are listed, but only abnormal results are  displayed) Labs Reviewed  BASIC METABOLIC PANEL - Abnormal; Notable for the following components:      Result Value   Glucose, Bld 235 (*)    BUN 24 (*)    Creatinine, Ser 1.65 (*)    Calcium 8.6 (*)    GFR calc non Af Amer 31 (*)    GFR calc Af Amer 36 (*)    Anion gap 17 (*)    All other components within normal limits  CBC - Abnormal; Notable for the following components:   RBC 5.26 (*)    Hemoglobin  15.7 (*)    HCT 49.5 (*)    All other components within normal limits  BRAIN NATRIURETIC PEPTIDE - Abnormal; Notable for the following components:   B Natriuretic Peptide 761.9 (*)    All other components within normal limits  HEMOGLOBIN A1C - Abnormal; Notable for the following components:   Hgb A1c MFr Bld 6.3 (*)    All other components within normal limits  I-STAT CHEM 8, ED - Abnormal; Notable for the following components:   BUN 28 (*)    Creatinine, Ser 1.40 (*)    Glucose, Bld 227 (*)    Calcium, Ion 0.96 (*)    Hemoglobin 16.3 (*)    HCT 48.0 (*)    All other components within normal limits  TROPONIN I (HIGH SENSITIVITY) - Abnormal; Notable for the following components:   Troponin I (High Sensitivity) 43 (*)    All other components within normal limits  TROPONIN I (HIGH SENSITIVITY) - Abnormal; Notable for the following components:   Troponin I (High Sensitivity) 52 (*)    All other components within normal limits  TROPONIN I (HIGH SENSITIVITY) - Abnormal; Notable for the following components:   Troponin I (High Sensitivity) 55 (*)    All other components within normal limits  SARS CORONAVIRUS 2 (TAT 6-24 HRS)  TSH  HEMOGLOBIN A1C  LIPID PANEL  CBC  BASIC METABOLIC PANEL  HEPARIN LEVEL (UNFRACTIONATED)    EKG EKG Interpretation  Date/Time:  Monday October 28 2019 14:51:07 EDT Ventricular Rate:  172 PR Interval:    QRS Duration: 80 QT Interval:  266 QTC Calculation: 449 R Axis:   6 Text Interpretation: Atrial fibrillation with rapid ventricular response  with premature ventricular or aberrantly conducted complexes Abnormal ECG Since last tracing rate faster Otherwise no significant change Confirmed by Deno Etienne [id number]) on 10/28/2019 3:16:49 PM   Radiology DG Chest Portable 1 View  Result Date: 10/28/2019 CLINICAL DATA:  Dyspnea with exertion. EXAM: PORTABLE CHEST 1 VIEW COMPARISON:  February 28, 2017. FINDINGS: Stable cardiomediastinal silhouette. Central pulmonary vascular congestion is noted. Bilateral lung opacities are noted which may represent edema or possibly multifocal pneumonia. Atherosclerosis of thoracic aorta is noted. No pneumothorax or pleural effusion is noted. Bony thorax is unremarkable. IMPRESSION: Aortic atherosclerosis. Central pulmonary vascular congestion is noted. Bilateral lung opacities are noted which may represent edema or possibly multifocal pneumonia. Aortic Atherosclerosis (ICD10-I70.0). Electronically Signed   By: Marijo Conception M.D.   On: 10/28/2019 15:42    Procedures .Critical Care Performed by: Volanda Napoleon, PA-C Authorized by: Volanda Napoleon, PA-C   Critical care provider statement:    Critical care time (minutes):  35   Critical care was time spent personally by me on the following activities:  Discussions with consultants, evaluation of patient's response to treatment, examination of patient, ordering and performing treatments and interventions, ordering and review of laboratory studies, ordering and review of radiographic studies, pulse oximetry, re-evaluation of patient's condition, obtaining history from patient or surrogate and review of old charts   (including critical care time)  Medications Ordered in ED Medications  sodium chloride flush (NS) 0.9 % injection 3 mL (3 mLs Intravenous Not Given 10/28/19 1610)  albuterol (VENTOLIN HFA) 108 (90 Base) MCG/ACT inhaler 2 puff (has no administration in time range)  loratadine (CLARITIN) tablet 10 mg (10 mg Oral Given 10/28/19 1931)  acetaminophen  (TYLENOL) tablet 650 mg (has no administration in time range)  ondansetron (ZOFRAN) injection 4 mg (has no administration  in time range)  insulin aspart (novoLOG) injection 0-15 Units (has no administration in time range)  amiodarone (NEXTERONE) 1.8 mg/mL load via infusion 150 mg (150 mg Intravenous Bolus from Bag 10/28/19 1923)    Followed by  amiodarone (NEXTERONE PREMIX) 360-4.14 MG/200ML-% (1.8 mg/mL) IV infusion (60 mg/hr Intravenous New Bag/Given 10/28/19 1922)    Followed by  amiodarone (NEXTERONE PREMIX) 360-4.14 MG/200ML-% (1.8 mg/mL) IV infusion (has no administration in time range)  heparin ADULT infusion 100 units/mL (25000 units/237mL sodium chloride 0.45%) (1,200 Units/hr Intravenous New Bag/Given 10/28/19 1923)  sodium chloride 0.9 % bolus 1,000 mL (0 mLs Intravenous Stopped 10/28/19 1713)  diltiazem (CARDIZEM) injection 5 mg (5 mg Intravenous Given 10/28/19 1549)  furosemide (LASIX) injection 20 mg (20 mg Intravenous Given 10/28/19 1737)  heparin bolus via infusion 4,000 Units (4,000 Units Intravenous Bolus from Bag 10/28/19 1924)    ED Course  I have reviewed the triage vital signs and the nursing notes.  Pertinent labs & imaging results that were available during my care of the patient were reviewed by me and considered in my medical decision making (see chart for details).    MDM Rules/Calculators/A&P                      74 y.o. F who presents for evaluation of 3 days of generalized weakness, dyspnea on exertion, fatigue.  No measured fevers.  Has had slight cough but otherwise no infectious symptoms.  No PE risk factors.  Initially arrival, she is afebrile, nontoxic-appearing.  She is tachycardic with irregularly irregular rhythm.  Her blood pressure is on the soft side with systolic blood pressure ranging between 104-122.  Concern for A. fib with RVR.  No evidence of respiratory distress.  She is able to speak in full sentences without any difficulty.  She does have some very  minimal nonpitting edema noted ankles but otherwise does not appear fluid overloaded.  I reviewed her history, she does not have any previous history of A. Fib.  This appears to be new onset.  Unclear etiology.  Given that she has been in it for about 3 days, she is not a candidate for cardioversion.  We will plan to increase her blood pressure with fluids and attempt to slow her heart rate down.  Trop is 43. BMP shows BUN of 24 and Cr 1.65. Anion gap of 17. CBC shows no leukocytosis. Hgb is 15.7.   CXR shows pulmonary vascular congestion. Additionally she has bilateral opacities which may represent edema or multifocal pneumonia.   RN informing that patient had some O2 sats that dropped into the high 80s.  Patient with some tachypnea.  This was after fluid bolus.  Suspect that this may be fluid related.  Patient given a small dose of Lasix.  Patient still with significant tachycardia.  Drip initiated.  Discussed with cardiology.  They will plan to evaluate patient in the ED.   Discussed with cardiology.  They will plan to admit the patient.  Portions of this note were generated with [employer]. Dictation errors may occur despite best attempts at proofreading.   Final Clinical Impression(s) / ED Diagnoses Final diagnoses:  Atrial fibrillation with RVR [facility])    Rx / DC Orders ED Discharge Orders    None       Desma Mcgregor 10/28/19 2008    Floyd, Dan, DO 10/28/19 2010

## 2019-10-28 NOTE — Progress Notes (Signed)
ANTICOAGULATION CONSULT NOTE - Initial Consult  Pharmacy Consult for heparin Indication: atrial fibrillation  No Known Allergies  Patient Measurements: Actual body weight: 122.5 kg (patient reported) Ideal body weight: 50 kg  Heparin Dosing Weight: 81 kg   Vital Signs: Temp: 98.5 F (36.9 C) (04/26 1458) Temp Source: Oral (04/26 1458) BP: 134/99 (04/26 1710) Pulse Rate: 144 (04/26 1710)  Labs: Recent Labs    10/28/19 1511 10/28/19 1535 10/28/19 1727  HGB 15.7* 16.3*  --   HCT 49.5* 48.0*  --   PLT 206  --   --   CREATININE 1.65* 1.40*  --   TROPONINIHS 43*  --  52*    CrCl cannot be calculated (Unknown ideal weight.).   Medical History: Past Medical History:  Diagnosis Date  . Allergy   . Arthritis   . Chicken pox   . Chronic venous insufficiency    a. Uses furosemide prn.  . Degenerative joint disease   . Morbid obesity ([place])   . Osteoarthritis   . PONV (postoperative nausea and vomiting)     Medications:  (Not in a hospital admission)   Assessment: 71 YOF with new onset Afib to start IV heparin in preparation for cardioversion. H/H high. Plt wnl. SCr 1.4.   Goal of Therapy:  Heparin level 0.3-0.7 units/ml Monitor platelets by anticoagulation protocol: Yes   Plan:  -Heparin 4000 units IV bolus followed by heparin infusion at 1200 units/hr  -F/u 8 hr HL -Monitor daily HL, CBC and s/s of bleeding  Albertina Parr, PharmD., BCPS, BCCCP Clinical Pharmacist Clinical phone for 10/28/19 until 11pm: [phone] If after 11pm, please refer to [facility] for unit-specific pharmacist

## 2019-10-28 NOTE — Telephone Encounter (Signed)
FYI  Called patient's Husband to schedule appointment .  He stated that the patient has decided she just wants to go to the hospital today to be evaluated. They will be going to [facility].

## 2019-10-28 NOTE — Telephone Encounter (Signed)
Should schedule 30 min appt for evaluation

## 2019-10-29 ENCOUNTER — Inpatient Hospital Stay (HOSPITAL_COMMUNITY): Payer: Medicare HMO

## 2019-10-29 DIAGNOSIS — J9601 Acute respiratory failure with hypoxia: Secondary | ICD-10-CM | POA: Diagnosis present

## 2019-10-29 DIAGNOSIS — I480 Paroxysmal atrial fibrillation: Secondary | ICD-10-CM | POA: Diagnosis not present

## 2019-10-29 DIAGNOSIS — I4891 Unspecified atrial fibrillation: Secondary | ICD-10-CM | POA: Diagnosis not present

## 2019-10-29 DIAGNOSIS — R7303 Prediabetes: Secondary | ICD-10-CM

## 2019-10-29 DIAGNOSIS — U071 COVID-19: Secondary | ICD-10-CM

## 2019-10-29 DIAGNOSIS — N179 Acute kidney failure, unspecified: Secondary | ICD-10-CM | POA: Diagnosis present

## 2019-10-29 DIAGNOSIS — J1282 Pneumonia due to coronavirus disease 2019: Secondary | ICD-10-CM | POA: Diagnosis present

## 2019-10-29 DIAGNOSIS — Z6841 Body Mass Index (BMI) 40.0 and over, adult: Secondary | ICD-10-CM

## 2019-10-29 LAB — FERRITIN: Ferritin: 1387 ng/mL — ABNORMAL HIGH (ref 11–307)

## 2019-10-29 LAB — BASIC METABOLIC PANEL
Anion gap: 12 (ref 5–15)
BUN: 22 mg/dL (ref 8–23)
CO2: 27 mmol/L (ref 22–32)
Calcium: 8 mg/dL — ABNORMAL LOW (ref 8.9–10.3)
Chloride: 103 mmol/L (ref 98–111)
Creatinine, Ser: 1.16 mg/dL — ABNORMAL HIGH (ref 0.44–1.00)
GFR calc Af Amer: 54 mL/min — ABNORMAL LOW (ref >60)
GFR calc non Af Amer: 47 mL/min — ABNORMAL LOW (ref >60)
Glucose, Bld: 170 mg/dL — ABNORMAL HIGH (ref 70–99)
Potassium: 3.6 mmol/L (ref 3.5–5.1)
Sodium: 142 mmol/L (ref 135–145)

## 2019-10-29 LAB — CBC
HCT: 46.1 % — ABNORMAL HIGH (ref 36.0–46.0)
Hemoglobin: 14.8 g/dL (ref 12.0–15.0)
MCH: 30.2 pg (ref 26.0–34.0)
MCHC: 32.1 g/dL (ref 30.0–36.0)
MCV: 94.1 fL (ref 80.0–100.0)
Platelets: 206 10*3/uL (ref 150–400)
RBC: 4.9 MIL/uL (ref 3.87–5.11)
RDW: 12.9 % (ref 11.5–15.5)
WBC: 3.9 10*3/uL — ABNORMAL LOW (ref 4.0–10.5)
nRBC: 0 % (ref 0.0–0.2)

## 2019-10-29 LAB — HEPATIC FUNCTION PANEL
ALT: 33 U/L (ref 0–44)
AST: 52 U/L — ABNORMAL HIGH (ref 15–41)
Albumin: 2.6 g/dL — ABNORMAL LOW (ref 3.5–5.0)
Alkaline Phosphatase: 41 U/L (ref 38–126)
Bilirubin, Direct: 0.3 mg/dL — ABNORMAL HIGH (ref 0.0–0.2)
Indirect Bilirubin: 0.7 mg/dL (ref 0.3–0.9)
Total Bilirubin: 1 mg/dL (ref 0.3–1.2)
Total Protein: 6.1 g/dL — ABNORMAL LOW (ref 6.5–8.1)

## 2019-10-29 LAB — HEPARIN LEVEL (UNFRACTIONATED)
Heparin Unfractionated: 0.72 IU/mL — ABNORMAL HIGH (ref 0.30–0.70)
Heparin Unfractionated: 0.85 IU/mL — ABNORMAL HIGH (ref 0.30–0.70)

## 2019-10-29 LAB — GLUCOSE, CAPILLARY
Glucose-Capillary: 207 mg/dL — ABNORMAL HIGH (ref 70–99)
Glucose-Capillary: 187 mg/dL — ABNORMAL HIGH (ref 70–99)

## 2019-10-29 LAB — C-REACTIVE PROTEIN: CRP: 12 mg/dL — ABNORMAL HIGH (ref <1.0)

## 2019-10-29 LAB — ECHOCARDIOGRAM COMPLETE: Weight: 4320 oz

## 2019-10-29 LAB — CBG MONITORING, ED
Glucose-Capillary: 144 mg/dL — ABNORMAL HIGH (ref 70–99)
Glucose-Capillary: 148 mg/dL — ABNORMAL HIGH (ref 70–99)

## 2019-10-29 LAB — LIPID PANEL
Cholesterol: 133 mg/dL (ref 0–200)
HDL: 26 mg/dL — ABNORMAL LOW (ref >40)
LDL Cholesterol: 90 mg/dL (ref 0–99)
Total CHOL/HDL Ratio: 5.1 RATIO
Triglycerides: 84 mg/dL (ref <150)
VLDL: 17 mg/dL (ref 0–40)

## 2019-10-29 LAB — HEPATITIS B SURFACE ANTIGEN: Hepatitis B Surface Ag: NONREACTIVE

## 2019-10-29 LAB — PROCALCITONIN: Procalcitonin: <0.1 ng/mL

## 2019-10-29 LAB — LACTATE DEHYDROGENASE: LDH: 497 U/L — ABNORMAL HIGH (ref 98–192)

## 2019-10-29 LAB — SARS CORONAVIRUS 2 (TAT 6-24 HRS): SARS Coronavirus 2: POSITIVE — AB

## 2019-10-29 LAB — D-DIMER, QUANTITATIVE: D-Dimer, Quant: 0.84 ug/mL-FEU — ABNORMAL HIGH (ref 0.00–0.50)

## 2019-10-29 LAB — FIBRINOGEN: Fibrinogen: 654 mg/dL — ABNORMAL HIGH (ref 210–475)

## 2019-10-29 MED ORDER — AMIODARONE HCL IN DEXTROSE 360-4.14 MG/200ML-% IV SOLN
60.0000 mg/h | INTRAVENOUS | Status: DC
  Administered 2019-10-29: 60 mg/h via INTRAVENOUS

## 2019-10-29 MED ORDER — DIGOXIN 125 MCG PO TABS
0.1250 mg | ORAL_TABLET | Freq: Every day | ORAL | Status: DC
  Administered 2019-10-30 – 2019-10-31 (×2): 0.125 mg via ORAL
  Filled 2019-10-29 (×4): qty 1

## 2019-10-29 MED ORDER — SODIUM CHLORIDE 0.9 % IV SOLN
100.0000 mg | Freq: Every day | INTRAVENOUS | Status: AC
  Administered 2019-10-30 – 2019-11-02 (×4): 100 mg via INTRAVENOUS
  Filled 2019-10-29 (×4): qty 20

## 2019-10-29 MED ORDER — DIGOXIN 125 MCG PO TABS
0.2500 mg | ORAL_TABLET | ORAL | Status: AC
  Administered 2019-10-29 (×3): 0.25 mg via ORAL
  Filled 2019-10-29 (×2): qty 2

## 2019-10-29 MED ORDER — SODIUM CHLORIDE 0.9 % IV SOLN
200.0000 mg | Freq: Once | INTRAVENOUS | Status: AC
  Administered 2019-10-29: 200 mg via INTRAVENOUS
  Filled 2019-10-29: qty 40

## 2019-10-29 MED ORDER — DEXAMETHASONE SODIUM PHOSPHATE 10 MG/ML IJ SOLN
6.0000 mg | INTRAMUSCULAR | Status: DC
  Administered 2019-10-29: 6 mg via INTRAVENOUS
  Filled 2019-10-29: qty 1

## 2019-10-29 MED ORDER — METOPROLOL TARTRATE 12.5 MG HALF TABLET
12.5000 mg | ORAL_TABLET | Freq: Two times a day (BID) | ORAL | Status: DC
  Administered 2019-10-29 – 2019-10-30 (×3): 12.5 mg via ORAL
  Filled 2019-10-29 (×3): qty 1

## 2019-10-29 MED ORDER — ALBUTEROL SULFATE (2.5 MG/3ML) 0.083% IN NEBU: 2.5000 mg | INHALATION_SOLUTION | Freq: Four times a day (QID) | RESPIRATORY_TRACT | Status: DC | PRN

## 2019-10-29 MED ORDER — ALBUTEROL SULFATE HFA 108 (90 BASE) MCG/ACT IN AERS
2.0000 | INHALATION_SPRAY | Freq: Four times a day (QID) | RESPIRATORY_TRACT | Status: DC
  Administered 2019-10-29 – 2019-10-31 (×9): 2 via RESPIRATORY_TRACT
  Filled 2019-10-29: qty 6.7

## 2019-10-29 MED ORDER — FAMOTIDINE 20 MG PO TABS
20.0000 mg | ORAL_TABLET | Freq: Two times a day (BID) | ORAL | Status: DC
  Administered 2019-10-29 – 2019-10-31 (×5): 20 mg via ORAL
  Filled 2019-10-29 (×5): qty 1

## 2019-10-29 MED ORDER — AMIODARONE HCL IN DEXTROSE 360-4.14 MG/200ML-% IV SOLN
30.0000 mg/h | INTRAVENOUS | Status: DC
  Administered 2019-10-29 – 2019-11-01 (×6): 30 mg/h via INTRAVENOUS
  Filled 2019-10-29 (×7): qty 200

## 2019-10-29 NOTE — ED Notes (Signed)
Updated Dr. Sallyanne Kuster about pt's sustained Afib 140-150's and soft pressures, orders to follow

## 2019-10-29 NOTE — ED Notes (Signed)
Updated Dr. Sallyanne Kuster of pt's sustained Afib 140-150's and soft pressures, he will order continued Amio gtt

## 2019-10-29 NOTE — ED Notes (Signed)
Lunch Tray Ordered @1144. 

## 2019-10-29 NOTE — Progress Notes (Signed)
Progress Note  Patient Name: Julia Mcfarland Date of Encounter: 10/29/2019  Primary Cardiologist: Sanda Klein, MD   Subjective   Dyspneic and has pleuritic discomfort when she speaks or coughs. AFib very challenging to rate control: on IV amio (rate increased for another 6 hours) and added dig. BP remained soft most of the day, but has improved. Remains hypoxic. Started on remdesivir/dexamethasone. Elevated LDH, CRP, fibrinogen and ferritin. BNP high, but troponin very mildly elevated. Normal echo.  Inpatient Medications    Scheduled Meds:  albuterol  2 puff Inhalation Q6H   dexamethasone (DECADRON) injection  6 mg Intravenous Q24H   [START ON 10/30/2019] digoxin  0.125 mg Oral Daily   digoxin  0.25 mg Oral Q2H   famotidine  20 mg Oral BID   insulin aspart  0-15 Units Subcutaneous TID WC   loratadine  10 mg Oral Daily   sodium chloride flush  3 mL Intravenous Once   Continuous Infusions:  amiodarone     heparin 1,100 Units/hr (10/29/19 0541)   [START ON 10/30/2019] remdesivir 100 mg in NS 100 mL     PRN Meds: acetaminophen, albuterol, ondansetron (ZOFRAN) IV   Vital Signs    Vitals:   10/29/19 1045 10/29/19 1200 10/29/19 1230 10/29/19 1410  BP:  95/74 98/69 (!) 110/93  Pulse: 66     Resp: (!) 36     Temp:      TempSrc:      SpO2: 94% (!) 89%    Weight:        Intake/Output Summary (Last 24 hours) at 10/29/2019 1459 Last data filed at 10/29/2019 1236 Gross per 24 hour  Intake 1150 ml  Output 400 ml  Net 750 ml   Last 3 Weights 10/28/2019 09/19/2019 02/18/2019  Weight (lbs) 270 lb 291 lb 282 lb  Weight (kg) 122.471 kg 131.997 kg 127.914 kg      Telemetry    AFib w RVR - Personally Reviewed  ECG    No new tracing - Personally Reviewed  Physical Exam  Obese, looks uncomfortable GEN: No acute distress.   Neck: unable to see JVD Cardiac: irregular, no murmurs, rubs, or gallops.  Respiratory: Clear to auscultation bilaterally. GI: Soft,  nontender, non-distended  MS: No edema; No deformity. Neuro:  Nonfocal  Psych: Normal affect   Labs    High Sensitivity Troponin:   Recent Labs  Lab 10/28/19 1511 10/28/19 1727 10/28/19 1906 10/28/19 2157  TROPONINIHS 43* 52* 55* 59*      Chemistry Recent Labs  Lab 10/28/19 1511 10/28/19 1535 10/29/19 0450 10/29/19 0956  NA 141 140 142  --   K 3.9 3.8 3.6  --   CL 99 101 103  --   CO2 25  --  27  --   GLUCOSE 235* 227* 170*  --   BUN 24* 28* 22  --   CREATININE 1.65* 1.40* 1.16*  --   CALCIUM 8.6*  --  8.0*  --   PROT  --   --   --  6.1*  ALBUMIN  --   --   --  2.6*  AST  --   --   --  52*  ALT  --   --   --  33  ALKPHOS  --   --   --  41  BILITOT  --   --   --  1.0  GFRNONAA 31*  --  47*  --   GFRAA 36*  --  54*  --   ANIONGAP 17*  --  12  --      Hematology Recent Labs  Lab 10/28/19 1511 10/28/19 1535 10/29/19 0450  WBC 4.0  --  3.9*  RBC 5.26*  --  4.90  HGB 15.7* 16.3* 14.8  HCT 49.5* 48.0* 46.1*  MCV 94.1  --  94.1  MCH 29.8  --  30.2  MCHC 31.7  --  32.1  RDW 12.9  --  12.9  PLT 206  --  206    BNP Recent Labs  Lab 10/28/19 1515  BNP 761.9*     DDimer  Recent Labs  Lab 10/29/19 0956  DDIMER 0.84*     Radiology    DG Chest Portable 1 View  Result Date: 10/28/2019 CLINICAL DATA:  Dyspnea with exertion. EXAM: PORTABLE CHEST 1 VIEW COMPARISON:  February 28, 2017. FINDINGS: Stable cardiomediastinal silhouette. Central pulmonary vascular congestion is noted. Bilateral lung opacities are noted which may represent edema or possibly multifocal pneumonia. Atherosclerosis of thoracic aorta is noted. No pneumothorax or pleural effusion is noted. Bony thorax is unremarkable. IMPRESSION: Aortic atherosclerosis. Central pulmonary vascular congestion is noted. Bilateral lung opacities are noted which may represent edema or possibly multifocal pneumonia. Aortic Atherosclerosis (ICD10-I70.0). Electronically Signed   By: Marijo Conception M.D.   On:  10/28/2019 15:42   ECHOCARDIOGRAM COMPLETE  Result Date: 10/29/2019    ECHOCARDIOGRAM REPORT   Patient Name:   Julia Mcfarland Date of Exam: 10/29/2019 Medical Rec #:  [id number]      Height:       61.0 in Accession #:    [id number]     Weight:       270.0 lb Date of Birth:  Jan 23, 1946      BSA:          2.146 m Patient Age:    74 years       BP:           136/73 mmHg Patient Gender: F              HR:           135 bpm. Exam Location:  Inpatient Procedure: 2D Echo, Cardiac Doppler and Color Doppler Indications:    I48.0 Paroxysmal atrial fibrillation  History:        Patient has no prior history of Echocardiogram examinations.                 COVID-19 Positive.  Sonographer:    Jonelle Sidle Dance Referring Phys: Gerald  1. Normal LV function; mild LAE.  2. Left ventricular ejection fraction, by estimation, is 55 to 60%. The left ventricle has normal function. The left ventricle has no regional wall motion abnormalities. Left ventricular diastolic function could not be evaluated.  3. Right ventricular systolic function is normal. The right ventricular size is normal. Tricuspid regurgitation signal is inadequate for assessing PA pressure.  4. Left atrial size was mildly dilated.  5. The mitral valve is normal in structure. Trivial mitral valve regurgitation. No evidence of mitral stenosis.  6. The aortic valve is tricuspid. Aortic valve regurgitation is not visualized. No aortic stenosis is present.  7. The inferior vena cava is normal in size with greater than 50% respiratory variability, suggesting right atrial pressure of 3 mmHg. FINDINGS  Left Ventricle: Left ventricular ejection fraction, by estimation, is 55 to 60%. The left ventricle has normal function. The left ventricle has no regional wall motion  abnormalities. The left ventricular internal cavity size was normal in size. There is  no left ventricular hypertrophy. Left ventricular diastolic function could not be evaluated due  to atrial fibrillation. Left ventricular diastolic function could not be evaluated. Right Ventricle: The right ventricular size is normal. Right ventricular systolic function is normal. Tricuspid regurgitation signal is inadequate for assessing PA pressure. Left Atrium: Left atrial size was mildly dilated. Right Atrium: Right atrial size was normal in size. Pericardium: There is no evidence of pericardial effusion. Mitral Valve: The mitral valve is normal in structure. Normal mobility of the mitral valve leaflets. Trivial mitral valve regurgitation. No evidence of mitral valve stenosis. Tricuspid Valve: The tricuspid valve is normal in structure. Tricuspid valve regurgitation is mild . No evidence of tricuspid stenosis. Aortic Valve: The aortic valve is tricuspid. Aortic valve regurgitation is not visualized. No aortic stenosis is present. Pulmonic Valve: The pulmonic valve was not well visualized. Pulmonic valve regurgitation is trivial. No evidence of pulmonic stenosis. Aorta: The aortic root is normal in size and structure. Venous: The inferior vena cava is normal in size with greater than 50% respiratory variability, suggesting right atrial pressure of 3 mmHg.  Additional Comments: Normal LV function; mild LAE.  LEFT VENTRICLE PLAX 2D LVIDd:         4.00 cm LVIDs:         2.85 cm LV PW:         1.10 cm LV IVS:        1.13 cm LVOT diam:     1.85 cm LV SV:         40 LV SV Index:   19 LVOT Area:     2.69 cm  RIGHT VENTRICLE             IVC RV Basal diam:  2.82 cm     IVC diam: 1.67 cm RV S prime:     12.20 cm/s TAPSE (M-mode): 0.9 cm LEFT ATRIUM           Index       RIGHT ATRIUM           Index LA diam:      3.20 cm 1.49 cm/m  RA Area:     15.80 cm LA Vol (A2C): 74.0 ml 34.49 ml/m RA Volume:   39.80 ml  18.55 ml/m LA Vol (A4C): 61.6 ml 28.71 ml/m  AORTIC VALVE LVOT Vmax:   122.50 cm/s LVOT Vmean:  66.600 cm/s LVOT VTI:    0.150 m  AORTA Ao Root diam: 3.30 cm Ao Asc diam:  3.20 cm MITRAL VALVE MV Area (PHT):  3.62 cm    SHUNTS MV Decel Time: 210 msec    Systemic VTI:  0.15 m MV E velocity: 53.35 cm/s  Systemic Diam: 1.85 cm Kirk Ruths MD Electronically signed by Kirk Ruths MD Signature Date/Time: 10/29/2019/2:08:19 PM    Final     Cardiac Studies   ECHO today 1. Normal LV function; mild LAE.  2. Left ventricular ejection fraction, by estimation, is 55 to 60%. The  left ventricle has normal function. The left ventricle has no regional  wall motion abnormalities. Left ventricular diastolic function could not  be evaluated.  3. Right ventricular systolic function is normal. The right ventricular  size is normal. Tricuspid regurgitation signal is inadequate for assessing  PA pressure.  4. Left atrial size was mildly dilated.  5. The mitral valve is normal in structure. Trivial mitral valve  regurgitation. No evidence  of mitral stenosis.  6. The aortic valve is tricuspid. Aortic valve regurgitation is not  visualized. No aortic stenosis is present.  7. The inferior vena cava is normal in size with greater than 50%  respiratory variability, suggesting right atrial pressure of 3 mmHg.   Patient Profile     74 y.o. female with new onset AFib w RVR in the setting of COVID-19 pneumonia w hypoxic respiratory failure  Assessment & Plan    1.AFIib: no structural heart abnormalities. Appears related to the viral pneumonia. On IV amio and oral dig for rate control. BP a little higher so will add a low dose of beta blocker. On IV heparin. CHADSVasc 2-3 (age, gender, prediabetes).  For questions or updates, please contact [place] Please consult [email or web address] for contact info under        Signed, Sanda Klein, MD  10/29/2019, 2:59 PM

## 2019-10-29 NOTE — Progress Notes (Signed)
[place] for heparin Indication: atrial fibrillation  No Known Allergies  Patient Measurements: Actual body weight: 122.5 kg (patient reported) Ideal body weight: 50 kg  Heparin Dosing Weight: 81 kg   Vital Signs: BP: 108/78 (04/27 1430) Pulse Rate: 66 (04/27 1045)  Labs: Recent Labs    10/28/19 1511 10/28/19 1511 10/28/19 1535 10/28/19 1727 10/28/19 1906 10/28/19 2157 10/29/19 0450 10/29/19 1552  HGB 15.7*   < > 16.3*  --   --   --  14.8  --   HCT 49.5*  --  48.0*  --   --   --  46.1*  --   PLT 206  --   --   --   --   --  206  --   HEPARINUNFRC  --   --   --   --   --   --  0.72* 0.85*  CREATININE 1.65*  --  1.40*  --   --   --  1.16*  --   TROPONINIHS 43*   < >  --  52* 55* 59*  --   --    < > = values in this interval not displayed.    CrCl cannot be calculated (Unknown ideal weight.).  Assessment: 74 y.o. female with Afib for heparin.   Heparin level came back supratherapeutic again this PM. We will reduce the rate again and check another heparin level.   Goal of Therapy:  Heparin level 0.3-0.7 units/ml Monitor platelets by anticoagulation protocol: Yes   Plan:  Decrease Heparin 900 units/hr Check 6 hr heparin level  Onnie Boer, PharmD, [place], AAHIVP, CPP Infectious Disease Pharmacist 10/29/2019 4:31 PM

## 2019-10-29 NOTE — Progress Notes (Signed)
  Echocardiogram 2D Echocardiogram has been performed.  Illyana Schorsch G Melisssa Donner 10/29/2019, 11:40 AM

## 2019-10-29 NOTE — ED Notes (Signed)
Checked patient cbg it was 12 notified RN of blood sugar patient is resting with call bell in reach

## 2019-10-29 NOTE — Progress Notes (Signed)
[place] for heparin Indication: atrial fibrillation  No Known Allergies  Patient Measurements: Actual body weight: 122.5 kg (patient reported) Ideal body weight: 50 kg  Heparin Dosing Weight: 81 kg   Vital Signs: BP: 112/90 (04/27 0500) Pulse Rate: 91 (04/27 0500)  Labs: Recent Labs    10/28/19 1511 10/28/19 1511 10/28/19 1535 10/28/19 1727 10/28/19 1906 10/28/19 2157 10/29/19 0450  HGB 15.7*   < > 16.3*  --   --   --  14.8  HCT 49.5*  --  48.0*  --   --   --  46.1*  PLT 206  --   --   --   --   --  206  HEPARINUNFRC  --   --   --   --   --   --  0.72*  CREATININE 1.65*  --  1.40*  --   --   --   --   TROPONINIHS 43*   < >  --  52* 55* 59*  --    < > = values in this interval not displayed.    CrCl cannot be calculated (Unknown ideal weight.).  Assessment: 74 y.o. female with Afib for heparin Goal of Therapy:  Heparin level 0.3-0.7 units/ml Monitor platelets by anticoagulation protocol: Yes   Plan:  Decrease Heparin 1100 units/hr  Phillis Knack, PharmD, BCPS

## 2019-10-29 NOTE — Consult Note (Signed)
[place] Consultation  [name] [id number] DOB: October 29, 1945 DOA: 10/28/2019 PCP: [name], NP   Requesting physician: Rosaria Ferries, PA-C Date of consultation: 10/29/2019 Reason for consultation: Covid-19 positive  Impression/Recommendations Active Problems:   Persistent atrial fibrillation (HCC)   Atrial fibrillation with rapid ventricular response (A[facility])    1. A. fib with RVR: Acute.  Patient presented with heart rates into the 170s on admission.  Initially started on diltiazem without adequate rate control.  Cardiology consulted and switched to IV amiodarone with digoxin.  Elevated troponins thought to be secondary to demand.  Echocardiogram pending. -Per cardiology  2. Acute respiratory failure with hypoxia secondary to pneumonia due to COVID-19: Patient noted to be hypoxic -COVID-19 order set utilized -Continuous pulse oximetry with nasal cannula oxygen to maintain O2 saturation greater than 90% -Albuterol inhaler -Remdesivir per pharmacy -Decadron 6 mg daily -Check inflammatory markers and monitor daily -Vitamin C and zinc -Antitussives as needed  3. SIRS/Sespis: Appears to have been present on admission.  -Follow-up procalcitonin and consider adding on antibiotics  4. Congestive heart failure: Acute.  On admission BNP 761.9.  Patient does not appear acutely volume overloaded on physical exam.  She had been given Lasix IV x1 dose in the ED.  Suspect secondary to to above. -Follow-up echo  5. Acute kidney injury: Resolving.  Creatinine appears to be trending down 1.65->1.4-> 1.16.  Previous baseline noted to be around 0.8.  -Continue to monitor  6. Prediabetes with hyperglycemia: Hemoglobin A1c noted to be 6.3 on 4/26. -Hypoglycemic protocols -Continue moderate sliding scale insulin with meals while on steroids -Consider placing on empiric antibiotics if procalcitonin seen to be  7. Morbid obesity: BMI 51.02 kg/m  I will followup again  tomorrow. Please contact me if I can be of assistance in the meanwhile. Thank you for this consultation.  Chief Complaint: Shortness of breath  HPI:  Julia Mcfarland is a 74 y.o. female with medical history significant of chronic venous insufficiency, arthritis, and morbid obesity presented complaints of palpitations, generalized malaise, and shortness of breath over the last 3 days.  Associated symptoms included chills, generalized weakness, and a nonproductive cough.  Upon admission she was found to be in A. fib with RVR with heart rates into the 170s.  Patient had been started on diltiazem drip heart rates improving into the 130s.  Admission labs revealed BUN 24, creatinine 165 and glucose 235, Trop 43->52->55, and BNP 761.9.  Patient was noted to have O2 saturations as low as 84% for which she was placed on 3 L nasal cannula oxygen.  Cardiology admitted the patient she had been started on a heparin drip.  Patient was switched from diltiazem to amiodarone due to adequate control of heart rates along with the addition of digoxin.  Chest x-rays had revealed bilateral infiltrates and COVID-19 screening did come back positive.  She notes that her daughter had tested positive for Covid-19 and is currently in the hospital.  Patient did not receive any COVID-19 vaccines.   Review of Systems  Constitutional: Positive for chills and malaise/fatigue.  Respiratory: Positive for cough and shortness of breath.   Cardiovascular: Positive for leg swelling. Negative for chest pain.  Gastrointestinal: Negative for abdominal pain.     Past Medical History:  Diagnosis Date  . Allergy   . Arthritis   . Chicken pox   . Chronic venous insufficiency    a. Uses furosemide prn.  . Degenerative joint disease   . Morbid obesity (  [place])   . Osteoarthritis   . PONV (postoperative nausea and vomiting)    Past Surgical History:  Procedure Laterality Date  . ABDOMINAL HYSTERECTOMY  1988  . CARPAL TUNNEL RELEASE Right    . REPLACEMENT TOTAL KNEE Right 2010  . 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 She has never used smokeless tobacco. She reports current alcohol use. She reports that she does not use drugs.  No Known Allergies Family History  Problem Relation Age of Onset  . Arthritis Mother   . Heart disease Mother   . Hypertension Mother   . Diabetes Mother   . Stroke Father   . Hypertension Father   . Stroke Sister   . Hypertension Sister   . Heart disease Maternal Grandfather   . Diabetes Paternal Grandmother   . Heart disease Paternal Grandfather     Prior to Admission medications   Medication Sig Start Date End Date Taking? Authorizing Provider  albuterol (VENTOLIN HFA) 108 (90 Base) MCG/ACT inhaler Inhale 2 puffs into the lungs every 6 (six) hours as needed for wheezing or shortness of breath. 09/19/19  Yes [name], NP  Cholecalciferol (VITAMIN D3) 5000 UNITS CAPS Take 5,000 Units by mouth daily.    Yes [provider]  fexofenadine (ALLEGRA ALLERGY) 180 MG tablet Take 1 tablet (180 mg total) by mouth daily. 09/19/19  Yes [name], NP  furosemide (LASIX) 20 MG tablet Take 1 tablet (20 mg total) by mouth daily. 09/19/19  Yes Baity, Coralie Keens, NP  hydroxypropyl methylcellulose / hypromellose (ISOPTO TEARS / GONIOVISC) 2.5 % ophthalmic solution Place 1 drop into both eyes daily as needed for dry eyes.   Yes [provider]  vitamin B-12 (CYANOCOBALAMIN) 500 MCG tablet Take 500 mcg by mouth daily.   Yes [provider]   Physical Exam:  Constitutional: Elderly female who appears acutely ill in some respiratory distress Vitals:   10/29/19 0500 10/29/19 0530 10/29/19 0630 10/29/19 0800  BP: 112/90 121/73 (!) 111/92 105/71  Pulse: 91 74 (!) 140 (!) 130  Resp: (!) 26  (!) 39 (!) 32 (!) 34  Temp:      TempSrc:      SpO2: 90% 91% 93% 91%  Weight:       Eyes: PERRL, lids and conjunctivae normal ENMT: Mucous membranes are moist. Posterior pharynx clear of any exudate or lesions.  Neck: normal, supple, no masses, no thyromegaly Respiratory: Tachypneic with decreased overall aeration and crackles appreciated in both lung fields.  Patient currently on 3 L of nasal cannula oxygen with O2 saturations hovering around 90%. Cardiovascular: Irregular  irregular no murmurs / rubs / gallops.  Trace lower extremity edema. 2+ pedal pulses. No carotid bruits.  Abdomen: no tenderness, no masses palpated. No hepatosplenomegaly. Bowel sounds positive.  Musculoskeletal: no clubbing / cyanosis. No joint deformity upper and lower extremities. Good ROM, no contractures. Normal muscle tone.  Skin: no rashes, lesions, ulcers. No induration Neurologic: CN 2-12 grossly intact. Sensation intact, DTR normal. Strength 5/5 in all 4.  Psychiatric: Normal judgment and insight. Alert and oriented x 3. Normal mood.   Labs on Admission:  Basic Metabolic Panel: Recent Labs  Lab 10/28/19 1511 10/28/19 1535 10/29/19 0450  NA 141 140 142  K 3.9 3.8 3.6  CL 99 101  103  CO2 25  --  27  GLUCOSE 235* 227* 170*  BUN 24* 28* 22  CREATININE 1.65* 1.40* 1.16*  CALCIUM 8.6*  --  8.0*   Liver Function Tests: No results for input(s): AST, ALT, ALKPHOS, BILITOT, PROT, ALBUMIN in the last 168 hours. No results for input(s): LIPASE, AMYLASE in the last 168 hours. No results for input(s): AMMONIA in the last 168 hours. CBC: Recent Labs  Lab 10/28/19 1511 10/28/19 1535 10/29/19 0450  WBC 4.0  --  3.9*  HGB 15.7* 16.3* 14.8  HCT 49.5* 48.0* 46.1*  MCV 94.1  --  94.1  PLT 206  --  206   Cardiac Enzymes: No results for input(s): CKTOTAL, CKMB, CKMBINDEX, TROPONINI in the last 168 hours. BNP: Invalid input(s): POCBNP CBG: Recent Labs  Lab 10/28/19 2327  GLUCAP 145*    Radiological  Exams on Admission: DG Chest Portable 1 View  Result Date: 10/28/2019 CLINICAL DATA:  Dyspnea with exertion. EXAM: PORTABLE CHEST 1 VIEW COMPARISON:  February 28, 2017. FINDINGS: Stable cardiomediastinal silhouette. Central pulmonary vascular congestion is noted. Bilateral lung opacities are noted which may represent edema or possibly multifocal pneumonia. Atherosclerosis of thoracic aorta is noted. No pneumothorax or pleural effusion is noted. Bony thorax is unremarkable. IMPRESSION: Aortic atherosclerosis. Central pulmonary vascular congestion is noted. Bilateral lung opacities are noted which may represent edema or possibly multifocal pneumonia. Aortic Atherosclerosis (ICD10-I70.0). Electronically Signed   By: Marijo Conception M.D.   On: 10/28/2019 15:42    EKG: Independently reviewed.  Atrial fibrillation with RVR at 172 bpm  Time spent: >45 minutes  [place] [facility] Pager [phone]  If 7PM-7AM, please contact night-coverage [email or web address] Password [facility] 10/29/2019, 9:12 AM

## 2019-10-29 NOTE — Significant Event (Signed)
Rapid Response Event Note  Overview: Time Called: [id number] Arrival Time: [id number] Event Type: Cardiac, Respiratory Pt remains tachycardic and tachypneic. Pt admitted new onset AFib w RVR in the setting of COVID-19 pneumonia. Per Cardiology note, pt afib has been challenging to rate control.  BP 112/98, HR 125, RR 38, SpO2 92% on 3LNC.  Initial Focused Assessment: Pt lying in bed, awake, alert. Pt is able to follow commands and speak in full sentences. Pt is tachypneic, but breathing is shallow and pt is not in any distress. Respiratory rate manually checked and confirmed to be 38 breaths/minute. Lung sounds are clear. No accessory muscle use. Radial pulse is irregular. Heart sounds are distant. After review of pt's vital signs over the last few hours, it appears her heart rate is gradually trending down. This morning it appears to have been 140s-150s bpm and now pt's heart rate is sustaining 120-130 bpm. Pt is currently on Amio gtt at 16.67 mL/hr and has also received PO Digoxin 0.25 mg and PO Lopressor 12.5 mg. Pt has no complaints. She states her breathing feels better and that intermittently she feels her heart beating fast.   Interventions: -No intervention from RR RN.  Plan of Care (if not transferred): -Monitor pt for acute distress and increased supplemental oxygen requirements  -Continue to monitor pt's heart rate in response to current treatment, notify provider if unable to meet heart rate goal with current treatment in plan  Call rapid response RN for further needs.   Event Summary: Outcome: Stayed in room and stabalized Event End Time: [place]

## 2019-10-30 ENCOUNTER — Inpatient Hospital Stay (HOSPITAL_COMMUNITY): Payer: Medicare HMO

## 2019-10-30 DIAGNOSIS — I4891 Unspecified atrial fibrillation: Secondary | ICD-10-CM | POA: Diagnosis not present

## 2019-10-30 LAB — COMPREHENSIVE METABOLIC PANEL
ALT: 29 U/L (ref 0–44)
AST: 45 U/L — ABNORMAL HIGH (ref 15–41)
Albumin: 2.5 g/dL — ABNORMAL LOW (ref 3.5–5.0)
Alkaline Phosphatase: 40 U/L (ref 38–126)
Anion gap: 11 (ref 5–15)
BUN: 24 mg/dL — ABNORMAL HIGH (ref 8–23)
CO2: 25 mmol/L (ref 22–32)
Calcium: 8.3 mg/dL — ABNORMAL LOW (ref 8.9–10.3)
Chloride: 104 mmol/L (ref 98–111)
Creatinine, Ser: 1.1 mg/dL — ABNORMAL HIGH (ref 0.44–1.00)
GFR calc Af Amer: 58 mL/min — ABNORMAL LOW (ref >60)
GFR calc non Af Amer: 50 mL/min — ABNORMAL LOW (ref >60)
Glucose, Bld: 196 mg/dL — ABNORMAL HIGH (ref 70–99)
Potassium: 4.1 mmol/L (ref 3.5–5.1)
Sodium: 140 mmol/L (ref 135–145)
Total Bilirubin: 0.6 mg/dL (ref 0.3–1.2)
Total Protein: 5.9 g/dL — ABNORMAL LOW (ref 6.5–8.1)

## 2019-10-30 LAB — CBC
HCT: 44.7 % (ref 36.0–46.0)
Hemoglobin: 14.7 g/dL (ref 12.0–15.0)
MCH: 30.4 pg (ref 26.0–34.0)
MCHC: 32.9 g/dL (ref 30.0–36.0)
MCV: 92.4 fL (ref 80.0–100.0)
Platelets: 283 10*3/uL (ref 150–400)
RBC: 4.84 MIL/uL (ref 3.87–5.11)
RDW: 12.9 % (ref 11.5–15.5)
WBC: 3.8 10*3/uL — ABNORMAL LOW (ref 4.0–10.5)
nRBC: 0 % (ref 0.0–0.2)

## 2019-10-30 LAB — MAGNESIUM: Magnesium: 2.1 mg/dL (ref 1.7–2.4)

## 2019-10-30 LAB — HEPARIN LEVEL (UNFRACTIONATED): Heparin Unfractionated: 0.63 IU/mL (ref 0.30–0.70)

## 2019-10-30 LAB — D-DIMER, QUANTITATIVE: D-Dimer, Quant: 0.74 ug/mL-FEU — ABNORMAL HIGH (ref 0.00–0.50)

## 2019-10-30 LAB — BRAIN NATRIURETIC PEPTIDE: B Natriuretic Peptide: 237.8 pg/mL — ABNORMAL HIGH (ref 0.0–100.0)

## 2019-10-30 LAB — GLUCOSE, CAPILLARY
Glucose-Capillary: 169 mg/dL — ABNORMAL HIGH (ref 70–99)
Glucose-Capillary: 223 mg/dL — ABNORMAL HIGH (ref 70–99)
Glucose-Capillary: 249 mg/dL — ABNORMAL HIGH (ref 70–99)
Glucose-Capillary: 323 mg/dL — ABNORMAL HIGH (ref 70–99)

## 2019-10-30 LAB — PROCALCITONIN: Procalcitonin: <0.1 ng/mL

## 2019-10-30 LAB — C-REACTIVE PROTEIN: CRP: 12.1 mg/dL — ABNORMAL HIGH (ref <1.0)

## 2019-10-30 LAB — FERRITIN: Ferritin: 1344 ng/mL — ABNORMAL HIGH (ref 11–307)

## 2019-10-30 LAB — PHOSPHORUS: Phosphorus: 3.9 mg/dL (ref 2.5–4.6)

## 2019-10-30 MED ORDER — METOPROLOL TARTRATE 25 MG PO TABS: 25.0000 mg | ORAL_TABLET | Freq: Two times a day (BID) | ORAL | Status: DC

## 2019-10-30 MED ORDER — APIXABAN 5 MG PO TABS
5.0000 mg | ORAL_TABLET | Freq: Two times a day (BID) | ORAL | Status: DC
  Administered 2019-10-30 – 2019-10-31 (×3): 5 mg via ORAL
  Filled 2019-10-30 (×3): qty 1

## 2019-10-30 MED ORDER — DIGOXIN 0.25 MG/ML IJ SOLN
0.1250 mg | Freq: Once | INTRAMUSCULAR | Status: AC
  Administered 2019-10-30: 14:00:00 0.125 mg via INTRAVENOUS
  Filled 2019-10-30: qty 2

## 2019-10-30 MED ORDER — METHYLPREDNISOLONE SODIUM SUCC 125 MG IJ SOLR
60.0000 mg | Freq: Two times a day (BID) | INTRAMUSCULAR | Status: DC
  Administered 2019-10-30 – 2019-10-31 (×3): 60 mg via INTRAVENOUS
  Filled 2019-10-30 (×3): qty 2

## 2019-10-30 MED ORDER — TOCILIZUMAB 400 MG/20ML IV SOLN
800.0000 mg | Freq: Once | INTRAVENOUS | Status: AC
  Administered 2019-10-30: 800 mg via INTRAVENOUS
  Filled 2019-10-30: qty 40

## 2019-10-30 MED ORDER — LACTATED RINGERS IV SOLN: INTRAVENOUS | Status: AC

## 2019-10-30 MED ORDER — METOPROLOL TARTRATE 25 MG PO TABS
25.0000 mg | ORAL_TABLET | Freq: Two times a day (BID) | ORAL | Status: DC
  Administered 2019-10-30 – 2019-10-31 (×2): 25 mg via ORAL
  Filled 2019-10-30 (×2): qty 1

## 2019-10-30 MED ORDER — MIDODRINE HCL 5 MG PO TABS
10.0000 mg | ORAL_TABLET | Freq: Three times a day (TID) | ORAL | Status: DC
  Administered 2019-10-30 – 2019-10-31 (×5): 10 mg via ORAL
  Filled 2019-10-30 (×5): qty 2

## 2019-10-30 NOTE — Progress Notes (Addendum)
ANTICOAGULATION CONSULT NOTE - Follow Up Consult  Pharmacy Consult for Heparin > Eliquis Indication: atrial fibrillation  No Known Allergies  Patient Measurements: Height: 5\' 1"  (154.9 cm) Weight: 121.7 kg (268 lb 4.8 oz) IBW/kg (Calculated) : 47.8 Heparin Dosing Weight: 80 kg  Vital Signs: Temp: 97.8 F (36.6 C) (04/28 1145) Temp Source: Oral (04/28 1145) BP: 111/96 (04/28 1145) Pulse Rate: 126 (04/28 1145)  Labs: Recent Labs    10/28/19 1511 10/28/19 1511 10/28/19 1535 10/28/19 1535 10/28/19 1727 10/28/19 1906 10/28/19 2157 10/29/19 0450 10/29/19 1552 10/30/19 0631  HGB 15.7*   < > 16.3*   < >  --   --   --  14.8  --  14.7  HCT 49.5*   < > 48.0*  --   --   --   --  46.1*  --  44.7  PLT 206  --   --   --   --   --   --  206  --  283  HEPARINUNFRC  --   --   --   --   --   --   --  0.72* 0.85* 0.63  CREATININE 1.65*   < > 1.40*  --   --   --   --  1.16*  --  1.10*  TROPONINIHS 43*   < >  --   --  52* 55* 59*  --   --   --    < > = values in this interval not displayed.    Estimated Creatinine Clearance: 56.5 mL/min (A) (by C-G formula based on SCr of 1.1 mg/dL (H)).  Assessment:   74 yr old female on IV heparin for new atrial fibrillation. IV heparin begun 4/26 pm.     Prior heparin levels were supratherapeutic and infusion rate decreased x 2. Heparin level is now therapeutic (0.63) on 900 units/hr.  CBC good.   Goal of Therapy:  Heparin level 0.3-0.7 units/ml Monitor platelets by anticoagulation protocol: Yes   Plan:   Continue heparin drip at 900 units/hr  Daily heparin level and CBC.  Follow up tranisition to oral anticoagulation when able.  Arty Baumgartner, [place] Phone: [phone] 10/30/2019,12:03 PM   Addendum:   To transition to Eliquis.    Eliquis 5 mg PO BID.      IV heparin to stop when giving first Eliquis dose.  Consuello Masse, RPh 10/30/2019  2:20 PM

## 2019-10-30 NOTE — Progress Notes (Signed)
PROGRESS NOTE                                                                                                                                                                                                             [name], is a 74 y.o. female, DOB - [birth year], [id number]  Admit date - 10/28/2019   Admitting Physician Sanda Klein, MD  Outpatient Primary MD for the patient is Baity, Coralie Keens, NP  LOS - 2  Chief Complaint  Patient presents with  . Tachycardia       Brief Narrative  - Julia Mcfarland is a 74 y.o. female with medical history significant of chronic venous insufficiency, arthritis, and morbid obesity presented complaints of palpitations, generalized malaise, and shortness of breath over the last 3 days.  Associated symptoms included chills, generalized weakness, and a nonproductive cough.  Upon admission she was found to be in A. fib with RVR with heart rates into the 170s.  Patient had been started on diltiazem drip heart rates improving into the 130s.  Admission labs revealed BUN 24, creatinine 165 and glucose 235, Trop 43->52->55, and BNP 761.9.    She was also found to have severe acute hypoxic respiratory failure due to COVID-19 pneumonia and admitted to the hospital.   Subjective:    [name] today has, No headache, No chest pain, No abdominal pain - No Nausea, No new weakness tingling or numbness, +ve SOB   Assessment  & Plan :    Principal Problem:   Atrial fibrillation with rapid ventricular response (HCC) Active Problems:   Morbid obesity with BMI of 50.0-59.9, adult ([place])   Prediabetes   Pneumonia due to COVID-19 virus   Acute respiratory failure with hypoxia ([place])   AKI (acute kidney injury) ([place])   1.  Acute hypoxic respiratory failure due to COVID-19 pneumonia.  She has severe disease, currently requiring 8 to 10 L of oxygen, in moderate distress, has been started on steroids and remdesivir will  receive Actemra right away on 10/30/2019, she has consented for the use.  We will encourage her to sit up in the chair use flutter valve and incentive spirometer for pulmonary toiletry and prone at night if possible.  Overall condition is quite tenuous.  Actemra off label use - patient was told that if COVID-19 pneumonitis gets worse we might potentially use Actemra off label, she denies any known history of active diverticulitis, tuberculosis or hepatitis, no active Diverticulitis  or known other active infections, understands the risks and benefits and wants to proceed with Actemra treatment if required.   Recent Labs  Lab 10/28/19 1511 10/28/19 1515 10/28/19 1535 10/28/19 1917 10/29/19 0450 10/29/19 0956 10/30/19 0631  NA 141  --  140  --  142  --  140  K 3.9  --  3.8  --  3.6  --  4.1  CL 99  --  101  --  103  --  104  CO2 25  --   --   --  27  --  25  GLUCOSE 235*  --  227*  --  170*  --  196*  BUN 24*  --  28*  --  22  --  24*  CREATININE 1.65*  --  1.40*  --  1.16*  --  1.10*  CALCIUM 8.6*  --   --   --  8.0*  --  8.3*  AST  --   --   --   --   --  52* 45*  ALT  --   --   --   --   --  33 29  ALKPHOS  --   --   --   --   --  41 40  BILITOT  --   --   --   --   --  1.0 0.6  ALBUMIN  --   --   --   --   --  2.6* 2.5*  MG  --   --   --   --   --   --  2.1  CRP  --   --   --   --   --  12.0* 12.1*  DDIMER  --   --   --   --   --  0.84* 0.74*  PROCALCITON  --   --   --   --   --  <0.10 <0.10  TSH  --   --   --  0.725  --   --   --   HGBA1C  --   --   --  6.3*  --   --   --   BNP  --  761.9*  --   --   --   --  237.8*    Recent Labs  Lab 10/28/19 1515 10/28/19 1618 10/29/19 0956 10/30/19 0631  CRP  --   --  12.0* 12.1*  DDIMER  --   --  0.84* 0.74*  BNP 761.9*  --   --  237.8*  PROCALCITON  --   --  <0.10 <0.10  SARSCOV2NAA  --  POSITIVE*  --   --       2.  Paroxysmal A. fib RVR.  Mali vas 2 score of greater than 3.  Cardiology on board, currently receiving IV  amiodarone, beta-blocker if tolerated by her blood pressure, she is on oral digoxin will give her dose of IV digoxin extra on 10/30/2019 for better rate control, heart rate is in 130s and she is hypotensive.  Would also challenge her with IV fluid bolus and monitor closely.  Stable TSH and echocardiogram with EF of 60%.  Mild non-ACS pattern flat trend of high-sensitivity troponin likely due to demand ischemia from RVR.  Chest pain-free no acute issues.  Cardiology following.  3.  Obesity with BMI of 51.  Follow with PCP for weight loss.  4.  AKI.  Currently seems to have resolved will  monitor closely.  5.  DM type II.  We will place her on sliding scale and monitor.  Lab Results  Component Value Date   HGBA1C 6.3 (H) 10/28/2019   CBG (last 3)  Recent Labs    10/29/19 2136 10/30/19 0722 10/30/19 1143  GLUCAP 187* 169* 223*      Family Communication  : Husband [name] [phone] on 10/30/2019  Code Status :  Full  Disposition Plan  : Stay in the hospital for treatment of severe acute hypoxic respiratory failure caused by COVID-19 pneumonia, A. fib RVR.  Consults  : Cards  Procedures  :    TTE -  1. Normal LV function; mild LAE.  2. Left ventricular ejection fraction, by estimation, is 55 to 60%. The left ventricle has normal function. The left ventricle has no regional wall motion abnormalities. Left ventricular diastolic function could not be evaluated.  3. Right ventricular systolic function is normal. The right ventricular size is normal. Tricuspid regurgitation signal is inadequate for assessing PA pressure.  4. Left atrial size was mildly dilated.  5. The mitral valve is normal in structure. Trivial mitral valve regurgitation. No evidence of mitral stenosis.  6. The aortic valve is tricuspid. Aortic valve regurgitation is not visualized. No aortic stenosis is present.  7. The inferior vena cava is normal in size with greater than 50% respiratory variability,  suggesting right atrial pressure of 3 mmHg.  DVT Prophylaxis  :  Eliquis  Lab Results  Component Value Date   PLT 283 10/30/2019    Diet :  Diet Order            Diet heart healthy/carb modified Room service appropriate? Yes; Fluid consistency: Thin  Diet effective now               Inpatient Medications Scheduled Meds: . albuterol  2 puff Inhalation Q6H  . digoxin  0.125 mg Intravenous Once  . digoxin  0.125 mg Oral Daily  . famotidine  20 mg Oral BID  . insulin aspart  0-15 Units Subcutaneous TID WC  . loratadine  10 mg Oral Daily  . methylPREDNISolone (SOLU-MEDROL) injection  60 mg Intravenous BID  . metoprolol tartrate  25 mg Oral BID   Continuous Infusions: . amiodarone 30 mg/hr (10/30/19 0300)  . remdesivir 100 mg in NS 100 mL 100 mg (10/30/19 0815)  . tocilizumab (ACTEMRA) - non-COVID treatment 800 mg (10/30/19 1258)   PRN Meds:.acetaminophen, albuterol, ondansetron (ZOFRAN) IV  Antibiotics  :   Anti-infectives (From admission, onward)   Start     Dose/Rate Route Frequency Ordered Stop   10/30/19 1000  remdesivir 100 mg in sodium chloride 0.9 % 100 mL IVPB     100 mg 200 mL/hr over 30 Minutes Intravenous Daily 10/29/19 0914 11/03/19 0959   10/29/19 1000  remdesivir 200 mg in sodium chloride 0.9% 250 mL IVPB     200 mg 580 mL/hr over 30 Minutes Intravenous Once 10/29/19 0914 10/29/19 1236         Objective:   Vitals:   10/30/19 1145 10/30/19 1200 10/30/19 1224 10/30/19 1225  BP: (!) 111/96     Pulse: (!) 126  (!) 118   Resp: (!) 45  (!) 31 (!) 29  Temp: 97.8 F (36.6 C)     TempSrc: Oral     SpO2: (!) 88%     Weight:      Height:  5\' 1"  (1.549 m)      SpO2: Mar[place] Kitchen)  88 % O2 Flow Rate (L/min): 7 L/min  Wt Readings from Last 3 Encounters:  10/30/19 121.7 kg  09/19/19 132 kg  02/18/19 127.9 kg     Intake/Output Summary (Last 24 hours) at 10/30/2019 1312 Last data filed at 10/30/2019 0348 Gross per 24 hour  Intake 1075.42 ml  Output 550 ml    Net 525.42 ml     Physical Exam  Awake Alert, No new F.N deficits, Normal affect .AT,PERRAL Supple Neck,No JVD, No cervical lymphadenopathy appriciated.  Symmetrical Chest wall movement, Good air movement bilaterally, few rales iRRR,No Gallops,Rubs or new Murmurs, No Parasternal Heave +ve B.Sounds, Abd Soft, No tenderness, No organomegaly appriciated, No rebound - guarding or rigidity. No Cyanosis, Clubbing or edema, No new Rash or bruise      Data Review:    Recent Labs  Lab 10/28/19 1511 10/28/19 1535 10/29/19 0450 10/30/19 0631  WBC 4.0  --  3.9* 3.8*  HGB 15.7* 16.3* 14.8 14.7  HCT 49.5* 48.0* 46.1* 44.7  PLT 206  --  206 283  MCV 94.1  --  94.1 92.4  MCH 29.8  --  30.2 30.4  MCHC 31.7  --  32.1 32.9  RDW 12.9  --  12.9 12.9    Recent Labs  Lab 10/28/19 1511 10/28/19 1515 10/28/19 1535 10/28/19 1917 10/29/19 0450 10/29/19 0956 10/30/19 0631  NA 141  --  140  --  142  --  140  K 3.9  --  3.8  --  3.6  --  4.1  CL 99  --  101  --  103  --  104  CO2 25  --   --   --  27  --  25  GLUCOSE 235*  --  227*  --  170*  --  196*  BUN 24*  --  28*  --  22  --  24*  CREATININE 1.65*  --  1.40*  --  1.16*  --  1.10*  CALCIUM 8.6*  --   --   --  8.0*  --  8.3*  AST  --   --   --   --   --  52* 45*  ALT  --   --   --   --   --  33 29  ALKPHOS  --   --   --   --   --  41 40  BILITOT  --   --   --   --   --  1.0 0.6  ALBUMIN  --   --   --   --   --  2.6* 2.5*  MG  --   --   --   --   --   --  2.1  CRP  --   --   --   --   --  12.0* 12.1*  DDIMER  --   --   --   --   --  0.84* 0.74*  PROCALCITON  --   --   --   --   --  <0.10 <0.10  TSH  --   --   --  0.725  --   --   --   HGBA1C  --   --   --  6.3*  --   --   --   BNP  --  761.9*  --   --   --   --  237.8*    Recent Labs  Lab 10/28/19 1515  10/28/19 1618 10/29/19 0956 10/30/19 0631  CRP  --   --  12.0* 12.1*  DDIMER  --   --  0.84* 0.74*  BNP 761.9*  --   --  237.8*  PROCALCITON  --   --  <0.10 <0.10   SARSCOV2NAA  --  POSITIVE*  --   --     ------------------------------------------------------------------------------------------------------------------ Recent Labs    10/29/19 0450  CHOL 133  HDL 26*  LDLCALC 90  TRIG 84  CHOLHDL 5.1    Lab Results  Component Value Date   HGBA1C 6.3 (H) 10/28/2019   ------------------------------------------------------------------------------------------------------------------ Recent Labs    10/28/19 1917  TSH 0.725   ------------------------------------------------------------------------------------------------------------------ Recent Labs    10/29/19 0956 10/30/19 0631  FERRITIN 1,387* 1,344*    Coagulation profile No results for input(s): INR, PROTIME in the last 168 hours.  Recent Labs    10/29/19 0956 10/30/19 0631  DDIMER 0.84* 0.74*    Cardiac Enzymes No results for input(s): CKMB, TROPONINI, MYOGLOBIN in the last 168 hours.  Invalid input(s): CK ------------------------------------------------------------------------------------------------------------------    Component Value Date/Time   BNP 237.8 (H) 10/30/2019 0631    Micro Results Recent Results (from the past 240 hour(s))  SARS CORONAVIRUS 2 (TAT 6-24 HRS) Nasopharyngeal Nasopharyngeal Swab     Status: Abnormal   Collection Time: 10/28/19  4:18 PM   Specimen: Nasopharyngeal Swab  Result Value Ref Range Status   SARS Coronavirus 2 POSITIVE (A) NEGATIVE Final    Comment: RESULT CALLED TO, READ BACK BY AND VERIFIED WITH: M. RUGGIERO,RN 0020 10/29/2019 T. TYSOR (NOTE) SARS-CoV-2 target nucleic acids are DETECTED. The SARS-CoV-2 RNA is generally detectable in upper and lower respiratory specimens during the acute phase of infection. Positive results are indicative of the presence of SARS-CoV-2 RNA. Clinical correlation with patient history and other diagnostic information is  necessary to determine patient infection status. Positive results do not  rule out bacterial infection or co-infection with other viruses.  The expected result is Negative. Fact Sheet for Patients: SugarRoll.be Fact Sheet for Healthcare Providers: https://www.woods-mathews.com/ This test is not yet approved or cleared by the [place] FDA and  has been authorized for detection and/or diagnosis of SARS-CoV-2 by FDA under an Emergency Use Authorization (EUA). This EUA will remain  in effect (meaning this test can be used) for  the duration of the COVID-19 declaration under Section 564(b)(1) of the Act, 21 U.S.C. section 360bbb-3(b)(1), unless the authorization is terminated or revoked sooner. Performed at [facility], [place] [address] [place], [place] [address]     Radiology Reports DG Chest [place] 1 View  Result Date: 10/30/2019 CLINICAL DATA:  COVID positive. EXAM: PORTABLE CHEST 1 VIEW COMPARISON:  Chest x-ray dated October 28, 2019. FINDINGS: Stable cardiomediastinal silhouette. Normal pulmonary vascularity. Low lung volumes. Patchy interstitial and airspace opacities throughout both lungs, mildly worsened at the left lung base. No pleural effusion or pneumothorax. No acute osseous abnormality. IMPRESSION: 1. Multifocal pneumonia, mildly worsened at the left lung base. Electronically Signed   By: Titus Dubin M.D.   On: 10/30/2019 08:26   DG Chest Portable 1 View  Result Date: 10/28/2019 CLINICAL DATA:  Dyspnea with exertion. EXAM: PORTABLE CHEST 1 VIEW COMPARISON:  February 28, 2017. FINDINGS: Stable cardiomediastinal silhouette. Central pulmonary vascular congestion is noted. Bilateral lung opacities are noted which may represent edema or possibly multifocal pneumonia. Atherosclerosis of thoracic aorta is noted. No pneumothorax or pleural effusion is noted. Bony thorax is unremarkable. IMPRESSION: Aortic atherosclerosis. Central pulmonary  vascular congestion is noted. Bilateral lung opacities are noted which may  represent edema or possibly multifocal pneumonia. Aortic Atherosclerosis (ICD10-I70.0). Electronically Signed   By: Marijo Conception M.D.   On: 10/28/2019 15:42   ECHOCARDIOGRAM COMPLETE  Result Date: 10/29/2019    ECHOCARDIOGRAM REPORT   Patient Name:   TESHAWNA WELT Date of Exam: 10/29/2019 Medical Rec #:  [id number]      Height:       61.0 in Accession #:    [id number]     Weight:       270.0 lb Date of Birth:  [birth year]      BSA:          2.146 m Patient Age:    30 years       BP:           136/73 mmHg Patient Gender: F              HR:           135 bpm. Exam Location:  Inpatient Procedure: 2D Echo, Cardiac Doppler and Color Doppler Indications:    I48.0 Paroxysmal atrial fibrillation  History:        Patient has no prior history of Echocardiogram examinations.                 COVID-19 Positive.  Sonographer:    Jonelle Sidle Dance Referring Phys: [place]  1. Normal LV function; mild LAE.  2. Left ventricular ejection fraction, by estimation, is 55 to 60%. The left ventricle has normal function. The left ventricle has no regional wall motion abnormalities. Left ventricular diastolic function could not be evaluated.  3. Right ventricular systolic function is normal. The right ventricular size is normal. Tricuspid regurgitation signal is inadequate for assessing PA pressure.  4. Left atrial size was mildly dilated.  5. The mitral valve is normal in structure. Trivial mitral valve regurgitation. No evidence of mitral stenosis.  6. The aortic valve is tricuspid. Aortic valve regurgitation is not visualized. No aortic stenosis is present.  7. The inferior vena cava is normal in size with greater than 50% respiratory variability, suggesting right atrial pressure of 3 mmHg. FINDINGS  Left Ventricle: Left ventricular ejection fraction, by estimation, is 55 to 60%. The left ventricle has normal function. The left ventricle has no regional wall motion abnormalities. The left ventricular  internal cavity size was normal in size. There is  no left ventricular hypertrophy. Left ventricular diastolic function could not be evaluated due to atrial fibrillation. Left ventricular diastolic function could not be evaluated. Right Ventricle: The right ventricular size is normal. Right ventricular systolic function is normal. Tricuspid regurgitation signal is inadequate for assessing PA pressure. Left Atrium: Left atrial size was mildly dilated. Right Atrium: Right atrial size was normal in size. Pericardium: There is no evidence of pericardial effusion. Mitral Valve: The mitral valve is normal in structure. Normal mobility of the mitral valve leaflets. Trivial mitral valve regurgitation. No evidence of mitral valve stenosis. Tricuspid Valve: The tricuspid valve is normal in structure. Tricuspid valve regurgitation is mild . No evidence of tricuspid stenosis. Aortic Valve: The aortic valve is tricuspid. Aortic valve regurgitation is not visualized. No aortic stenosis is present. Pulmonic Valve: The pulmonic valve was not well visualized. Pulmonic valve regurgitation is trivial. No evidence of pulmonic stenosis. Aorta: The aortic root is normal in size and structure. Venous: The inferior vena cava is normal in size with greater than 50% respiratory  variability, suggesting right atrial pressure of 3 mmHg.  Additional Comments: Normal LV function; mild LAE.  LEFT VENTRICLE PLAX 2D LVIDd:         4.00 cm LVIDs:         2.85 cm LV PW:         1.10 cm LV IVS:        1.13 cm LVOT diam:     1.85 cm LV SV:         40 LV SV Index:   19 LVOT Area:     2.69 cm  RIGHT VENTRICLE             IVC RV Basal diam:  2.82 cm     IVC diam: 1.67 cm RV S prime:     12.20 cm/s TAPSE (M-mode): 0.9 cm LEFT ATRIUM           Index       RIGHT ATRIUM           Index LA diam:      3.20 cm 1.49 cm/m  RA Area:     15.80 cm LA Vol (A2C): 74.0 ml 34.49 ml/m RA Volume:   39.80 ml  18.55 ml/m LA Vol (A4C): 61.6 ml 28.71 ml/m  AORTIC VALVE  LVOT Vmax:   122.50 cm/s LVOT Vmean:  66.600 cm/s LVOT VTI:    0.150 m  AORTA Ao Root diam: 3.30 cm Ao Asc diam:  3.20 cm MITRAL VALVE MV Area (PHT): 3.62 cm    SHUNTS MV Decel Time: 210 msec    Systemic VTI:  0.15 m MV E velocity: 53.35 cm/s  Systemic Diam: 1.85 cm Kirk Ruths MD Electronically signed by Kirk Ruths MD Signature Date/Time: 10/29/2019/2:08:19 PM    Final     Time Spent in minutes  30   Lala Lund M.D on 10/30/2019 at 1:12 PM  To page go to [email or web address] - password [facility]

## 2019-10-30 NOTE — H&P (Signed)
Please refer to the History and Physical from 10/28/2019, 6:12 PM,  erroneously labeled "progress Note"

## 2019-10-30 NOTE — Progress Notes (Signed)
PT Cancellation Note  Patient Details Name: PANZIE BOORAS MRN: [id number] DOB: 04/08/1946   Cancelled Treatment:    Reason Eval/Treat Not Completed: Medical issues which prohibited therapy  Per chart review and discussion with RN this morning, patient's HR still high, not controlled. Hold PT evaluation at this time.  PT to attempt at a later time/date, as appropriate.  Birdie Hopes 10/30/2019, 9:27 AM

## 2019-10-30 NOTE — Progress Notes (Signed)
  Continues to be dyspneic with minimal activity, even moving in bed.  Taking deep breaths and speaking causes chest tightness. Continues to be hypoxemic 88-90% on high flow oxygen. Persistent atrial fibrillation with rapid ventricular response in the 130s despite intravenous amiodarone and beta-blockers in the [place].  Attempted to increase the dose of beta-blocker this morning when her systolic blood pressure was in the 110s, but her blood pressure has been lower since then and in the 80s-90s. She is on comprehensive anti-inflammatory therapy in the setting of COVID-19 pneumonia with dexamethasone, remdesivir and now to begin Actemra. Ultimately, her prognosis depends on the evolution of her viral syndrome and hypoxic respiratory failure. Appreciate Dr. Candiss Norse assuming primary role in her care.  We will continue to follow as consultants.

## 2019-10-31 ENCOUNTER — Inpatient Hospital Stay (HOSPITAL_COMMUNITY): Payer: Medicare HMO

## 2019-10-31 DIAGNOSIS — I4891 Unspecified atrial fibrillation: Secondary | ICD-10-CM | POA: Diagnosis not present

## 2019-10-31 LAB — COMPREHENSIVE METABOLIC PANEL
ALT: 30 U/L (ref 0–44)
AST: 35 U/L (ref 15–41)
Albumin: 2.5 g/dL — ABNORMAL LOW (ref 3.5–5.0)
Alkaline Phosphatase: 50 U/L (ref 38–126)
Anion gap: 15 (ref 5–15)
BUN: 21 mg/dL (ref 8–23)
CO2: 24 mmol/L (ref 22–32)
Calcium: 8.5 mg/dL — ABNORMAL LOW (ref 8.9–10.3)
Chloride: 102 mmol/L (ref 98–111)
Creatinine, Ser: 1.09 mg/dL — ABNORMAL HIGH (ref 0.44–1.00)
GFR calc Af Amer: 59 mL/min — ABNORMAL LOW (ref >60)
GFR calc non Af Amer: 51 mL/min — ABNORMAL LOW (ref >60)
Glucose, Bld: 239 mg/dL — ABNORMAL HIGH (ref 70–99)
Potassium: 4.2 mmol/L (ref 3.5–5.1)
Sodium: 141 mmol/L (ref 135–145)
Total Bilirubin: 0.8 mg/dL (ref 0.3–1.2)
Total Protein: 6.1 g/dL — ABNORMAL LOW (ref 6.5–8.1)

## 2019-10-31 LAB — BLOOD GAS, ARTERIAL
Acid-Base Excess: 2.8 mmol/L — ABNORMAL HIGH (ref 0.0–2.0)
Bicarbonate: 25.8 mmol/L (ref 20.0–28.0)
Drawn by: 252031
FIO2: 80
O2 Saturation: 94.8 %
Patient temperature: 37
pCO2 arterial: 32.5 mmHg (ref 32.0–48.0)
pH, Arterial: 7.511 — ABNORMAL HIGH (ref 7.350–7.450)
pO2, Arterial: 75.5 mmHg — ABNORMAL LOW (ref 83.0–108.0)

## 2019-10-31 LAB — CBC
HCT: 48 % — ABNORMAL HIGH (ref 36.0–46.0)
Hemoglobin: 15.4 g/dL — ABNORMAL HIGH (ref 12.0–15.0)
MCH: 30.1 pg (ref 26.0–34.0)
MCHC: 32.1 g/dL (ref 30.0–36.0)
MCV: 93.8 fL (ref 80.0–100.0)
Platelets: 378 10*3/uL (ref 150–400)
RBC: 5.12 MIL/uL — ABNORMAL HIGH (ref 3.87–5.11)
RDW: 12.9 % (ref 11.5–15.5)
WBC: 8.1 10*3/uL (ref 4.0–10.5)
nRBC: 0.2 % (ref 0.0–0.2)

## 2019-10-31 LAB — PROCALCITONIN: Procalcitonin: <0.1 ng/mL

## 2019-10-31 LAB — GLUCOSE, CAPILLARY
Glucose-Capillary: 243 mg/dL — ABNORMAL HIGH (ref 70–99)
Glucose-Capillary: 244 mg/dL — ABNORMAL HIGH (ref 70–99)
Glucose-Capillary: 260 mg/dL — ABNORMAL HIGH (ref 70–99)
Glucose-Capillary: 264 mg/dL — ABNORMAL HIGH (ref 70–99)
Glucose-Capillary: 292 mg/dL — ABNORMAL HIGH (ref 70–99)

## 2019-10-31 LAB — C-REACTIVE PROTEIN: CRP: 8.3 mg/dL — ABNORMAL HIGH (ref <1.0)

## 2019-10-31 LAB — MAGNESIUM: Magnesium: 2.2 mg/dL (ref 1.7–2.4)

## 2019-10-31 LAB — D-DIMER, QUANTITATIVE: D-Dimer, Quant: 1.18 ug/mL-FEU — ABNORMAL HIGH (ref 0.00–0.50)

## 2019-10-31 LAB — BRAIN NATRIURETIC PEPTIDE: B Natriuretic Peptide: 437.4 pg/mL — ABNORMAL HIGH (ref 0.0–100.0)

## 2019-10-31 MED ORDER — ALBUTEROL SULFATE (2.5 MG/3ML) 0.083% IN NEBU: 2.5000 mg | INHALATION_SOLUTION | RESPIRATORY_TRACT | Status: DC

## 2019-10-31 MED ORDER — PANTOPRAZOLE SODIUM 40 MG IV SOLR
40.0000 mg | INTRAVENOUS | Status: DC
  Administered 2019-11-01 (×2): 40 mg via INTRAVENOUS
  Filled 2019-10-31 (×2): qty 40

## 2019-10-31 MED ORDER — METHYLPREDNISOLONE SODIUM SUCC 125 MG IJ SOLR: 60.0000 mg | Freq: Every day | INTRAMUSCULAR | Status: DC

## 2019-10-31 MED ORDER — CHLORHEXIDINE GLUCONATE CLOTH 2 % EX PADS
6.0000 | MEDICATED_PAD | Freq: Every day | CUTANEOUS | Status: DC
  Administered 2019-11-01 – 2019-11-13 (×13): 6 via TOPICAL

## 2019-10-31 MED ORDER — INSULIN DETEMIR 100 UNIT/ML ~~LOC~~ SOLN
15.0000 [IU] | Freq: Two times a day (BID) | SUBCUTANEOUS | Status: DC
  Filled 2019-10-31 (×3): qty 0.15

## 2019-10-31 MED ORDER — SODIUM CHLORIDE 0.9 % IV BOLUS
500.0000 mL | Freq: Once | INTRAVENOUS | Status: AC | PRN
  Administered 2019-10-31: 500 mL via INTRAVENOUS

## 2019-10-31 MED ORDER — HEPARIN (PORCINE) 25000 UT/250ML-% IV SOLN
900.0000 [IU]/h | INTRAVENOUS | Status: DC
  Administered 2019-11-01: 900 [IU]/h via INTRAVENOUS
  Filled 2019-10-31: qty 250

## 2019-10-31 MED ORDER — FUROSEMIDE 40 MG PO TABS
40.0000 mg | ORAL_TABLET | Freq: Once | ORAL | Status: AC
  Administered 2019-10-31: 08:00:00 40 mg via ORAL
  Filled 2019-10-31: qty 1

## 2019-10-31 MED ORDER — LORAZEPAM 2 MG/ML IJ SOLN
0.5000 mg | Freq: Once | INTRAMUSCULAR | Status: AC
  Administered 2019-10-31: 0.5 mg via INTRAVENOUS
  Filled 2019-10-31: qty 1

## 2019-10-31 MED ORDER — FUROSEMIDE 10 MG/ML IJ SOLN
40.0000 mg | Freq: Once | INTRAMUSCULAR | Status: AC
  Administered 2019-11-01: 40 mg via INTRAVENOUS
  Filled 2019-10-31: qty 4

## 2019-10-31 MED ORDER — INSULIN GLARGINE 100 UNIT/ML ~~LOC~~ SOLN
20.0000 [IU] | Freq: Every day | SUBCUTANEOUS | Status: DC
  Administered 2019-10-31: 20 [IU] via SUBCUTANEOUS
  Filled 2019-10-31 (×2): qty 0.2

## 2019-10-31 MED ORDER — INSULIN ASPART 100 UNIT/ML ~~LOC~~ SOLN
0.0000 [IU] | SUBCUTANEOUS | Status: DC
  Administered 2019-11-01: 7 [IU] via SUBCUTANEOUS
  Administered 2019-11-01: 15 [IU] via SUBCUTANEOUS
  Administered 2019-11-01: 7 [IU] via SUBCUTANEOUS
  Administered 2019-11-01: 11 [IU] via SUBCUTANEOUS
  Administered 2019-11-01: 3 [IU] via SUBCUTANEOUS
  Administered 2019-11-01 (×2): 7 [IU] via SUBCUTANEOUS
  Administered 2019-11-02: 15 [IU] via SUBCUTANEOUS
  Administered 2019-11-02: 12:00:00 3 [IU] via SUBCUTANEOUS
  Administered 2019-11-02: 7 [IU] via SUBCUTANEOUS
  Administered 2019-11-03: 4 [IU] via SUBCUTANEOUS

## 2019-10-31 NOTE — Progress Notes (Addendum)
PROGRESS NOTE                                                                                                                                                                                                             [name], is a 74 y.o. female, DOB - [birth year], [id number]  Admit date - 10/28/2019   Admitting Physician Sanda Klein, MD  Outpatient Primary MD for the patient is Baity, Coralie Keens, NP  LOS - 3  Chief Complaint  Patient presents with  . Tachycardia       Brief Narrative  - Julia Mcfarland is a 74 y.o. female with medical history significant of chronic venous insufficiency, arthritis, and morbid obesity presented complaints of palpitations, generalized malaise, and shortness of breath over the last 3 days.  Associated symptoms included chills, generalized weakness, and a nonproductive cough.  Upon admission she was found to be in A. fib with RVR with heart rates into the 170s.     She was admitted by cardiology team on 26 April subsequently found to have COVID-19 pneumonia and hospitalist team was consulted.   Subjective:   Patient in bed, denies any headache, no chest or abdominal pain, she is still short of breath.  No focal weakness.   Assessment  & Plan :     1.  Acute hypoxic respiratory failure due to COVID-19 pneumonia.  She has severe disease, she was initially requiring 8 to 10 L of oxygen in the ER and was in moderate distress, had been started on steroids and remdesivir, she received Actemra right away on 10/30/2019 when I assumed care however she was 3 to 4 days into her lung inflammation and hypoxia already by then. She remains extremely tenuous currently on 30 L high flow oxygen, will try and advance activity and titrate down oxygen the best we can.  She has already incurred severe lung parenchymal injury and will take several days to improve provided there are no further setbacks.  We will encourage her to sit up  in the chair use flutter valve and incentive spirometer for pulmonary toiletry and prone at night if possible.  Overall condition is quite tenuous.     Recent Labs  Lab 10/28/19 1511 10/28/19 1515 10/28/19 1535 10/28/19 1917 10/29/19 0450 10/29/19 0956 10/30/19 0631 10/31/19 0503  NA 141  --  140  --  142  --  140 141  K 3.9  --  3.8  --  3.6  --  4.1 4.2  CL 99  --  101  --  103  --  104 102  CO2 25  --   --   --  27  --  25 24  GLUCOSE 235*  --  227*  --  170*  --  196* 239*  BUN 24*  --  28*  --  22  --  24* 21  CREATININE 1.65*  --  1.40*  --  1.16*  --  1.10* 1.09*  CALCIUM 8.6*  --   --   --  8.0*  --  8.3* 8.5*  AST  --   --   --   --   --  52* 45* 35  ALT  --   --   --   --   --  33 29 30  ALKPHOS  --   --   --   --   --  41 40 50  BILITOT  --   --   --   --   --  1.0 0.6 0.8  ALBUMIN  --   --   --   --   --  2.6* 2.5* 2.5*  MG  --   --   --   --   --   --  2.1 2.2  CRP  --   --   --   --   --  12.0* 12.1* 8.3*  DDIMER  --   --   --   --   --  0.84* 0.74* 1.18*  PROCALCITON  --   --   --   --   --  <0.10 <0.10 <0.10  TSH  --   --   --  0.725  --   --   --   --   HGBA1C  --   --   --  6.3*  --   --   --   --   BNP  --  761.9*  --   --   --   --  237.8* 437.4*    Recent Labs  Lab 10/28/19 1515 10/28/19 1618 10/29/19 0956 10/30/19 0631 10/31/19 0503  CRP  --   --  12.0* 12.1* 8.3*  DDIMER  --   --  0.84* 0.74* 1.18*  BNP 761.9*  --   --  237.8* 437.4*  PROCALCITON  --   --  <0.10 <0.10 <0.10  SARSCOV2NAA  --  POSITIVE*  --   --   --       2.  Paroxysmal A. fib RVR.  Mali vas 2 score of greater than 3.  Cardiology on board, currently receiving IV amiodarone, beta-blocker if tolerated by her blood pressure, she is on oral digoxin will give her dose of IV digoxin extra on 10/30/2019 for better rate control, heart rate is in 130s and she is hypotensive.  Would also challenge her with IV fluid bolus and monitor closely.  Stable TSH and echocardiogram with EF of 60%.   Mild non-ACS pattern flat trend of high-sensitivity troponin likely due to demand ischemia from RVR.  Chest pain-free no acute issues.  Cardiology following Case discussed with Dr. Sallyanne Kuster cardiologist.  3.  Obesity with BMI of 51.  Follow with PCP for weight loss.  4.  AKI.  Currently seems to have resolved will monitor closely.  5.  DM type II.  We will place her on sliding scale and monitor.  Lantus added for better control.  Lab  Results  Component Value Date   HGBA1C 6.3 (H) 10/28/2019   CBG (last 3)  Recent Labs    10/30/19 1531 10/30/19 2118 10/31/19 0800  GLUCAP 249* 323* 244*      Family Communication  : Husband [name] [phone] on 10/30/2019, 10/31/19, husband clearly explained that Julia Mcfarland's situation remains quite critical.  Code Status :  Full  Disposition Plan  : Stay inpatient in the hospital for treatment of severe acute hypoxic respiratory failure caused by COVID-19 pneumonia, A. fib RVR.  Once medically stable she will have to be reassessed where she is appropriate for discharge may require SNF.  Consults  : Cards  Procedures  :    TTE -  1. Normal LV function; mild LAE.  2. Left ventricular ejection fraction, by estimation, is 55 to 60%. The left ventricle has normal function. The left ventricle has no regional wall motion abnormalities. Left ventricular diastolic function could not be evaluated.  3. Right ventricular systolic function is normal. The right ventricular size is normal. Tricuspid regurgitation signal is inadequate for assessing PA pressure.  4. Left atrial size was mildly dilated.  5. The mitral valve is normal in structure. Trivial mitral valve regurgitation. No evidence of mitral stenosis.  6. The aortic valve is tricuspid. Aortic valve regurgitation is not visualized. No aortic stenosis is present.  7. The inferior vena cava is normal in size with greater than 50% respiratory variability, suggesting right atrial pressure of 3  mmHg.  DVT Prophylaxis  :  Eliquis  Lab Results  Component Value Date   PLT 378 10/31/2019    Diet :  Diet Order            Diet heart healthy/carb modified Room service appropriate? Yes; Fluid consistency: Thin  Diet effective now               Inpatient Medications Scheduled Meds: . albuterol  2 puff Inhalation Q6H  . apixaban  5 mg Oral BID  . digoxin  0.125 mg Oral Daily  . famotidine  20 mg Oral BID  . insulin aspart  0-15 Units Subcutaneous TID WC  . loratadine  10 mg Oral Daily  . methylPREDNISolone (SOLU-MEDROL) injection  60 mg Intravenous BID  . metoprolol tartrate  25 mg Oral BID  . midodrine  10 mg Oral TID WC   Continuous Infusions: . amiodarone 30 mg/hr (10/31/19 0232)  . remdesivir 100 mg in NS 100 mL 100 mg (10/31/19 0825)   PRN Meds:.acetaminophen, albuterol, ondansetron (ZOFRAN) IV  Antibiotics  :   Anti-infectives (From admission, onward)   Start     Dose/Rate Route Frequency Ordered Stop   10/30/19 1000  remdesivir 100 mg in sodium chloride 0.9 % 100 mL IVPB     100 mg 200 mL/hr over 30 Minutes Intravenous Daily 10/29/19 0914 11/03/19 0959   10/29/19 1000  remdesivir 200 mg in sodium chloride 0.9% 250 mL IVPB     200 mg 580 mL/hr over 30 Minutes Intravenous Once 10/29/19 0914 10/29/19 1236         Objective:   Vitals:   10/31/19 0830 10/31/19 0850 10/31/19 0945 10/31/19 1013  BP: 105/85     Pulse: (!) 137 70 78 (!) 101  Resp:  (!) 36 (!) 27 (!) 33  Temp:      TempSrc:      SpO2:  91% 95% 95%  Weight:      Height:        SpO2:  95 % O2 Flow Rate (L/min): 30 L/min FiO2 (%): 80 %  Wt Readings from Last 3 Encounters:  10/31/19 123.7 kg  09/19/19 132 kg  02/18/19 127.9 kg     Intake/Output Summary (Last 24 hours) at 10/31/2019 1029 Last data filed at 10/31/2019 0400 Gross per 24 hour  Intake 1875.4 ml  Output --  Net 1875.4 ml     Physical Exam  Awake Alert, No new F.N deficits, Normal affect [place].AT,PERRAL Supple  Neck,No JVD, No cervical lymphadenopathy appriciated.  Symmetrical Chest wall movement, Good air movement bilaterally, few rales iRRR,No Gallops, Rubs or new Murmurs, No Parasternal Heave +ve B.Sounds, Abd Soft, No tenderness, No organomegaly appriciated, No rebound - guarding or rigidity. No Cyanosis, Clubbing or edema, No new Rash or bruise     Data Review:    Recent Labs  Lab 10/28/19 1511 10/28/19 1535 10/29/19 0450 10/30/19 0631 10/31/19 0503  WBC 4.0  --  3.9* 3.8* 8.1  HGB 15.7* 16.3* 14.8 14.7 15.4*  HCT 49.5* 48.0* 46.1* 44.7 48.0*  PLT 206  --  206 283 378  MCV 94.1  --  94.1 92.4 93.8  MCH 29.8  --  30.2 30.4 30.1  MCHC 31.7  --  32.1 32.9 32.1  RDW 12.9  --  12.9 12.9 12.9    Recent Labs  Lab 10/28/19 1511 10/28/19 1515 10/28/19 1535 10/28/19 1917 10/29/19 0450 10/29/19 0956 10/30/19 0631 10/31/19 0503  NA 141  --  140  --  142  --  140 141  K 3.9  --  3.8  --  3.6  --  4.1 4.2  CL 99  --  101  --  103  --  104 102  CO2 25  --   --   --  27  --  25 24  GLUCOSE 235*  --  227*  --  170*  --  196* 239*  BUN 24*  --  28*  --  22  --  24* 21  CREATININE 1.65*  --  1.40*  --  1.16*  --  1.10* 1.09*  CALCIUM 8.6*  --   --   --  8.0*  --  8.3* 8.5*  AST  --   --   --   --   --  52* 45* 35  ALT  --   --   --   --   --  33 29 30  ALKPHOS  --   --   --   --   --  41 40 50  BILITOT  --   --   --   --   --  1.0 0.6 0.8  ALBUMIN  --   --   --   --   --  2.6* 2.5* 2.5*  MG  --   --   --   --   --   --  2.1 2.2  CRP  --   --   --   --   --  12.0* 12.1* 8.3*  DDIMER  --   --   --   --   --  0.84* 0.74* 1.18*  PROCALCITON  --   --   --   --   --  <0.10 <0.10 <0.10  TSH  --   --   --  0.725  --   --   --   --   HGBA1C  --   --   --  6.3*  --   --   --   --  BNP  --  761.9*  --   --   --   --  237.8* 437.4*    Recent Labs  Lab 10/28/19 1515 10/28/19 1618 10/29/19 0956 10/30/19 0631 10/31/19 0503  CRP  --   --  12.0* 12.1* 8.3*  DDIMER  --   --  0.84* 0.74*  1.18*  BNP 761.9*  --   --  237.8* 437.4*  PROCALCITON  --   --  <0.10 <0.10 <0.10  SARSCOV2NAA  --  POSITIVE*  --   --   --     ------------------------------------------------------------------------------------------------------------------ Recent Labs    10/29/19 0450  CHOL 133  HDL 26*  LDLCALC 90  TRIG 84  CHOLHDL 5.1    Lab Results  Component Value Date   HGBA1C 6.3 (H) 10/28/2019   ------------------------------------------------------------------------------------------------------------------ Recent Labs    10/28/19 1917  TSH 0.725   ------------------------------------------------------------------------------------------------------------------ Recent Labs    10/29/19 0956 10/30/19 0631  FERRITIN 1,387* 1,344*    Coagulation profile No results for input(s): INR, PROTIME in the last 168 hours.  Recent Labs    10/30/19 0631 10/31/19 0503  DDIMER 0.74* 1.18*    Cardiac Enzymes No results for input(s): CKMB, TROPONINI, MYOGLOBIN in the last 168 hours.  Invalid input(s): CK ------------------------------------------------------------------------------------------------------------------    Component Value Date/Time   BNP 437.4 (H) 10/31/2019 0503    Micro Results Recent Results (from the past 240 hour(s))  SARS CORONAVIRUS 2 (TAT 6-24 HRS) Nasopharyngeal Nasopharyngeal Swab     Status: Abnormal   Collection Time: 10/28/19  4:18 PM   Specimen: Nasopharyngeal Swab  Result Value Ref Range Status   SARS Coronavirus 2 POSITIVE (A) NEGATIVE Final    Comment: RESULT CALLED TO, READ BACK BY AND VERIFIED WITH: M. RUGGIERO,RN 0020 10/29/2019 T. TYSOR (NOTE) SARS-CoV-2 target nucleic acids are DETECTED. The SARS-CoV-2 RNA is generally detectable in upper and lower respiratory specimens during the acute phase of infection. Positive results are indicative of the presence of SARS-CoV-2 RNA. Clinical correlation with patient history and other diagnostic  information is  necessary to determine patient infection status. Positive results do not rule out bacterial infection or co-infection with other viruses.  The expected result is Negative. Fact Sheet for Patients: SugarRoll.be Fact Sheet for Healthcare Providers: https://www.woods-mathews.com/ This test is not yet approved or cleared by the [place] FDA and  has been authorized for detection and/or diagnosis of SARS-CoV-2 by FDA under an Emergency Use Authorization (EUA). This EUA will remain  in effect (meaning this test can be used) for  the duration of the COVID-19 declaration under Section 564(b)(1) of the Act, 21 U.S.C. section 360bbb-3(b)(1), unless the authorization is terminated or revoked sooner. Performed at [facility], [place] [address], [place], [place] [address]     Radiology Reports DG Chest [place] 1 View  Result Date: 10/31/2019 CLINICAL DATA:  Shortness of breath. COVID-19 viral pneumonia. EXAM: PORTABLE CHEST 1 VIEW COMPARISON:  02/21/2020 FINDINGS: Heart size remains within normal limits. Bilateral diffuse heterogeneous airspace opacity shows no significant change. No evidence of pneumothorax or pleural effusion. IMPRESSION: No significant change in diffuse heterogeneous airspace disease. Electronically Signed   By: Marlaine Hind M.D.   On: 10/31/2019 08:16   DG Chest Port 1 View  Result Date: 10/30/2019 CLINICAL DATA:  COVID positive. EXAM: PORTABLE CHEST 1 VIEW COMPARISON:  Chest x-ray dated October 28, 2019. FINDINGS: Stable cardiomediastinal silhouette. Normal pulmonary vascularity. Low lung volumes. Patchy interstitial and airspace opacities throughout both lungs, mildly worsened  at the left lung base. No pleural effusion or pneumothorax. No acute osseous abnormality. IMPRESSION: 1. Multifocal pneumonia, mildly worsened at the left lung base. Electronically Signed   By: Titus Dubin M.D.   On: 10/30/2019 08:26   DG  Chest Portable 1 View  Result Date: 10/28/2019 CLINICAL DATA:  Dyspnea with exertion. EXAM: PORTABLE CHEST 1 VIEW COMPARISON:  February 28, 2017. FINDINGS: Stable cardiomediastinal silhouette. Central pulmonary vascular congestion is noted. Bilateral lung opacities are noted which may represent edema or possibly multifocal pneumonia. Atherosclerosis of thoracic aorta is noted. No pneumothorax or pleural effusion is noted. Bony thorax is unremarkable. IMPRESSION: Aortic atherosclerosis. Central pulmonary vascular congestion is noted. Bilateral lung opacities are noted which may represent edema or possibly multifocal pneumonia. Aortic Atherosclerosis (ICD10-I70.0). Electronically Signed   By: Marijo Conception M.D.   On: 10/28/2019 15:42   ECHOCARDIOGRAM COMPLETE  Result Date: 10/29/2019    ECHOCARDIOGRAM REPORT   Patient Name:   Julia Mcfarland Date of Exam: 10/29/2019 Medical Rec #:  [id number]      Height:       61.0 in Accession #:    [id number]     Weight:       270.0 lb Date of Birth:  22-Aug-1945      BSA:          2.146 m Patient Age:    74 years       BP:           136/73 mmHg Patient Gender: F              HR:           135 bpm. Exam Location:  Inpatient Procedure: 2D Echo, Cardiac Doppler and Color Doppler Indications:    I48.0 Paroxysmal atrial fibrillation  History:        Patient has no prior history of Echocardiogram examinations.                 COVID-19 Positive.  Sonographer:    Jonelle Sidle Dance Referring Phys: [place]  1. Normal LV function; mild LAE.  2. Left ventricular ejection fraction, by estimation, is 55 to 60%. The left ventricle has normal function. The left ventricle has no regional wall motion abnormalities. Left ventricular diastolic function could not be evaluated.  3. Right ventricular systolic function is normal. The right ventricular size is normal. Tricuspid regurgitation signal is inadequate for assessing PA pressure.  4. Left atrial size was mildly  dilated.  5. The mitral valve is normal in structure. Trivial mitral valve regurgitation. No evidence of mitral stenosis.  6. The aortic valve is tricuspid. Aortic valve regurgitation is not visualized. No aortic stenosis is present.  7. The inferior vena cava is normal in size with greater than 50% respiratory variability, suggesting right atrial pressure of 3 mmHg. FINDINGS  Left Ventricle: Left ventricular ejection fraction, by estimation, is 55 to 60%. The left ventricle has normal function. The left ventricle has no regional wall motion abnormalities. The left ventricular internal cavity size was normal in size. There is  no left ventricular hypertrophy. Left ventricular diastolic function could not be evaluated due to atrial fibrillation. Left ventricular diastolic function could not be evaluated. Right Ventricle: The right ventricular size is normal. Right ventricular systolic function is normal. Tricuspid regurgitation signal is inadequate for assessing PA pressure. Left Atrium: Left atrial size was mildly dilated. Right Atrium: Right atrial size was normal in size. Pericardium: There is no  evidence of pericardial effusion. Mitral Valve: The mitral valve is normal in structure. Normal mobility of the mitral valve leaflets. Trivial mitral valve regurgitation. No evidence of mitral valve stenosis. Tricuspid Valve: The tricuspid valve is normal in structure. Tricuspid valve regurgitation is mild . No evidence of tricuspid stenosis. Aortic Valve: The aortic valve is tricuspid. Aortic valve regurgitation is not visualized. No aortic stenosis is present. Pulmonic Valve: The pulmonic valve was not well visualized. Pulmonic valve regurgitation is trivial. No evidence of pulmonic stenosis. Aorta: The aortic root is normal in size and structure. Venous: The inferior vena cava is normal in size with greater than 50% respiratory variability, suggesting right atrial pressure of 3 mmHg.  Additional Comments: Normal LV  function; mild LAE.  LEFT VENTRICLE PLAX 2D LVIDd:         4.00 cm LVIDs:         2.85 cm LV PW:         1.10 cm LV IVS:        1.13 cm LVOT diam:     1.85 cm LV SV:         40 LV SV Index:   19 LVOT Area:     2.69 cm  RIGHT VENTRICLE             IVC RV Basal diam:  2.82 cm     IVC diam: 1.67 cm RV S prime:     12.20 cm/s TAPSE (M-mode): 0.9 cm LEFT ATRIUM           Index       RIGHT ATRIUM           Index LA diam:      3.20 cm 1.49 cm/m  RA Area:     15.80 cm LA Vol (A2C): 74.0 ml 34.49 ml/m RA Volume:   39.80 ml  18.55 ml/m LA Vol (A4C): 61.6 ml 28.71 ml/m  AORTIC VALVE LVOT Vmax:   122.50 cm/s LVOT Vmean:  66.600 cm/s LVOT VTI:    0.150 m  AORTA Ao Root diam: 3.30 cm Ao Asc diam:  3.20 cm MITRAL VALVE MV Area (PHT): 3.62 cm    SHUNTS MV Decel Time: 210 msec    Systemic VTI:  0.15 m MV E velocity: 53.35 cm/s  Systemic Diam: 1.85 cm Kirk Ruths MD Electronically signed by Kirk Ruths MD Signature Date/Time: 10/29/2019/2:08:19 PM    Final     Time Spent in minutes  30   Lala Lund M.D on 10/31/2019 at 10:29 AM  To page go to [email or web address] - password [email or web address]

## 2019-10-31 NOTE — Consult Note (Signed)
NAME:  Julia Mcfarland, MRN:  [id number], DOB:  September 29, 1945, LOS: 3 ADMISSION DATE:  10/28/2019, CONSULTATION DATE:  10/31/2019 REFERRING MD:  TRH, CHIEF COMPLAINT:  SOB/ hypoxia  Brief History   78 yoF admitted with acute hypoxic respiratory failure secondary to COVID 19 infection with new onset Afib with RVR, AKI, and elevated BNP.  Since hospitalization, her rate control has been difficult to manage due to mild hypotension and tenuous respiratory status now with increasing O2 requirement/ support on HHFNC and worsening dyspnea.  PCCM consulted for further evaluation.   History of present illness    74 year old female with prior history of chronic venous insufficiency, arthritis, and morbid obesity who initially presented on 4/26 with complaints of generalized malaise, palpitations, shortness of breath, chills, and non productive cough for three days.  Patient has had COVID 19 vaccines and patient's daughter is currently hospitalized with COVID.  Found to be in Afib with RVR, with AKI, hypoxic and COVID positive.  Was admitted with Cardiology following.  Was on heparin gtt and was transitioned from cardizem to amiodarone with digoxin due to hypotension and better rate control.  Troponin trend was flat with elevated BNP.  Given lasix on 4/26 and oral dose 4/29.  Echo showed normal LVEF.  Started on remdesivir and decadron.  Her PCT trends have been normal.  CXR showed bilateral infiltrates.  Her oxygen requirement has been increasing with worsening work of breathing, now requiring heated high flow nasal cannula with flow rate at 35L/ 100% FiO2.  ABG 7.511/ 32.5/ 75.5/ 25.8. PCCM consulted for further evaluation and concern of hypoxic respiratory failure.    At bedside, patient was somnolent, had received ativan at [place], but arousable.  RR was improved, around 25-28, with sats 90-93% on HHF 80%/40L.  She was able to tell me her name, but could not tell me why she was here.  Moving all extremities.  HR  stable in 105-115 range.    Past Medical History  Chronic venous insufficiency, arthritis, morbid obesity   Significant Hospital Events   4/26 Admitted   Consults:  [facility]   Procedures:   Significant Diagnostic Tests:  4/27 TTE >> 1. Normal LV function; mild LAE.  2. Left ventricular ejection fraction, by estimation, is 55 to 60%. The  left ventricle has normal function. The left ventricle has no regional  wall motion abnormalities. Left ventricular diastolic function could not  be evaluated.  3. Right ventricular systolic function is normal. The right ventricular  size is normal. Tricuspid regurgitation signal is inadequate for assessing  PA pressure.  4. Left atrial size was mildly dilated.  5. The mitral valve is normal in structure. Trivial mitral valve  regurgitation. No evidence of mitral stenosis.  6. The aortic valve is tricuspid. Aortic valve regurgitation is not  visualized. No aortic stenosis is present.  7. The inferior vena cava is normal in size with greater than 50%  respiratory variability, suggesting right atrial pressure of 3 mmHg.   Micro Data:  4/26 SARS2 >> positive   Antimicrobials:  N/a   4/27 remdesivir >>  Interim history/subjective:    Objective   Blood pressure (!) 166/152, pulse 83, temperature 98.9 F (37.2 C), temperature source Axillary, resp. rate (!) 49, height 5\' 1"  (1.549 m), weight 123.7 kg, SpO2 90 %.    FiO2 (%):  [80 %] 80 %   Intake/Output Summary (Last 24 hours) at 10/31/2019 2239 Last data filed at 10/31/2019 1647 Gross per  24 hour  Intake 910.4 ml  Output --  Net 910.4 ml   Filed Weights   10/30/19 0340 10/30/19 0517 10/31/19 0137  Weight: 121.7 kg 121.7 kg 123.7 kg    Examination: General: obese woman HENT: No acute abnormalities, pupils equal Lungs: bilateral slight wheezes, coarse crackles Cardiovascular: irregular rate, 1+ radial pulses Abdomen: soft, nontender Extremities: 2+ edema Neuro:  somnolent- moves all extremities, no facial droop  Resolved Hospital Problem list    Assessment & Plan:  # Acute hypoxic respiratory failure # COVID-19 Worsening respiratory status, worsening hypoxia and increased work of breathing over the day today.  Appears volume up- but the hospitalist team has had issues with diuresing due to lower random blood pressures.  S/p tociluzumab - place arterial line for closer BP monitoring in the setting of Afib - trial diuresis, hold additional fluids - continue heated high flow, obtain ABG.  If retaining CO2, can consider trialing BIPAP beofre intubation- likely patient would be difficult to come off vent - continue remdesivir and dexamethasone - strict I/Os  # Acute encephalopathy: due to underlying respiratory illness and ativan likely contributing to somnolence - as above- obtain ABG - moving everything, no acute signs of stroke   # Afib with RVR: supposedly new onset this admission - transfer back to heparin gtt - continue amiodarone gtt - hold PO digoxin and PO metoprolol since unable to take oral meds - cardiology on board  # Diabetes: Uncontrolled - increased glucose control  # Asthma:   - currently on dexamethasone - scheduled albuterol nebs + prn   Best practice:  Diet: NPO Pain/Anxiety/Delirium protocol (if indicated): n/a VAP protocol (if indicated): n/a DVT prophylaxis: heparin gtt GI prophylaxis: n/a Glucose control: increased Mobility: n/a Code Status: Full Family Communication: Spoke with husband [name] over the phone at 2330.   Disposition: transfer to ICU 4/29  Labs   CBC: Recent Labs  Lab 10/28/19 1511 10/28/19 1535 10/29/19 0450 10/30/19 0631 10/31/19 0503  WBC 4.0  --  3.9* 3.8* 8.1  HGB 15.7* 16.3* 14.8 14.7 15.4*  HCT 49.5* 48.0* 46.1* 44.7 48.0*  MCV 94.1  --  94.1 92.4 93.8  PLT 206  --  206 283 [id number]    Basic Metabolic Panel: Recent Labs  Lab 10/28/19 1511 10/28/19 1535 10/29/19 0450  10/30/19 0631 10/31/19 0503  NA 141 140 142 140 141  K 3.9 3.8 3.6 4.1 4.2  CL 99 101 103 104 102  CO2 25  --  27 25 24   GLUCOSE 235* 227* 170* 196* 239*  BUN 24* 28* 22 24* 21  CREATININE 1.65* 1.40* 1.16* 1.10* 1.09*  CALCIUM 8.6*  --  8.0* 8.3* 8.5*  MG  --   --   --  2.1 2.2  PHOS  --   --   --  3.9  --    GFR: Estimated Creatinine Clearance: 57.6 mL/min (A) (by C-G formula based on SCr of 1.09 mg/dL (H)). Recent Labs  Lab 10/28/19 1511 10/29/19 0450 10/29/19 0956 10/30/19 0631 10/31/19 0503  PROCALCITON  --   --  <0.10 <0.10 <0.10  WBC 4.0 3.9*  --  3.8* 8.1    Liver Function Tests: Recent Labs  Lab 10/29/19 0956 10/30/19 0631 10/31/19 0503  AST 52* 45* 35  ALT 33 29 30  ALKPHOS 41 40 50  BILITOT 1.0 0.6 0.8  PROT 6.1* 5.9* 6.1*  ALBUMIN 2.6* 2.5* 2.5*   No results for input(s): LIPASE, AMYLASE in the last 168  hours. No results for input(s): AMMONIA in the last 168 hours.  ABG    Component Value Date/Time   PHART 7.511 (H) 10/31/2019 2000   PCO2ART 32.5 10/31/2019 2000   PO2ART 75.5 (L) 10/31/2019 2000   HCO3 25.8 10/31/2019 2000   TCO2 28 10/28/2019 1535   O2SAT 94.8 10/31/2019 2000     Coagulation Profile: No results for input(s): INR, PROTIME in the last 168 hours.  Cardiac Enzymes: No results for input(s): CKTOTAL, CKMB, CKMBINDEX, TROPONINI in the last 168 hours.  HbA1C: Hgb A1c MFr Bld  Date/Time Value Ref Range Status  10/28/2019 07:17 PM 6.3 (H) 4.8 - 5.6 % Final    Comment:    (NOTE) Pre diabetes:          5.7%-6.4% Diabetes:              >6.4% Glycemic control for   <7.0% adults with diabetes   02/18/2019 09:51 AM 5.9 4.6 - 6.5 % Final    Comment:    Glycemic Control Guidelines for People with Diabetes:Non Diabetic:  <6%Goal of Therapy: <7%Additional Action Suggested:  >8%     CBG: Recent Labs  Lab 10/30/19 2118 10/31/19 0800 10/31/19 1152 10/31/19 1720 10/31/19 2237  GLUCAP 323* 244* 264* 292* 260*    Review of  Systems:   Unable to be obtained due to patient being critically ill  Past Medical History  She,  has a past medical history of Allergy, Arthritis, Chicken pox, Chronic venous insufficiency, Degenerative joint disease, Morbid obesity ([place]), Osteoarthritis, and PONV (postoperative nausea and vomiting).   Surgical History    Past Surgical History:  Procedure Laterality Date  . ABDOMINAL HYSTERECTOMY  1988  . CARPAL TUNNEL RELEASE Right   . REPLACEMENT TOTAL KNEE Right 2010  . TOTAL KNEE ARTHROPLASTY Left 03/07/2017   Procedure: LEFT TOTAL KNEE ARTHROPLASTY;  Surgeon: Meredith Pel, MD;  Location: [facility];  Service: Orthopedics;  Laterality: Left;     Social History   reports that she has never smoked. She has never used smokeless tobacco. She reports current alcohol use. She reports that she does not use drugs.   Family History   Her family history includes Arthritis in her mother; Diabetes in her mother and paternal grandmother; Heart disease in her maternal grandfather, mother, and paternal grandfather; Hypertension in her father, mother, and sister; Stroke in her father and sister.   Allergies No Known Allergies   Home Medications  Prior to Admission medications   Medication Sig Start Date End Date Taking? Authorizing Provider  albuterol (VENTOLIN HFA) 108 (90 Base) MCG/ACT inhaler Inhale 2 puffs into the lungs every 6 (six) hours as needed for wheezing or shortness of breath. 09/19/19  Yes [name], NP  Cholecalciferol (VITAMIN D3) 5000 UNITS CAPS Take 5,000 Units by mouth daily.    Yes [provider]  fexofenadine (ALLEGRA ALLERGY) 180 MG tablet Take 1 tablet (180 mg total) by mouth daily. 09/19/19  Yes [name], NP  furosemide (LASIX) 20 MG tablet Take 1 tablet (20 mg total) by mouth daily. 09/19/19  Yes Baity, Coralie Keens, NP  hydroxypropyl methylcellulose / hypromellose (ISOPTO TEARS / GONIOVISC) 2.5 % ophthalmic solution Place 1 drop into both eyes daily as  needed for dry eyes.   Yes [provider]  vitamin B-12 (CYANOCOBALAMIN) 500 MCG tablet Take 500 mcg by mouth daily.   Yes [provider]     Critical care time: 57

## 2019-10-31 NOTE — Progress Notes (Signed)
Pt transported to 32M room 12 without any complications.

## 2019-10-31 NOTE — Progress Notes (Signed)
Took a telemetry call that stated pt. HR was sustaining in the 140's.  Spoke with Ander Purpura, RN who wanted to know what cardiology to page.  This RN called [phone] and spoke with the answering service who will be calling Lauren back at [phone].  Alphonzo Lemmings, RN

## 2019-10-31 NOTE — Significant Event (Addendum)
Rapid Response Event Note  Overview: Called as a second set of eyes because pt says she "doesn't feel right."  Initial Focused Assessment: Pt laying in bed, alert and oriented, saying "something is wrong." She does have a mild WOB and accessory muscle use. She denies chest pain but does c/o headache, SOB, and lightheadness/dizziness. She is very irritable and agitated. Lungs diminished t/o. Skin warm and dry. HR-140s(Afib), BP-120/36, RR-50, SpO2 89-91% on 30L 80% HHFNC.   Interventions: Flow on HHFNC increased to 35L 80% to help with SOB PCXR-Likely no significant interval change in fairly extensive bilateral airspace disease and consolidations allowing for patient rotation ABG-7.51/32.5/75.5/25.8 Ativan 0.5mg  IV x1 Plan of Care (if not transferred): Pt resting after ativan but is still very tachypneic and labored. HR down to 100s, SpO2-87 to 91% on 35L HHFNC. PCCM being consulted.  2100-SBP-60s-500cc NS bolus ordered.   2330-Pt transported on 15L HFNC and NRB to 3M12.  Event Summary:  Bodenheimer, NP called PTA RRT and at 2035-will consult PCCM.   Called: 1908 Arrived: 1915 Ended:  Dillard Essex

## 2019-10-31 NOTE — Evaluation (Addendum)
Physical Therapy Evaluation Patient Details Name: HALENE VANVICKLE MRN: [id number] DOB: 17-Jul-1945 Today's Date: 10/31/2019   History of Present Illness  74 year old female admitted 10/28/19 with fatigue, dyspnea, palpitations. She was found to be in Afib with RVR, HR 172 bpm. No structural heart abnormalities. Cardiology consulted. Diltiazem infusion with HR 130s-140s bpm. High-sensitivity troponin mildly elevated. Blood pressures soft. Patient also found to be COVID positive. CXR: pulmonary vascular congestion. No on IV amio and oral digoxin for rate control. On IV Heparin. Rapid response 10/29/19 PM but no interventions given. PMH: chronic venous insufficiency, degenerative joint disease/arthritis status post bilateral knee replacements, seasonal allergies, and morbid obesity    Clinical Impression  Nurse cleared patient to participate in PT evaluation to tolerance. Patient presents with impaired overall strength and balance, decreased ability to follow directions, and decreased activity tolerance. She recently transitioned to heated high flow (30L 80%) today per discussion with RT during PT evaluation. Difficulty getting accurate oxygen saturation despite switching from ear probe to new finger probe. Oxygen saturation reading from 85-90% at rest. Down to 795 (? Accuracy of reading) with transfer back to bed. Patient required one person assistance, unsteady for transfer back to bed as patient noting fatigue and requesting transfer back to bed. Once back to bed BP noted to be low at 82/68. Nurse called in to assess patient and BP within a few minutes normalizing to 100/77. Patient BP noted to be overall low and HR high during admission per chart review and discussion with nurse. Upon further questioning, patient noted that she was lightheaded upon standing during transfer. Recommend continued skilled PT services and pending mobility progress while in the hospital, discharge to SNF for short term  rehabilitation.     Follow Up Recommendations SNF    Equipment Recommendations  Other (comment)(TBD)    Recommendations for Other Services       Precautions / Restrictions Precautions Precautions: Fall;Other (comment) Precaution Comments: monitor oxygen saturation, target HR 65-105 Restrictions Weight Bearing Restrictions: No      Mobility  Bed Mobility Overal bed mobility: Needs Assistance Bed Mobility: Sit to Supine       Sit to supine: Min assist;Mod assist      Transfers Overall transfer level: Needs assistance Equipment used: None Transfers: Sit to/from Stand Sit to Stand: Min assist            Ambulation/Gait Ambulation/Gait assistance: Min Web designer (Feet): 4 Feet Assistive device: None(bilat HHA) Gait Pattern/deviations: Decreased step length - right;Decreased step length - left;Staggering left;Staggering right     General Gait Details: Bilat HHA with PT standing facing patient to assist to bed a few feet away from chair. Difficulty following directions. Unsteady compared to when nursing assisted patient to chair earlier and patient was stand by assistance.      Balance Overall balance assessment: Needs assistance Sitting-balance support: Feet supported Sitting balance-Leahy Scale: Fair     Standing balance support: Bilateral upper extremity supported Standing balance-Leahy Scale: Poor Standing balance comment: Unsteady requiring assistance       Pertinent Vitals/Pain Pain Assessment: 0-10 Pain Score: 3  Pain Location: L ear/eye area when patient points Pain Descriptors / Indicators: Pressure Pain Intervention(s): Monitored during session;Limited activity within patient's tolerance    Home Living Family/patient expects to be discharged to:: Private residence Living Arrangements: Spouse/significant other Available Help at Discharge: Available PRN/intermittently Type of Home: House Home Access: Stairs to enter Entrance  Stairs-Rails: Can reach both Entrance Stairs-Number of Steps: 2  at front door(side entrance or deck) Home Layout: One level Home Equipment: Cooper - single point Additional Comments: Need to clarify as patient with difficulty describing home set-up and DME.Husband is a truck driver works during the day. Patient does not work.    Prior Function Level of Independence: Independent;Independent with assistive device(s)         Comments: recent use of SPC due to COVID, independent ADLs, question who does IADLs        Extremity/Trunk Assessment        Lower Extremity Assessment Lower Extremity Assessment: Generalized weakness;RLE deficits/detail;LLE deficits/detail RLE Deficits / Details: grossly 3-/5 based on functional mobility LLE Deficits / Details: grossly 3-/5 based on functional mobility       Communication   Communication: Expressive difficulties  Cognition Arousal/Alertness: Awake/alert   Overall Cognitive Status: No family/caregiver present to determine baseline cognitive functioning   General Comments: When asked if patient feels her thining is not baseline, she confirms she does feel tired and did not sleep well last night.      General Comments General comments (skin integrity, edema, etc.): BP once back to bed 82/68. Retaken after a few minutes and BP 100/77. HR in one teens to one twenties. O2 saturation with difficulty getting good pleth despite ear vs finger probe. RR in 55s        Assessment/Plan    PT Assessment Patient needs continued PT services  PT Problem List Decreased strength;Decreased activity tolerance;Decreased balance;Decreased mobility;Cardiopulmonary status limiting activity       PT Treatment Interventions DME instruction;Gait training;Stair training;Functional mobility training;Therapeutic activities;Therapeutic exercise;Balance training;Patient/family education    PT Goals (Current goals can be found in the Care Plan section)  Acute  Rehab PT Goals Time For Goal Achievement: 11/13/19 Potential to Achieve Goals: Good    Frequency Min 2X/week   Barriers to discharge   Question if husband will be home with patient upon discharge       AM-PAC PT "6 Clicks" Mobility  Outcome Measure Help needed turning from your back to your side while in a flat bed without using bedrails?: A Little Help needed moving from lying on your back to sitting on the side of a flat bed without using bedrails?: A Little Help needed moving to and from a bed to a chair (including a wheelchair)?: A Little Help needed standing up from a chair using your arms (e.g., wheelchair or bedside chair)?: A Little Help needed to walk in hospital room?: A Little Help needed climbing 3-5 steps with a railing? : A Lot 6 Click Score: 17    End of Session Equipment Utilized During Treatment: Oxygen Activity Tolerance: Patient limited by fatigue;Treatment limited secondary to medical complications (Comment) Patient left: in bed;with call bell/phone within reach;with bed alarm set Nurse Communication: Mobility status;Other (comment)(BP reading) PT Visit Diagnosis: Unsteadiness on feet (R26.81);Other abnormalities of gait and mobility (R26.89)    Time: [id number] PT Time Calculation (min) (ACUTE ONLY): 51 min   Charges:   PT Evaluation $PT Eval Moderate Complexity: 1 Mod          Birdie Hopes, PT, DPT Acute Rehab [phone] office    Birdie Hopes 10/31/2019, 4:28 PM

## 2019-10-31 NOTE — Progress Notes (Signed)
[id number]- Bedside report received from [place], [place].  At the bedside, patient's HR sustaining in the 150s, HR in 50s, O2 in high 80s, and patient states, " I do not feel right."  This nurse calls the rapid nurse and gets a set of vital signs.  See chart.  Heated high flow at 30L/80% Amnio drip in place.  This nurse contacts rapid response nurse, night coverage MD, charge nurse.  Delcine Wynetta Emery, RN agreed to contact on call Cardiology.    1930- Rapid Nurse to bedside.  MD instructed to increase HHFNC to 35L/80L.  See chart for new orders.  Awaiting call back from cardiology.

## 2019-10-31 NOTE — Progress Notes (Signed)
Remains severely dyspneic.  Oxygen saturation has been 95% on (very) high flow oxygen.  Blood pressure remains low despite midodrine. Continues to have episodes of fairly significant RVR, but after the additional dose of intravenous digoxin there is definitely an improving trend with resting ventricular rates now in the 110/120s.  Low blood pressure continues to limit additional rate control medications. Her clinical status remains very tenuous, but hopefully there are some signs of stabilization.

## 2019-10-31 NOTE — Progress Notes (Signed)
ANTICOAGULATION CONSULT NOTE - Initial Consult  Pharmacy Consult for heparin Indication: atrial fibrillation  No Known Allergies  Patient Measurements: Height: 5\' 1"  (154.9 cm) Weight: 123.7 kg (272 lb 11.3 oz) IBW/kg (Calculated) : 47.8 Heparin Dosing Weight: 80kg  Vital Signs: Temp: 98.6 F (37 C) (04/29 2250) Temp Source: Axillary (04/29 2250) BP: 119/82 (04/29 2250) Pulse Rate: 75 (04/29 2250)  Labs: Recent Labs    10/29/19 0450 10/29/19 0450 10/29/19 1552 10/30/19 0631 10/31/19 0503  HGB 14.8   < >  --  14.7 15.4*  HCT 46.1*  --   --  44.7 48.0*  PLT 206  --   --  283 378  HEPARINUNFRC 0.72*  --  0.85* 0.63  --   CREATININE 1.16*  --   --  1.10* 1.09*   < > = values in this interval not displayed.    Estimated Creatinine Clearance: 57.6 mL/min (A) (by C-G formula based on SCr of 1.09 mg/dL (H)).   Medical History: Past Medical History:  Diagnosis Date  . Allergy   . Arthritis   . Chicken pox   . Chronic venous insufficiency    a. Uses furosemide prn.  . Degenerative joint disease   . Morbid obesity ([place])   . Osteoarthritis   . PONV (postoperative nausea and vomiting)     Assessment: 74yo female started on heparin for Afib then transitioned to Eliquis, now with worsening dyspnea on Covid unit, unable to take PO, to transition back to heparin; last dose of Eliquis 4/29 at 0824.  Goal of Therapy:  Heparin level 0.3-0.7 units/ml aPTT 66-102 seconds Monitor platelets by anticoagulation protocol: Yes   Plan:  Will resume heparin at previously therapeutic rate of 900 units/hr and monitor heparin levels and CBC.  Wynona Neat, PharmD, BCPS  10/31/2019,11:27 PM

## 2019-11-01 ENCOUNTER — Inpatient Hospital Stay (HOSPITAL_COMMUNITY): Payer: Medicare HMO

## 2019-11-01 DIAGNOSIS — J9601 Acute respiratory failure with hypoxia: Secondary | ICD-10-CM | POA: Diagnosis not present

## 2019-11-01 DIAGNOSIS — I4891 Unspecified atrial fibrillation: Secondary | ICD-10-CM | POA: Diagnosis not present

## 2019-11-01 LAB — COMPREHENSIVE METABOLIC PANEL
ALT: 34 U/L (ref 0–44)
AST: 46 U/L — ABNORMAL HIGH (ref 15–41)
Albumin: 2.6 g/dL — ABNORMAL LOW (ref 3.5–5.0)
Alkaline Phosphatase: 60 U/L (ref 38–126)
Anion gap: 12 (ref 5–15)
BUN: 27 mg/dL — ABNORMAL HIGH (ref 8–23)
CO2: 27 mmol/L (ref 22–32)
Calcium: 8.5 mg/dL — ABNORMAL LOW (ref 8.9–10.3)
Chloride: 105 mmol/L (ref 98–111)
Creatinine, Ser: 1.19 mg/dL — ABNORMAL HIGH (ref 0.44–1.00)
GFR calc Af Amer: 53 mL/min — ABNORMAL LOW (ref >60)
GFR calc non Af Amer: 46 mL/min — ABNORMAL LOW (ref >60)
Glucose, Bld: 229 mg/dL — ABNORMAL HIGH (ref 70–99)
Potassium: 4.1 mmol/L (ref 3.5–5.1)
Sodium: 144 mmol/L (ref 135–145)
Total Bilirubin: 1.2 mg/dL (ref 0.3–1.2)
Total Protein: 6.1 g/dL — ABNORMAL LOW (ref 6.5–8.1)

## 2019-11-01 LAB — POCT I-STAT 7, (LYTES, BLD GAS, ICA,H+H)
Acid-Base Excess: 6 mmol/L — ABNORMAL HIGH (ref 0.0–2.0)
Bicarbonate: 27.9 mmol/L (ref 20.0–28.0)
Calcium, Ion: 1.12 mmol/L — ABNORMAL LOW (ref 1.15–1.40)
HCT: 47 % — ABNORMAL HIGH (ref 36.0–46.0)
Hemoglobin: 16 g/dL — ABNORMAL HIGH (ref 12.0–15.0)
O2 Saturation: 93 %
Patient temperature: 98.6
Potassium: 4 mmol/L (ref 3.5–5.1)
Sodium: 143 mmol/L (ref 135–145)
TCO2: 29 mmol/L (ref 22–32)
pCO2 arterial: 32.4 mmHg (ref 32.0–48.0)
pH, Arterial: 7.543 — ABNORMAL HIGH (ref 7.350–7.450)
pO2, Arterial: 57 mmHg — ABNORMAL LOW (ref 83.0–108.0)

## 2019-11-01 LAB — PROCALCITONIN: Procalcitonin: 0.15 ng/mL

## 2019-11-01 LAB — BRAIN NATRIURETIC PEPTIDE: B Natriuretic Peptide: 269.1 pg/mL — ABNORMAL HIGH (ref 0.0–100.0)

## 2019-11-01 LAB — C-REACTIVE PROTEIN: CRP: 4.7 mg/dL — ABNORMAL HIGH (ref <1.0)

## 2019-11-01 LAB — GLUCOSE, CAPILLARY
Glucose-Capillary: 227 mg/dL — ABNORMAL HIGH (ref 70–99)
Glucose-Capillary: 225 mg/dL — ABNORMAL HIGH (ref 70–99)
Glucose-Capillary: 231 mg/dL — ABNORMAL HIGH (ref 70–99)
Glucose-Capillary: 301 mg/dL — ABNORMAL HIGH (ref 70–99)
Glucose-Capillary: 277 mg/dL — ABNORMAL HIGH (ref 70–99)
Glucose-Capillary: 149 mg/dL — ABNORMAL HIGH (ref 70–99)

## 2019-11-01 LAB — MAGNESIUM: Magnesium: 2.2 mg/dL (ref 1.7–2.4)

## 2019-11-01 LAB — D-DIMER, QUANTITATIVE: D-Dimer, Quant: 13.89 ug/mL-FEU — ABNORMAL HIGH (ref 0.00–0.50)

## 2019-11-01 MED ORDER — METOLAZONE 5 MG PO TABS
5.0000 mg | ORAL_TABLET | Freq: Once | ORAL | Status: AC
  Administered 2019-11-01: 5 mg via ORAL
  Filled 2019-11-01: qty 1

## 2019-11-01 MED ORDER — HALOPERIDOL LACTATE 5 MG/ML IJ SOLN: 2.0000 mg | Freq: Four times a day (QID) | INTRAMUSCULAR | Status: DC | PRN

## 2019-11-01 MED ORDER — METOPROLOL TARTRATE 5 MG/5ML IV SOLN
5.0000 mg | Freq: Four times a day (QID) | INTRAVENOUS | Status: DC | PRN
  Administered 2019-11-01 – 2019-11-05 (×3): 5 mg via INTRAVENOUS
  Filled 2019-11-01 (×6): qty 5

## 2019-11-01 MED ORDER — METOPROLOL TARTRATE 5 MG/5ML IV SOLN
5.0000 mg | Freq: Once | INTRAVENOUS | Status: AC
  Administered 2019-11-01: 5 mg via INTRAVENOUS
  Filled 2019-11-01: qty 5

## 2019-11-01 MED ORDER — ALBUTEROL SULFATE (2.5 MG/3ML) 0.083% IN NEBU: 2.5000 mg | INHALATION_SOLUTION | RESPIRATORY_TRACT | Status: DC

## 2019-11-01 MED ORDER — METOPROLOL TARTRATE 5 MG/5ML IV SOLN
5.0000 mg | Freq: Four times a day (QID) | INTRAVENOUS | Status: DC
  Administered 2019-11-01: 5 mg via INTRAVENOUS
  Filled 2019-11-01: qty 5

## 2019-11-01 MED ORDER — METOPROLOL TARTRATE 50 MG PO TABS
50.0000 mg | ORAL_TABLET | Freq: Two times a day (BID) | ORAL | Status: DC
  Administered 2019-11-01 – 2019-11-02 (×4): 50 mg via ORAL
  Filled 2019-11-01 (×5): qty 1

## 2019-11-01 MED ORDER — INSULIN DETEMIR 100 UNIT/ML ~~LOC~~ SOLN
15.0000 [IU] | Freq: Every day | SUBCUTANEOUS | Status: DC
  Administered 2019-11-01: 15 [IU] via SUBCUTANEOUS
  Filled 2019-11-01 (×2): qty 0.15

## 2019-11-01 MED ORDER — QUETIAPINE FUMARATE 25 MG PO TABS
25.0000 mg | ORAL_TABLET | Freq: Every day | ORAL | Status: DC
  Administered 2019-11-01 – 2019-11-12 (×10): 25 mg via ORAL
  Filled 2019-11-01 (×11): qty 1

## 2019-11-01 MED ORDER — APIXABAN 5 MG PO TABS: 5.0000 mg | ORAL_TABLET | Freq: Two times a day (BID) | ORAL | Status: DC

## 2019-11-01 MED ORDER — QUETIAPINE FUMARATE 25 MG PO TABS: 25.0000 mg | ORAL_TABLET | Freq: Two times a day (BID) | ORAL | Status: DC

## 2019-11-01 MED ORDER — DILTIAZEM HCL 90 MG PO TABS
90.0000 mg | ORAL_TABLET | Freq: Three times a day (TID) | ORAL | Status: DC
  Filled 2019-11-01 (×2): qty 1

## 2019-11-01 MED ORDER — ALBUTEROL SULFATE HFA 108 (90 BASE) MCG/ACT IN AERS
1.0000 | INHALATION_SPRAY | RESPIRATORY_TRACT | Status: DC | PRN
  Filled 2019-11-01: qty 6.7

## 2019-11-01 MED ORDER — APIXABAN 5 MG PO TABS
5.0000 mg | ORAL_TABLET | Freq: Two times a day (BID) | ORAL | Status: DC
  Administered 2019-11-01 (×2): 5 mg via ORAL
  Filled 2019-11-01 (×2): qty 1

## 2019-11-01 MED ORDER — HALOPERIDOL LACTATE 5 MG/ML IJ SOLN
2.0000 mg | Freq: Once | INTRAMUSCULAR | Status: AC
  Administered 2019-11-01: 2 mg via INTRAVENOUS
  Filled 2019-11-01: qty 1

## 2019-11-01 MED ORDER — METHYLPREDNISOLONE SODIUM SUCC 40 MG IJ SOLR
40.0000 mg | Freq: Every day | INTRAMUSCULAR | Status: DC
  Administered 2019-11-01: 40 mg via INTRAVENOUS
  Filled 2019-11-01: qty 1

## 2019-11-01 MED ORDER — FUROSEMIDE 10 MG/ML IJ SOLN
40.0000 mg | Freq: Once | INTRAMUSCULAR | Status: AC
  Administered 2019-11-01: 10:00:00 40 mg via INTRAVENOUS
  Filled 2019-11-01: qty 4

## 2019-11-01 NOTE — Progress Notes (Signed)
BiPAP not placed at this time per Dr. Cleotilde Neer. RT will continue to monitor pt.

## 2019-11-01 NOTE — Progress Notes (Signed)
sats 83% on 25L 80% heated [place].  Increased to 35L 80% and o2 sats improved 96%.  Pt appears in no distress, denies SOB or pain.  Will continue to monitor.

## 2019-11-01 NOTE — Progress Notes (Signed)
Patient was seen by critical care physician and order to transfer to ICU was given.  Report given at the bedside to Jenny Reichmann, RN which was the receiving nurse.    Called patient's husband and notified him that the patient was being transferred to the ICU for closer monitoring.  All questions were answered.  All belongings were sent with the patient to new room 3M12.

## 2019-11-01 NOTE — Progress Notes (Signed)
This note also relates to the following rows which could not be included: Pulse Rate - Cannot attach notes to unvalidated device data Resp - Cannot attach notes to unvalidated device data SpO2 - Cannot attach notes to unvalidated device data  RN giving pt bath at this time

## 2019-11-01 NOTE — Progress Notes (Signed)
A-line positional and patient continues to try to move arm. Cuff on leg is also applied but patient is not compliant with that as well, as she moves leg stating that "it hurts" every time cuff inflates, therefore resulting in inaccurate blood pressures.  Will continue to attempt to obtain accurate blood pressures through out shift.

## 2019-11-01 NOTE — TOC Benefit Eligibility Note (Signed)
Transition of Care [facility]) Benefit Eligibility Note    Patient Details  Name: Julia Mcfarland MRN: [id number] Date of Birth: 03/22/1946   Medication/Dose: Arne [place]  5 MG BID  Covered?: Yes  Tier: 3 Drug  Prescription Coverage Preferred Pharmacy: [facility]  Spoke with Person/Company/Phone Number:: NADIA   @  [employer]  ER # [phone]  Co-Pay: $ 47.00  Prior Approval: No  Deductible: Met       Memory Argue Phone Number: 11/01/2019, 2:39 PM

## 2019-11-01 NOTE — Progress Notes (Signed)
[place] Progress Note Patient Name: Julia Mcfarland DOB: [birth year] MRN: [id number]   Date of Service  11/01/2019  HPI/Events of Note  Agitation - Patient pulling off HFNC oxygen. Request for bilateral wrist restraints.   eICU Interventions  Will order: 1. Bilateral soft wrist restraints X 6 hours.  2. Will request that ground team evaluate patient to be sure that she does not require intubation.      Intervention Category Major Interventions: Delirium, psychosis, severe agitation - evaluation and management  Prestina Raigoza Eugene 11/01/2019, 1:53 AM

## 2019-11-01 NOTE — Progress Notes (Signed)
PROGRESS NOTE                                                                                                                                                                                                             [name], is a 74 y.o. female, DOB - October 04, 1945, [id number]  Admit date - 10/28/2019   Admitting Physician Sanda Klein, MD  Outpatient Primary MD for the patient is Baity, Coralie Keens, NP  LOS - 4  Chief Complaint  Patient presents with  . Tachycardia       Brief Narrative  - Julia Mcfarland is a 74 y.o. female with medical history significant of chronic venous insufficiency, arthritis, and morbid obesity presented complaints of palpitations, generalized malaise, and shortness of breath over the last 3 days.  Associated symptoms included chills, generalized weakness, and a nonproductive cough.  Upon admission she was found to be in A. fib with RVR with heart rates into the 170s.     She was admitted by cardiology team on 26 April subsequently found to have COVID-19 pneumonia and hospitalist team was consulted.   Subjective:   Patient in bed, denies headache, no chest - Abd pain, no SOB   Assessment  & Plan :     1.  Acute hypoxic respiratory failure due to [id number] pneumonia complicated by pneumomediastinum.  She was initially treated for the first 2 to 3 days of hospitalization with IV steroids and remdesivir, however she continued to decline, she was transferred to my care on 10/30/2018 when she promptly received Actemra however by that time she had already progressed to heated high flow.  She seems to have overall stabilized with stabilization of inflammatory markers.  She was up to 40 L heated high flow currently she is on 25 L heated high flow with FiO2 of 40%.  She has developed a small pneumomediastinum on 10/31/2019 which will be closely monitored, will place her on clear liquids and monitor closely.  No signs of bacterial  infection or esophageal rupture.  No chest or abdominal pain.  Overall extremely tenuous.  Monitor in ICU for now.      Recent Labs  Lab 10/28/19 1511 10/28/19 1511 10/28/19 1515 10/28/19 1535 10/28/19 1535 10/28/19 1917 10/29/19 0450 10/29/19 0956 10/30/19 0631 10/31/19 0503 11/01/19 0044 11/01/19 0500  NA 141   < >  --  140   < >  --  142  --  140 141 143 144  K 3.9   < >  --  3.8   < >  --  3.6  --  4.1 4.2 4.0 4.1  CL 99   < >  --  101  --   --  103  --  104 102  --  105  CO2 25  --   --   --   --   --  27  --  25 24  --  27  GLUCOSE 235*   < >  --  227*  --   --  170*  --  196* 239*  --  229*  BUN 24*   < >  --  28*  --   --  22  --  24* 21  --  27*  CREATININE 1.65*   < >  --  1.40*  --   --  1.16*  --  1.10* 1.09*  --  1.19*  CALCIUM 8.6*  --   --   --   --   --  8.0*  --  8.3* 8.5*  --  8.5*  AST  --   --   --   --   --   --   --  52* 45* 35  --  46*  ALT  --   --   --   --   --   --   --  33 29 30  --  34  ALKPHOS  --   --   --   --   --   --   --  41 40 50  --  60  BILITOT  --   --   --   --   --   --   --  1.0 0.6 0.8  --  1.2  ALBUMIN  --   --   --   --   --   --   --  2.6* 2.5* 2.5*  --  2.6*  MG  --   --   --   --   --   --   --   --  2.1 2.2  --  2.2  CRP  --   --   --   --   --   --   --  12.0* 12.1* 8.3*  --   --   DDIMER  --   --   --   --   --   --   --  0.84* 0.74* 1.18*  --  13.89*  PROCALCITON  --   --   --   --   --   --   --  <0.10 <0.10 <0.10  --  0.15  TSH  --   --   --   --   --  0.725  --   --   --   --   --   --   HGBA1C  --   --   --   --   --  6.3*  --   --   --   --   --   --   BNP  --   --  761.9*  --   --   --   --   --  237.8* 437.4*  --  269.1*   < > = values in this interval not displayed.    Recent Labs  Lab 10/28/19 1515 10/28/19 1618 10/29/19 0956 10/30/19 0631 10/31/19 0503 11/01/19 0500  CRP  --   --  12.0* 12.1* 8.3*  --   DDIMER  --   --  0.84* 0.74* 1.18* 13.89*  BNP 761.9*  --   --  237.8* 437.4* 269.1*  PROCALCITON   --   --  <0.10 <0.10 <0.10 0.15  SARSCOV2NAA  --  POSITIVE*  --   --   --   --       2.  Paroxysmal A. fib RVR.  Mali vas 2 score of greater than 3.  Cardiology on board, currently receiving IV amiodarone, also on digoxin, heart rate still high, blood pressure has improved hence will add scheduled oral metoprolol along with as needed IV Lopressor.  Stable TSH and echocardiogram with EF of 60%.  Mild non-ACS pattern flat trend of high-sensitivity troponin likely due to demand ischemia from RVR.  Chest pain-free no acute issues.  Cardiology following Case discussed with Dr. Sallyanne Kuster cardiologist on 10/31/2019.  3.  Obesity with BMI of 51.  Follow with PCP for weight loss.  4.  AKI.  Currently seems to have resolved will monitor closely.  5.  Metabolic encephalopathy.  Supportive care, nighttime Seroquel.  [place] if needed.  Monitor.    6. Mild Acute on Chronic Diastolic CHF - rate control, diuresis if blood pressure is able to tolerate.  7. DM type II.  Lantus plus sliding scale monitor and adjust.  Steroid dose being tapered.  Lab Results  Component Value Date   HGBA1C 6.3 (H) 10/28/2019   CBG (last 3)  Recent Labs    10/31/19 2341 11/01/19 0349 11/01/19 0740  GLUCAP 243* 227* 225*      Family Communication  : Husband [name] [phone] on 10/30/2019, 10/31/19, 11/01/19 husband clearly explained that Julia Mcfarland's situation remains quite critical.  Code Status :  Full  Disposition Plan  : Stay inpatient in the hospital for treatment of severe acute hypoxic respiratory failure caused by COVID-19 pneumonia, A. fib RVR.  Once medically stable she will have to be reassessed where she is appropriate for discharge may require SNF.  Consults  : Cards  Procedures  :    TTE -  1. Normal LV function; mild LAE.  2. Left ventricular ejection fraction, by estimation, is 55 to 60%. The left ventricle has normal function. The left ventricle has no regional wall motion abnormalities. Left  ventricular diastolic function could not be evaluated.  3. Right ventricular systolic function is normal. The right ventricular size is normal. Tricuspid regurgitation signal is inadequate for assessing PA pressure.  4. Left atrial size was mildly dilated.  5. The mitral valve is normal in structure. Trivial mitral valve regurgitation. No evidence of mitral stenosis.  6. The aortic valve is tricuspid. Aortic valve regurgitation is not visualized. No aortic stenosis is present.  7. The inferior vena cava is normal in size with greater than 50% respiratory variability, suggesting right atrial pressure of 3 mmHg.  DVT Prophylaxis  :  Eliquis  Lab Results  Component Value Date   PLT 378 10/31/2019    Diet :  Diet Order            Diet NPO time specified Except for: Sips with Meds  Diet effective now               Inpatient Medications Scheduled Meds: . Chlorhexidine Gluconate Cloth  6 each Topical Daily  . diltiazem  90 mg Oral Q8H  . furosemide  40 mg Intravenous Once  . insulin aspart  0-20 Units Subcutaneous Q4H  .  insulin detemir  15 Units Subcutaneous BID  . methylPREDNISolone (SOLU-MEDROL) injection  60 mg Intravenous Daily  . metolazone  5 mg Oral Once  . pantoprazole (PROTONIX) IV  40 mg Intravenous Q24H  . QUEtiapine  25 mg Oral QHS   Continuous Infusions: . amiodarone 30 mg/hr (11/01/19 0800)  . heparin 900 Units/hr (11/01/19 0800)  . remdesivir 100 mg in NS 100 mL Stopped (10/31/19 0913)   PRN Meds:.acetaminophen, albuterol, haloperidol lactate, metoprolol tartrate, ondansetron (ZOFRAN) IV  Antibiotics  :   Anti-infectives (From admission, onward)   Start     Dose/Rate Route Frequency Ordered Stop   10/30/19 1000  remdesivir 100 mg in sodium chloride 0.9 % 100 mL IVPB     100 mg 200 mL/hr over 30 Minutes Intravenous Daily 10/29/19 0914 11/03/19 0959   10/29/19 1000  remdesivir 200 mg in sodium chloride 0.9% 250 mL IVPB     200 mg 580 mL/hr over 30  Minutes Intravenous Once 10/29/19 0914 10/29/19 1236         Objective:   Vitals:   11/01/19 0700 11/01/19 0744 11/01/19 0753 11/01/19 0800  BP:    (!) 145/90  Pulse: (!) 170   72  Resp: (!) 30   (!) 35  Temp:  97.9 F (36.6 C)    TempSrc:  Oral    SpO2: 91%  94% 93%  Weight:      Height:        SpO2: 93 % O2 Flow Rate (L/min): 25 L/min FiO2 (%): 80 %  Wt Readings from Last 3 Encounters:  11/01/19 118.2 kg  09/19/19 132 kg  02/18/19 127.9 kg     Intake/Output Summary (Last 24 hours) at 11/01/2019 0901 Last data filed at 11/01/2019 0800 Gross per 24 hour  Intake 1317.65 ml  Output 2110 ml  Net -792.35 ml     Physical Exam  Awake , mildly confused, No new F.N deficits,  t [place].AT,PERRAL Supple Neck,No JVD, No cervical lymphadenopathy appriciated.  Symmetrical Chest wall movement, Good air movement bilaterally, CTAB RRR,No Gallops, Rubs or new Murmurs, No Parasternal Heave +ve B.Sounds, Abd Soft, No tenderness, No organomegaly appriciated, No rebound - guarding or rigidity. No Cyanosis,  trace edema    Data Review:    Recent Labs  Lab 10/28/19 1511 10/28/19 1511 10/28/19 1535 10/29/19 0450 10/30/19 0631 10/31/19 0503 11/01/19 0044  WBC 4.0  --   --  3.9* 3.8* 8.1  --   HGB 15.7*   < > 16.3* 14.8 14.7 15.4* 16.0*  HCT 49.5*   < > 48.0* 46.1* 44.7 48.0* 47.0*  PLT 206  --   --  206 283 378  --   MCV 94.1  --   --  94.1 92.4 93.8  --   MCH 29.8  --   --  30.2 30.4 30.1  --   MCHC 31.7  --   --  32.1 32.9 32.1  --   RDW 12.9  --   --  12.9 12.9 12.9  --    < > = values in this interval not displayed.    Recent Labs  Lab 10/28/19 1511 10/28/19 1511 10/28/19 1515 10/28/19 1535 10/28/19 1535 10/28/19 1917 10/29/19 0450 10/29/19 0956 10/30/19 0631 10/31/19 0503 11/01/19 0044 11/01/19 0500  NA 141   < >  --  140   < >  --  142  --  140 141 143 144  K 3.9   < >  --  3.8   < >  --  3.6  --  4.1 4.2 4.0 4.1  CL 99   < >  --  101  --   --  103  --   104 102  --  105  CO2 25  --   --   --   --   --  27  --  25 24  --  27  GLUCOSE 235*   < >  --  227*  --   --  170*  --  196* 239*  --  229*  BUN 24*   < >  --  28*  --   --  22  --  24* 21  --  27*  CREATININE 1.65*   < >  --  1.40*  --   --  1.16*  --  1.10* 1.09*  --  1.19*  CALCIUM 8.6*  --   --   --   --   --  8.0*  --  8.3* 8.5*  --  8.5*  AST  --   --   --   --   --   --   --  52* 45* 35  --  46*  ALT  --   --   --   --   --   --   --  33 29 30  --  34  ALKPHOS  --   --   --   --   --   --   --  41 40 50  --  60  BILITOT  --   --   --   --   --   --   --  1.0 0.6 0.8  --  1.2  ALBUMIN  --   --   --   --   --   --   --  2.6* 2.5* 2.5*  --  2.6*  MG  --   --   --   --   --   --   --   --  2.1 2.2  --  2.2  CRP  --   --   --   --   --   --   --  12.0* 12.1* 8.3*  --   --   DDIMER  --   --   --   --   --   --   --  0.84* 0.74* 1.18*  --  13.89*  PROCALCITON  --   --   --   --   --   --   --  <0.10 <0.10 <0.10  --  0.15  TSH  --   --   --   --   --  0.725  --   --   --   --   --   --   HGBA1C  --   --   --   --   --  6.3*  --   --   --   --   --   --   BNP  --   --  761.9*  --   --   --   --   --  237.8* 437.4*  --  269.1*   < > = values in this interval not displayed.    Recent Labs  Lab 10/28/19 1515 10/28/19 1618 10/29/19 0956 10/30/19 0631 10/31/19 0503 11/01/19 0500  CRP  --   --  12.0* 12.1* 8.3*  --  DDIMER  --   --  0.84* 0.74* 1.18* 13.89*  BNP 761.9*  --   --  237.8* 437.4* 269.1*  PROCALCITON  --   --  <0.10 <0.10 <0.10 0.15  SARSCOV2NAA  --  POSITIVE*  --   --   --   --     ------------------------------------------------------------------------------------------------------------------ No results for input(s): CHOL, HDL, LDLCALC, TRIG, CHOLHDL, LDLDIRECT in the last 72 hours.  Lab Results  Component Value Date   HGBA1C 6.3 (H) 10/28/2019   ------------------------------------------------------------------------------------------------------------------ No  results for input(s): TSH, T4TOTAL, T3FREE, THYROIDAB in the last 72 hours.  Invalid input(s): FREET3 ------------------------------------------------------------------------------------------------------------------ Recent Labs    10/29/19 0956 10/30/19 0631  FERRITIN 1,387* 1,344*    Coagulation profile No results for input(s): INR, PROTIME in the last 168 hours.  Recent Labs    10/31/19 0503 11/01/19 0500  DDIMER 1.18* 13.89*    Cardiac Enzymes No results for input(s): CKMB, TROPONINI, MYOGLOBIN in the last 168 hours.  Invalid input(s): CK ------------------------------------------------------------------------------------------------------------------    Component Value Date/Time   BNP 269.1 (H) 11/01/2019 0500    Micro Results Recent Results (from the past 240 hour(s))  SARS CORONAVIRUS 2 (TAT 6-24 HRS) Nasopharyngeal Nasopharyngeal Swab     Status: Abnormal   Collection Time: 10/28/19  4:18 PM   Specimen: Nasopharyngeal Swab  Result Value Ref Range Status   SARS Coronavirus 2 POSITIVE (A) NEGATIVE Final    Comment: RESULT CALLED TO, READ BACK BY AND VERIFIED WITH: M. RUGGIERO,RN 0020 10/29/2019 T. TYSOR (NOTE) SARS-CoV-2 target nucleic acids are DETECTED. The SARS-CoV-2 RNA is generally detectable in upper and lower respiratory specimens during the acute phase of infection. Positive results are indicative of the presence of SARS-CoV-2 RNA. Clinical correlation with patient history and other diagnostic information is  necessary to determine patient infection status. Positive results do not rule out bacterial infection or co-infection with other viruses.  The expected result is Negative. Fact Sheet for Patients: SugarRoll.be Fact Sheet for Healthcare Providers: https://www.woods-mathews.com/ This test is not yet approved or cleared by the [place] FDA and  has been authorized for detection and/or diagnosis of  SARS-CoV-2 by FDA under an Emergency Use Authorization (EUA). This EUA will remain  in effect (meaning this test can be used) for  the duration of the COVID-19 declaration under Section 564(b)(1) of the Act, 21 U.S.C. section 360bbb-3(b)(1), unless the authorization is terminated or revoked sooner. Performed at [facility], [place] [address], [place], [place] [address]     Radiology Reports DG Chest [place] 1 View  Result Date: 11/01/2019 CLINICAL DATA:  Acute respiratory failure EXAM: PORTABLE CHEST 1 VIEW COMPARISON:  Yesterday FINDINGS: Pneumomediastinum is newly seen along both mediastinal contours. No visible pneumothorax. Extensive bilateral pneumonia. Cardiomegaly and aortic tortuosity. These results will be called to the ordering clinician or representative by the Radiologist Assistant, and communication documented in the PACS or [employer]. IMPRESSION: 1. New pneumomediastinum. 2. Stable bilateral pneumonia.  No visible pneumothorax. Electronically Signed   By: Monte Fantasia M.D.   On: 11/01/2019 07:21   DG CHEST PORT 1 VIEW  Result Date: 10/31/2019 CLINICAL DATA:  Tachypnea EXAM: PORTABLE CHEST 1 VIEW COMPARISON:  10/31/2019, 10/30/2019, 10/28/2019 FINDINGS: Rotated patient. Fairly extensive bilateral ground-glass opacities and consolidations probably without significant change allowing for rotated patient. Enlarged cardiomediastinal silhouette also exaggerated by rotation. No pleural effusion or pneumothorax. IMPRESSION: Likely no significant interval change in fairly extensive bilateral airspace disease and consolidations allowing for patient rotation Electronically Signed  By: Donavan Foil M.D.   On: 10/31/2019 19:56   DG Chest Port 1 View  Result Date: 10/31/2019 CLINICAL DATA:  Shortness of breath. COVID-19 viral pneumonia. EXAM: PORTABLE CHEST 1 VIEW COMPARISON:  02/21/2020 FINDINGS: Heart size remains within normal limits. Bilateral diffuse heterogeneous airspace  opacity shows no significant change. No evidence of pneumothorax or pleural effusion. IMPRESSION: No significant change in diffuse heterogeneous airspace disease. Electronically Signed   By: Marlaine Hind M.D.   On: 10/31/2019 08:16   DG Chest Port 1 View  Result Date: 10/30/2019 CLINICAL DATA:  COVID positive. EXAM: PORTABLE CHEST 1 VIEW COMPARISON:  Chest x-ray dated October 28, 2019. FINDINGS: Stable cardiomediastinal silhouette. Normal pulmonary vascularity. Low lung volumes. Patchy interstitial and airspace opacities throughout both lungs, mildly worsened at the left lung base. No pleural effusion or pneumothorax. No acute osseous abnormality. IMPRESSION: 1. Multifocal pneumonia, mildly worsened at the left lung base. Electronically Signed   By: Titus Dubin M.D.   On: 10/30/2019 08:26   DG Chest Portable 1 View  Result Date: 10/28/2019 CLINICAL DATA:  Dyspnea with exertion. EXAM: PORTABLE CHEST 1 VIEW COMPARISON:  February 28, 2017. FINDINGS: Stable cardiomediastinal silhouette. Central pulmonary vascular congestion is noted. Bilateral lung opacities are noted which may represent edema or possibly multifocal pneumonia. Atherosclerosis of thoracic aorta is noted. No pneumothorax or pleural effusion is noted. Bony thorax is unremarkable. IMPRESSION: Aortic atherosclerosis. Central pulmonary vascular congestion is noted. Bilateral lung opacities are noted which may represent edema or possibly multifocal pneumonia. Aortic Atherosclerosis (ICD10-I70.0). Electronically Signed   By: Marijo Conception M.D.   On: 10/28/2019 15:42   ECHOCARDIOGRAM COMPLETE  Result Date: 10/29/2019    ECHOCARDIOGRAM REPORT   Patient Name:   LOUGENIA CURLEE Date of Exam: 10/29/2019 Medical Rec #:  [id number]      Height:       61.0 in Accession #:    [id number]     Weight:       270.0 lb Date of Birth:  1945-11-18      BSA:          2.146 m Patient Age:    53 years       BP:           136/73 mmHg Patient Gender: F              HR:            135 bpm. Exam Location:  Inpatient Procedure: 2D Echo, Cardiac Doppler and Color Doppler Indications:    I48.0 Paroxysmal atrial fibrillation  History:        Patient has no prior history of Echocardiogram examinations.                 COVID-19 Positive.  Sonographer:    Jonelle Sidle Dance Referring Phys: [place]  1. Normal LV function; mild LAE.  2. Left ventricular ejection fraction, by estimation, is 55 to 60%. The left ventricle has normal function. The left ventricle has no regional wall motion abnormalities. Left ventricular diastolic function could not be evaluated.  3. Right ventricular systolic function is normal. The right ventricular size is normal. Tricuspid regurgitation signal is inadequate for assessing PA pressure.  4. Left atrial size was mildly dilated.  5. The mitral valve is normal in structure. Trivial mitral valve regurgitation. No evidence of mitral stenosis.  6. The aortic valve is tricuspid. Aortic valve regurgitation is not visualized. No aortic stenosis is present.  7. The inferior vena cava is normal in size with greater than 50% respiratory variability, suggesting right atrial pressure of 3 mmHg. FINDINGS  Left Ventricle: Left ventricular ejection fraction, by estimation, is 55 to 60%. The left ventricle has normal function. The left ventricle has no regional wall motion abnormalities. The left ventricular internal cavity size was normal in size. There is  no left ventricular hypertrophy. Left ventricular diastolic function could not be evaluated due to atrial fibrillation. Left ventricular diastolic function could not be evaluated. Right Ventricle: The right ventricular size is normal. Right ventricular systolic function is normal. Tricuspid regurgitation signal is inadequate for assessing PA pressure. Left Atrium: Left atrial size was mildly dilated. Right Atrium: Right atrial size was normal in size. Pericardium: There is no evidence of pericardial  effusion. Mitral Valve: The mitral valve is normal in structure. Normal mobility of the mitral valve leaflets. Trivial mitral valve regurgitation. No evidence of mitral valve stenosis. Tricuspid Valve: The tricuspid valve is normal in structure. Tricuspid valve regurgitation is mild . No evidence of tricuspid stenosis. Aortic Valve: The aortic valve is tricuspid. Aortic valve regurgitation is not visualized. No aortic stenosis is present. Pulmonic Valve: The pulmonic valve was not well visualized. Pulmonic valve regurgitation is trivial. No evidence of pulmonic stenosis. Aorta: The aortic root is normal in size and structure. Venous: The inferior vena cava is normal in size with greater than 50% respiratory variability, suggesting right atrial pressure of 3 mmHg.  Additional Comments: Normal LV function; mild LAE.  LEFT VENTRICLE PLAX 2D LVIDd:         4.00 cm LVIDs:         2.85 cm LV PW:         1.10 cm LV IVS:        1.13 cm LVOT diam:     1.85 cm LV SV:         40 LV SV Index:   19 LVOT Area:     2.69 cm  RIGHT VENTRICLE             IVC RV Basal diam:  2.82 cm     IVC diam: 1.67 cm RV S prime:     12.20 cm/s TAPSE (M-mode): 0.9 cm LEFT ATRIUM           Index       RIGHT ATRIUM           Index LA diam:      3.20 cm 1.49 cm/m  RA Area:     15.80 cm LA Vol (A2C): 74.0 ml 34.49 ml/m RA Volume:   39.80 ml  18.55 ml/m LA Vol (A4C): 61.6 ml 28.71 ml/m  AORTIC VALVE LVOT Vmax:   122.50 cm/s LVOT Vmean:  66.600 cm/s LVOT VTI:    0.150 m  AORTA Ao Root diam: 3.30 cm Ao Asc diam:  3.20 cm MITRAL VALVE MV Area (PHT): 3.62 cm    SHUNTS MV Decel Time: 210 msec    Systemic VTI:  0.15 m MV E velocity: 53.35 cm/s  Systemic Diam: 1.85 cm Kirk Ruths MD Electronically signed by Kirk Ruths MD Signature Date/Time: 10/29/2019/2:08:19 PM    Final     Time Spent in minutes  30   Lala Lund M.D on 11/01/2019 at 9:01 AM  To page go to [email or web address] - password [facility]

## 2019-11-01 NOTE — Procedures (Signed)
Arterial Catheter Insertion Procedure Note Julia Mcfarland [id number] 1945-11-24  Procedure: Insertion of Arterial Catheter  Indications: Blood pressure monitoring  Procedure Details Consent: Risks of procedure as well as the alternatives and risks of each were explained to the (patient/caregiver).  Consent for procedure obtained. Time Out: Verified patient identification, verified procedure, site/side was marked, verified correct patient position, special equipment/implants available, medications/allergies/relevent history reviewed, required imaging and test results available.  Performed  Placed by Charge RT-Tim   Maximum sterile technique was used including antiseptics, cap, gloves, gown, hand hygiene, mask and sheet. Skin prep: Chlorhexidine; local anesthetic administered 20 gauge catheter was inserted into left radial artery using the Seldinger technique. ULTRASOUND GUIDANCE USED: NO Evaluation Blood flow good; BP tracing good. Complications: No apparent complications.   Julia Mcfarland 11/01/2019

## 2019-11-01 NOTE — Progress Notes (Addendum)
NAME:  Julia Mcfarland, MRN:  [id number], DOB:  1945/09/14, LOS: 4 ADMISSION DATE:  10/28/2019, CONSULTATION DATE:  10/31/2019 REFERRING MD:  TRH, CHIEF COMPLAINT:  SOB/ hypoxia  Brief History   56 yoF admitted with acute hypoxic respiratory failure secondary to COVID 19 infection with new onset Afib with RVR, AKI, and elevated BNP.  Since hospitalization, her rate control has been difficult to manage due to mild hypotension and tenuous respiratory status now with increasing O2 requirement/ support on HHFNC and worsening dyspnea.  PCCM consulted for further evaluation.   History of present illness    74 year old female with prior history of chronic venous insufficiency, arthritis, and morbid obesity who initially presented on 4/26 with complaints of generalized malaise, palpitations, shortness of breath, chills, and non productive cough for three days.  Patient has had COVID 19 vaccines and patient's daughter is currently hospitalized with COVID.  Found to be in Afib with RVR, with AKI, hypoxic and COVID positive.  Was admitted with Cardiology following.  Was on heparin gtt and was transitioned from cardizem to amiodarone with digoxin due to hypotension and better rate control.  Troponin trend was flat with elevated BNP.  Given lasix on 4/26 and oral dose 4/29.  Echo showed normal LVEF.  Started on remdesivir and decadron.  Her PCT trends have been normal.  CXR showed bilateral infiltrates.  Her oxygen requirement has been increasing with worsening work of breathing, now requiring heated high flow nasal cannula with flow rate at 35L/ 100% FiO2.  ABG 7.511/ 32.5/ 75.5/ 25.8. PCCM consulted for further evaluation and concern of hypoxic respiratory failure.    At bedside, patient was somnolent, had received ativan at [place], but arousable.  RR was improved, around 25-28, with sats 90-93% on HHF 80%/40L.  She was able to tell me her name, but could not tell me why she was here.  Moving all extremities.  HR  stable in 105-115 range.    Past Medical History  Chronic venous insufficiency, arthritis, morbid obesity   Significant Hospital Events   4/26 Admitted   Consults:  [facility]   Procedures:   Significant Diagnostic Tests:  4/27 TTE >> 1. Normal LV function; mild LAE.  2. Left ventricular ejection fraction, by estimation, is 55 to 60%. The  left ventricle has normal function. The left ventricle has no regional  wall motion abnormalities. Left ventricular diastolic function could not  be evaluated.  3. Right ventricular systolic function is normal. The right ventricular  size is normal. Tricuspid regurgitation signal is inadequate for assessing  PA pressure.  4. Left atrial size was mildly dilated.  5. The mitral valve is normal in structure. Trivial mitral valve  regurgitation. No evidence of mitral stenosis.  6. The aortic valve is tricuspid. Aortic valve regurgitation is not  visualized. No aortic stenosis is present.  7. The inferior vena cava is normal in size with greater than 50%  respiratory variability, suggesting right atrial pressure of 3 mmHg.   Micro Data:  4/26 SARS2 >> positive   Antimicrobials:  N/a   4/27 remdesivir >>  Interim history/subjective:  Agitated intermittently. She denies pain. She is pleasantly confused.  Objective   Blood pressure (!) 145/90, pulse 72, temperature 97.9 F (36.6 C), temperature source Oral, resp. rate (!) 35, height 5\' 1"  (1.549 m), weight 118.2 kg, SpO2 93 %.    FiO2 (%):  [80 %-100 %] 80 %   Intake/Output Summary (Last 24 hours) at 11/01/2019  [id number] Last data filed at 11/01/2019 0800 Gross per 24 hour  Intake 1317.65 ml  Output 2110 ml  Net -792.35 ml   Filed Weights   10/31/19 2315 10/31/19 2348 11/01/19 0253  Weight: 123.7 kg 118.4 kg 118.2 kg    Examination: GEN: no acute distress HEENT: MMM, trachea midline CV: Irregular, ext warm PULM: Some crackles at bases, RR varies between 18 and 30  depending on agitation level GI: Soft, hypoactive BS EXT: 1+ edema NEURO: Moves all 4 ext to command PSYCH: AO x 1 SKIN: no rashes  Na up BUN a little up D dimer up Sugars up  Resolved Hospital Problem list    Assessment & Plan:  # Acute hypoxic respiratory failure # [id number] Complicated by delirium more than intrinsic lung disease.  Hopefully if we can control former we can avoid intubation.  She will not do well on mechanical ventilation. - continue remdesivir and dexamethasone - strict I/Os, add a dose of metolazone, trend chemistries  # Acute metabolic encephalopathy- ABG without retention.  This all appears to be delirium. - seroquel trial - PRN haldol - try to avoid benzodiazepines - can consider precedex if needed  # Afib with RVR: supposedly new onset this admission; this is worsened with her delirium spells - PO metoprolol and amio gtt - We can save CCB, dig if refractory to this - Her HR is controlled when she is calm - Eliquis  # Pneumomediastinum - Limit agitation, bronchospasm, and coughing as able - Nothing acute to do here  # Diabetes: Uncontrolled - per primary  # Asthma:   - currently on dexamethasone - scheduled albuterol nebs + prn  Best practice:  Diet: NPO Pain/Anxiety/Delirium protocol (if indicated): n/a VAP protocol (if indicated): n/a DVT prophylaxis: heparin gtt GI prophylaxis: n/a Glucose control: increased Mobility: n/a Code Status: Full Family Communication: Spoke with husband Julia Mcfarland over the phone at 2330.  I discussed with husband that if Julia Mcfarland ends up on ventilator best case scenario would be LTACH but more likely in hospital death.  He is hopeful she can pull through and is comfortable with all measures including trach/PEG/LTACH if needed. Disposition: transfer to ICU 4/29    The patient is critically ill with multiple organ systems failure and requires high complexity decision making for assessment and support, frequent  evaluation and titration of therapies, application of advanced monitoring technologies and extensive interpretation of multiple databases. Critical Care Time devoted to patient care services described in this note independent of APP/resident time (if applicable)  is 32 minutes.   Erskine Emery MD [facility] 11/01/2019 9:14 AM Personal pager: [phone] If unanswered, please page CCM On-call: [phone]

## 2019-11-01 NOTE — Plan of Care (Signed)
Came to the bedside.  Patient had increased HR in Afib with RVR to the 150s-160s.  BP on A line stable.   Very delirious and confused but protecting her airway and awake.  Respirations 30s on heated high flow [place].    Plan: - IV metoprolol given.  HR improved to 120s-130s. - IV haldol 2mg  given for agitation - continue diuresis (lasix already given- with good urine output) - continue heated high flow [place]- patient does not need to be intubated at this time.  Continue close monitoring in the ICU.

## 2019-11-02 ENCOUNTER — Inpatient Hospital Stay (HOSPITAL_COMMUNITY): Payer: Medicare HMO

## 2019-11-02 DIAGNOSIS — I4891 Unspecified atrial fibrillation: Secondary | ICD-10-CM | POA: Diagnosis not present

## 2019-11-02 DIAGNOSIS — J9601 Acute respiratory failure with hypoxia: Secondary | ICD-10-CM | POA: Diagnosis not present

## 2019-11-02 LAB — COMPREHENSIVE METABOLIC PANEL
ALT: 32 U/L (ref 0–44)
AST: 30 U/L (ref 15–41)
Albumin: 2.6 g/dL — ABNORMAL LOW (ref 3.5–5.0)
Alkaline Phosphatase: 63 U/L (ref 38–126)
Anion gap: 14 (ref 5–15)
BUN: 27 mg/dL — ABNORMAL HIGH (ref 8–23)
CO2: 29 mmol/L (ref 22–32)
Calcium: 8.8 mg/dL — ABNORMAL LOW (ref 8.9–10.3)
Chloride: 102 mmol/L (ref 98–111)
Creatinine, Ser: 1.18 mg/dL — ABNORMAL HIGH (ref 0.44–1.00)
GFR calc Af Amer: 53 mL/min — ABNORMAL LOW (ref >60)
GFR calc non Af Amer: 46 mL/min — ABNORMAL LOW (ref >60)
Glucose, Bld: 95 mg/dL (ref 70–99)
Potassium: 3.4 mmol/L — ABNORMAL LOW (ref 3.5–5.1)
Sodium: 145 mmol/L (ref 135–145)
Total Bilirubin: 1 mg/dL (ref 0.3–1.2)
Total Protein: 6.3 g/dL — ABNORMAL LOW (ref 6.5–8.1)

## 2019-11-02 LAB — GLUCOSE, CAPILLARY
Glucose-Capillary: 97 mg/dL (ref 70–99)
Glucose-Capillary: 104 mg/dL — ABNORMAL HIGH (ref 70–99)
Glucose-Capillary: 149 mg/dL — ABNORMAL HIGH (ref 70–99)
Glucose-Capillary: 314 mg/dL — ABNORMAL HIGH (ref 70–99)
Glucose-Capillary: 211 mg/dL — ABNORMAL HIGH (ref 70–99)

## 2019-11-02 LAB — D-DIMER, QUANTITATIVE: D-Dimer, Quant: 16.96 ug/mL-FEU — ABNORMAL HIGH (ref 0.00–0.50)

## 2019-11-02 LAB — MAGNESIUM: Magnesium: 2.2 mg/dL (ref 1.7–2.4)

## 2019-11-02 LAB — BRAIN NATRIURETIC PEPTIDE: B Natriuretic Peptide: 181.6 pg/mL — ABNORMAL HIGH (ref 0.0–100.0)

## 2019-11-02 LAB — C-REACTIVE PROTEIN: CRP: 3.3 mg/dL — ABNORMAL HIGH (ref <1.0)

## 2019-11-02 LAB — PROCALCITONIN: Procalcitonin: <0.1 ng/mL

## 2019-11-02 MED ORDER — METHYLPREDNISOLONE SODIUM SUCC 40 MG IJ SOLR
20.0000 mg | Freq: Every day | INTRAMUSCULAR | Status: AC
  Administered 2019-11-02: 09:00:00 20 mg via INTRAVENOUS
  Filled 2019-11-02: qty 1

## 2019-11-02 MED ORDER — POTASSIUM CHLORIDE CRYS ER 20 MEQ PO TBCR
40.0000 meq | EXTENDED_RELEASE_TABLET | Freq: Once | ORAL | Status: AC
  Administered 2019-11-02: 40 meq via ORAL
  Filled 2019-11-02: qty 2

## 2019-11-02 MED ORDER — POTASSIUM CHLORIDE CRYS ER 20 MEQ PO TBCR
40.0000 meq | EXTENDED_RELEASE_TABLET | Freq: Once | ORAL | Status: AC
  Administered 2019-11-02: 16:00:00 40 meq via ORAL
  Filled 2019-11-02: qty 2

## 2019-11-02 MED ORDER — PANTOPRAZOLE SODIUM 40 MG PO TBEC
40.0000 mg | DELAYED_RELEASE_TABLET | Freq: Every day | ORAL | Status: DC
  Administered 2019-11-02 – 2019-11-14 (×13): 40 mg via ORAL
  Filled 2019-11-02 (×14): qty 1

## 2019-11-02 MED ORDER — ENOXAPARIN SODIUM 120 MG/0.8ML ~~LOC~~ SOLN
115.0000 mg | Freq: Two times a day (BID) | SUBCUTANEOUS | Status: DC
  Administered 2019-11-02 – 2019-11-04 (×6): 115 mg via SUBCUTANEOUS
  Filled 2019-11-02 (×7): qty 0.77

## 2019-11-02 MED ORDER — INSULIN DETEMIR 100 UNIT/ML ~~LOC~~ SOLN
15.0000 [IU] | Freq: Every day | SUBCUTANEOUS | Status: DC
  Administered 2019-11-03 – 2019-11-12 (×10): 15 [IU] via SUBCUTANEOUS
  Filled 2019-11-02 (×11): qty 0.15

## 2019-11-02 MED ORDER — METOLAZONE 5 MG PO TABS
5.0000 mg | ORAL_TABLET | Freq: Once | ORAL | Status: AC
  Administered 2019-11-02: 5 mg via ORAL
  Filled 2019-11-02: qty 1

## 2019-11-02 NOTE — Progress Notes (Signed)
ANTICOAGULATION CONSULT NOTE - Follow Up Consult  Pharmacy Consult for Eliquis > Lovenox Indication: atrial fibrillation  No Known Allergies  Patient Measurements: Height: 5\' 1"  (154.9 cm) Weight: 117.3 kg (258 lb 9.6 oz) IBW/kg (Calculated) : 47.8  Vital Signs: Temp: 97.4 F (36.3 C) (05/01 0315) Temp Source: Axillary (05/01 0315) BP: 112/89 (05/01 0700) Pulse Rate: 26 (05/01 0700)  Labs: Recent Labs    10/31/19 0503 11/01/19 0044 11/01/19 0500 11/02/19 0500  HGB 15.4* 16.0*  --   --   HCT 48.0* 47.0*  --   --   PLT 378  --   --   --   CREATININE 1.09*  --  1.19* 1.18*    Estimated Creatinine Clearance: 51.4 mL/min (A) (by C-G formula based on SCr of 1.18 mg/dL (H)).  Assessment:   74 yr old female transitioned from IV Heparin to Eliquis on 4/29. D-dimer continues to climb while on Eliquis. MD consulted Pharmacy this AM to change to Full Dose Lovenox therapy.   Last Eliquis dose given 4/30 at 21:06PM.  CBC has been stable (last 4/30).  SCr is stable at 1.1 with CrCl ~54 mL/min.  No bleeding observed.   Goal of Therapy:  Anti-Xa level 0.6-1 units/ml 4hrs after LMWH dose given Monitor platelets by anticoagulation protocol: Yes   Plan:  Lovenox 115 mg/kg SQ every 12 hours.  Will check levels in this patient due to BMI >30- will plan for tomorrow AM 4 hours after the 3rd dose.  Monitor CBC and for any signs of bleeding.   Sloan Leiter, PharmD, BCPS, BCCCP Clinical Pharmacist Please refer to [facility] for [place] numbers 11/02/2019,7:39 AM

## 2019-11-02 NOTE — Progress Notes (Signed)
NAME:  RYA BISAILLON, MRN:  [id number], DOB:  1945-11-22, LOS: 5 ADMISSION DATE:  10/28/2019, CONSULTATION DATE:  10/31/2019 REFERRING MD:  TRH, CHIEF COMPLAINT:  SOB/ hypoxia  Brief History   10 yoF admitted with acute hypoxic respiratory failure secondary to COVID 19 infection with new onset Afib with RVR, AKI, and elevated BNP.  Since hospitalization, her rate control has been difficult to manage due to mild hypotension and tenuous respiratory status now with increasing O2 requirement/ support on HHFNC and worsening dyspnea.  PCCM consulted for further evaluation.   History of present illness    74 year old female with prior history of chronic venous insufficiency, arthritis, and morbid obesity who initially presented on 4/26 with complaints of generalized malaise, palpitations, shortness of breath, chills, and non productive cough for three days.  Patient has had COVID 19 vaccines and patient's daughter is currently hospitalized with COVID.  Found to be in Afib with RVR, with AKI, hypoxic and COVID positive.  Was admitted with Cardiology following.  Was on heparin gtt and was transitioned from cardizem to amiodarone with digoxin due to hypotension and better rate control.  Troponin trend was flat with elevated BNP.  Given lasix on 4/26 and oral dose 4/29.  Echo showed normal LVEF.  Started on remdesivir and decadron.  Her PCT trends have been normal.  CXR showed bilateral infiltrates.  Her oxygen requirement has been increasing with worsening work of breathing, now requiring heated high flow nasal cannula with flow rate at 35L/ 100% FiO2.  ABG 7.511/ 32.5/ 75.5/ 25.8. PCCM consulted for further evaluation and concern of hypoxic respiratory failure.    At bedside, patient was somnolent, had received ativan at [place], but arousable.  RR was improved, around 25-28, with sats 90-93% on HHF 80%/40L.  She was able to tell me her name, but could not tell me why she was here.  Moving all extremities.  HR  stable in 105-115 range.    Past Medical History  Chronic venous insufficiency, arthritis, morbid obesity   Significant Hospital Events   4/26 Admitted   Consults:  [facility]   Procedures:   Significant Diagnostic Tests:  4/27 TTE >> 1. Normal LV function; mild LAE.  2. Left ventricular ejection fraction, by estimation, is 55 to 60%. The  left ventricle has normal function. The left ventricle has no regional  wall motion abnormalities. Left ventricular diastolic function could not  be evaluated.  3. Right ventricular systolic function is normal. The right ventricular  size is normal. Tricuspid regurgitation signal is inadequate for assessing  PA pressure.  4. Left atrial size was mildly dilated.  5. The mitral valve is normal in structure. Trivial mitral valve  regurgitation. No evidence of mitral stenosis.  6. The aortic valve is tricuspid. Aortic valve regurgitation is not  visualized. No aortic stenosis is present.  7. The inferior vena cava is normal in size with greater than 50%  respiratory variability, suggesting right atrial pressure of 3 mmHg.   Micro Data:  4/26 SARS2 >> positive   Antimicrobials:  N/a   4/27 remdesivir >>  Interim history/subjective:  Pleasantly confused this AM. Diuresed well  Objective   Blood pressure 112/89, pulse (!) 26, temperature 97.9 F (36.6 C), temperature source Axillary, resp. rate (!) 26, height 5\' 1"  (1.549 m), weight 117.3 kg, SpO2 96 %.    FiO2 (%):  [80 %-90 %] 90 %   Intake/Output Summary (Last 24 hours) at 11/02/2019 [id number] Last data  filed at 11/02/2019 0600 Gross per 24 hour  Intake 273.19 ml  Output 2500 ml  Net -2226.81 ml   Filed Weights   10/31/19 2348 11/01/19 0253 11/02/19 0500  Weight: 118.4 kg 118.2 kg 117.3 kg    Examination: GEN: no acute distress HEENT: MMM, trachea midline CV: Irregular, ext warm PULM: Some crackles at bases, RR improved from yesterday GI: Soft, hypoactive BS EXT: 1+  edema NEURO: Moves all 4 ext to command PSYCH: AO x 1 SKIN: no rashes  BUN/Cr steady CRP improved  Resolved Hospital Problem list    Assessment & Plan:  # Acute hypoxic respiratory failure # COVID-19 - continue remdesivir and dexamethasone - strict I/Os, another dose of metolazone - up to chair if able - decision to intubate based on clinical trajectory and clinical gestalt rather than oxygen saturations alone in COVID patients.  In this patient's case would use a very high threshold; if she ends up on vent runs high risk of trach/PEG/LTACH  # Acute metabolic encephalopathy- ABG without retention.  This all appears to be delirium. - continue seroquel, use PRN haldol - try to avoid benzodiazepines - can consider precedex if needed  # Afib with RVR: improved - PO metoprolol - Her HR is better controlled when she is calm - Eliquis  # Pneumomediastinum - Limit agitation, bronchospasm, and coughing as able - Nothing acute to do here  # Diabetes: Uncontrolled - per primary  # Asthma:   - currently on dexamethasone - scheduled albuterol nebs + prn  Best practice:  Diet: clear liquids Pain/Anxiety/Delirium protocol (if indicated): see above VAP protocol (if indicated): n/a DVT prophylaxis: heparin gtt GI prophylaxis: n/a Glucose control: increased Mobility: n/a Code Status: Full Family Communication: 4/30.  I discussed with husband that if Fred ends up on ventilator best case scenario would be LTACH but more likely in hospital death.  He is hopeful she can pull through and is comfortable with all measures including trach/PEG/LTACH if needed. Disposition: I am available PRN if she decompensates further  Erskine Emery MD [facility] 11/02/2019 8:32 AM Personal pager: [phone] If unanswered, please page CCM On-call: [phone]

## 2019-11-02 NOTE — Progress Notes (Signed)
Julia Mcfarland is a 74 y.o. female patient who transferred  from 6M awake, alert  & orientated  X 3, disoriented to time, Full Code, VSS - Blood pressure 101/66, pulse 77, temperature 98.2 F (36.8 C), resp. rate (!) 28, height 5\' 1"  (1.549 m), weight 117.3 kg, SpO2 94 %., O2  @25  L Heated high flow nasal cannula FIO2 at 80, no c/o shortness of breath, no c/o chest pain, no distress noted. Tele placed and pt is currently running: Afib.   IV site WDL: with a transparent dsg that's clean dry and intact.  Allergies:  No Known Allergies   Past Medical History:  Diagnosis Date  . Allergy   . Arthritis   . Chicken pox   . Chronic venous insufficiency    a. Uses furosemide prn.  . Degenerative joint disease   . Morbid obesity ([place])   . Osteoarthritis   . PONV (postoperative nausea and vomiting)     Pt orientation to unit, room and routine. SR up x 2, fall risk assessment complete with Patient gestering  understanding of risks associated with falls. Pt verbalizes an understanding of how to use the call bell and to call for help before getting out of bed.  Skin, clean-dry- intact without evidence of bruising, or skin tears.   Moisture associated skin break down in peri and buttocks areas.    Will cont to monitor and assist as needed.  Parthenia Ames, RN 11/02/2019 10:53 PM

## 2019-11-02 NOTE — Progress Notes (Signed)
PROGRESS NOTE                                                                                                                                                                                                             [name], is a 74 y.o. female, DOB - May 20, 1946, [id number]  Admit date - 10/28/2019   Admitting Physician Sanda Klein, MD  Outpatient Primary MD for the patient is Baity, Coralie Keens, NP  LOS - 5  Chief Complaint  Patient presents with  . Tachycardia       Brief Narrative  - Julia Mcfarland is a 74 y.o. female with medical history significant of chronic venous insufficiency, arthritis, and morbid obesity presented complaints of palpitations, generalized malaise, and shortness of breath over the last 3 days.  Associated symptoms included chills, generalized weakness, and a nonproductive cough.  Upon admission she was found to be in A. fib with RVR with heart rates into the 170s.     She was admitted by cardiology team on 26 April subsequently found to have COVID-19 pneumonia and hospitalist team was consulted.   Subjective:   Patient in bed, denies headache, no chest - Abd pain, no SOB   Assessment  & Plan :     1.  Acute hypoxic respiratory failure due to [id number] pneumonia complicated by pneumomediastinum.  She was initially treated for the first 2 to 3 days of hospitalization with IV steroids and remdesivir, however she continued to decline, she was transferred to my care on 10/30/2018 when she promptly received Actemra however by that time she had already progressed to heated high flow.  She seems to have overall stabilized with stabilization of inflammatory markers.  She was up to 40 L heated high flow currently she is on 25 L heated high flow with FiO2 of 80%.  She has developed a small pneumomediastinum on 10/31/2019 which will be closely monitored, No signs of bacterial infection or esophageal rupture.  No chest or  abdominal pain.  Gradually improving continue to monitor.  Overall extremely tenuous.  Monitor in ICU for now.   Recent Labs  Lab 10/28/19 1515 10/28/19 1535 10/28/19 1917 10/29/19 0450 10/29/19 0450 10/29/19 0956 10/30/19 0631 10/31/19 0503 11/01/19 0044 11/01/19 0500 11/02/19 0500  NA  --    < >  --  142   < >  --  140 141 143 144 145  K  --    < >  --  3.6   < >  --  4.1 4.2 4.0 4.1 3.4*  CL  --    < >  --  103  --   --  104 102  --  105 102  CO2  --    < >  --  27  --   --  25 24  --  27 29  GLUCOSE  --    < >  --  170*  --   --  196* 239*  --  229* 95  BUN  --    < >  --  22  --   --  24* 21  --  27* 27*  CREATININE  --    < >  --  1.16*  --   --  1.10* 1.09*  --  1.19* 1.18*  CALCIUM  --    < >  --  8.0*  --   --  8.3* 8.5*  --  8.5* 8.8*  AST  --   --   --   --   --  52* 45* 35  --  46* 30  ALT  --   --   --   --   --  33 29 30  --  34 32  ALKPHOS  --   --   --   --   --  41 40 50  --  60 63  BILITOT  --   --   --   --   --  1.0 0.6 0.8  --  1.2 1.0  ALBUMIN  --   --   --   --   --  2.6* 2.5* 2.5*  --  2.6* 2.6*  MG  --   --   --   --   --   --  2.1 2.2  --  2.2 2.2  CRP  --   --   --   --   --  12.0* 12.1* 8.3*  --  4.7* 3.3*  DDIMER  --   --   --   --   --  0.84* 0.74* 1.18*  --  13.89* 16.96*  PROCALCITON  --   --   --   --   --  <0.10 <0.10 <0.10  --  0.15  --   TSH  --   --  0.725  --   --   --   --   --   --   --   --   HGBA1C  --   --  6.3*  --   --   --   --   --   --   --   --   BNP 761.9*  --   --   --   --   --  237.8* 437.4*  --  269.1* 181.6*   < > = values in this interval not displayed.    Recent Labs  Lab 10/28/19 1515 10/28/19 1618 10/29/19 0956 10/30/19 0631 10/31/19 0503 11/01/19 0500 11/02/19 0500  CRP  --   --  12.0* 12.1* 8.3* 4.7* 3.3*  DDIMER  --   --  0.84* 0.74* 1.18* 13.89* 16.96*  BNP 761.9*  --   --  237.8* 437.4* 269.1* 181.6*  PROCALCITON  --   --  <0.10 <0.10 <0.10 0.15  --   SARSCOV2NAA  --  POSITIVE*  --   --   --   --    --  2.  Paroxysmal A. fib RVR.  Mali vas 2 score of greater than 3.  Cardiology on board, TSH and echocardiogram, initially required IV amiodarone and digoxin, currently on oral Lopressor p.o. and IV as needed, Lovenox for anticoagulation for now as D-dimer is rapidly rising likely due to inflammation.  3.  Obesity with BMI of 51.  Follow with PCP for weight loss.  4.  AKI.  Currently seems to have resolved will monitor closely.  5.  Metabolic encephalopathy.  Supportive care, nighttime Seroquel.  Mittens if needed.  Monitor.    6. Mild Acute on Chronic Diastolic CHF - rate control, diuresis as blood pressure is able to tolerate.  7.  Hypokalemia.  Replaced.    8.  Rising D-dimer despite being on Eliquis.  This is likely due to inflammation from COVID-19, hold Eliquis and transition to Lovenox, check lower extremity venous duplex and monitor.    9. DM type II.  Lantus plus sliding scale monitor and adjust.  Steroid dose being tapered.  Lab Results  Component Value Date   HGBA1C 6.3 (H) 10/28/2019   CBG (last 3)  Recent Labs    11/01/19 2319 11/02/19 0300 11/02/19 0728  GLUCAP 149* 97 104*      Family Communication  : Husband [name] [phone] on 10/30/2019, 10/31/19, 11/01/19 husband clearly explained that Ms. Julia Mcfarland's situation remains quite critical.  Code Status :  Full  Disposition Plan  : Stay inpatient in the hospital for treatment of severe acute hypoxic respiratory failure caused by COVID-19 pneumonia, A. fib RVR.  Once medically stable she will have to be reassessed where she is appropriate for discharge may require SNF.  Consults  : Cards  Procedures  :    TTE -  1. Normal LV function; mild LAE.  2. Left ventricular ejection fraction, by estimation, is 55 to 60%. The left ventricle has normal function. The left ventricle has no regional wall motion abnormalities. Left ventricular diastolic function could not be evaluated.  3. Right ventricular systolic  function is normal. The right ventricular size is normal. Tricuspid regurgitation signal is inadequate for assessing PA pressure.  4. Left atrial size was mildly dilated.  5. The mitral valve is normal in structure. Trivial mitral valve regurgitation. No evidence of mitral stenosis.  6. The aortic valve is tricuspid. Aortic valve regurgitation is not visualized. No aortic stenosis is present.  7. The inferior vena cava is normal in size with greater than 50% respiratory variability, suggesting right atrial pressure of 3 mmHg.  DVT Prophylaxis  :  Eliquis  Lab Results  Component Value Date   PLT 378 10/31/2019    Diet :  Diet Order            Diet clear liquid Room service appropriate? Yes; Fluid consistency: Thin  Diet effective now               Inpatient Medications Scheduled Meds: . Chlorhexidine Gluconate Cloth  6 each Topical Daily  . enoxaparin (LOVENOX) injection  115 mg Subcutaneous Q12H  . insulin aspart  0-20 Units Subcutaneous Q4H  . [START ON 11/03/2019] insulin detemir  15 Units Subcutaneous Daily  . methylPREDNISolone (SOLU-MEDROL) injection  20 mg Intravenous Daily  . metolazone  5 mg Oral Once  . metoprolol tartrate  50 mg Oral BID  . pantoprazole  40 mg Oral Daily  . potassium chloride  40 mEq Oral Once  . QUEtiapine  25 mg Oral QHS   Continuous Infusions: .  remdesivir 100 mg in NS 100 mL Stopped (11/01/19 1031)   PRN Meds:.acetaminophen, albuterol, haloperidol lactate, metoprolol tartrate, ondansetron (ZOFRAN) IV  Antibiotics  :   Anti-infectives (From admission, onward)   Start     Dose/Rate Route Frequency Ordered Stop   10/30/19 1000  remdesivir 100 mg in sodium chloride 0.9 % 100 mL IVPB     100 mg 200 mL/hr over 30 Minutes Intravenous Daily 10/29/19 0914 11/03/19 0959   10/29/19 1000  remdesivir 200 mg in sodium chloride 0.9% 250 mL IVPB     200 mg 580 mL/hr over 30 Minutes Intravenous Once 10/29/19 0914 10/29/19 1236         Objective:     Vitals:   11/02/19 0700 11/02/19 0730 11/02/19 0800 11/02/19 0838  BP: 112/89  91/60   Pulse: (!) 26  62   Resp: (!) 26  (!) 30   Temp:  97.9 F (36.6 C)    TempSrc:  Axillary    SpO2: 96%  94% 91%  Weight:      Height:        SpO2: 91 % O2 Flow Rate (L/min): 25 L/min FiO2 (%): 80 %  Wt Readings from Last 3 Encounters:  11/02/19 117.3 kg  09/19/19 132 kg  02/18/19 127.9 kg     Intake/Output Summary (Last 24 hours) at 11/02/2019 0913 Last data filed at 11/02/2019 0800 Gross per 24 hour  Intake 247.61 ml  Output 2725 ml  Net -2477.39 ml     Physical Exam  Awake Alert, No new F.N deficits,   [place].AT,PERRAL Supple Neck,No JVD, No cervical lymphadenopathy appriciated.  Symmetrical Chest wall movement, Good air movement bilaterally, CTAB iRRR,No Gallops, Rubs or new Murmurs, No Parasternal Heave +ve B.Sounds, Abd Soft, No tenderness, No organomegaly appriciated, No rebound - guarding or rigidity. No Cyanosis, mild to no edema    Data Review:    Recent Labs  Lab 10/28/19 1511 10/28/19 1511 10/28/19 1535 10/29/19 0450 10/30/19 0631 10/31/19 0503 11/01/19 0044  WBC 4.0  --   --  3.9* 3.8* 8.1  --   HGB 15.7*   < > 16.3* 14.8 14.7 15.4* 16.0*  HCT 49.5*   < > 48.0* 46.1* 44.7 48.0* 47.0*  PLT 206  --   --  206 283 378  --   MCV 94.1  --   --  94.1 92.4 93.8  --   MCH 29.8  --   --  30.2 30.4 30.1  --   MCHC 31.7  --   --  32.1 32.9 32.1  --   RDW 12.9  --   --  12.9 12.9 12.9  --    < > = values in this interval not displayed.    Recent Labs  Lab 10/28/19 1515 10/28/19 1535 10/28/19 1917 10/29/19 0450 10/29/19 0450 10/29/19 0956 10/30/19 0631 10/31/19 0503 11/01/19 0044 11/01/19 0500 11/02/19 0500  NA  --    < >  --  142   < >  --  140 141 143 144 145  K  --    < >  --  3.6   < >  --  4.1 4.2 4.0 4.1 3.4*  CL  --    < >  --  103  --   --  104 102  --  105 102  CO2  --    < >  --  27  --   --  25 24  --  27  29  GLUCOSE  --    < >  --  170*  --    --  196* 239*  --  229* 95  BUN  --    < >  --  22  --   --  24* 21  --  27* 27*  CREATININE  --    < >  --  1.16*  --   --  1.10* 1.09*  --  1.19* 1.18*  CALCIUM  --    < >  --  8.0*  --   --  8.3* 8.5*  --  8.5* 8.8*  AST  --   --   --   --   --  52* 45* 35  --  46* 30  ALT  --   --   --   --   --  33 29 30  --  34 32  ALKPHOS  --   --   --   --   --  41 40 50  --  60 63  BILITOT  --   --   --   --   --  1.0 0.6 0.8  --  1.2 1.0  ALBUMIN  --   --   --   --   --  2.6* 2.5* 2.5*  --  2.6* 2.6*  MG  --   --   --   --   --   --  2.1 2.2  --  2.2 2.2  CRP  --   --   --   --   --  12.0* 12.1* 8.3*  --  4.7* 3.3*  DDIMER  --   --   --   --   --  0.84* 0.74* 1.18*  --  13.89* 16.96*  PROCALCITON  --   --   --   --   --  <0.10 <0.10 <0.10  --  0.15  --   TSH  --   --  0.725  --   --   --   --   --   --   --   --   HGBA1C  --   --  6.3*  --   --   --   --   --   --   --   --   BNP 761.9*  --   --   --   --   --  237.8* 437.4*  --  269.1* 181.6*   < > = values in this interval not displayed.    Recent Labs  Lab 10/28/19 1515 10/28/19 1618 10/29/19 0956 10/30/19 0631 10/31/19 0503 11/01/19 0500 11/02/19 0500  CRP  --   --  12.0* 12.1* 8.3* 4.7* 3.3*  DDIMER  --   --  0.84* 0.74* 1.18* 13.89* 16.96*  BNP 761.9*  --   --  237.8* 437.4* 269.1* 181.6*  PROCALCITON  --   --  <0.10 <0.10 <0.10 0.15  --   SARSCOV2NAA  --  POSITIVE*  --   --   --   --   --     ------------------------------------------------------------------------------------------------------------------ No results for input(s): CHOL, HDL, LDLCALC, TRIG, CHOLHDL, LDLDIRECT in the last 72 hours.  Lab Results  Component Value Date   HGBA1C 6.3 (H) 10/28/2019   ------------------------------------------------------------------------------------------------------------------ No results for input(s): TSH, T4TOTAL, T3FREE, THYROIDAB in the last 72 hours.  Invalid input(s):  FREET3 ------------------------------------------------------------------------------------------------------------------ No results for input(s): VITAMINB12, FOLATE, FERRITIN, TIBC, IRON, RETICCTPCT in the last  72 hours.  Coagulation profile No results for input(s): INR, PROTIME in the last 168 hours.  Recent Labs    11/01/19 0500 11/02/19 0500  DDIMER 13.89* 16.96*    Cardiac Enzymes No results for input(s): CKMB, TROPONINI, MYOGLOBIN in the last 168 hours.  Invalid input(s): CK ------------------------------------------------------------------------------------------------------------------    Component Value Date/Time   BNP 181.6 (H) 11/02/2019 0500    Micro Results Recent Results (from the past 240 hour(s))  SARS CORONAVIRUS 2 (TAT 6-24 HRS) Nasopharyngeal Nasopharyngeal Swab     Status: Abnormal   Collection Time: 10/28/19  4:18 PM   Specimen: Nasopharyngeal Swab  Result Value Ref Range Status   SARS Coronavirus 2 POSITIVE (A) NEGATIVE Final    Comment: RESULT CALLED TO, READ BACK BY AND VERIFIED WITH: M. RUGGIERO,RN 0020 10/29/2019 T. TYSOR (NOTE) SARS-CoV-2 target nucleic acids are DETECTED. The SARS-CoV-2 RNA is generally detectable in upper and lower respiratory specimens during the acute phase of infection. Positive results are indicative of the presence of SARS-CoV-2 RNA. Clinical correlation with patient history and other diagnostic information is  necessary to determine patient infection status. Positive results do not rule out bacterial infection or co-infection with other viruses.  The expected result is Negative. Fact Sheet for Patients: SugarRoll.be Fact Sheet for Healthcare Providers: https://www.woods-mathews.com/ This test is not yet approved or cleared by the [place] FDA and  has been authorized for detection and/or diagnosis of SARS-CoV-2 by FDA under an Emergency Use Authorization (EUA). This EUA  will remain  in effect (meaning this test can be used) for  the duration of the COVID-19 declaration under Section 564(b)(1) of the Act, 21 U.S.C. section 360bbb-3(b)(1), unless the authorization is terminated or revoked sooner. Performed at [facility], [place] [address] [place], [place] [address]   Culture, blood (Routine X 2) w Reflex to ID Panel     Status: None (Preliminary result)   Collection Time: 11/01/19 12:17 AM   Specimen: BLOOD LEFT HAND  Result Value Ref Range Status   Specimen Description BLOOD LEFT HAND  Final   Special Requests   Final    BOTTLES DRAWN AEROBIC AND ANAEROBIC Blood Culture adequate volume   Culture   Final    NO GROWTH 1 DAY Performed at [facility], [place] [address], [place], [place] [address]    Report Status PENDING  Incomplete  Culture, blood (Routine X 2) w Reflex to ID Panel     Status: None (Preliminary result)   Collection Time: 11/01/19  1:02 AM   Specimen: BLOOD  Result Value Ref Range Status   Specimen Description BLOOD SITE NOT SPECIFIED  Final   Special Requests   Final    BOTTLES DRAWN AEROBIC AND ANAEROBIC Blood Culture adequate volume   Culture   Final    NO GROWTH 1 DAY Performed at [facility], [place] [address], [place], [place] [address]    Report Status PENDING  Incomplete    Radiology Reports DG Chest Port 1 View  Result Date: 11/02/2019 CLINICAL DATA:  Shortness of breath, COVID positive EXAM: PORTABLE CHEST 1 VIEW COMPARISON:  11/01/2019 FINDINGS: Diffuse bilateral pulmonary opacities. Worsening left lung aeration. No pneumothorax. Pneumomediastinum is similar. Stable cardiomediastinal contours. No significant pleural effusion. IMPRESSION: Bilateral pneumonia with worsening lung aeration on the left. Similar pneumomediastinum. Electronically Signed   By: Macy Mis M.D.   On: 11/02/2019 07:04   DG Chest Port 1 View  Result Date: 11/01/2019 CLINICAL DATA:  Acute  respiratory failure EXAM: PORTABLE CHEST 1  VIEW COMPARISON:  Yesterday FINDINGS: Pneumomediastinum is newly seen along both mediastinal contours. No visible pneumothorax. Extensive bilateral pneumonia. Cardiomegaly and aortic tortuosity. These results will be called to the ordering clinician or representative by the Radiologist Assistant, and communication documented in the PACS or [employer]. IMPRESSION: 1. New pneumomediastinum. 2. Stable bilateral pneumonia.  No visible pneumothorax. Electronically Signed   By: Monte Fantasia M.D.   On: 11/01/2019 07:21   DG CHEST PORT 1 VIEW  Result Date: 10/31/2019 CLINICAL DATA:  Tachypnea EXAM: PORTABLE CHEST 1 VIEW COMPARISON:  10/31/2019, 10/30/2019, 10/28/2019 FINDINGS: Rotated patient. Fairly extensive bilateral ground-glass opacities and consolidations probably without significant change allowing for rotated patient. Enlarged cardiomediastinal silhouette also exaggerated by rotation. No pleural effusion or pneumothorax. IMPRESSION: Likely no significant interval change in fairly extensive bilateral airspace disease and consolidations allowing for patient rotation Electronically Signed   By: Donavan Foil M.D.   On: 10/31/2019 19:56   DG Chest Port 1 View  Result Date: 10/31/2019 CLINICAL DATA:  Shortness of breath. COVID-19 viral pneumonia. EXAM: PORTABLE CHEST 1 VIEW COMPARISON:  02/21/2020 FINDINGS: Heart size remains within normal limits. Bilateral diffuse heterogeneous airspace opacity shows no significant change. No evidence of pneumothorax or pleural effusion. IMPRESSION: No significant change in diffuse heterogeneous airspace disease. Electronically Signed   By: Marlaine Hind M.D.   On: 10/31/2019 08:16   DG Chest Port 1 View  Result Date: 10/30/2019 CLINICAL DATA:  COVID positive. EXAM: PORTABLE CHEST 1 VIEW COMPARISON:  Chest x-ray dated October 28, 2019. FINDINGS: Stable cardiomediastinal silhouette. Normal pulmonary vascularity. Low lung volumes. Patchy interstitial and airspace  opacities throughout both lungs, mildly worsened at the left lung base. No pleural effusion or pneumothorax. No acute osseous abnormality. IMPRESSION: 1. Multifocal pneumonia, mildly worsened at the left lung base. Electronically Signed   By: Titus Dubin M.D.   On: 10/30/2019 08:26   DG Chest Portable 1 View  Result Date: 10/28/2019 CLINICAL DATA:  Dyspnea with exertion. EXAM: PORTABLE CHEST 1 VIEW COMPARISON:  February 28, 2017. FINDINGS: Stable cardiomediastinal silhouette. Central pulmonary vascular congestion is noted. Bilateral lung opacities are noted which may represent edema or possibly multifocal pneumonia. Atherosclerosis of thoracic aorta is noted. No pneumothorax or pleural effusion is noted. Bony thorax is unremarkable. IMPRESSION: Aortic atherosclerosis. Central pulmonary vascular congestion is noted. Bilateral lung opacities are noted which may represent edema or possibly multifocal pneumonia. Aortic Atherosclerosis (ICD10-I70.0). Electronically Signed   By: Marijo Conception M.D.   On: 10/28/2019 15:42   ECHOCARDIOGRAM COMPLETE  Result Date: 10/29/2019    ECHOCARDIOGRAM REPORT   Patient Name:   MARNETTE HAMEISTER Date of Exam: 10/29/2019 Medical Rec #:  [id number]      Height:       61.0 in Accession #:    [id number]     Weight:       270.0 lb Date of Birth:  21-Aug-1945      BSA:          2.146 m Patient Age:    17 years       BP:           136/73 mmHg Patient Gender: F              HR:           135 bpm. Exam Location:  Inpatient Procedure: 2D Echo, Cardiac Doppler and Color Doppler Indications:    I48.0 Paroxysmal atrial fibrillation  History:  Patient has no prior history of Echocardiogram examinations.                 COVID-19 Positive.  Sonographer:    Jonelle Sidle Dance Referring Phys: [place]  1. Normal LV function; mild LAE.  2. Left ventricular ejection fraction, by estimation, is 55 to 60%. The left ventricle has normal function. The left ventricle has  no regional wall motion abnormalities. Left ventricular diastolic function could not be evaluated.  3. Right ventricular systolic function is normal. The right ventricular size is normal. Tricuspid regurgitation signal is inadequate for assessing PA pressure.  4. Left atrial size was mildly dilated.  5. The mitral valve is normal in structure. Trivial mitral valve regurgitation. No evidence of mitral stenosis.  6. The aortic valve is tricuspid. Aortic valve regurgitation is not visualized. No aortic stenosis is present.  7. The inferior vena cava is normal in size with greater than 50% respiratory variability, suggesting right atrial pressure of 3 mmHg. FINDINGS  Left Ventricle: Left ventricular ejection fraction, by estimation, is 55 to 60%. The left ventricle has normal function. The left ventricle has no regional wall motion abnormalities. The left ventricular internal cavity size was normal in size. There is  no left ventricular hypertrophy. Left ventricular diastolic function could not be evaluated due to atrial fibrillation. Left ventricular diastolic function could not be evaluated. Right Ventricle: The right ventricular size is normal. Right ventricular systolic function is normal. Tricuspid regurgitation signal is inadequate for assessing PA pressure. Left Atrium: Left atrial size was mildly dilated. Right Atrium: Right atrial size was normal in size. Pericardium: There is no evidence of pericardial effusion. Mitral Valve: The mitral valve is normal in structure. Normal mobility of the mitral valve leaflets. Trivial mitral valve regurgitation. No evidence of mitral valve stenosis. Tricuspid Valve: The tricuspid valve is normal in structure. Tricuspid valve regurgitation is mild . No evidence of tricuspid stenosis. Aortic Valve: The aortic valve is tricuspid. Aortic valve regurgitation is not visualized. No aortic stenosis is present. Pulmonic Valve: The pulmonic valve was not well visualized. Pulmonic valve  regurgitation is trivial. No evidence of pulmonic stenosis. Aorta: The aortic root is normal in size and structure. Venous: The inferior vena cava is normal in size with greater than 50% respiratory variability, suggesting right atrial pressure of 3 mmHg.  Additional Comments: Normal LV function; mild LAE.  LEFT VENTRICLE PLAX 2D LVIDd:         4.00 cm LVIDs:         2.85 cm LV PW:         1.10 cm LV IVS:        1.13 cm LVOT diam:     1.85 cm LV SV:         40 LV SV Index:   19 LVOT Area:     2.69 cm  RIGHT VENTRICLE             IVC RV Basal diam:  2.82 cm     IVC diam: 1.67 cm RV S prime:     12.20 cm/s TAPSE (M-mode): 0.9 cm LEFT ATRIUM           Index       RIGHT ATRIUM           Index LA diam:      3.20 cm 1.49 cm/m  RA Area:     15.80 cm LA Vol (A2C): 74.0 ml 34.49 ml/m RA Volume:  39.80 ml  18.55 ml/m LA Vol (A4C): 61.6 ml 28.71 ml/m  AORTIC VALVE LVOT Vmax:   122.50 cm/s LVOT Vmean:  66.600 cm/s LVOT VTI:    0.150 m  AORTA Ao Root diam: 3.30 cm Ao Asc diam:  3.20 cm MITRAL VALVE MV Area (PHT): 3.62 cm    SHUNTS MV Decel Time: 210 msec    Systemic VTI:  0.15 m MV E velocity: 53.35 cm/s  Systemic Diam: 1.85 cm Kirk Ruths MD Electronically signed by Kirk Ruths MD Signature Date/Time: 10/29/2019/2:08:19 PM    Final     Time Spent in minutes  30   Lala Lund M.D on 11/02/2019 at 9:13 AM  To page go to [email or web address] - password [facility]

## 2019-11-02 NOTE — Progress Notes (Signed)
Assisted tele visit to patient with family member.  Kanasia Gayman P, RN  

## 2019-11-02 NOTE — Evaluation (Signed)
Clinical/Bedside Swallow Evaluation Patient Details  Name: Julia Mcfarland MRN: [id number] Date of Birth: October 28, 1945  Today's Date: 11/02/2019 Time: SLP Start Time (ACUTE ONLY): 1417 SLP Stop Time (ACUTE ONLY): 1432 SLP Time Calculation (min) (ACUTE ONLY): 15 min  Past Medical History:  Past Medical History:  Diagnosis Date  . Allergy   . Arthritis   . Chicken pox   . Chronic venous insufficiency    a. Uses furosemide prn.  . Degenerative joint disease   . Morbid obesity ([place])   . Osteoarthritis   . PONV (postoperative nausea and vomiting)    Past Surgical History:  Past Surgical History:  Procedure Laterality Date  . ABDOMINAL HYSTERECTOMY  1988  . CARPAL TUNNEL RELEASE Right   . REPLACEMENT TOTAL KNEE Right 2010  . 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  Hospital course complicated by encephalopathy, new onset Afib, pneumomediastinum. PMH includes: obesity, arthritis   Assessment / Plan / Recommendation Clinical Impression  At times pt takes some larger boluses, but she takes her time to prepare them well and does not exhibit any increase in WOB or obvious signs of fatigue across intake. No overt s/s of aspiration are noted. Recommend continuing current diet at this time. Would take breaks PRN for respirations, and if respiratory status or mentation were to decline any further, would reorder SLP. Will sign off for now. SLP Visit Diagnosis: Dysphagia, unspecified (R13.10)    Aspiration Risk  Mild aspiration risk    Diet Recommendation Dysphagia 3 (Mech soft);Thin liquid   Liquid Administration via: Cup;Straw Medication Administration: Whole meds with liquid Supervision: Staff to assist with self feeding;Intermittent supervision to cue for  compensatory strategies Compensations: Slow rate;Small sips/bites Postural Changes: Seated upright at 90 degrees    Other  Recommendations Oral Care Recommendations: Oral care BID   Follow up Recommendations None      Frequency and Duration            Prognosis Prognosis for Safe Diet Advancement: Good      Swallow Study   General HPI: Pt is a 74 yo female admitted with acute hypoxic respiratory failure secondary to COVID-19.  Hospital course complicated by encephalopathy, new onset Afib, pneumomediastinum. PMH includes: obesity, arthritis Type of Study: Bedside Swallow Evaluation Previous Swallow Assessment: none in chart Diet Prior to this Study: Dysphagia 3 (soft);Thin liquids Temperature Spikes Noted: No Respiratory Status: Nasal cannula(HFNC) History of Recent Intubation: No Behavior/Cognition: Alert;Cooperative;Pleasant mood Oral Cavity Assessment: Within Functional Limits Oral Care Completed by SLP: No Oral Cavity - Dentition: Adequate natural dentition Vision: Functional for self-feeding Self-Feeding Abilities: Able to feed self;Needs set up Patient Positioning: Upright in chair Baseline Vocal Quality: Normal Volitional Swallow: Able to elicit    Oral/Motor/Sensory Function Overall Oral Motor/Sensory Function: Within functional limits   Ice Chips Ice chips: Not tested   Thin Liquid Thin Liquid: Within functional limits Presentation: Self Fed;Straw    Nectar Thick Nectar Thick Liquid: Not tested   Honey Thick Honey Thick Liquid: Not tested   Puree Puree: Within functional limits Presentation: Self Fed;Spoon   Solid     Solid: Within functional limits Presentation: Self Fed       Osie Bond., M.A. [place] Pager [phone] Office [phone]  11/02/2019,2:38 PM

## 2019-11-03 ENCOUNTER — Inpatient Hospital Stay (HOSPITAL_COMMUNITY): Payer: Medicare HMO

## 2019-11-03 DIAGNOSIS — R7989 Other specified abnormal findings of blood chemistry: Secondary | ICD-10-CM

## 2019-11-03 LAB — D-DIMER, QUANTITATIVE: D-Dimer, Quant: 15.08 ug/mL-FEU — ABNORMAL HIGH (ref 0.00–0.50)

## 2019-11-03 LAB — CBC
HCT: 50 % — ABNORMAL HIGH (ref 36.0–46.0)
Hemoglobin: 16.2 g/dL — ABNORMAL HIGH (ref 12.0–15.0)
MCH: 29.9 pg (ref 26.0–34.0)
MCHC: 32.4 g/dL (ref 30.0–36.0)
MCV: 92.4 fL (ref 80.0–100.0)
Platelets: 404 10*3/uL — ABNORMAL HIGH (ref 150–400)
RBC: 5.41 MIL/uL — ABNORMAL HIGH (ref 3.87–5.11)
RDW: 12.8 % (ref 11.5–15.5)
WBC: 12.1 10*3/uL — ABNORMAL HIGH (ref 4.0–10.5)
nRBC: 0.6 % — ABNORMAL HIGH (ref 0.0–0.2)

## 2019-11-03 LAB — GLUCOSE, CAPILLARY
Glucose-Capillary: 102 mg/dL — ABNORMAL HIGH (ref 70–99)
Glucose-Capillary: 103 mg/dL — ABNORMAL HIGH (ref 70–99)
Glucose-Capillary: 109 mg/dL — ABNORMAL HIGH (ref 70–99)
Glucose-Capillary: 118 mg/dL — ABNORMAL HIGH (ref 70–99)
Glucose-Capillary: 155 mg/dL — ABNORMAL HIGH (ref 70–99)

## 2019-11-03 LAB — COMPREHENSIVE METABOLIC PANEL
ALT: 26 U/L (ref 0–44)
AST: 24 U/L (ref 15–41)
Albumin: 2.5 g/dL — ABNORMAL LOW (ref 3.5–5.0)
Alkaline Phosphatase: 62 U/L (ref 38–126)
Anion gap: 14 (ref 5–15)
BUN: 30 mg/dL — ABNORMAL HIGH (ref 8–23)
CO2: 31 mmol/L (ref 22–32)
Calcium: 8.5 mg/dL — ABNORMAL LOW (ref 8.9–10.3)
Chloride: 99 mmol/L (ref 98–111)
Creatinine, Ser: 1.24 mg/dL — ABNORMAL HIGH (ref 0.44–1.00)
GFR calc Af Amer: 50 mL/min — ABNORMAL LOW (ref >60)
GFR calc non Af Amer: 43 mL/min — ABNORMAL LOW (ref >60)
Glucose, Bld: 99 mg/dL (ref 70–99)
Potassium: 3.3 mmol/L — ABNORMAL LOW (ref 3.5–5.1)
Sodium: 144 mmol/L (ref 135–145)
Total Bilirubin: 1.1 mg/dL (ref 0.3–1.2)
Total Protein: 5.7 g/dL — ABNORMAL LOW (ref 6.5–8.1)

## 2019-11-03 LAB — BRAIN NATRIURETIC PEPTIDE: B Natriuretic Peptide: 82.2 pg/mL (ref 0.0–100.0)

## 2019-11-03 LAB — PROCALCITONIN: Procalcitonin: <0.1 ng/mL

## 2019-11-03 LAB — C-REACTIVE PROTEIN: CRP: 1.9 mg/dL — ABNORMAL HIGH (ref <1.0)

## 2019-11-03 LAB — HEPARIN ANTI-XA: Heparin LMW: 1.85 IU/mL

## 2019-11-03 LAB — MAGNESIUM: Magnesium: 2.3 mg/dL (ref 1.7–2.4)

## 2019-11-03 MED ORDER — LACTATED RINGERS IV SOLN: Freq: Once | INTRAVENOUS | Status: AC

## 2019-11-03 MED ORDER — POTASSIUM CHLORIDE CRYS ER 20 MEQ PO TBCR
40.0000 meq | EXTENDED_RELEASE_TABLET | Freq: Once | ORAL | Status: AC
  Administered 2019-11-03: 11:00:00 40 meq via ORAL
  Filled 2019-11-03: qty 2

## 2019-11-03 MED ORDER — SODIUM CHLORIDE 0.9 % IV BOLUS
500.0000 mL | Freq: Once | INTRAVENOUS | Status: AC
  Administered 2019-11-03: 500 mL via INTRAVENOUS

## 2019-11-03 MED ORDER — LACTATED RINGERS IV SOLN: INTRAVENOUS | Status: DC

## 2019-11-03 MED ORDER — POTASSIUM CHLORIDE CRYS ER 20 MEQ PO TBCR
20.0000 meq | EXTENDED_RELEASE_TABLET | Freq: Once | ORAL | Status: AC
  Administered 2019-11-03: 20 meq via ORAL
  Filled 2019-11-03: qty 1

## 2019-11-03 MED ORDER — LACTATED RINGERS IV BOLUS
500.0000 mL | Freq: Once | INTRAVENOUS | Status: AC
  Administered 2019-11-03: 500 mL via INTRAVENOUS

## 2019-11-03 MED ORDER — AMIODARONE HCL IN DEXTROSE 360-4.14 MG/200ML-% IV SOLN
30.0000 mg/h | INTRAVENOUS | Status: DC
  Filled 2019-11-03 (×2): qty 200

## 2019-11-03 MED ORDER — METOPROLOL TARTRATE 50 MG PO TABS: 50.0000 mg | ORAL_TABLET | Freq: Two times a day (BID) | ORAL | Status: DC

## 2019-11-03 MED ORDER — AMIODARONE HCL IN DEXTROSE 360-4.14 MG/200ML-% IV SOLN
60.0000 mg/h | INTRAVENOUS | Status: DC
  Administered 2019-11-03 (×2): 60 mg/h via INTRAVENOUS
  Filled 2019-11-03 (×2): qty 200

## 2019-11-03 MED ORDER — INSULIN ASPART 100 UNIT/ML ~~LOC~~ SOLN
0.0000 [IU] | Freq: Three times a day (TID) | SUBCUTANEOUS | Status: DC
  Administered 2019-11-04: 3 [IU] via SUBCUTANEOUS
  Administered 2019-11-05 – 2019-11-06 (×2): 4 [IU] via SUBCUTANEOUS
  Administered 2019-11-06: 11 [IU] via SUBCUTANEOUS
  Administered 2019-11-07 – 2019-11-12 (×4): 3 [IU] via SUBCUTANEOUS

## 2019-11-03 MED ORDER — DIGOXIN 0.25 MG/ML IJ SOLN
0.2500 mg | Freq: Once | INTRAMUSCULAR | Status: AC
  Administered 2019-11-03: 15:00:00 0.25 mg via INTRAVENOUS
  Filled 2019-11-03: qty 2

## 2019-11-03 MED ORDER — LACTATED RINGERS IV BOLUS
250.0000 mL | Freq: Once | INTRAVENOUS | Status: AC
  Administered 2019-11-03: 250 mL via INTRAVENOUS

## 2019-11-03 MED ORDER — AMIODARONE LOAD VIA INFUSION
150.0000 mg | Freq: Once | INTRAVENOUS | Status: AC
  Administered 2019-11-03: 150 mg via INTRAVENOUS
  Filled 2019-11-03: qty 83.34

## 2019-11-03 MED ORDER — DILTIAZEM LOAD VIA INFUSION
10.0000 mg | Freq: Once | INTRAVENOUS | Status: DC
  Filled 2019-11-03: qty 10

## 2019-11-03 NOTE — Progress Notes (Signed)
The patient's blood pressures are trending down.  The patient is still at baseline orientation as of transfer time. Bodenheimer, NP notified.  500 mL bolus has been ordered and started.  Will continue to monitor patient.

## 2019-11-03 NOTE — Progress Notes (Signed)
PROGRESS NOTE                                                                                                                                                                                                             [name], is a 74 y.o. female, DOB - 06-28-1946, [id number]  Admit date - 10/28/2019   Admitting Physician Sanda Klein, MD  Outpatient Primary MD for the patient is [name], NP  LOS - 6  Chief Complaint  Patient presents with   Tachycardia       Brief Narrative  - GENEVIENE Mcfarland is a 74 y.o. female with medical history significant of chronic venous insufficiency, arthritis, and morbid obesity presented complaints of palpitations, generalized malaise, and shortness of breath over the last 3 days.  Associated symptoms included chills, generalized weakness, and a nonproductive cough.  Upon admission she was found to be in A. fib with RVR with heart rates into the 170s.     She was admitted by cardiology team on 26 April subsequently found to have COVID-19 pneumonia and hospitalist team was consulted.   Subjective:   Patient in bed, appears comfortable, denies any headache, no fever, no chest pain or pressure, no shortness of breath , no abdominal pain. No focal weakness.   Assessment  & Plan :     1.  Acute hypoxic respiratory failure due to [id number] pneumonia complicated by pneumomediastinum.  She was initially treated for the first 2 to 3 days of hospitalization with IV steroids and remdesivir, however she continued to decline, she was transferred to my care on 10/30/2018 when she promptly received Actemra however by that time she had already progressed to heated high flow.  She seems to have overall stabilized with stabilization of inflammatory markers.  She was up to 40 L heated high flow currently she is on 25 L heated high flow with FiO2 of 80%.  She has developed a small pneumomediastinum on 10/31/2019 which will  be closely monitored, No signs of bacterial infection or esophageal rupture.  No chest or abdominal pain.  Gradually improving continue to monitor.  Overall extremely tenuous.  Monitor in ICU for now.   Recent Labs  Lab 10/28/19 1917 10/29/19 0450 10/30/19 0631 10/30/19 0631 10/31/19 0503 11/01/19 0044 11/01/19 0500 11/02/19 0500 11/03/19 0615  NA  --    < > 140   < > 141 143 144 145 144  K  --    < >  4.1   < > 4.2 4.0 4.1 3.4* 3.3*  CL  --    < > 104  --  102  --  105 102 99  CO2  --    < > 25  --  24  --  27 29 31   GLUCOSE  --    < > 196*  --  239*  --  229* 95 99  BUN  --    < > 24*  --  21  --  27* 27* 30*  CREATININE  --    < > 1.10*  --  1.09*  --  1.19* 1.18* 1.24*  CALCIUM  --    < > 8.3*  --  8.5*  --  8.5* 8.8* 8.5*  AST  --    < > 45*  --  35  --  46* 30 24  ALT  --    < > 29  --  30  --  34 32 26  ALKPHOS  --    < > 40  --  50  --  60 63 62  BILITOT  --    < > 0.6  --  0.8  --  1.2 1.0 1.1  ALBUMIN  --    < > 2.5*  --  2.5*  --  2.6* 2.6* 2.5*  MG  --   --  2.1  --  2.2  --  2.2 2.2 2.3  CRP  --    < > 12.1*  --  8.3*  --  4.7* 3.3* 1.9*  DDIMER  --    < > 0.74*  --  1.18*  --  13.89* 16.96* 15.08*  PROCALCITON  --    < > <0.10  --  <0.10  --  0.15 <0.10 <0.10  TSH 0.725  --   --   --   --   --   --   --   --   HGBA1C 6.3*  --   --   --   --   --   --   --   --   BNP  --   --  237.8*  --  437.4*  --  269.1* 181.6* 82.2   < > = values in this interval not displayed.    Recent Labs  Lab 10/28/19 1618 10/29/19 0956 10/30/19 0631 10/31/19 0503 11/01/19 0500 11/02/19 0500 11/03/19 0615  CRP  --    < > 12.1* 8.3* 4.7* 3.3* 1.9*  DDIMER  --    < > 0.74* 1.18* 13.89* 16.96* 15.08*  BNP  --   --  237.8* 437.4* 269.1* 181.6* 82.2  PROCALCITON  --    < > <0.10 <0.10 0.15 <0.10 <0.10  SARSCOV2NAA POSITIVE*  --   --   --   --   --   --    < > = values in this interval not displayed.      2.  Paroxysmal A. fib RVR.  Mali vas 2 score of greater than 3.  Cardiology  on board, TSH and echocardiogram, initially required IV amiodarone and digoxin, currently on oral Lopressor p.o. and IV as needed, Lovenox for anticoagulation for now as D-dimer is rapidly rising likely due to inflammation.  3.  Obesity with BMI of 51.  Follow with PCP for weight loss.  4.  AKI.  Currently seems to have resolved will monitor closely.  Appears slightly dehydrated on 11/03/2019 hydrate with IV fluids and monitor.  5.  Metabolic encephalopathy.  Supportive care, nighttime Seroquel.  [place] if needed.  Monitor.    6. Mild Acute on Chronic Diastolic CHF - rate control, after aggressive diuresis now seems to have lost all extra fluid and appears compensated.  7.  Hypokalemia.  replaced.    8.  Rising D-dimer despite being on Eliquis.  This is likely due to inflammation from COVID-19, hold Eliquis and transition to Lovenox, check lower extremity venous duplex - pending and monitor.    9. DM type II.  Lantus plus sliding scale monitor and adjust.  Steroid dose being tapered.  Lab Results  Component Value Date   HGBA1C 6.3 (H) 10/28/2019   CBG (last 3)  Recent Labs    11/02/19 2043 11/03/19 0006 11/03/19 0431  GLUCAP 211* 155* 102*      Family Communication  : Husband Julia Mcfarland [phone] on 10/30/2019, 10/31/19, 11/01/19 husband clearly explained that Ms. Jennavieve's situation remains quite critical.  Updated 11/03/2019  Code Status :  Full  Disposition Plan  : Stay inpatient in the hospital for treatment of severe acute hypoxic respiratory failure caused by COVID-19 pneumonia, currently on heated high flow oxygen along with IV fluids, A. fib RVR.  Once medically stable she will have to be reassessed where she is appropriate for discharge may require SNF.  Consults  : Cards  Procedures  :    TTE -  1. Normal LV function; mild LAE.  2. Left ventricular ejection fraction, by estimation, is 55 to 60%. The left ventricle has normal function. The left ventricle has no regional  wall motion abnormalities. Left ventricular diastolic function could not be evaluated.  3. Right ventricular systolic function is normal. The right ventricular size is normal. Tricuspid regurgitation signal is inadequate for assessing PA pressure.  4. Left atrial size was mildly dilated.  5. The mitral valve is normal in structure. Trivial mitral valve regurgitation. No evidence of mitral stenosis.  6. The aortic valve is tricuspid. Aortic valve regurgitation is not visualized. No aortic stenosis is present.  7. The inferior vena cava is normal in size with greater than 50% respiratory variability, suggesting right atrial pressure of 3 mmHg.  DVT Prophylaxis  :  Eliquis  Lab Results  Component Value Date   PLT 404 (H) 11/03/2019    Diet :  Diet Order            DIET SOFT Room service appropriate? Yes; Fluid consistency: Thin  Diet effective now               Inpatient Medications Scheduled Meds:  Chlorhexidine Gluconate Cloth  6 each Topical Daily   enoxaparin (LOVENOX) injection  115 mg Subcutaneous Q12H   insulin aspart  0-20 Units Subcutaneous Q4H   insulin detemir  15 Units Subcutaneous Daily   metoprolol tartrate  50 mg Oral BID   pantoprazole  40 mg Oral Daily   potassium chloride  40 mEq Oral Once   QUEtiapine  25 mg Oral QHS   Continuous Infusions:  lactated ringers     PRN Meds:.acetaminophen, albuterol, haloperidol lactate, metoprolol tartrate, ondansetron (ZOFRAN) IV  Antibiotics  :   Anti-infectives (From admission, onward)   Start     Dose/Rate Route Frequency Ordered Stop   10/30/19 1000  remdesivir 100 mg in sodium chloride 0.9 % 100 mL IVPB     100 mg 200 mL/hr over 30 Minutes Intravenous Daily 10/29/19 0914 11/02/19 0956   10/29/19 1000  remdesivir 200 mg in sodium chloride  0.9% 250 mL IVPB     200 mg 580 mL/hr over 30 Minutes Intravenous Once 10/29/19 0914 10/29/19 1236         Objective:   Vitals:   11/03/19 0500 11/03/19 0630  11/03/19 0746 11/03/19 0916  BP:  (!) 88/72 110/90   Pulse:  75 78   Resp:  (!) 22 (!) 24   Temp:   97.9 F (36.6 C)   TempSrc:   Oral   SpO2:  94% 93% 92%  Weight: 121.5 kg     Height:        SpO2: 92 % O2 Flow Rate (L/min): 22 L/min FiO2 (%): 80 %  Wt Readings from Last 3 Encounters:  11/03/19 121.5 kg  09/19/19 132 kg  02/18/19 127.9 kg     Intake/Output Summary (Last 24 hours) at 11/03/2019 1056 Last data filed at 11/02/2019 1730 Gross per 24 hour  Intake 240 ml  Output 295 ml  Net -55 ml     Physical Exam  Awake Alert, No new F.N deficits, Normal affect [place].AT,PERRAL Supple Neck,No JVD, No cervical lymphadenopathy appriciated.  Symmetrical Chest wall movement, Good air movement bilaterally, CTAB iRRR,No Gallops, Rubs or new Murmurs, No Parasternal Heave +ve B.Sounds, Abd Soft, No tenderness, No organomegaly appriciated, No rebound - guarding or rigidity. No Cyanosis, Clubbing or edema, No new Rash or bruise     Data Review:    Recent Labs  Lab 10/28/19 1511 10/28/19 1535 10/29/19 0450 10/30/19 0631 10/31/19 0503 11/01/19 0044 11/03/19 0615  WBC 4.0  --  3.9* 3.8* 8.1  --  12.1*  HGB 15.7*   < > 14.8 14.7 15.4* 16.0* 16.2*  HCT 49.5*   < > 46.1* 44.7 48.0* 47.0* 50.0*  PLT 206  --  206 283 378  --  404*  MCV 94.1  --  94.1 92.4 93.8  --  92.4  MCH 29.8  --  30.2 30.4 30.1  --  29.9  MCHC 31.7  --  32.1 32.9 32.1  --  32.4  RDW 12.9  --  12.9 12.9 12.9  --  12.8   < > = values in this interval not displayed.    Recent Labs  Lab 10/28/19 1917 10/29/19 0450 10/30/19 0631 10/30/19 0631 10/31/19 0503 11/01/19 0044 11/01/19 0500 11/02/19 0500 11/03/19 0615  NA  --    < > 140   < > 141 143 144 145 144  K  --    < > 4.1   < > 4.2 4.0 4.1 3.4* 3.3*  CL  --    < > 104  --  102  --  105 102 99  CO2  --    < > 25  --  24  --  27 29 31   GLUCOSE  --    < > 196*  --  239*  --  229* 95 99  BUN  --    < > 24*  --  21  --  27* 27* 30*  CREATININE  --     < > 1.10*  --  1.09*  --  1.19* 1.18* 1.24*  CALCIUM  --    < > 8.3*  --  8.5*  --  8.5* 8.8* 8.5*  AST  --    < > 45*  --  35  --  46* 30 24  ALT  --    < > 29  --  30  --  34 32 26  ALKPHOS  --    < > 40  --  50  --  60 63 62  BILITOT  --    < > 0.6  --  0.8  --  1.2 1.0 1.1  ALBUMIN  --    < > 2.5*  --  2.5*  --  2.6* 2.6* 2.5*  MG  --   --  2.1  --  2.2  --  2.2 2.2 2.3  CRP  --    < > 12.1*  --  8.3*  --  4.7* 3.3* 1.9*  DDIMER  --    < > 0.74*  --  1.18*  --  13.89* 16.96* 15.08*  PROCALCITON  --    < > <0.10  --  <0.10  --  0.15 <0.10 <0.10  TSH 0.725  --   --   --   --   --   --   --   --   HGBA1C 6.3*  --   --   --   --   --   --   --   --   BNP  --   --  237.8*  --  437.4*  --  269.1* 181.6* 82.2   < > = values in this interval not displayed.    Recent Labs  Lab 10/28/19 1618 10/29/19 0956 10/30/19 0631 10/31/19 0503 11/01/19 0500 11/02/19 0500 11/03/19 0615  CRP  --    < > 12.1* 8.3* 4.7* 3.3* 1.9*  DDIMER  --    < > 0.74* 1.18* 13.89* 16.96* 15.08*  BNP  --   --  237.8* 437.4* 269.1* 181.6* 82.2  PROCALCITON  --    < > <0.10 <0.10 0.15 <0.10 <0.10  SARSCOV2NAA POSITIVE*  --   --   --   --   --   --    < > = values in this interval not displayed.    ------------------------------------------------------------------------------------------------------------------ No results for input(s): CHOL, HDL, LDLCALC, TRIG, CHOLHDL, LDLDIRECT in the last 72 hours.  Lab Results  Component Value Date   HGBA1C 6.3 (H) 10/28/2019   ------------------------------------------------------------------------------------------------------------------ No results for input(s): TSH, T4TOTAL, T3FREE, THYROIDAB in the last 72 hours.  Invalid input(s): FREET3 ------------------------------------------------------------------------------------------------------------------ No results for input(s): VITAMINB12, FOLATE, FERRITIN, TIBC, IRON, RETICCTPCT in the last 72 hours.  Coagulation  profile No results for input(s): INR, PROTIME in the last 168 hours.  Recent Labs    11/02/19 0500 11/03/19 0615  DDIMER 16.96* 15.08*    Cardiac Enzymes No results for input(s): CKMB, TROPONINI, MYOGLOBIN in the last 168 hours.  Invalid input(s): CK ------------------------------------------------------------------------------------------------------------------    Component Value Date/Time   BNP 82.2 11/03/2019 0615    Micro Results Recent Results (from the past 240 hour(s))  SARS CORONAVIRUS 2 (TAT 6-24 HRS) Nasopharyngeal Nasopharyngeal Swab     Status: Abnormal   Collection Time: 10/28/19  4:18 PM   Specimen: Nasopharyngeal Swab  Result Value Ref Range Status   SARS Coronavirus 2 POSITIVE (A) NEGATIVE Final    Comment: RESULT CALLED TO, READ BACK BY AND VERIFIED WITH: M. RUGGIERO,RN 0020 10/29/2019 T. TYSOR (NOTE) SARS-CoV-2 target nucleic acids are DETECTED. The SARS-CoV-2 RNA is generally detectable in upper and lower respiratory specimens during the acute phase of infection. Positive results are indicative of the presence of SARS-CoV-2 RNA. Clinical correlation with patient history and other diagnostic information is  necessary to determine patient infection status. Positive results do not rule out bacterial infection or co-infection with other  viruses.  The expected result is Negative. Fact Sheet for Patients: SugarRoll.be Fact Sheet for Healthcare Providers: https://www.woods-mathews.com/ This test is not yet approved or cleared by the [place] FDA and  has been authorized for detection and/or diagnosis of SARS-CoV-2 by FDA under an Emergency Use Authorization (EUA). This EUA will remain  in effect (meaning this test can be used) for  the duration of the COVID-19 declaration under Section 564(b)(1) of the Act, 21 U.S.C. section 360bbb-3(b)(1), unless the authorization is terminated or revoked sooner. Performed at  [facility], [place] [address], [place], [place] [address]   Culture, blood (Routine X 2) w Reflex to ID Panel     Status: None (Preliminary result)   Collection Time: 11/01/19 12:17 AM   Specimen: BLOOD LEFT HAND  Result Value Ref Range Status   Specimen Description BLOOD LEFT HAND  Final   Special Requests   Final    BOTTLES DRAWN AEROBIC AND ANAEROBIC Blood Culture adequate volume   Culture   Final    NO GROWTH 2 DAYS Performed at [facility], [place] [address], [place], [place] [address]    Report Status PENDING  Incomplete  Culture, blood (Routine X 2) w Reflex to ID Panel     Status: None (Preliminary result)   Collection Time: 11/01/19  1:02 AM   Specimen: BLOOD  Result Value Ref Range Status   Specimen Description BLOOD SITE NOT SPECIFIED  Final   Special Requests   Final    BOTTLES DRAWN AEROBIC AND ANAEROBIC Blood Culture adequate volume   Culture   Final    NO GROWTH 2 DAYS Performed at [facility], [address], [place], [place] [address]    Report Status PENDING  Incomplete    Radiology Reports DG Chest Port 1 View  Result Date: 11/02/2019 CLINICAL DATA:  Shortness of breath, COVID positive EXAM: PORTABLE CHEST 1 VIEW COMPARISON:  11/01/2019 FINDINGS: Diffuse bilateral pulmonary opacities. Worsening left lung aeration. No pneumothorax. Pneumomediastinum is similar. Stable cardiomediastinal contours. No significant pleural effusion. IMPRESSION: Bilateral pneumonia with worsening lung aeration on the left. Similar pneumomediastinum. Electronically Signed   By: Macy Mis M.D.   On: 11/02/2019 07:04   DG Chest Port 1 View  Result Date: 11/01/2019 CLINICAL DATA:  Acute respiratory failure EXAM: PORTABLE CHEST 1 VIEW COMPARISON:  Yesterday FINDINGS: Pneumomediastinum is newly seen along both mediastinal contours. No visible pneumothorax. Extensive bilateral pneumonia. Cardiomegaly and aortic tortuosity. These results will be called to the ordering  clinician or representative by the Radiologist Assistant, and communication documented in the PACS or [employer]. IMPRESSION: 1. New pneumomediastinum. 2. Stable bilateral pneumonia.  No visible pneumothorax. Electronically Signed   By: Monte Fantasia M.D.   On: 11/01/2019 07:21   DG CHEST PORT 1 VIEW  Result Date: 10/31/2019 CLINICAL DATA:  Tachypnea EXAM: PORTABLE CHEST 1 VIEW COMPARISON:  10/31/2019, 10/30/2019, 10/28/2019 FINDINGS: Rotated patient. Fairly extensive bilateral ground-glass opacities and consolidations probably without significant change allowing for rotated patient. Enlarged cardiomediastinal silhouette also exaggerated by rotation. No pleural effusion or pneumothorax. IMPRESSION: Likely no significant interval change in fairly extensive bilateral airspace disease and consolidations allowing for patient rotation Electronically Signed   By: Donavan Foil M.D.   On: 10/31/2019 19:56   DG Chest Port 1 View  Result Date: 10/31/2019 CLINICAL DATA:  Shortness of breath. COVID-19 viral pneumonia. EXAM: PORTABLE CHEST 1 VIEW COMPARISON:  02/21/2020 FINDINGS: Heart size remains within normal limits. Bilateral diffuse  heterogeneous airspace opacity shows no significant change. No evidence of pneumothorax or pleural effusion. IMPRESSION: No significant change in diffuse heterogeneous airspace disease. Electronically Signed   By: Marlaine Hind M.D.   On: 10/31/2019 08:16   DG Chest Port 1 View  Result Date: 10/30/2019 CLINICAL DATA:  COVID positive. EXAM: PORTABLE CHEST 1 VIEW COMPARISON:  Chest x-ray dated October 28, 2019. FINDINGS: Stable cardiomediastinal silhouette. Normal pulmonary vascularity. Low lung volumes. Patchy interstitial and airspace opacities throughout both lungs, mildly worsened at the left lung base. No pleural effusion or pneumothorax. No acute osseous abnormality. IMPRESSION: 1. Multifocal pneumonia, mildly worsened at the left lung base. Electronically Signed   By:  Titus Dubin M.D.   On: 10/30/2019 08:26   DG Chest Portable 1 View  Result Date: 10/28/2019 CLINICAL DATA:  Dyspnea with exertion. EXAM: PORTABLE CHEST 1 VIEW COMPARISON:  February 28, 2017. FINDINGS: Stable cardiomediastinal silhouette. Central pulmonary vascular congestion is noted. Bilateral lung opacities are noted which may represent edema or possibly multifocal pneumonia. Atherosclerosis of thoracic aorta is noted. No pneumothorax or pleural effusion is noted. Bony thorax is unremarkable. IMPRESSION: Aortic atherosclerosis. Central pulmonary vascular congestion is noted. Bilateral lung opacities are noted which may represent edema or possibly multifocal pneumonia. Aortic Atherosclerosis (ICD10-I70.0). Electronically Signed   By: Marijo Conception M.D.   On: 10/28/2019 15:42   ECHOCARDIOGRAM COMPLETE  Result Date: 10/29/2019    ECHOCARDIOGRAM REPORT   Patient Name:   CARLEAH BARUT Date of Exam: 10/29/2019 Medical Rec #:  [id number]      Height:       61.0 in Accession #:    [id number]     Weight:       270.0 lb Date of Birth:  Aug 05, 1945      BSA:          2.146 m Patient Age:    19 years       BP:           136/73 mmHg Patient Gender: F              HR:           135 bpm. Exam Location:  Inpatient Procedure: 2D Echo, Cardiac Doppler and Color Doppler Indications:    I48.0 Paroxysmal atrial fibrillation  History:        Patient has no prior history of Echocardiogram examinations.                 COVID-19 Positive.  Sonographer:    Jonelle Sidle Dance Referring Phys: [place]  1. Normal LV function; mild LAE.  2. Left ventricular ejection fraction, by estimation, is 55 to 60%. The left ventricle has normal function. The left ventricle has no regional wall motion abnormalities. Left ventricular diastolic function could not be evaluated.  3. Right ventricular systolic function is normal. The right ventricular size is normal. Tricuspid regurgitation signal is inadequate for  assessing PA pressure.  4. Left atrial size was mildly dilated.  5. The mitral valve is normal in structure. Trivial mitral valve regurgitation. No evidence of mitral stenosis.  6. The aortic valve is tricuspid. Aortic valve regurgitation is not visualized. No aortic stenosis is present.  7. The inferior vena cava is normal in size with greater than 50% respiratory variability, suggesting right atrial pressure of 3 mmHg. FINDINGS  Left Ventricle: Left ventricular ejection fraction, by estimation, is 55 to 60%. The left ventricle has normal function. The left ventricle has  no regional wall motion abnormalities. The left ventricular internal cavity size was normal in size. There is  no left ventricular hypertrophy. Left ventricular diastolic function could not be evaluated due to atrial fibrillation. Left ventricular diastolic function could not be evaluated. Right Ventricle: The right ventricular size is normal. Right ventricular systolic function is normal. Tricuspid regurgitation signal is inadequate for assessing PA pressure. Left Atrium: Left atrial size was mildly dilated. Right Atrium: Right atrial size was normal in size. Pericardium: There is no evidence of pericardial effusion. Mitral Valve: The mitral valve is normal in structure. Normal mobility of the mitral valve leaflets. Trivial mitral valve regurgitation. No evidence of mitral valve stenosis. Tricuspid Valve: The tricuspid valve is normal in structure. Tricuspid valve regurgitation is mild . No evidence of tricuspid stenosis. Aortic Valve: The aortic valve is tricuspid. Aortic valve regurgitation is not visualized. No aortic stenosis is present. Pulmonic Valve: The pulmonic valve was not well visualized. Pulmonic valve regurgitation is trivial. No evidence of pulmonic stenosis. Aorta: The aortic root is normal in size and structure. Venous: The inferior vena cava is normal in size with greater than 50% respiratory variability, suggesting right atrial  pressure of 3 mmHg.  Additional Comments: Normal LV function; mild LAE.  LEFT VENTRICLE PLAX 2D LVIDd:         4.00 cm LVIDs:         2.85 cm LV PW:         1.10 cm LV IVS:        1.13 cm LVOT diam:     1.85 cm LV SV:         40 LV SV Index:   19 LVOT Area:     2.69 cm  RIGHT VENTRICLE             IVC RV Basal diam:  2.82 cm     IVC diam: 1.67 cm RV S prime:     12.20 cm/s TAPSE (M-mode): 0.9 cm LEFT ATRIUM           Index       RIGHT ATRIUM           Index LA diam:      3.20 cm 1.49 cm/m  RA Area:     15.80 cm LA Vol (A2C): 74.0 ml 34.49 ml/m RA Volume:   39.80 ml  18.55 ml/m LA Vol (A4C): 61.6 ml 28.71 ml/m  AORTIC VALVE LVOT Vmax:   122.50 cm/s LVOT Vmean:  66.600 cm/s LVOT VTI:    0.150 m  AORTA Ao Root diam: 3.30 cm Ao Asc diam:  3.20 cm MITRAL VALVE MV Area (PHT): 3.62 cm    SHUNTS MV Decel Time: 210 msec    Systemic VTI:  0.15 m MV E velocity: 53.35 cm/s  Systemic Diam: 1.85 cm Kirk Ruths MD Electronically signed by Kirk Ruths MD Signature Date/Time: 10/29/2019/2:08:19 PM    Final     Time Spent in minutes  30   Lala Lund M.D on 11/03/2019 at 10:56 AM  To page go to [email or web address] - password [facility]

## 2019-11-03 NOTE — Progress Notes (Signed)
Bilateral lower extremity venous duplex has been completed. Preliminary results can be found in CV Proc through chart review.  Results were given to the patient's nurse, Claiborne Billings.   11/03/19 1:46 PM Julia Mcfarland RVT

## 2019-11-04 ENCOUNTER — Inpatient Hospital Stay: Payer: Self-pay

## 2019-11-04 ENCOUNTER — Inpatient Hospital Stay (HOSPITAL_COMMUNITY): Payer: Medicare HMO

## 2019-11-04 DIAGNOSIS — I4891 Unspecified atrial fibrillation: Secondary | ICD-10-CM | POA: Diagnosis not present

## 2019-11-04 LAB — COMPREHENSIVE METABOLIC PANEL
ALT: 23 U/L (ref 0–44)
AST: 25 U/L (ref 15–41)
Albumin: 2.4 g/dL — ABNORMAL LOW (ref 3.5–5.0)
Alkaline Phosphatase: 58 U/L (ref 38–126)
Anion gap: 11 (ref 5–15)
BUN: 24 mg/dL — ABNORMAL HIGH (ref 8–23)
CO2: 32 mmol/L (ref 22–32)
Calcium: 7.9 mg/dL — ABNORMAL LOW (ref 8.9–10.3)
Chloride: 98 mmol/L (ref 98–111)
Creatinine, Ser: 1.14 mg/dL — ABNORMAL HIGH (ref 0.44–1.00)
GFR calc Af Amer: 56 mL/min — ABNORMAL LOW (ref >60)
GFR calc non Af Amer: 48 mL/min — ABNORMAL LOW (ref >60)
Glucose, Bld: 163 mg/dL — ABNORMAL HIGH (ref 70–99)
Potassium: 3.3 mmol/L — ABNORMAL LOW (ref 3.5–5.1)
Sodium: 141 mmol/L (ref 135–145)
Total Bilirubin: 1 mg/dL (ref 0.3–1.2)
Total Protein: 5.3 g/dL — ABNORMAL LOW (ref 6.5–8.1)

## 2019-11-04 LAB — GLUCOSE, CAPILLARY
Glucose-Capillary: 112 mg/dL — ABNORMAL HIGH (ref 70–99)
Glucose-Capillary: 113 mg/dL — ABNORMAL HIGH (ref 70–99)
Glucose-Capillary: 139 mg/dL — ABNORMAL HIGH (ref 70–99)
Glucose-Capillary: 145 mg/dL — ABNORMAL HIGH (ref 70–99)

## 2019-11-04 LAB — BRAIN NATRIURETIC PEPTIDE: B Natriuretic Peptide: 74.4 pg/mL (ref 0.0–100.0)

## 2019-11-04 LAB — CBC
HCT: 52.1 % — ABNORMAL HIGH (ref 36.0–46.0)
Hemoglobin: 16.7 g/dL — ABNORMAL HIGH (ref 12.0–15.0)
MCH: 30 pg (ref 26.0–34.0)
MCHC: 32.1 g/dL (ref 30.0–36.0)
MCV: 93.5 fL (ref 80.0–100.0)
Platelets: 375 10*3/uL (ref 150–400)
RBC: 5.57 MIL/uL — ABNORMAL HIGH (ref 3.87–5.11)
RDW: 12.9 % (ref 11.5–15.5)
WBC: 8.6 10*3/uL (ref 4.0–10.5)
nRBC: 0.5 % — ABNORMAL HIGH (ref 0.0–0.2)

## 2019-11-04 LAB — MAGNESIUM: Magnesium: 2.2 mg/dL (ref 1.7–2.4)

## 2019-11-04 LAB — D-DIMER, QUANTITATIVE: D-Dimer, Quant: 7.92 ug/mL-FEU — ABNORMAL HIGH (ref 0.00–0.50)

## 2019-11-04 LAB — PROCALCITONIN: Procalcitonin: <0.1 ng/mL

## 2019-11-04 LAB — C-REACTIVE PROTEIN: CRP: 0.9 mg/dL

## 2019-11-04 MED ORDER — DILTIAZEM HCL 60 MG PO TABS
90.0000 mg | ORAL_TABLET | Freq: Three times a day (TID) | ORAL | Status: DC
  Administered 2019-11-04: 21:00:00 90 mg via ORAL
  Administered 2019-11-04: 30 mg via ORAL
  Administered 2019-11-05 – 2019-11-09 (×13): 90 mg via ORAL
  Filled 2019-11-04 (×15): qty 2

## 2019-11-04 MED ORDER — DILTIAZEM HCL 60 MG PO TABS
60.0000 mg | ORAL_TABLET | Freq: Three times a day (TID) | ORAL | Status: DC
  Administered 2019-11-04 (×2): 60 mg via ORAL
  Filled 2019-11-04 (×3): qty 1

## 2019-11-04 MED ORDER — SODIUM CHLORIDE 0.9 % IV BOLUS: 250.0000 mL | Freq: Once | INTRAVENOUS | Status: DC

## 2019-11-04 MED ORDER — SODIUM CHLORIDE 0.9 % IV BOLUS
500.0000 mL | Freq: Once | INTRAVENOUS | Status: AC
  Administered 2019-11-04: 500 mL via INTRAVENOUS

## 2019-11-04 MED ORDER — SODIUM CHLORIDE 0.9% FLUSH: 10.0000 mL | INTRAVENOUS | Status: DC | PRN

## 2019-11-04 MED ORDER — AMIODARONE HCL 200 MG PO TABS
200.0000 mg | ORAL_TABLET | Freq: Two times a day (BID) | ORAL | Status: DC
  Administered 2019-11-04 – 2019-11-12 (×18): 200 mg via ORAL
  Filled 2019-11-04 (×20): qty 1

## 2019-11-04 MED ORDER — SODIUM CHLORIDE 0.9% FLUSH
10.0000 mL | INTRAVENOUS | Status: DC | PRN
  Administered 2019-11-08: 10 mL

## 2019-11-04 MED ORDER — SODIUM CHLORIDE 0.9% FLUSH
10.0000 mL | Freq: Two times a day (BID) | INTRAVENOUS | Status: DC
  Administered 2019-11-04: 10 mL

## 2019-11-04 NOTE — Progress Notes (Addendum)
PROGRESS NOTE                                                                                                                                                                                                             [name], is a 74 y.o. female, DOB - 09-25-1945, [id number]  Admit date - 10/28/2019   Admitting Physician Sanda Klein, MD  Outpatient Primary MD for the patient is Baity, Coralie Keens, NP  LOS - 7  Chief Complaint  Patient presents with  . Tachycardia       Brief Narrative  - Julia Mcfarland is a 73 y.o. female with medical history significant of chronic venous insufficiency, arthritis, and morbid obesity presented complaints of palpitations, generalized malaise, and shortness of breath over the last 3 days.  Associated symptoms included chills, generalized weakness, and a nonproductive cough.  Upon admission she was found to be in A. fib with RVR with heart rates into the 170s.     She was admitted by cardiology team on 26 April subsequently found to have COVID-19 pneumonia and hospitalist team was consulted.   Subjective:   Patient in bed, appears comfortable, denies any headache, no fever, no chest pain or pressure, improved shortness of breath , no abdominal pain. No focal weakness.    Assessment  & Plan :     1.  Acute hypoxic respiratory failure due to [id number] pneumonia complicated by pneumomediastinum.  She was initially treated for the first 2 to 3 days of hospitalization with IV steroids and remdesivir, however she continued to decline, she was transferred to my care on 10/30/2018 when she promptly received Actemra however by that time she had already progressed to heated high flow.  She seems to have overall stabilized with stabilization of inflammatory markers.  She was up to 40 L heated high flow currently she is on 25 L heated high flow with FiO2 of 80%.  She has developed a small pneumomediastinum on 10/31/2019  which will be closely monitored, No signs of bacterial infection or esophageal rupture.  No chest or abdominal pain.  Gradually improving continue to monitor.  Overall extremely tenuous.  Monitor in ICU for now.   Recent Labs  Lab 10/28/19 1917 10/29/19 0450 10/30/19 0631 10/30/19 0631 10/31/19 0503 11/01/19 0044 11/01/19 0500 11/02/19 0500 11/03/19 0615 11/04/19 0500  NA  --    < > 140   < > 141 143 144 145 144  --  K  --    < > 4.1   < > 4.2 4.0 4.1 3.4* 3.3*  --   CL  --    < > 104  --  102  --  105 102 99  --   CO2  --    < > 25  --  24  --  27 29 31   --   GLUCOSE  --    < > 196*  --  239*  --  229* 95 99  --   BUN  --    < > 24*  --  21  --  27* 27* 30*  --   CREATININE  --    < > 1.10*  --  1.09*  --  1.19* 1.18* 1.24*  --   CALCIUM  --    < > 8.3*  --  8.5*  --  8.5* 8.8* 8.5*  --   AST  --    < > 45*  --  35  --  46* 30 24  --   ALT  --    < > 29  --  30  --  34 32 26  --   ALKPHOS  --    < > 40  --  50  --  60 63 62  --   BILITOT  --    < > 0.6  --  0.8  --  1.2 1.0 1.1  --   ALBUMIN  --    < > 2.5*  --  2.5*  --  2.6* 2.6* 2.5*  --   MG  --   --  2.1  --  2.2  --  2.2 2.2 2.3  --   CRP  --    < > 12.1*  --  8.3*  --  4.7* 3.3* 1.9*  --   DDIMER  --    < > 0.74*  --  1.18*  --  13.89* 16.96* 15.08*  --   PROCALCITON  --    < > <0.10  --  <0.10  --  0.15 <0.10 <0.10  --   TSH 0.725  --   --   --   --   --   --   --   --   --   HGBA1C 6.3*  --   --   --   --   --   --   --   --   --   BNP  --   --  237.8*   < > 437.4*  --  269.1* 181.6* 82.2 74.4   < > = values in this interval not displayed.    Recent Labs  Lab 10/28/19 1618 10/29/19 0956 10/30/19 0631 10/30/19 0631 10/31/19 0503 11/01/19 0500 11/02/19 0500 11/03/19 0615 11/04/19 0500  CRP  --    < > 12.1*  --  8.3* 4.7* 3.3* 1.9*  --   DDIMER  --    < > 0.74*  --  1.18* 13.89* 16.96* 15.08*  --   BNP  --   --  237.8*   < > 437.4* 269.1* 181.6* 82.2 74.4  PROCALCITON  --    < > <0.10  --  <0.10 0.15  <0.10 <0.10  --   SARSCOV2NAA POSITIVE*  --   --   --   --   --   --   --   --    < > = values in this interval not displayed.  Lab Results  Component Value Date   TSH 0.725 10/28/2019    2.  Paroxysmal A. fib RVR.  Mali vas 2 score of greater than 3.  Cardiology on board, stable TSH and echocardiogram, initially required IV amiodarone and digoxin, currently transition to oral amiodarone, oral Cardizem dose increased on 11/04/2019 for better control, continue anticoagulation with Julia Mcfarland.  3.  Obesity with BMI of 51.  Follow with PCP for weight loss.  4.  AKI.  Currently seems to have resolved will monitor closely.  Appears slightly dehydrated on 11/03/2019 hydrate with IV fluids and monitor.  5.  Metabolic encephalopathy.  Supportive care, nighttime Seroquel.  [place] if needed.  Monitor.    6. 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  7.  Hypokalemia.  replaced.    8.  Rising D-dimer despite being on Eliquis.  This is likely due to inflammation from COVID-19, leg ultrasound is negative, for now Lovenox till D-dimer starts trending down thereafter switch back to Eliquis.    9. DM type II.  Lantus plus sliding scale monitor and adjust.  Steroid dose being tapered.  Lab Results  Component Value Date   HGBA1C 6.3 (H) 10/28/2019   CBG (last 3)  Recent Labs    11/03/19 2045 11/04/19 0727 11/04/19 1232  GLUCAP 103* 112* 113*      Family Communication  : Husband [name] [phone] on 10/30/2019, 10/31/19, 11/01/19 husband clearly explained that Julia Mcfarland's situation remains quite critical.  Updated 11/03/2019, message left on 11/04/2019 at 2:33 PM.  Code Status :  Full  Disposition Plan  : Stay inpatient in the hospital for treatment of severe acute hypoxic respiratory failure caused by COVID-19 pneumonia,  currently on heated high flow oxygen along with IV fluids, A. fib RVR.  Once medically stable she will have to be reassessed where she is appropriate for discharge may require SNF.  Consults  : Cards  Procedures  :    TTE -  1. Normal LV function; mild LAE.  2. Left ventricular ejection fraction, by estimation, is 55 to 60%. The left ventricle has normal function. The left ventricle has no regional wall motion abnormalities. Left ventricular diastolic function could not be evaluated.  3. Right ventricular systolic function is normal. The right ventricular size is normal. Tricuspid regurgitation signal is inadequate for assessing PA pressure.  4. Left atrial size was mildly dilated.  5. The mitral valve is normal in structure. Trivial mitral valve regurgitation. No evidence of mitral stenosis.  6. The aortic valve is tricuspid. Aortic valve regurgitation is not visualized. No aortic stenosis is present.  7. The inferior vena cava is normal in size with greater than 50% respiratory variability, suggesting right atrial pressure of 3 mmHg.  DVT Prophylaxis  :  Eliquis  Lab Results  Component Value Date   PLT 375 11/04/2019    Diet :  Diet Order            DIET SOFT Room service appropriate? Yes; Fluid consistency: Thin  Diet effective now               Inpatient Medications Scheduled Meds: . amiodarone  200 mg Oral BID  . Chlorhexidine Gluconate Cloth  6 each Topical Daily  . diltiazem  90 mg Oral Q8H  . enoxaparin (LOVENOX) injection  115 mg  Subcutaneous Q12H  . insulin aspart  0-20 Units Subcutaneous TID WC  . insulin detemir  15 Units Subcutaneous Daily  . pantoprazole  40 mg Oral Daily  . QUEtiapine  25 mg Oral QHS   Continuous Infusions:  PRN Meds:.acetaminophen, albuterol, metoprolol tartrate, ondansetron (ZOFRAN) IV  Antibiotics  :   Anti-infectives (From admission, onward)   Start     Dose/Rate Route Frequency Ordered Stop   10/30/19 1000  remdesivir 100  mg in sodium chloride 0.9 % 100 mL IVPB     100 mg 200 mL/hr over 30 Minutes Intravenous Daily 10/29/19 0914 11/02/19 0956   10/29/19 1000  remdesivir 200 mg in sodium chloride 0.9% 250 mL IVPB     200 mg 580 mL/hr over 30 Minutes Intravenous Once 10/29/19 0914 10/29/19 1236         Objective:   Vitals:   11/04/19 1200 11/04/19 1250 11/04/19 1330 11/04/19 1425  BP: 101/66 100/69 131/69   Pulse: 78 80 90   Resp:  (!) 24 (!) 28   Temp:  98 F (36.7 C) 98 F (36.7 C)   TempSrc:  Oral Oral   SpO2: 98% 98% 98% 92%  Weight:      Height:        SpO2: 92 % O2 Flow Rate (L/min): 25 L/min FiO2 (%): 80 %  Wt Readings from Last 3 Encounters:  11/04/19 122.5 kg  09/19/19 132 kg  02/18/19 127.9 kg     Intake/Output Summary (Last 24 hours) at 11/04/2019 1429 Last data filed at 11/04/2019 1257 Gross per 24 hour  Intake 911.83 ml  Output 2150 ml  Net -1238.17 ml     Physical Exam  Awake Alert, No new F.N deficits, Normal affect [place].AT,PERRAL Supple Neck,No JVD, No cervical lymphadenopathy appriciated.  Symmetrical Chest wall movement, Good air movement bilaterally, CTAB iRRR,No Gallops, Rubs or new Murmurs, No Parasternal Heave +ve B.Sounds, Abd Soft, No tenderness, No organomegaly appriciated, No rebound - guarding or rigidity. No Cyanosis, Clubbing or edema, No new Rash or bruise    Data Review:    Recent Labs  Lab 10/29/19 0450 10/29/19 0450 10/30/19 0631 10/31/19 0503 11/01/19 0044 11/03/19 0615 11/04/19 0742  WBC 3.9*  --  3.8* 8.1  --  12.1* 8.6  HGB 14.8   < > 14.7 15.4* 16.0* 16.2* 16.7*  HCT 46.1*   < > 44.7 48.0* 47.0* 50.0* 52.1*  PLT 206  --  283 378  --  404* 375  MCV 94.1  --  92.4 93.8  --  92.4 93.5  MCH 30.2  --  30.4 30.1  --  29.9 30.0  MCHC 32.1  --  32.9 32.1  --  32.4 32.1  RDW 12.9  --  12.9 12.9  --  12.8 12.9   < > = values in this interval not displayed.    Recent Labs  Lab 10/28/19 1917 10/29/19 0450 10/30/19 0631 10/30/19 0631  10/31/19 0503 11/01/19 0044 11/01/19 0500 11/02/19 0500 11/03/19 0615 11/04/19 0500  NA  --    < > 140   < > 141 143 144 145 144  --   K  --    < > 4.1   < > 4.2 4.0 4.1 3.4* 3.3*  --   CL  --    < > 104  --  102  --  105 102 99  --   CO2  --    < > 25  --  24  --  27 29 31   --   GLUCOSE  --    < > 196*  --  239*  --  229* 95 99  --   BUN  --    < > 24*  --  21  --  27* 27* 30*  --   CREATININE  --    < > 1.10*  --  1.09*  --  1.19* 1.18* 1.24*  --   CALCIUM  --    < > 8.3*  --  8.5*  --  8.5* 8.8* 8.5*  --   AST  --    < > 45*  --  35  --  46* 30 24  --   ALT  --    < > 29  --  30  --  34 32 26  --   ALKPHOS  --    < > 40  --  50  --  60 63 62  --   BILITOT  --    < > 0.6  --  0.8  --  1.2 1.0 1.1  --   ALBUMIN  --    < > 2.5*  --  2.5*  --  2.6* 2.6* 2.5*  --   MG  --   --  2.1  --  2.2  --  2.2 2.2 2.3  --   CRP  --    < > 12.1*  --  8.3*  --  4.7* 3.3* 1.9*  --   DDIMER  --    < > 0.74*  --  1.18*  --  13.89* 16.96* 15.08*  --   PROCALCITON  --    < > <0.10  --  <0.10  --  0.15 <0.10 <0.10  --   TSH 0.725  --   --   --   --   --   --   --   --   --   HGBA1C 6.3*  --   --   --   --   --   --   --   --   --   BNP  --   --  237.8*   < > 437.4*  --  269.1* 181.6* 82.2 74.4   < > = values in this interval not displayed.    Recent Labs  Lab 10/28/19 1618 10/29/19 0956 10/30/19 0631 10/30/19 0631 10/31/19 0503 11/01/19 0500 11/02/19 0500 11/03/19 0615 11/04/19 0500  CRP  --    < > 12.1*  --  8.3* 4.7* 3.3* 1.9*  --   DDIMER  --    < > 0.74*  --  1.18* 13.89* 16.96* 15.08*  --   BNP  --   --  237.8*   < > 437.4* 269.1* 181.6* 82.2 74.4  PROCALCITON  --    < > <0.10  --  <0.10 0.15 <0.10 <0.10  --   SARSCOV2NAA POSITIVE*  --   --   --   --   --   --   --   --    < > = values in this interval not displayed.    ------------------------------------------------------------------------------------------------------------------ No results for input(s): CHOL, HDL, LDLCALC, TRIG,  CHOLHDL, LDLDIRECT in the last 72 hours.  Lab Results  Component Value Date   HGBA1C 6.3 (H) 10/28/2019   ------------------------------------------------------------------------------------------------------------------ No results for input(s): TSH, T4TOTAL, T3FREE, THYROIDAB in the last 72 hours.  Invalid input(s): FREET3 ------------------------------------------------------------------------------------------------------------------ No results for input(s): VITAMINB12, FOLATE, FERRITIN, TIBC, IRON,  RETICCTPCT in the last 72 hours.  Coagulation profile No results for input(s): INR, PROTIME in the last 168 hours.  Recent Labs    11/02/19 0500 11/03/19 0615  DDIMER 16.96* 15.08*    Cardiac Enzymes No results for input(s): CKMB, TROPONINI, MYOGLOBIN in the last 168 hours.  Invalid input(s): CK ------------------------------------------------------------------------------------------------------------------    Component Value Date/Time   BNP 74.4 11/04/2019 0500    Micro Results Recent Results (from the past 240 hour(s))  SARS CORONAVIRUS 2 (TAT 6-24 HRS) Nasopharyngeal Nasopharyngeal Swab     Status: Abnormal   Collection Time: 10/28/19  4:18 PM   Specimen: Nasopharyngeal Swab  Result Value Ref Range Status   SARS Coronavirus 2 POSITIVE (A) NEGATIVE Final    Comment: RESULT CALLED TO, READ BACK BY AND VERIFIED WITH: M. RUGGIERO,RN 0020 10/29/2019 T. TYSOR (NOTE) SARS-CoV-2 target nucleic acids are DETECTED. The SARS-CoV-2 RNA is generally detectable in upper and lower respiratory specimens during the acute phase of infection. Positive results are indicative of the presence of SARS-CoV-2 RNA. Clinical correlation with patient history and other diagnostic information is  necessary to determine patient infection status. Positive results do not rule out bacterial infection or co-infection with other viruses.  The expected result is Negative. Fact Sheet for  Patients: SugarRoll.be Fact Sheet for Healthcare Providers: https://www.woods-mathews.com/ This test is not yet approved or cleared by the [place] FDA and  has been authorized for detection and/or diagnosis of SARS-CoV-2 by FDA under an Emergency Use Authorization (EUA). This EUA will remain  in effect (meaning this test can be used) for  the duration of the COVID-19 declaration under Section 564(b)(1) of the Act, 21 U.S.C. section 360bbb-3(b)(1), unless the authorization is terminated or revoked sooner. Performed at [facility], [place] [address], [place], [place] [address]   Culture, blood (Routine X 2) w Reflex to ID Panel     Status: None (Preliminary result)   Collection Time: 11/01/19 12:17 AM   Specimen: BLOOD LEFT HAND  Result Value Ref Range Status   Specimen Description BLOOD LEFT HAND  Final   Special Requests   Final    BOTTLES DRAWN AEROBIC AND ANAEROBIC Blood Culture adequate volume   Culture   Final    NO GROWTH 3 DAYS Performed at [facility], [place] [address] [place], [place] [address]    Report Status PENDING  Incomplete  Culture, blood (Routine X 2) w Reflex to ID Panel     Status: None (Preliminary result)   Collection Time: 11/01/19  1:02 AM   Specimen: BLOOD  Result Value Ref Range Status   Specimen Description BLOOD SITE NOT SPECIFIED  Final   Special Requests   Final    BOTTLES DRAWN AEROBIC AND ANAEROBIC Blood Culture adequate volume   Culture   Final    NO GROWTH 3 DAYS Performed at [facility], [address], [place], [place] [address]    Report Status PENDING  Incomplete    Radiology Reports DG Chest Port 1 View  Result Date: 11/04/2019 CLINICAL DATA:  Short of breath. EXAM: PORTABLE CHEST 1 VIEW COMPARISON:  11/02/2019 FINDINGS: Normal heart size. Extensive, bilateral pulmonary opacities are identified which appear unchanged from previous exam. No pneumothorax. Subcutaneous emphysema  is identified within the right neck. Etiology indeterminate. IMPRESSION: 1. No change in aeration a lungs compared with previous exam. 2. Right neck subcu emphysema noted. Electronically Signed   By: Kerby Moors M.D.   On: 11/04/2019 09:33  DG Chest Port 1 View  Result Date: 11/02/2019 CLINICAL DATA:  Shortness of breath, COVID positive EXAM: PORTABLE CHEST 1 VIEW COMPARISON:  11/01/2019 FINDINGS: Diffuse bilateral pulmonary opacities. Worsening left lung aeration. No pneumothorax. Pneumomediastinum is similar. Stable cardiomediastinal contours. No significant pleural effusion. IMPRESSION: Bilateral pneumonia with worsening lung aeration on the left. Similar pneumomediastinum. Electronically Signed   By: Macy Mis M.D.   On: 11/02/2019 07:04   DG Chest Port 1 View  Result Date: 11/01/2019 CLINICAL DATA:  Acute respiratory failure EXAM: PORTABLE CHEST 1 VIEW COMPARISON:  Yesterday FINDINGS: Pneumomediastinum is newly seen along both mediastinal contours. No visible pneumothorax. Extensive bilateral pneumonia. Cardiomegaly and aortic tortuosity. These results will be called to the ordering clinician or representative by the Radiologist Assistant, and communication documented in the PACS or [employer]. IMPRESSION: 1. New pneumomediastinum. 2. Stable bilateral pneumonia.  No visible pneumothorax. Electronically Signed   By: Monte Fantasia M.D.   On: 11/01/2019 07:21   DG CHEST PORT 1 VIEW  Result Date: 10/31/2019 CLINICAL DATA:  Tachypnea EXAM: PORTABLE CHEST 1 VIEW COMPARISON:  10/31/2019, 10/30/2019, 10/28/2019 FINDINGS: Rotated patient. Fairly extensive bilateral ground-glass opacities and consolidations probably without significant change allowing for rotated patient. Enlarged cardiomediastinal silhouette also exaggerated by rotation. No pleural effusion or pneumothorax. IMPRESSION: Likely no significant interval change in fairly extensive bilateral airspace disease and consolidations  allowing for patient rotation Electronically Signed   By: Donavan Foil M.D.   On: 10/31/2019 19:56   DG Chest Port 1 View  Result Date: 10/31/2019 CLINICAL DATA:  Shortness of breath. COVID-19 viral pneumonia. EXAM: PORTABLE CHEST 1 VIEW COMPARISON:  02/21/2020 FINDINGS: Heart size remains within normal limits. Bilateral diffuse heterogeneous airspace opacity shows no significant change. No evidence of pneumothorax or pleural effusion. IMPRESSION: No significant change in diffuse heterogeneous airspace disease. Electronically Signed   By: Marlaine Hind M.D.   On: 10/31/2019 08:16   DG Chest Port 1 View  Result Date: 10/30/2019 CLINICAL DATA:  COVID positive. EXAM: PORTABLE CHEST 1 VIEW COMPARISON:  Chest x-ray dated October 28, 2019. FINDINGS: Stable cardiomediastinal silhouette. Normal pulmonary vascularity. Low lung volumes. Patchy interstitial and airspace opacities throughout both lungs, mildly worsened at the left lung base. No pleural effusion or pneumothorax. No acute osseous abnormality. IMPRESSION: 1. Multifocal pneumonia, mildly worsened at the left lung base. Electronically Signed   By: Titus Dubin M.D.   On: 10/30/2019 08:26   DG Chest Portable 1 View  Result Date: 10/28/2019 CLINICAL DATA:  Dyspnea with exertion. EXAM: PORTABLE CHEST 1 VIEW COMPARISON:  February 28, 2017. FINDINGS: Stable cardiomediastinal silhouette. Central pulmonary vascular congestion is noted. Bilateral lung opacities are noted which may represent edema or possibly multifocal pneumonia. Atherosclerosis of thoracic aorta is noted. No pneumothorax or pleural effusion is noted. Bony thorax is unremarkable. IMPRESSION: Aortic atherosclerosis. Central pulmonary vascular congestion is noted. Bilateral lung opacities are noted which may represent edema or possibly multifocal pneumonia. Aortic Atherosclerosis (ICD10-I70.0). Electronically Signed   By: Marijo Conception M.D.   On: 10/28/2019 15:42   ECHOCARDIOGRAM  COMPLETE  Result Date: 10/29/2019    ECHOCARDIOGRAM REPORT   Patient Name:   ILIA SPROULL Date of Exam: 10/29/2019 Medical Rec #:  [id number]      Height:       61.0 in Accession #:    [id number]     Weight:       270.0 lb Date of Birth:  1945/08/04      BSA:  2.146 m Patient Age:    58 years       BP:           136/73 mmHg Patient Gender: F              HR:           135 bpm. Exam Location:  Inpatient Procedure: 2D Echo, Cardiac Doppler and Color Doppler Indications:    I48.0 Paroxysmal atrial fibrillation  History:        Patient has no prior history of Echocardiogram examinations.                 COVID-19 Positive.  Sonographer:    Jonelle Sidle Dance Referring Phys: [place]  1. Normal LV function; mild LAE.  2. Left ventricular ejection fraction, by estimation, is 55 to 60%. The left ventricle has normal function. The left ventricle has no regional wall motion abnormalities. Left ventricular diastolic function could not be evaluated.  3. Right ventricular systolic function is normal. The right ventricular size is normal. Tricuspid regurgitation signal is inadequate for assessing PA pressure.  4. Left atrial size was mildly dilated.  5. The mitral valve is normal in structure. Trivial mitral valve regurgitation. No evidence of mitral stenosis.  6. The aortic valve is tricuspid. Aortic valve regurgitation is not visualized. No aortic stenosis is present.  7. The inferior vena cava is normal in size with greater than 50% respiratory variability, suggesting right atrial pressure of 3 mmHg. FINDINGS  Left Ventricle: Left ventricular ejection fraction, by estimation, is 55 to 60%. The left ventricle has normal function. The left ventricle has no regional wall motion abnormalities. The left ventricular internal cavity size was normal in size. There is  no left ventricular hypertrophy. Left ventricular diastolic function could not be evaluated due to atrial fibrillation. Left  ventricular diastolic function could not be evaluated. Right Ventricle: The right ventricular size is normal. Right ventricular systolic function is normal. Tricuspid regurgitation signal is inadequate for assessing PA pressure. Left Atrium: Left atrial size was mildly dilated. Right Atrium: Right atrial size was normal in size. Pericardium: There is no evidence of pericardial effusion. Mitral Valve: The mitral valve is normal in structure. Normal mobility of the mitral valve leaflets. Trivial mitral valve regurgitation. No evidence of mitral valve stenosis. Tricuspid Valve: The tricuspid valve is normal in structure. Tricuspid valve regurgitation is mild . No evidence of tricuspid stenosis. Aortic Valve: The aortic valve is tricuspid. Aortic valve regurgitation is not visualized. No aortic stenosis is present. Pulmonic Valve: The pulmonic valve was not well visualized. Pulmonic valve regurgitation is trivial. No evidence of pulmonic stenosis. Aorta: The aortic root is normal in size and structure. Venous: The inferior vena cava is normal in size with greater than 50% respiratory variability, suggesting right atrial pressure of 3 mmHg.  Additional Comments: Normal LV function; mild LAE.  LEFT VENTRICLE PLAX 2D LVIDd:         4.00 cm LVIDs:         2.85 cm LV PW:         1.10 cm LV IVS:        1.13 cm LVOT diam:     1.85 cm LV SV:         40 LV SV Index:   19 LVOT Area:     2.69 cm  RIGHT VENTRICLE             IVC RV Basal diam:  2.82  cm     IVC diam: 1.67 cm RV S prime:     12.20 cm/s TAPSE (M-mode): 0.9 cm LEFT ATRIUM           Index       RIGHT ATRIUM           Index LA diam:      3.20 cm 1.49 cm/m  RA Area:     15.80 cm LA Vol (A2C): 74.0 ml 34.49 ml/m RA Volume:   39.80 ml  18.55 ml/m LA Vol (A4C): 61.6 ml 28.71 ml/m  AORTIC VALVE LVOT Vmax:   122.50 cm/s LVOT Vmean:  66.600 cm/s LVOT VTI:    0.150 m  AORTA Ao Root diam: 3.30 cm Ao Asc diam:  3.20 cm MITRAL VALVE MV Area (PHT): 3.62 cm    SHUNTS MV Decel  Time: 210 msec    Systemic VTI:  0.15 m MV E velocity: 53.35 cm/s  Systemic Diam: 1.85 cm Kirk Ruths MD Electronically signed by Kirk Ruths MD Signature Date/Time: 10/29/2019/2:08:19 PM    Final    VAS [place] LOWER EXTREMITY VENOUS (DVT)  Result Date: 11/03/2019  Lower Venous DVTStudy Indications: Swelling.  Risk Factors: COVID 19 positive. Limitations: Body habitus and poor ultrasound/tissue interface. Comparison Study: No prior studies. Performing Technologist: Oliver Hum RVT  Examination Guidelines: A complete evaluation includes B-mode imaging, spectral Doppler, color Doppler, and power Doppler as needed of all accessible portions of each vessel. Bilateral testing is considered an integral part of a complete examination. Limited examinations for reoccurring indications may be performed as noted. The reflux portion of the exam is performed with the patient in reverse Trendelenburg.  +---------+---------------+---------+-----------+----------+--------------+ RIGHT    CompressibilityPhasicitySpontaneityPropertiesThrombus Aging +---------+---------------+---------+-----------+----------+--------------+ CFV      Full           Yes      Yes                                 +---------+---------------+---------+-----------+----------+--------------+ SFJ      Full                                                        +---------+---------------+---------+-----------+----------+--------------+ FV Prox  Full                                                        +---------+---------------+---------+-----------+----------+--------------+ FV Mid   Full                                                        +---------+---------------+---------+-----------+----------+--------------+ FV DistalFull                                                        +---------+---------------+---------+-----------+----------+--------------+ PFV      Full                                                         +---------+---------------+---------+-----------+----------+--------------+  POP      Full           Yes      Yes                                 +---------+---------------+---------+-----------+----------+--------------+ PTV      Full                                                        +---------+---------------+---------+-----------+----------+--------------+ PERO     Full                                                        +---------+---------------+---------+-----------+----------+--------------+   +---------+---------------+---------+-----------+----------+--------------+ LEFT     CompressibilityPhasicitySpontaneityPropertiesThrombus Aging +---------+---------------+---------+-----------+----------+--------------+ CFV      Full           Yes      Yes                                 +---------+---------------+---------+-----------+----------+--------------+ SFJ      Full                                                        +---------+---------------+---------+-----------+----------+--------------+ FV Prox  Full                                                        +---------+---------------+---------+-----------+----------+--------------+ FV Mid   Full                                                        +---------+---------------+---------+-----------+----------+--------------+ FV DistalFull                                                        +---------+---------------+---------+-----------+----------+--------------+ PFV      Full                                                        +---------+---------------+---------+-----------+----------+--------------+ POP      Full           Yes      Yes                                 +---------+---------------+---------+-----------+----------+--------------+  PTV      Full                                                         +---------+---------------+---------+-----------+----------+--------------+ PERO     Full                                                        +---------+---------------+---------+-----------+----------+--------------+     Summary: RIGHT: - There is no evidence of deep vein thrombosis in the lower extremity. However, portions of this examination were limited- see technologist comments above.  - No cystic structure found in the popliteal fossa.  LEFT: - There is no evidence of deep vein thrombosis in the lower extremity. However, portions of this examination were limited- see technologist comments above.  - No cystic structure found in the popliteal fossa.  *See table(s) above for measurements and observations. Electronically signed by Monica Martinez MD on 11/03/2019 at 4:09:50 PM.    Final    [place] EKG SITE RITE  Result Date: 11/04/2019 If Site Rite image not attached, placement could not be confirmed due to current cardiac rhythm.   Time Spent in minutes  30   Lala Lund M.D on 11/04/2019 at 2:29 PM  To page go to [email or web address] - password [facility]

## 2019-11-04 NOTE — Progress Notes (Signed)
   HR control variable with afib - requiring diltiazem 60 q8 and amiodarone 200 mg BID. Hypotensive on Eliquis. Echo showed normal LV function. Digoxin given yesterday. Consider increase of dilt to 90 q8 as bp allows.  Pixie Casino, MD, [facility], [place] Director of the Advanced Lipid Disorders &  Cardiovascular Risk Reduction Clinic Diplomate of the American Board of Clinical Lipidology Attending Cardiologist  Direct Dial: [phone]  Fax: [phone]  Website:  [email or web address]

## 2019-11-04 NOTE — Plan of Care (Signed)
  Problem: Clinical Measurements: Goal: Respiratory complications will improve Outcome: Progressing   

## 2019-11-04 NOTE — Progress Notes (Signed)
Physical Therapy Treatment Patient Details Name: Julia Mcfarland MRN: [id number] DOB: 03-Dec-1945 Today's Date: 11/04/2019    History of Present Illness Pt is 74 yo female admitted on 10/28/19 with fatigue, dyspnea, palpitations.  She was found to have Afib with RVR and +COVID.  PMH includes - chronic venous insufficiency, DJD, Bil TKA, and morbid obesity.    PT Comments    Pt with limited tolerance for activity, but did have slight improvement since last visit.  Pt on 80% FiO2 heated high flow O2 with O2 sats stable during therapy.  Pt did have some confusion and required increased time and cues for safe transfer techniques.  Cont POC.    Follow Up Recommendations  SNF     Equipment Recommendations  Rolling walker with 5" wheels;Wheelchair (measurements PT);Wheelchair cushion (measurements PT);3in1 (PT)(to be further assessed next venue)    Recommendations for Other Services       Precautions / Restrictions Precautions Precautions: Fall;Other (comment) Precaution Comments: monitor oxygen saturation and BP, target HR 65-105    Mobility  Bed Mobility Overal bed mobility: Needs Assistance Bed Mobility: Sit to Supine;Supine to Sit     Supine to sit: Mod assist Sit to supine: Mod assist   General bed mobility comments: Mod A to elevate trunk and to get legs back in bed; increased time and cues for sequencing  Transfers Overall transfer level: Needs assistance Equipment used: Rolling walker (2 wheeled) Transfers: Sit to/from Stand Sit to Stand: Min assist         General transfer comment: min A to power up; cues for hand placement  Ambulation/Gait Ambulation/Gait assistance: Min assist Gait Distance (Feet): 3 Feet Assistive device: Rolling walker (2 wheeled) Gait Pattern/deviations: Step-to pattern;Shuffle Gait velocity: decreased   General Gait Details: side steps up HOB; fatigued easily; unsteady - cues for RW   Stairs             Wheelchair Mobility     Modified Rankin (Stroke Patients Only)       Balance Overall balance assessment: Needs assistance Sitting-balance support: Feet supported Sitting balance-Leahy Scale: Fair     Standing balance support: Bilateral upper extremity supported;During functional activity Standing balance-Leahy Scale: Fair                              Cognition Arousal/Alertness: Awake/alert Behavior During Therapy: Flat affect Overall Cognitive Status: No family/caregiver present to determine baseline cognitive functioning                                 General Comments: Pt required repetition of questions and increased time to respond.  Answers were not always clear/sensical and pt had to rethink and answer appropriatley.      Exercises General Exercises - Lower Extremity Ankle Circles/Pumps: AROM;Both;10 reps;Seated Long Arc Quad: AROM;Both;10 reps;Seated Hip Flexion/Marching: AROM;Both;10 reps;Seated    General Comments General comments (skin integrity, edema, etc.): Pt was on 25 L heated HFNC with FiO2 80%.  O2 remained 91% or > throughout therapy.  HR 90-110 bpm throughout therapy.  BP was 94/71 in supine, EOB initial reading 69/54 but pt moving arm, immediately retaken and was 107/57.  Pt denied any dizziness      Pertinent Vitals/Pain Pain Assessment: No/denies pain    Home Living  Prior Function            PT Goals (current goals can now be found in the care plan section) Progress towards PT goals: Progressing toward goals    Frequency    Min 2X/week      PT Plan Current plan remains appropriate    Co-evaluation              AM-PAC PT "6 Clicks" Mobility   Outcome Measure  Help needed turning from your back to your side while in a flat bed without using bedrails?: A Little Help needed moving from lying on your back to sitting on the side of a flat bed without using bedrails?: A Little Help needed moving to  and from a bed to a chair (including a wheelchair)?: A Lot Help needed standing up from a chair using your arms (e.g., wheelchair or bedside chair)?: A Little Help needed to walk in hospital room?: A Lot Help needed climbing 3-5 steps with a railing? : A Lot 6 Click Score: 15    End of Session Equipment Utilized During Treatment: Oxygen Activity Tolerance: Patient limited by fatigue Patient left: in bed;with call bell/phone within reach;with bed alarm set Nurse Communication: Mobility status PT Visit Diagnosis: Unsteadiness on feet (R26.81);Other abnormalities of gait and mobility (R26.89)     Time: [id number] PT Time Calculation (min) (ACUTE ONLY): 16 min  Charges:  $Therapeutic Activity: 8-22 mins                     Maggie Font, PT Acute Rehab Services Pager [phone] [facility] [phone] [facility] [phone]    Karlton Lemon 11/04/2019, 5:29 PM

## 2019-11-04 NOTE — Progress Notes (Signed)
Peripherally Inserted Central Catheter Placement  The IV Nurse has discussed with the patient and/or persons authorized to consent for the patient, the purpose of this procedure and the potential benefits and risks involved with this procedure.  The benefits include less needle sticks, lab draws from the catheter, and the patient may be discharged home with the catheter. Risks include, but not limited to, infection, bleeding, blood clot (thrombus formation), and puncture of an artery; nerve damage and irregular heartbeat and possibility to perform a PICC exchange if needed/ordered by physician.  Alternatives to this procedure were also discussed.  Bard Power PICC patient education guide, fact sheet on infection prevention and patient information card has been provided to patient /or left at bedside.    PICC Placement Documentation  PICC Single Lumen [id number] PICC Right Basilic 40 cm 0 cm (Active)  Indication for Insertion or Continuance of Line Poor Vasculature-patient has had multiple peripheral attempts or PIVs lasting less than 24 hours 11/04/19 1700  Exposed Catheter (cm) 0 cm 11/04/19 1700  Site Assessment Clean;Dry;Intact 11/04/19 1700  Line Status Flushed;Saline locked;Blood return noted 11/04/19 1700  Dressing Type Transparent;Securing device 11/04/19 1700  Dressing Status Clean;Dry;Intact;Antimicrobial disc in place 11/04/19 1700  Dressing Change Due 11/11/19 11/04/19 [place], [place] 11/04/2019, 5:55 PM

## 2019-11-05 ENCOUNTER — Inpatient Hospital Stay (HOSPITAL_COMMUNITY): Payer: Medicare HMO

## 2019-11-05 LAB — COMPREHENSIVE METABOLIC PANEL
ALT: 23 U/L (ref 0–44)
AST: 24 U/L (ref 15–41)
Albumin: 2.4 g/dL — ABNORMAL LOW (ref 3.5–5.0)
Alkaline Phosphatase: 56 U/L (ref 38–126)
Anion gap: 9 (ref 5–15)
BUN: 24 mg/dL — ABNORMAL HIGH (ref 8–23)
CO2: 32 mmol/L (ref 22–32)
Calcium: 7.9 mg/dL — ABNORMAL LOW (ref 8.9–10.3)
Chloride: 98 mmol/L (ref 98–111)
Creatinine, Ser: 1.17 mg/dL — ABNORMAL HIGH (ref 0.44–1.00)
GFR calc Af Amer: 54 mL/min — ABNORMAL LOW (ref >60)
GFR calc non Af Amer: 47 mL/min — ABNORMAL LOW (ref >60)
Glucose, Bld: 127 mg/dL — ABNORMAL HIGH (ref 70–99)
Potassium: 2.9 mmol/L — ABNORMAL LOW (ref 3.5–5.1)
Sodium: 139 mmol/L (ref 135–145)
Total Bilirubin: 0.8 mg/dL (ref 0.3–1.2)
Total Protein: 4.9 g/dL — ABNORMAL LOW (ref 6.5–8.1)

## 2019-11-05 LAB — GLUCOSE, CAPILLARY
Glucose-Capillary: 118 mg/dL — ABNORMAL HIGH (ref 70–99)
Glucose-Capillary: 119 mg/dL — ABNORMAL HIGH (ref 70–99)
Glucose-Capillary: 163 mg/dL — ABNORMAL HIGH (ref 70–99)
Glucose-Capillary: 178 mg/dL — ABNORMAL HIGH (ref 70–99)

## 2019-11-05 LAB — CBC
HCT: 45.6 % (ref 36.0–46.0)
HCT: 46.3 % — ABNORMAL HIGH (ref 36.0–46.0)
Hemoglobin: 14.6 g/dL (ref 12.0–15.0)
Hemoglobin: 15 g/dL (ref 12.0–15.0)
MCH: 30.4 pg (ref 26.0–34.0)
MCH: 30.7 pg (ref 26.0–34.0)
MCHC: 32 g/dL (ref 30.0–36.0)
MCHC: 32.4 g/dL (ref 30.0–36.0)
MCV: 94.8 fL (ref 80.0–100.0)
MCV: 94.9 fL (ref 80.0–100.0)
Platelets: 396 10*3/uL (ref 150–400)
Platelets: 408 10*3/uL — ABNORMAL HIGH (ref 150–400)
RBC: 4.81 MIL/uL (ref 3.87–5.11)
RBC: 4.88 MIL/uL (ref 3.87–5.11)
RDW: 13 % (ref 11.5–15.5)
RDW: 13 % (ref 11.5–15.5)
WBC: 6.3 10*3/uL (ref 4.0–10.5)
WBC: 7.6 10*3/uL (ref 4.0–10.5)
nRBC: 0.5 % — ABNORMAL HIGH (ref 0.0–0.2)
nRBC: 0.3 % — ABNORMAL HIGH (ref 0.0–0.2)

## 2019-11-05 LAB — BRAIN NATRIURETIC PEPTIDE: B Natriuretic Peptide: 74.3 pg/mL (ref 0.0–100.0)

## 2019-11-05 LAB — C-REACTIVE PROTEIN: CRP: 0.8 mg/dL (ref <1.0)

## 2019-11-05 LAB — D-DIMER, QUANTITATIVE: D-Dimer, Quant: 7.04 ug/mL-FEU — ABNORMAL HIGH (ref 0.00–0.50)

## 2019-11-05 LAB — PROCALCITONIN: Procalcitonin: <0.1 ng/mL

## 2019-11-05 LAB — MAGNESIUM: Magnesium: 2.1 mg/dL (ref 1.7–2.4)

## 2019-11-05 MED ORDER — LACTATED RINGERS IV SOLN: INTRAVENOUS | Status: DC

## 2019-11-05 MED ORDER — ENOXAPARIN SODIUM 120 MG/0.8ML ~~LOC~~ SOLN
120.0000 mg | Freq: Two times a day (BID) | SUBCUTANEOUS | Status: DC
  Administered 2019-11-05 – 2019-11-08 (×7): 120 mg via SUBCUTANEOUS
  Filled 2019-11-05 (×8): qty 0.8

## 2019-11-05 MED ORDER — POTASSIUM CHLORIDE 10 MEQ/100ML IV SOLN
10.0000 meq | INTRAVENOUS | Status: AC
  Administered 2019-11-05 (×4): 10 meq via INTRAVENOUS
  Filled 2019-11-05 (×4): qty 100

## 2019-11-05 MED ORDER — SODIUM CHLORIDE 0.9 % IV BOLUS
500.0000 mL | Freq: Once | INTRAVENOUS | Status: AC
  Administered 2019-11-05: 500 mL via INTRAVENOUS

## 2019-11-05 MED ORDER — LACTATED RINGERS IV SOLN: INTRAVENOUS | Status: AC

## 2019-11-05 MED ORDER — POTASSIUM CHLORIDE CRYS ER 20 MEQ PO TBCR
40.0000 meq | EXTENDED_RELEASE_TABLET | Freq: Once | ORAL | Status: AC
  Administered 2019-11-05: 40 meq via ORAL
  Filled 2019-11-05: qty 2

## 2019-11-05 MED ORDER — MIDODRINE HCL 5 MG PO TABS
10.0000 mg | ORAL_TABLET | Freq: Three times a day (TID) | ORAL | Status: DC
  Administered 2019-11-05 – 2019-11-07 (×6): 10 mg via ORAL
  Filled 2019-11-05 (×6): qty 2

## 2019-11-05 NOTE — Progress Notes (Signed)
   Afib with improved rate control after increase in cardizem - BP soft, will not allow further increases at this time. No changes recommended. Hemoglobin stable on Eliquis. Aggressively replete potassium (2.9 today) per hospitalist service. Mg 2.1.  Pixie Casino, MD, [facility], [place] Director of the Advanced Lipid Disorders &  Cardiovascular Risk Reduction Clinic Diplomate of the American Board of Clinical Lipidology Attending Cardiologist  Direct Dial: [phone]  Fax: [phone]  Website:  [email or web address]

## 2019-11-05 NOTE — Progress Notes (Signed)
ANTICOAGULATION CONSULT NOTE - Follow Up Consult  Pharmacy Consult for Eliquis > Lovenox Indication: atrial fibrillation  No Known Allergies  Patient Measurements: Height: 5\' 1"  (154.9 cm) Weight: 123.8 kg (272 lb 14.9 oz) IBW/kg (Calculated) : 47.8  Vital Signs: Temp: 98.5 F (36.9 C) (05/04 0738) Temp Source: Oral (05/04 0738) BP: 91/62 (05/04 0738) Pulse Rate: 96 (05/04 0738)  Labs: Recent Labs    11/03/19 0615 11/03/19 0615 11/03/19 1305 11/04/19 0742 11/04/19 0742 11/04/19 2016 11/04/19 2317 11/05/19 0517  HGB 16.2*   < >  --  16.7*   < >  --  14.6 15.0  HCT 50.0*   < >  --  52.1*  --   --  45.6 46.3*  PLT 404*   < >  --  375  --   --  396 408*  HEPRLOWMOCWT  --   --  1.85  --   --   --   --   --   CREATININE 1.24*  --   --   --   --  1.14*  --  1.17*   < > = values in this interval not displayed.    Estimated Creatinine Clearance: 53.7 mL/min (A) (by C-G formula based on SCr of 1.17 mg/dL (H)).  Assessment:   74 yr old female transitioned from IV Heparin to Eliquis on 4/29. D-dimer continued to climb while on Eliquis. MD consulted Pharmacy to change to Full Dose Lovenox therapy.   Last Eliquis dose given 4/30 at 21:06PM.  CBC has been stable  SCr is stable at 1.17 with CrCl ~54 mL/min.  Ddimer trending down (16.96>15.08>7.04)  No bleeding observed.   Goal of Therapy:  Anti-Xa level 0.6-1 units/ml 4hrs after LMWH dose given Monitor platelets by anticoagulation protocol: Yes   Plan:  Increase Lovenox 120 mg/kg SQ every 12 hours for increased weight.  Per MD note, plan is to transition back to apixaban once Ddimer trending down. Will f/u transition.  Monitor CBC and for any signs of bleeding.   Marielouise Amey A. Levada Dy, PharmD, BCPS, FNKF Clinical Pharmacist [place] Please utilize Amion for appropriate phone number to reach the unit pharmacist ([place])   11/05/2019,8:16 AM

## 2019-11-05 NOTE — Progress Notes (Signed)
PROGRESS NOTE                                                                                                                                                                                                             [name], is a 74 y.o. female, DOB - [birth year], [id number]  Admit date - 10/28/2019   Admitting Physician Sanda Klein, MD  Outpatient Primary MD for the patient is [name], NP  LOS - 8  Chief Complaint  Patient presents with   Tachycardia       Brief Narrative  - Julia Mcfarland is a 74 y.o. female with medical history significant of chronic venous insufficiency, arthritis, and morbid obesity presented complaints of palpitations, generalized malaise, and shortness of breath over the last 3 days.  Associated symptoms included chills, generalized weakness, and a nonproductive cough.  Upon admission she was found to be in A. fib with RVR with heart rates into the 170s.     She was admitted by cardiology team on 26 April subsequently found to have COVID-19 pneumonia and hospitalist team was consulted.  She had by that time developed severe parenchymal injury with severe hypoxia, briefly was in ICU for heated high flow now back to progressive care unit with heated high flow, current issues are gradually improving hypoxic respiratory failure, continued A. fib RVR, dehydration with hypotension.   Subjective:   Patient in bed, appears comfortable, denies any headache, no fever, no chest pain or pressure, improved shortness of breath , no abdominal pain. No focal weakness.    Assessment  & Plan :     1.  Acute hypoxic respiratory failure due to [id number] pneumonia complicated by pneumomediastinum.  She was initially treated for the first 2 to 3 days of hospitalization with IV steroids and remdesivir, however she continued to decline, she was transferred to my care on 10/30/2018 when she promptly received Actemra however by that  time she had already progressed to heated high flow.  She seems to have overall stabilized with stabilization of inflammatory markers.  She has finished her Covid specific treatment for now, await gradual healing of her extensive parenchymal lung injury.  She was up to 40 L heated high flow currently she is on 30 L heated high flow with FiO2 of 60%.  She has developed a small pneumomediastinum on 10/31/2019 which will be closely monitored, No signs of bacterial infection or esophageal rupture.  No chest or abdominal  pain.  Gradually improving continue to monitor.  Overall extremely tenuous.  Monitor in ICU for now.  SpO2: 91 % O2 Flow Rate (L/min): 30 L/min FiO2 (%): 60 %(changed per MD order)    Recent Labs  Lab 11/01/19 0500 11/02/19 0500 11/03/19 0615 11/04/19 0500 11/04/19 2016 11/05/19 0517  NA 144 145 144  --  141 139  K 4.1 3.4* 3.3*  --  3.3* 2.9*  CL 105 102 99  --  98 98  CO2 27 29 31   --  32 32  GLUCOSE 229* 95 99  --  163* 127*  BUN 27* 27* 30*  --  24* 24*  CREATININE 1.19* 1.18* 1.24*  --  1.14* 1.17*  CALCIUM 8.5* 8.8* 8.5*  --  7.9* 7.9*  AST 46* 30 24  --  25 24  ALT 34 32 26  --  23 23  ALKPHOS 60 63 62  --  58 56  BILITOT 1.2 1.0 1.1  --  1.0 0.8  ALBUMIN 2.6* 2.6* 2.5*  --  2.4* 2.4*  MG 2.2 2.2 2.3  --  2.2 2.1  CRP 4.7* 3.3* 1.9*  --  0.9 0.8  DDIMER 13.89* 16.96* 15.08*  --  7.92* 7.04*  PROCALCITON 0.15 <0.10 <0.10  --  <0.10 <0.10  BNP 269.1* 181.6* 82.2 74.4  --  74.3    Recent Labs  Lab 11/01/19 0500 11/02/19 0500 11/03/19 0615 11/04/19 0500 11/04/19 2016 11/05/19 0517  CRP 4.7* 3.3* 1.9*  --  0.9 0.8  DDIMER 13.89* 16.96* 15.08*  --  7.92* 7.04*  BNP 269.1* 181.6* 82.2 74.4  --  74.3  PROCALCITON 0.15 <0.10 <0.10  --  <0.10 <0.10      Lab Results  Component Value Date   TSH 0.725 10/28/2019    2.  Paroxysmal A. fib RVR.  Mali vas 2 score of greater than 3.  Cardiology on board, stable TSH and echocardiogram, initially required IV  amiodarone and digoxin, currently transition to oral amiodarone, oral Cardizem dose increased on 11/04/2019 for better control, also gentle hydration with fluids, continue anticoagulation with Loevnox.  3.  Obesity with BMI of 51.  Follow with PCP for weight loss.  4.  AKI.  Clinically now appears dehydrated, gentle IV fluids and monitor.  5.  Metabolic encephalopathy.  Was due to hypoxia completely resolved after heated high flow, p.m. Seroquel.    6. 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  7.  Hypokalemia.  Replaced IV + PO.    8.  Rising D-dimer despite being on Eliquis.  This is likely due to inflammation from COVID-19, leg ultrasound is negative, for now full dose Lovenox till D-dimer starts trending down thereafter switch back to Eliquis.    9.  Hypotension.  Due to aggressive diuresis in ICU.  Hydrate with IV fluids and monitor closely.    10. DM type II.  Lantus plus sliding scale monitor and adjust.    Lab Results  Component Value Date   HGBA1C 6.3 (H) 10/28/2019   CBG (last 3)  Recent Labs    11/04/19 1645 11/04/19 2020 11/05/19 0728  GLUCAP 139* 145* 118*      Family Communication  : Husband [name] [phone] on 10/30/2019, 10/31/19, 11/01/19 husband clearly explained that Julia Mcfarland's situation remains quite critical.  Updated  11/03/2019, message left on 11/04/2019 at 2:33 PM, he is now Hospitalized at [facility].  Code Status :  Full  Disposition Plan  : Stay inpatient in the hospital for treatment of severe acute hypoxic respiratory failure caused by COVID-19 pneumonia, currently on heated high flow oxygen along with IV fluids, A. fib RVR.  Once medically stable she will have to be reassessed where she is appropriate for discharge may require SNF.  Consults  : Cards  Procedures  :    Leg [place]  - No DVT  TTE -  1. Normal LV function; mild LAE.  2. Left ventricular ejection fraction, by estimation, is 55 to 60%. The left ventricle has normal function. The left ventricle has no regional wall motion abnormalities. Left ventricular diastolic function could not be evaluated.  3. Right ventricular systolic function is normal. The right ventricular size is normal. Tricuspid regurgitation signal is inadequate for assessing PA pressure.  4. Left atrial size was mildly dilated.  5. The mitral valve is normal in structure. Trivial mitral valve regurgitation. No evidence of mitral stenosis.  6. The aortic valve is tricuspid. Aortic valve regurgitation is not visualized. No aortic stenosis is present.  7. The inferior vena cava is normal in size with greater than 50% respiratory variability, suggesting right atrial pressure of 3 mmHg.  DVT Prophylaxis  :  Eliquis  Lab Results  Component Value Date   PLT 408 (H) 11/05/2019    Diet :  Diet Order            DIET SOFT Room service appropriate? Yes; Fluid consistency: Thin  Diet effective now               Inpatient Medications Scheduled Meds:  amiodarone  200 mg Oral BID   Chlorhexidine Gluconate Cloth  6 each Topical Daily   diltiazem  90 mg Oral Q8H   enoxaparin (LOVENOX) injection  120 mg Subcutaneous Q12H   insulin aspart  0-20 Units Subcutaneous TID WC   insulin detemir  15 Units Subcutaneous Daily   pantoprazole  40 mg Oral Daily   QUEtiapine  25 mg Oral QHS   Continuous Infusions:  lactated ringers     potassium chloride 10 mEq (11/05/19 0750)   sodium chloride     PRN Meds:.acetaminophen, albuterol, metoprolol tartrate, ondansetron (ZOFRAN) IV, sodium chloride flush  Antibiotics  :   Anti-infectives (From admission, onward)   Start     Dose/Rate Route Frequency Ordered Stop   10/30/19 1000  remdesivir 100 mg in sodium chloride 0.9 % 100 mL IVPB     100 mg 200 mL/hr over 30 Minutes Intravenous  Daily 10/29/19 0914 11/02/19 0956   10/29/19 1000  remdesivir 200 mg in sodium chloride 0.9% 250 mL IVPB     200 mg 580 mL/hr over 30 Minutes Intravenous Once 10/29/19 0914 10/29/19 1236         Objective:   Vitals:   11/05/19 0242 11/05/19 0334 11/05/19 0728 11/05/19 0738  BP:  92/73  91/62  Pulse: 98 79  96  Resp: (!) 24 (!) 21  (!) 23  Temp:  97.7 F (36.5 C)  98.5 F (36.9 C)  TempSrc:  Oral  Oral  SpO2: 94% 92% 92% 91%  Weight:      Height:        SpO2: 91 % O2 Flow Rate (L/min): 30 L/min FiO2 (%): 60 %(changed per MD order)  Wt Readings from Last 3 Encounters:  11/05/19 123.8 kg  09/19/19 132 kg  02/18/19 127.9 kg     Intake/Output Summary (Last 24 hours) at 11/05/2019 0854 Last data filed at 11/05/2019 0339 Gross per 24 hour  Intake 740 ml  Output 2100 ml  Net -1360 ml     Physical Exam  Awake Alert, No new F.N deficits, Normal affect [place].AT,PERRAL Supple Neck,No JVD, No cervical lymphadenopathy appriciated.  Symmetrical Chest wall movement, Good air movement bilaterally, no rales RRR,No Gallops, Rubs or new Murmurs, No Parasternal Heave +ve B.Sounds, Abd Soft, No tenderness, No organomegaly appriciated, No rebound - guarding or rigidity. No Cyanosis,  no pitting edema   Data Review:    Recent Labs  Lab 10/31/19 0503 10/31/19 0503 11/01/19 0044 11/03/19 0615 11/04/19 0742 11/04/19 2317 11/05/19 0517  WBC 8.1  --   --  12.1* 8.6 6.3 7.6  HGB 15.4*   < > 16.0* 16.2* 16.7* 14.6 15.0  HCT 48.0*   < > 47.0* 50.0* 52.1* 45.6 46.3*  PLT 378  --   --  404* 375 396 408*  MCV 93.8  --   --  92.4 93.5 94.8 94.9  MCH 30.1  --   --  29.9 30.0 30.4 30.7  MCHC 32.1  --   --  32.4 32.1 32.0 32.4  RDW 12.9  --   --  12.8 12.9 13.0 13.0   < > = values in this interval not displayed.    Recent Labs  Lab 11/01/19 0500 11/02/19 0500 11/03/19 0615 11/04/19 0500 11/04/19 2016 11/05/19 0517  NA 144 145 144  --  141 139  K 4.1 3.4* 3.3*  --  3.3* 2.9*    CL 105 102 99  --  98 98  CO2 27 29 31   --  32 32  GLUCOSE 229* 95 99  --  163* 127*  BUN 27* 27* 30*  --  24* 24*  CREATININE 1.19* 1.18* 1.24*  --  1.14* 1.17*  CALCIUM 8.5* 8.8* 8.5*  --  7.9* 7.9*  AST 46* 30 24  --  25 24  ALT 34 32 26  --  23 23  ALKPHOS 60 63 62  --  58 56  BILITOT 1.2 1.0 1.1  --  1.0 0.8  ALBUMIN 2.6* 2.6* 2.5*  --  2.4* 2.4*  MG 2.2 2.2 2.3  --  2.2 2.1  CRP 4.7* 3.3* 1.9*  --  0.9 0.8  DDIMER 13.89* 16.96* 15.08*  --  7.92* 7.04*  PROCALCITON 0.15 <0.10 <0.10  --  <0.10 <0.10  BNP 269.1* 181.6* 82.2 74.4  --  74.3    Recent Labs  Lab 11/01/19 0500 11/02/19 0500 11/03/19 0615 11/04/19 0500 11/04/19 2016 11/05/19 0517  CRP 4.7* 3.3* 1.9*  --  0.9 0.8  DDIMER 13.89* 16.96* 15.08*  --  7.92* 7.04*  BNP 269.1* 181.6* 82.2 74.4  --  74.3  PROCALCITON 0.15 <0.10 <0.10  --  <0.10 <0.10    ------------------------------------------------------------------------------------------------------------------ No results for input(s): CHOL, HDL, LDLCALC, TRIG, CHOLHDL, LDLDIRECT in the last 72 hours.  Lab Results  Component Value Date   HGBA1C 6.3 (H) 10/28/2019   ------------------------------------------------------------------------------------------------------------------ No results for input(s): TSH, T4TOTAL, T3FREE, THYROIDAB in the last 72 hours.  Invalid input(s): FREET3 ------------------------------------------------------------------------------------------------------------------ No results for input(s): VITAMINB12, FOLATE, FERRITIN, TIBC, IRON, RETICCTPCT in the last 72 hours.  Coagulation profile No results for input(s): INR, PROTIME in the last 168 hours.  Recent Labs    11/04/19 2016 11/05/19 0517  DDIMER 7.92* 7.04*  Cardiac Enzymes No results for input(s): CKMB, TROPONINI, MYOGLOBIN in the last 168 hours.  Invalid input(s):  CK ------------------------------------------------------------------------------------------------------------------    Component Value Date/Time   BNP 74.3 11/05/2019 0517    Micro Results Recent Results (from the past 240 hour(s))  SARS CORONAVIRUS 2 (TAT 6-24 HRS) Nasopharyngeal Nasopharyngeal Swab     Status: Abnormal   Collection Time: 10/28/19  4:18 PM   Specimen: Nasopharyngeal Swab  Result Value Ref Range Status   SARS Coronavirus 2 POSITIVE (A) NEGATIVE Final    Comment: RESULT CALLED TO, READ BACK BY AND VERIFIED WITH: M. RUGGIERO,RN 0020 10/29/2019 T. TYSOR (NOTE) SARS-CoV-2 target nucleic acids are DETECTED. The SARS-CoV-2 RNA is generally detectable in upper and lower respiratory specimens during the acute phase of infection. Positive results are indicative of the presence of SARS-CoV-2 RNA. Clinical correlation with patient history and other diagnostic information is  necessary to determine patient infection status. Positive results do not rule out bacterial infection or co-infection with other viruses.  The expected result is Negative. Fact Sheet for Patients: SugarRoll.be Fact Sheet for Healthcare Providers: https://www.woods-mathews.com/ This test is not yet approved or cleared by the [place] FDA and  has been authorized for detection and/or diagnosis of SARS-CoV-2 by FDA under an Emergency Use Authorization (EUA). This EUA will remain  in effect (meaning this test can be used) for  the duration of the COVID-19 declaration under Section 564(b)(1) of the Act, 21 U.S.C. section 360bbb-3(b)(1), unless the authorization is terminated or revoked sooner. Performed at [facility], [place] [address], [place], [place] [address]   Culture, blood (Routine X 2) w Reflex to ID Panel     Status: None (Preliminary result)   Collection Time: 11/01/19 12:17 AM   Specimen: BLOOD LEFT HAND  Result Value Ref Range Status    Specimen Description BLOOD LEFT HAND  Final   Special Requests   Final    BOTTLES DRAWN AEROBIC AND ANAEROBIC Blood Culture adequate volume   Culture   Final    NO GROWTH 4 DAYS Performed at [facility], [place] [address], [place], [place] [address]    Report Status PENDING  Incomplete  Culture, blood (Routine X 2) w Reflex to ID Panel     Status: None (Preliminary result)   Collection Time: 11/01/19  1:02 AM   Specimen: BLOOD  Result Value Ref Range Status   Specimen Description BLOOD SITE NOT SPECIFIED  Final   Special Requests   Final    BOTTLES DRAWN AEROBIC AND ANAEROBIC Blood Culture adequate volume   Culture   Final    NO GROWTH 4 DAYS Performed at [facility], [address], [place],  [address]    Report Status PENDING  Incomplete    Radiology Reports DG Chest Port 1 View  Result Date: 11/04/2019 CLINICAL DATA:  PICC line placement EXAM: PORTABLE CHEST 1 VIEW COMPARISON:  11/04/2019 FINDINGS: Right PICC line is been placed. The tip is at the cavoatrial junction. Stable diffuse bilateral airspace disease. No visible effusion or pneumothorax. Subcutaneous emphysema again noted in the right neck base, stable. No acute bony abnormality. IMPRESSION: Right PICC line tip at the cavoatrial junction. Stable bilateral airspace disease. Electronically Signed   By: Rolm Baptise M.D.   On: 11/04/2019 18:46   DG Chest Port 1 View  Result Date: 11/04/2019 CLINICAL DATA:  Short of breath. EXAM: PORTABLE CHEST 1 VIEW COMPARISON:  11/02/2019 FINDINGS: Normal heart size. Extensive, bilateral pulmonary  opacities are identified which appear unchanged from previous exam. No pneumothorax. Subcutaneous emphysema is identified within the right neck. Etiology indeterminate. IMPRESSION: 1. No change in aeration a lungs compared with previous exam. 2. Right neck subcu emphysema noted. Electronically Signed   By: Kerby Moors M.D.   On: 11/04/2019 09:33   DG Chest Port 1 View  Result  Date: 11/02/2019 CLINICAL DATA:  Shortness of breath, COVID positive EXAM: PORTABLE CHEST 1 VIEW COMPARISON:  11/01/2019 FINDINGS: Diffuse bilateral pulmonary opacities. Worsening left lung aeration. No pneumothorax. Pneumomediastinum is similar. Stable cardiomediastinal contours. No significant pleural effusion. IMPRESSION: Bilateral pneumonia with worsening lung aeration on the left. Similar pneumomediastinum. Electronically Signed   By: Macy Mis M.D.   On: 11/02/2019 07:04   DG Chest Port 1 View  Result Date: 11/01/2019 CLINICAL DATA:  Acute respiratory failure EXAM: PORTABLE CHEST 1 VIEW COMPARISON:  Yesterday FINDINGS: Pneumomediastinum is newly seen along both mediastinal contours. No visible pneumothorax. Extensive bilateral pneumonia. Cardiomegaly and aortic tortuosity. These results will be called to the ordering clinician or representative by the Radiologist Assistant, and communication documented in the PACS or [employer]. IMPRESSION: 1. New pneumomediastinum. 2. Stable bilateral pneumonia.  No visible pneumothorax. Electronically Signed   By: Monte Fantasia M.D.   On: 11/01/2019 07:21   DG CHEST PORT 1 VIEW  Result Date: 10/31/2019 CLINICAL DATA:  Tachypnea EXAM: PORTABLE CHEST 1 VIEW COMPARISON:  10/31/2019, 10/30/2019, 10/28/2019 FINDINGS: Rotated patient. Fairly extensive bilateral ground-glass opacities and consolidations probably without significant change allowing for rotated patient. Enlarged cardiomediastinal silhouette also exaggerated by rotation. No pleural effusion or pneumothorax. IMPRESSION: Likely no significant interval change in fairly extensive bilateral airspace disease and consolidations allowing for patient rotation Electronically Signed   By: Donavan Foil M.D.   On: 10/31/2019 19:56   DG Chest Port 1 View  Result Date: 10/31/2019 CLINICAL DATA:  Shortness of breath. COVID-19 viral pneumonia. EXAM: PORTABLE CHEST 1 VIEW COMPARISON:  02/21/2020 FINDINGS: Heart  size remains within normal limits. Bilateral diffuse heterogeneous airspace opacity shows no significant change. No evidence of pneumothorax or pleural effusion. IMPRESSION: No significant change in diffuse heterogeneous airspace disease. Electronically Signed   By: Marlaine Hind M.D.   On: 10/31/2019 08:16   DG Chest Port 1 View  Result Date: 10/30/2019 CLINICAL DATA:  COVID positive. EXAM: PORTABLE CHEST 1 VIEW COMPARISON:  Chest x-ray dated October 28, 2019. FINDINGS: Stable cardiomediastinal silhouette. Normal pulmonary vascularity. Low lung volumes. Patchy interstitial and airspace opacities throughout both lungs, mildly worsened at the left lung base. No pleural effusion or pneumothorax. No acute osseous abnormality. IMPRESSION: 1. Multifocal pneumonia, mildly worsened at the left lung base. Electronically Signed   By: Titus Dubin M.D.   On: 10/30/2019 08:26   DG Chest Portable 1 View  Result Date: 10/28/2019 CLINICAL DATA:  Dyspnea with exertion. EXAM: PORTABLE CHEST 1 VIEW COMPARISON:  February 28, 2017. FINDINGS: Stable cardiomediastinal silhouette. Central pulmonary vascular congestion is noted. Bilateral lung opacities are noted which may represent edema or possibly multifocal pneumonia. Atherosclerosis of thoracic aorta is noted. No pneumothorax or pleural effusion is noted. Bony thorax is unremarkable. IMPRESSION: Aortic atherosclerosis. Central pulmonary vascular congestion is noted. Bilateral lung opacities are noted which may represent edema or possibly multifocal pneumonia. Aortic Atherosclerosis (ICD10-I70.0). Electronically Signed   By: Marijo Conception M.D.   On: 10/28/2019 15:42   ECHOCARDIOGRAM COMPLETE  Result Date: 10/29/2019    ECHOCARDIOGRAM REPORT   Patient Name:   Julia Mcfarland  Huang Date of Exam: 10/29/2019 Medical Rec #:  [id number]      Height:       61.0 in Accession #:    [id number]     Weight:       270.0 lb Date of Birth:  30-Aug-1945      BSA:          2.146 m Patient Age:    26  years       BP:           136/73 mmHg Patient Gender: F              HR:           135 bpm. Exam Location:  Inpatient Procedure: 2D Echo, Cardiac Doppler and Color Doppler Indications:    I48.0 Paroxysmal atrial fibrillation  History:        Patient has no prior history of Echocardiogram examinations.                 COVID-19 Positive.  Sonographer:    Jonelle Sidle Dance Referring Phys: [place]  1. Normal LV function; mild LAE.  2. Left ventricular ejection fraction, by estimation, is 55 to 60%. The left ventricle has normal function. The left ventricle has no regional wall motion abnormalities. Left ventricular diastolic function could not be evaluated.  3. Right ventricular systolic function is normal. The right ventricular size is normal. Tricuspid regurgitation signal is inadequate for assessing PA pressure.  4. Left atrial size was mildly dilated.  5. The mitral valve is normal in structure. Trivial mitral valve regurgitation. No evidence of mitral stenosis.  6. The aortic valve is tricuspid. Aortic valve regurgitation is not visualized. No aortic stenosis is present.  7. The inferior vena cava is normal in size with greater than 50% respiratory variability, suggesting right atrial pressure of 3 mmHg. FINDINGS  Left Ventricle: Left ventricular ejection fraction, by estimation, is 55 to 60%. The left ventricle has normal function. The left ventricle has no regional wall motion abnormalities. The left ventricular internal cavity size was normal in size. There is  no left ventricular hypertrophy. Left ventricular diastolic function could not be evaluated due to atrial fibrillation. Left ventricular diastolic function could not be evaluated. Right Ventricle: The right ventricular size is normal. Right ventricular systolic function is normal. Tricuspid regurgitation signal is inadequate for assessing PA pressure. Left Atrium: Left atrial size was mildly dilated. Right Atrium: Right  atrial size was normal in size. Pericardium: There is no evidence of pericardial effusion. Mitral Valve: The mitral valve is normal in structure. Normal mobility of the mitral valve leaflets. Trivial mitral valve regurgitation. No evidence of mitral valve stenosis. Tricuspid Valve: The tricuspid valve is normal in structure. Tricuspid valve regurgitation is mild . No evidence of tricuspid stenosis. Aortic Valve: The aortic valve is tricuspid. Aortic valve regurgitation is not visualized. No aortic stenosis is present. Pulmonic Valve: The pulmonic valve was not well visualized. Pulmonic valve regurgitation is trivial. No evidence of pulmonic stenosis. Aorta: The aortic root is normal in size and structure. Venous: The inferior vena cava is normal in size with greater than 50% respiratory variability, suggesting right atrial pressure of 3 mmHg.  Additional Comments: Normal LV function; mild LAE.  LEFT VENTRICLE PLAX 2D LVIDd:         4.00 cm LVIDs:         2.85 cm LV PW:         1.10 cm LV  IVS:        1.13 cm LVOT diam:     1.85 cm LV SV:         40 LV SV Index:   19 LVOT Area:     2.69 cm  RIGHT VENTRICLE             IVC RV Basal diam:  2.82 cm     IVC diam: 1.67 cm RV S prime:     12.20 cm/s TAPSE (M-mode): 0.9 cm LEFT ATRIUM           Index       RIGHT ATRIUM           Index LA diam:      3.20 cm 1.49 cm/m  RA Area:     15.80 cm LA Vol (A2C): 74.0 ml 34.49 ml/m RA Volume:   39.80 ml  18.55 ml/m LA Vol (A4C): 61.6 ml 28.71 ml/m  AORTIC VALVE LVOT Vmax:   122.50 cm/s LVOT Vmean:  66.600 cm/s LVOT VTI:    0.150 m  AORTA Ao Root diam: 3.30 cm Ao Asc diam:  3.20 cm MITRAL VALVE MV Area (PHT): 3.62 cm    SHUNTS MV Decel Time: 210 msec    Systemic VTI:  0.15 m MV E velocity: 53.35 cm/s  Systemic Diam: 1.85 cm Kirk Ruths MD Electronically signed by Kirk Ruths MD Signature Date/Time: 10/29/2019/2:08:19 PM    Final    VAS [place] LOWER EXTREMITY VENOUS (DVT)  Result Date: 11/03/2019  Lower Venous DVTStudy  Indications: Swelling.  Risk Factors: COVID 19 positive. Limitations: Body habitus and poor ultrasound/tissue interface. Comparison Study: No prior studies. Performing Technologist: Oliver Hum RVT  Examination Guidelines: A complete evaluation includes B-mode imaging, spectral Doppler, color Doppler, and power Doppler as needed of all accessible portions of each vessel. Bilateral testing is considered an integral part of a complete examination. Limited examinations for reoccurring indications may be performed as noted. The reflux portion of the exam is performed with the patient in reverse Trendelenburg.  +---------+---------------+---------+-----------+----------+--------------+  RIGHT     Compressibility Phasicity Spontaneity Properties Thrombus Aging  +---------+---------------+---------+-----------+----------+--------------+  CFV       Full            Yes       Yes                                    +---------+---------------+---------+-----------+----------+--------------+  SFJ       Full                                                             +---------+---------------+---------+-----------+----------+--------------+  FV Prox   Full                                                             +---------+---------------+---------+-----------+----------+--------------+  FV Mid    Full                                                             +---------+---------------+---------+-----------+----------+--------------+  FV Distal Full                                                             +---------+---------------+---------+-----------+----------+--------------+  PFV       Full                                                             +---------+---------------+---------+-----------+----------+--------------+  POP       Full            Yes       Yes                                    +---------+---------------+---------+-----------+----------+--------------+  PTV       Full                                                              +---------+---------------+---------+-----------+----------+--------------+  PERO      Full                                                             +---------+---------------+---------+-----------+----------+--------------+   +---------+---------------+---------+-----------+----------+--------------+  LEFT      Compressibility Phasicity Spontaneity Properties Thrombus Aging  +---------+---------------+---------+-----------+----------+--------------+  CFV       Full            Yes       Yes                                    +---------+---------------+---------+-----------+----------+--------------+  SFJ       Full                                                             +---------+---------------+---------+-----------+----------+--------------+  FV Prox   Full                                                             +---------+---------------+---------+-----------+----------+--------------+  FV Mid    Full                                                             +---------+---------------+---------+-----------+----------+--------------+  FV Distal Full                                                             +---------+---------------+---------+-----------+----------+--------------+  PFV       Full                                                             +---------+---------------+---------+-----------+----------+--------------+  POP       Full            Yes       Yes                                    +---------+---------------+---------+-----------+----------+--------------+  PTV       Full                                                             +---------+---------------+---------+-----------+----------+--------------+  PERO      Full                                                             +---------+---------------+---------+-----------+----------+--------------+     Summary: RIGHT: - There is no evidence of deep vein thrombosis in the lower  extremity. However, portions of this examination were limited- see technologist comments above.  - No cystic structure found in the popliteal fossa.  LEFT: - There is no evidence of deep vein thrombosis in the lower extremity. However, portions of this examination were limited- see technologist comments above.  - No cystic structure found in the popliteal fossa.  *See table(s) above for measurements and observations. Electronically signed by Monica Martinez MD on 11/03/2019 at 4:09:50 PM.    Final    [place] EKG SITE RITE  Result Date: 11/04/2019 If Site Rite image not attached, placement could not be confirmed due to current cardiac rhythm.   Time Spent in minutes  30   Lala Lund M.D on 11/05/2019 at 8:54 AM  To page go to [email or web address] - password [email or web address]

## 2019-11-05 NOTE — Plan of Care (Signed)
  Problem: Clinical Measurements: Goal: Respiratory complications will improve Outcome: Progressing   

## 2019-11-06 LAB — CBC
HCT: 44.5 % (ref 36.0–46.0)
Hemoglobin: 14.2 g/dL (ref 12.0–15.0)
MCH: 30.5 pg (ref 26.0–34.0)
MCHC: 31.9 g/dL (ref 30.0–36.0)
MCV: 95.5 fL (ref 80.0–100.0)
Platelets: 400 10*3/uL (ref 150–400)
RBC: 4.66 MIL/uL (ref 3.87–5.11)
RDW: 13.2 % (ref 11.5–15.5)
WBC: 6.2 10*3/uL (ref 4.0–10.5)
nRBC: 0 % (ref 0.0–0.2)

## 2019-11-06 LAB — CULTURE, BLOOD (ROUTINE X 2)
Culture: NO GROWTH
Culture: NO GROWTH
Special Requests: ADEQUATE
Special Requests: ADEQUATE

## 2019-11-06 LAB — COMPREHENSIVE METABOLIC PANEL
ALT: 24 U/L (ref 0–44)
AST: 31 U/L (ref 15–41)
Albumin: 2.4 g/dL — ABNORMAL LOW (ref 3.5–5.0)
Alkaline Phosphatase: 55 U/L (ref 38–126)
Anion gap: 9 (ref 5–15)
BUN: 20 mg/dL (ref 8–23)
CO2: 32 mmol/L (ref 22–32)
Calcium: 8.3 mg/dL — ABNORMAL LOW (ref 8.9–10.3)
Chloride: 99 mmol/L (ref 98–111)
Creatinine, Ser: 1.29 mg/dL — ABNORMAL HIGH (ref 0.44–1.00)
GFR calc Af Amer: 48 mL/min — ABNORMAL LOW (ref >60)
GFR calc non Af Amer: 41 mL/min — ABNORMAL LOW (ref >60)
Glucose, Bld: 111 mg/dL — ABNORMAL HIGH (ref 70–99)
Potassium: 3.6 mmol/L (ref 3.5–5.1)
Sodium: 140 mmol/L (ref 135–145)
Total Bilirubin: 1.3 mg/dL — ABNORMAL HIGH (ref 0.3–1.2)
Total Protein: 5 g/dL — ABNORMAL LOW (ref 6.5–8.1)

## 2019-11-06 LAB — GLUCOSE, CAPILLARY
Glucose-Capillary: 115 mg/dL — ABNORMAL HIGH (ref 70–99)
Glucose-Capillary: 276 mg/dL — ABNORMAL HIGH (ref 70–99)
Glucose-Capillary: 158 mg/dL — ABNORMAL HIGH (ref 70–99)
Glucose-Capillary: 109 mg/dL — ABNORMAL HIGH (ref 70–99)

## 2019-11-06 LAB — PROCALCITONIN: Procalcitonin: <0.1 ng/mL

## 2019-11-06 LAB — BRAIN NATRIURETIC PEPTIDE: B Natriuretic Peptide: 45.5 pg/mL (ref 0.0–100.0)

## 2019-11-06 LAB — C-REACTIVE PROTEIN: CRP: 0.6 mg/dL (ref <1.0)

## 2019-11-06 LAB — MAGNESIUM: Magnesium: 2.2 mg/dL (ref 1.7–2.4)

## 2019-11-06 LAB — D-DIMER, QUANTITATIVE: D-Dimer, Quant: 3.05 ug/mL-FEU — ABNORMAL HIGH (ref 0.00–0.50)

## 2019-11-06 MED ORDER — SENNOSIDES-DOCUSATE SODIUM 8.6-50 MG PO TABS
2.0000 | ORAL_TABLET | Freq: Two times a day (BID) | ORAL | Status: DC
  Administered 2019-11-06 – 2019-11-10 (×10): 2 via ORAL
  Filled 2019-11-06 (×15): qty 2

## 2019-11-06 MED ORDER — POLYETHYLENE GLYCOL 3350 17 G PO PACK
34.0000 g | PACK | Freq: Every day | ORAL | Status: DC
  Administered 2019-11-06 – 2019-11-07 (×2): 34 g via ORAL
  Filled 2019-11-06 (×2): qty 2

## 2019-11-06 NOTE — Plan of Care (Signed)
  Problem: Clinical Measurements: Goal: Respiratory complications will improve Outcome: Progressing   

## 2019-11-06 NOTE — Progress Notes (Signed)
PROGRESS NOTE                                                                                                                                                                                                             [name], is a 74 y.o. female, DOB - 1945-07-19, [id number]  Admit date - 10/28/2019   Admitting Physician Julia Klein, MD  Outpatient Primary MD for the patient is Julia Gunner Coralie Keens, NP  LOS - 9  Chief Complaint  Patient presents with  . Tachycardia       Brief Narrative    - Julia Mcfarland is a 74 y.o. female with medical history significant of chronic venous insufficiency, arthritis, and morbid obesity presented complaints of palpitations, generalized malaise, and shortness of breath over the last 3 days.  Associated symptoms included chills, generalized weakness, and a nonproductive cough.  Upon admission she was found to be in A. fib with RVR with heart rates into the 170s.     She was admitted by cardiology team on 26 April subsequently found to have COVID-19 pneumonia and hospitalist team was consulted.  She had by that time developed severe parenchymal injury with severe hypoxia, briefly was in ICU for heated high flow now back to progressive care unit with heated high flow, she remains with significant oxygen requirement and heated high flow, as well hospital stay was noted for A. fib with RVR, for which cardiology is consulted.    Subjective:   Patient in bed, appears comfortable, denies any headache, no fever, no chest pain or pressure, still reports dyspnea, mainly with activity.  He does report some constipation asking for stool softener.    Assessment  & Plan :      Acute hypoxic respiratory failure due to [id number] pneumonia complicated by pneumomediastinum.   - She was initially treated for the first 2 to 3 days of hospitalization with IV steroids and remdesivir, however she continued to decline, with  significant increase of oxygen requirement, which she required heated high flow nasal cannula , she remains on heated high flow, this morning she is on 3 L heated high flow . -She received Actemra . -Use incentive spirometry and flutter valve . -Overall she remains very tenuous, continue to monitor her progressive care . - No signs of bacterial infection or esophageal rupture.  No chest or abdominal pain.  Gradually improving continue to monitor.  SpO2: 93 % O2 Flow Rate (L/min): 25 L/min  FiO2 (%): 40 %    Recent Labs  Lab 11/02/19 0500 11/03/19 0615 11/04/19 0500 11/04/19 2016 11/05/19 0517 11/06/19 0546  NA 145 144  --  141 139 140  K 3.4* 3.3*  --  3.3* 2.9* 3.6  CL 102 99  --  98 98 99  CO2 29 31  --  32 32 32  GLUCOSE 95 99  --  163* 127* 111*  BUN 27* 30*  --  24* 24* 20  CREATININE 1.18* 1.24*  --  1.14* 1.17* 1.29*  CALCIUM 8.8* 8.5*  --  7.9* 7.9* 8.3*  AST 30 24  --  25 24 31   ALT 32 26  --  23 23 24   ALKPHOS 63 62  --  58 56 55  BILITOT 1.0 1.1  --  1.0 0.8 1.3*  ALBUMIN 2.6* 2.5*  --  2.4* 2.4* 2.4*  MG 2.2 2.3  --  2.2 2.1 2.2  CRP 3.3* 1.9*  --  0.9 0.8 0.6  DDIMER 16.96* 15.08*  --  7.92* 7.04* 3.05*  PROCALCITON <0.10 <0.10  --  <0.10 <0.10 <0.10  BNP 181.6* 82.2 74.4  --  74.3 45.5    Recent Labs  Lab 11/02/19 0500 11/03/19 0615 11/04/19 0500 11/04/19 2016 11/05/19 0517 11/06/19 0546  CRP 3.3* 1.9*  --  0.9 0.8 0.6  DDIMER 16.96* 15.08*  --  7.92* 7.04* 3.05*  BNP 181.6* 82.2 74.4  --  74.3 45.5  PROCALCITON <0.10 <0.10  --  <0.10 <0.10 <0.10      Lab Results  Component Value Date   TSH 0.725 10/28/2019    Paroxysmal A. fib RVR.  -  Mali vas 2 score of greater than 3.  Cardiology on board, stable TSH and echocardiogram, initially required IV amiodarone and digoxin, currently transition to oral amiodarone, oral Cardizem dose increased on 11/04/2019 for better control, also gentle hydration with fluids, continue anticoagulation with  Loevnox.  Obesity with BMI of 51.   - Follow with PCP for weight loss.   AKI.  Clinically now appears dehydrated, gentle IV fluids and monitor.   Metabolic encephalopathy.  Was due to hypoxia completely resolved after heated high flow, p.m. Seroquel.    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   Hypokalemia.  Replaced IV + PO.     Rising D-dimer despite being on Eliquis.  This is likely due to inflammation from COVID-19, leg ultrasound is negative, for now full dose Lovenox till D-dimer starts trending down thereafter switch back to Eliquis.    9.  Hypotension.  Due to aggressive diuresis in ICU.  Hydrate with IV fluids and monitor closely.    10. DM type II.  Lantus plus sliding scale monitor and adjust.    Lab Results  Component Value Date   HGBA1C 6.3 (H) 10/28/2019   CBG (last 3)  Recent Labs    11/05/19 2055 11/06/19 0821 11/06/19 1149  GLUCAP 178* 115* 276*      Family Communication  : Husband Julia Mcfarland [phone] on 10/30/2019, 10/31/19, 11/01/19 husband clearly explained that Ms. Julia Mcfarland's situation remains quite critical.  Updated 11/03/2019, message left on 11/04/2019 at 2:33 PM, he is now Hospitalized at [facility]. - D/W grandson 5/5  Code Status :  Full  Disposition Plan  : Patient is not medically stable for  discharge, she remains in significant oxygen requirement with heated high flow at 25 L/min.  Consults  : Cards  Procedures  :    Leg [place] - No DVT  TTE -  1. Normal LV function; mild LAE.  2. Left ventricular ejection fraction, by estimation, is 55 to 60%. The left ventricle has normal function. The left ventricle has no regional wall motion abnormalities. Left ventricular diastolic function could not be evaluated.  3. Right ventricular systolic function is normal. The right  ventricular size is normal. Tricuspid regurgitation signal is inadequate for assessing PA pressure.  4. Left atrial size was mildly dilated.  5. The mitral valve is normal in structure. Trivial mitral valve regurgitation. No evidence of mitral stenosis.  6. The aortic valve is tricuspid. Aortic valve regurgitation is not visualized. No aortic stenosis is present.  7. The inferior vena cava is normal in size with greater than 50% respiratory variability, suggesting right atrial pressure of 3 mmHg.  DVT Prophylaxis  :  Eliquis  Lab Results  Component Value Date   PLT 400 11/06/2019    Diet :  Diet Order            DIET SOFT Room service appropriate? Yes; Fluid consistency: Thin  Diet effective now               Inpatient Medications Scheduled Meds: . amiodarone  200 mg Oral BID  . Chlorhexidine Gluconate Cloth  6 each Topical Daily  . diltiazem  90 mg Oral Q8H  . enoxaparin (LOVENOX) injection  120 mg Subcutaneous Q12H  . insulin aspart  0-20 Units Subcutaneous TID WC  . insulin detemir  15 Units Subcutaneous Daily  . midodrine  10 mg Oral TID WC  . pantoprazole  40 mg Oral Daily  . polyethylene glycol  34 g Oral Daily  . QUEtiapine  25 mg Oral QHS  . senna-docusate  2 tablet Oral BID   Continuous Infusions:  PRN Meds:.acetaminophen, albuterol, metoprolol tartrate, ondansetron (ZOFRAN) IV, sodium chloride flush  Antibiotics  :   Anti-infectives (From admission, onward)   Start     Dose/Rate Route Frequency Ordered Stop   10/30/19 1000  remdesivir 100 mg in sodium chloride 0.9 % 100 mL IVPB     100 mg 200 mL/hr over 30 Minutes Intravenous Daily 10/29/19 0914 11/02/19 0956   10/29/19 1000  remdesivir 200 mg in sodium chloride 0.9% 250 mL IVPB     200 mg 580 mL/hr over 30 Minutes Intravenous Once 10/29/19 0914 10/29/19 1236         Objective:   Vitals:   11/06/19 0800 11/06/19 1113 11/06/19 1335 11/06/19 1413  BP: 111/77 118/89 101/90   Pulse: 95 86    Resp:  19     Temp: 98 F (36.7 C) 98.8 F (37.1 C)    TempSrc: Oral Oral    SpO2: 96% 97%  93%  Weight:      Height:        SpO2: 93 % O2 Flow Rate (L/min): 25 L/min FiO2 (%): 40 %  Wt Readings from Last 3 Encounters:  11/06/19 125.3 kg  09/19/19 132 kg  02/18/19 127.9 kg     Intake/Output Summary (Last 24 hours) at 11/06/2019 1440 Last data filed at 11/06/2019 1300 Gross per 24 hour  Intake 1122.92 ml  Output 1700 ml  Net -577.08 ml     Physical Exam  Awake Alert, Oriented X 3, No new F.N deficits, Normal affect Symmetrical  Chest wall movement, Good air movement bilaterally, no wheezing or rails RRR,No Gallops,Rubs or new Murmurs, No Parasternal Heave +ve B.Sounds, Abd Soft, No tenderness, No rebound - guarding or rigidity. No Cyanosis, Clubbing , no edema, No new Rash or bruise      Data Review:    Recent Labs  Lab 11/03/19 0615 11/04/19 0742 11/04/19 2317 11/05/19 0517 11/06/19 0546  WBC 12.1* 8.6 6.3 7.6 6.2  HGB 16.2* 16.7* 14.6 15.0 14.2  HCT 50.0* 52.1* 45.6 46.3* 44.5  PLT 404* 375 396 408* 400  MCV 92.4 93.5 94.8 94.9 95.5  MCH 29.9 30.0 30.4 30.7 30.5  MCHC 32.4 32.1 32.0 32.4 31.9  RDW 12.8 12.9 13.0 13.0 13.2    Recent Labs  Lab 11/02/19 0500 11/03/19 0615 11/04/19 0500 11/04/19 2016 11/05/19 0517 11/06/19 0546  NA 145 144  --  141 139 140  K 3.4* 3.3*  --  3.3* 2.9* 3.6  CL 102 99  --  98 98 99  CO2 29 31  --  32 32 32  GLUCOSE 95 99  --  163* 127* 111*  BUN 27* 30*  --  24* 24* 20  CREATININE 1.18* 1.24*  --  1.14* 1.17* 1.29*  CALCIUM 8.8* 8.5*  --  7.9* 7.9* 8.3*  AST 30 24  --  25 24 31   ALT 32 26  --  23 23 24   ALKPHOS 63 62  --  58 56 55  BILITOT 1.0 1.1  --  1.0 0.8 1.3*  ALBUMIN 2.6* 2.5*  --  2.4* 2.4* 2.4*  MG 2.2 2.3  --  2.2 2.1 2.2  CRP 3.3* 1.9*  --  0.9 0.8 0.6  DDIMER 16.96* 15.08*  --  7.92* 7.04* 3.05*  PROCALCITON <0.10 <0.10  --  <0.10 <0.10 <0.10  BNP 181.6* 82.2 74.4  --  74.3 45.5    Recent Labs  Lab  11/02/19 0500 11/03/19 0615 11/04/19 0500 11/04/19 2016 11/05/19 0517 11/06/19 0546  CRP 3.3* 1.9*  --  0.9 0.8 0.6  DDIMER 16.96* 15.08*  --  7.92* 7.04* 3.05*  BNP 181.6* 82.2 74.4  --  74.3 45.5  PROCALCITON <0.10 <0.10  --  <0.10 <0.10 <0.10    ------------------------------------------------------------------------------------------------------------------ No results for input(s): CHOL, HDL, LDLCALC, TRIG, CHOLHDL, LDLDIRECT in the last 72 hours.  Lab Results  Component Value Date   HGBA1C 6.3 (H) 10/28/2019   ------------------------------------------------------------------------------------------------------------------ No results for input(s): TSH, T4TOTAL, T3FREE, THYROIDAB in the last 72 hours.  Invalid input(s): FREET3 ------------------------------------------------------------------------------------------------------------------ No results for input(s): VITAMINB12, FOLATE, FERRITIN, TIBC, IRON, RETICCTPCT in the last 72 hours.  Coagulation profile No results for input(s): INR, PROTIME in the last 168 hours.  Recent Labs    11/05/19 0517 11/06/19 0546  DDIMER 7.04* 3.05*    Cardiac Enzymes No results for input(s): CKMB, TROPONINI, MYOGLOBIN in the last 168 hours.  Invalid input(s): CK ------------------------------------------------------------------------------------------------------------------    Component Value Date/Time   BNP 45.5 11/06/2019 0546    Micro Results Recent Results (from the past 240 hour(s))  SARS CORONAVIRUS 2 (TAT 6-24 HRS) Nasopharyngeal Nasopharyngeal Swab     Status: Abnormal   Collection Time: 10/28/19  4:18 PM   Specimen: Nasopharyngeal Swab  Result Value Ref Range Status   SARS Coronavirus 2 POSITIVE (A) NEGATIVE Final    Comment: RESULT CALLED TO, READ BACK BY AND VERIFIED WITH: M. RUGGIERO,RN 0020 10/29/2019 T. TYSOR (NOTE) SARS-CoV-2 target nucleic acids are DETECTED. The SARS-CoV-2 RNA is generally detectable in  upper and lower respiratory specimens during the acute phase of infection. Positive results are indicative of the presence of SARS-CoV-2 RNA. Clinical correlation with patient history and other diagnostic information is  necessary to determine patient infection status. Positive results do not rule out bacterial infection or co-infection with other viruses.  The expected result is Negative. Fact Sheet for Patients: SugarRoll.be Fact Sheet for Healthcare Providers: https://www.woods-mathews.com/ This test is not yet approved or cleared by the [place] FDA and  has been authorized for detection and/or diagnosis of SARS-CoV-2 by FDA under an Emergency Use Authorization (EUA). This EUA will remain  in effect (meaning this test can be used) for  the duration of the COVID-19 declaration under Section 564(b)(1) of the Act, 21 U.S.C. section 360bbb-3(b)(1), unless the authorization is terminated or revoked sooner. Performed at [facility], [place] [address], [place], [place] [address]   Culture, blood (Routine X 2) w Reflex to ID Panel     Status: None   Collection Time: 11/01/19 12:17 AM   Specimen: BLOOD LEFT HAND  Result Value Ref Range Status   Specimen Description BLOOD LEFT HAND  Final   Special Requests   Final    BOTTLES DRAWN AEROBIC AND ANAEROBIC Blood Culture adequate volume   Culture   Final    NO GROWTH 5 DAYS Performed at [facility], [place] [address], [place], [place] [address]    Report Status 11/06/2019 FINAL  Final  Culture, blood (Routine X 2) w Reflex to ID Panel     Status: None   Collection Time: 11/01/19  1:02 AM   Specimen: BLOOD  Result Value Ref Range Status   Specimen Description BLOOD SITE NOT SPECIFIED  Final   Special Requests   Final    BOTTLES DRAWN AEROBIC AND ANAEROBIC Blood Culture adequate volume   Culture   Final    NO GROWTH 5 DAYS Performed at [facility], [place] [address],  [place], [place] [address]    Report Status 11/06/2019 FINAL  Final    Radiology Reports DG Chest Port 1 View  Result Date: 11/05/2019 CLINICAL DATA:  Shortness of breath EXAM: PORTABLE CHEST 1 VIEW COMPARISON:  Nov 04, 2019 and Nov 02, 2019 FINDINGS: Central catheter tip is in the superior vena cava. No pneumothorax. Note that there is subcutaneous air in the supraclavicular regions, stable. There is also a degree of pneumomediastinum, less pronounced than on study from 3 days prior. There is airspace opacity bilaterally with consolidation in the right lower lobe. No new opacity evident. Heart size and pulmonary vascularity are normal. No adenopathy. There is degenerative change in thoracic spine. IMPRESSION: Multifocal airspace opacity with consolidation right base, similar to recent studies. Stable cardiac silhouette. Central catheter tip in superior vena cava. No pneumothorax. However, there is pneumomediastinum as well as subcutaneous air in the supraclavicular regions, more on the right than the left. Electronically Signed   By: Lowella Grip III M.D.   On: 11/05/2019 09:11   DG Chest Port 1 View  Result Date: 11/04/2019 CLINICAL DATA:  PICC line placement EXAM: PORTABLE CHEST 1 VIEW COMPARISON:  11/04/2019 FINDINGS: Right PICC line is been placed. The tip is at the cavoatrial junction. Stable diffuse bilateral airspace disease. No visible effusion or pneumothorax. Subcutaneous emphysema again noted in the right neck base, stable. No acute bony abnormality. IMPRESSION: Right PICC line tip at the cavoatrial junction. Stable bilateral airspace disease. Electronically Signed   By: Rolm Baptise  M.D.   On: 11/04/2019 18:46   DG Chest Port 1 View  Result Date: 11/04/2019 CLINICAL DATA:  Short of breath. EXAM: PORTABLE CHEST 1 VIEW COMPARISON:  11/02/2019 FINDINGS: Normal heart size. Extensive, bilateral pulmonary opacities are identified which appear unchanged from previous exam. No pneumothorax.  Subcutaneous emphysema is identified within the right neck. Etiology indeterminate. IMPRESSION: 1. No change in aeration a lungs compared with previous exam. 2. Right neck subcu emphysema noted. Electronically Signed   By: Kerby Moors M.D.   On: 11/04/2019 09:33   DG Chest Port 1 View  Result Date: 11/02/2019 CLINICAL DATA:  Shortness of breath, COVID positive EXAM: PORTABLE CHEST 1 VIEW COMPARISON:  11/01/2019 FINDINGS: Diffuse bilateral pulmonary opacities. Worsening left lung aeration. No pneumothorax. Pneumomediastinum is similar. Stable cardiomediastinal contours. No significant pleural effusion. IMPRESSION: Bilateral pneumonia with worsening lung aeration on the left. Similar pneumomediastinum. Electronically Signed   By: Macy Mis M.D.   On: 11/02/2019 07:04   DG Chest Port 1 View  Result Date: 11/01/2019 CLINICAL DATA:  Acute respiratory failure EXAM: PORTABLE CHEST 1 VIEW COMPARISON:  Yesterday FINDINGS: Pneumomediastinum is newly seen along both mediastinal contours. No visible pneumothorax. Extensive bilateral pneumonia. Cardiomegaly and aortic tortuosity. These results will be called to the ordering clinician or representative by the Radiologist Assistant, and communication documented in the PACS or [employer]. IMPRESSION: 1. New pneumomediastinum. 2. Stable bilateral pneumonia.  No visible pneumothorax. Electronically Signed   By: Monte Fantasia M.D.   On: 11/01/2019 07:21   DG CHEST PORT 1 VIEW  Result Date: 10/31/2019 CLINICAL DATA:  Tachypnea EXAM: PORTABLE CHEST 1 VIEW COMPARISON:  10/31/2019, 10/30/2019, 10/28/2019 FINDINGS: Rotated patient. Fairly extensive bilateral ground-glass opacities and consolidations probably without significant change allowing for rotated patient. Enlarged cardiomediastinal silhouette also exaggerated by rotation. No pleural effusion or pneumothorax. IMPRESSION: Likely no significant interval change in fairly extensive bilateral airspace disease  and consolidations allowing for patient rotation Electronically Signed   By: Donavan Foil M.D.   On: 10/31/2019 19:56   DG Chest Port 1 View  Result Date: 10/31/2019 CLINICAL DATA:  Shortness of breath. COVID-19 viral pneumonia. EXAM: PORTABLE CHEST 1 VIEW COMPARISON:  02/21/2020 FINDINGS: Heart size remains within normal limits. Bilateral diffuse heterogeneous airspace opacity shows no significant change. No evidence of pneumothorax or pleural effusion. IMPRESSION: No significant change in diffuse heterogeneous airspace disease. Electronically Signed   By: Marlaine Hind M.D.   On: 10/31/2019 08:16   DG Chest Port 1 View  Result Date: 10/30/2019 CLINICAL DATA:  COVID positive. EXAM: PORTABLE CHEST 1 VIEW COMPARISON:  Chest x-ray dated October 28, 2019. FINDINGS: Stable cardiomediastinal silhouette. Normal pulmonary vascularity. Low lung volumes. Patchy interstitial and airspace opacities throughout both lungs, mildly worsened at the left lung base. No pleural effusion or pneumothorax. No acute osseous abnormality. IMPRESSION: 1. Multifocal pneumonia, mildly worsened at the left lung base. Electronically Signed   By: Titus Dubin M.D.   On: 10/30/2019 08:26   DG Chest Portable 1 View  Result Date: 10/28/2019 CLINICAL DATA:  Dyspnea with exertion. EXAM: PORTABLE CHEST 1 VIEW COMPARISON:  February 28, 2017. FINDINGS: Stable cardiomediastinal silhouette. Central pulmonary vascular congestion is noted. Bilateral lung opacities are noted which may represent edema or possibly multifocal pneumonia. Atherosclerosis of thoracic aorta is noted. No pneumothorax or pleural effusion is noted. Bony thorax is unremarkable. IMPRESSION: Aortic atherosclerosis. Central pulmonary vascular congestion is noted. Bilateral lung opacities are noted which may represent edema or possibly multifocal pneumonia. Aortic  Atherosclerosis (ICD10-I70.0). Electronically Signed   By: Marijo Conception M.D.   On: 10/28/2019 15:42    ECHOCARDIOGRAM COMPLETE  Result Date: 10/29/2019    ECHOCARDIOGRAM REPORT   Patient Name:   ILIANA THEIL Date of Exam: 10/29/2019 Medical Rec #:  [id number]      Height:       61.0 in Accession #:    [id number]     Weight:       270.0 lb Date of Birth:  08/16/1945      BSA:          2.146 m Patient Age:    18 years       BP:           136/73 mmHg Patient Gender: F              HR:           135 bpm. Exam Location:  Inpatient Procedure: 2D Echo, Cardiac Doppler and Color Doppler Indications:    I48.0 Paroxysmal atrial fibrillation  History:        Patient has no prior history of Echocardiogram examinations.                 COVID-19 Positive.  Sonographer:    Jonelle Sidle Dance Referring Phys: [place]  1. Normal LV function; mild LAE.  2. Left ventricular ejection fraction, by estimation, is 55 to 60%. The left ventricle has normal function. The left ventricle has no regional wall motion abnormalities. Left ventricular diastolic function could not be evaluated.  3. Right ventricular systolic function is normal. The right ventricular size is normal. Tricuspid regurgitation signal is inadequate for assessing PA pressure.  4. Left atrial size was mildly dilated.  5. The mitral valve is normal in structure. Trivial mitral valve regurgitation. No evidence of mitral stenosis.  6. The aortic valve is tricuspid. Aortic valve regurgitation is not visualized. No aortic stenosis is present.  7. The inferior vena cava is normal in size with greater than 50% respiratory variability, suggesting right atrial pressure of 3 mmHg. FINDINGS  Left Ventricle: Left ventricular ejection fraction, by estimation, is 55 to 60%. The left ventricle has normal function. The left ventricle has no regional wall motion abnormalities. The left ventricular internal cavity size was normal in size. There is  no left ventricular hypertrophy. Left ventricular diastolic function could not be evaluated due to atrial  fibrillation. Left ventricular diastolic function could not be evaluated. Right Ventricle: The right ventricular size is normal. Right ventricular systolic function is normal. Tricuspid regurgitation signal is inadequate for assessing PA pressure. Left Atrium: Left atrial size was mildly dilated. Right Atrium: Right atrial size was normal in size. Pericardium: There is no evidence of pericardial effusion. Mitral Valve: The mitral valve is normal in structure. Normal mobility of the mitral valve leaflets. Trivial mitral valve regurgitation. No evidence of mitral valve stenosis. Tricuspid Valve: The tricuspid valve is normal in structure. Tricuspid valve regurgitation is mild . No evidence of tricuspid stenosis. Aortic Valve: The aortic valve is tricuspid. Aortic valve regurgitation is not visualized. No aortic stenosis is present. Pulmonic Valve: The pulmonic valve was not well visualized. Pulmonic valve regurgitation is trivial. No evidence of pulmonic stenosis. Aorta: The aortic root is normal in size and structure. Venous: The inferior vena cava is normal in size with greater than 50% respiratory variability, suggesting right atrial pressure of 3 mmHg.  Additional Comments: Normal LV function; mild LAE.  LEFT  VENTRICLE PLAX 2D LVIDd:         4.00 cm LVIDs:         2.85 cm LV PW:         1.10 cm LV IVS:        1.13 cm LVOT diam:     1.85 cm LV SV:         40 LV SV Index:   19 LVOT Area:     2.69 cm  RIGHT VENTRICLE             IVC RV Basal diam:  2.82 cm     IVC diam: 1.67 cm RV S prime:     12.20 cm/s TAPSE (M-mode): 0.9 cm LEFT ATRIUM           Index       RIGHT ATRIUM           Index LA diam:      3.20 cm 1.49 cm/m  RA Area:     15.80 cm LA Vol (A2C): 74.0 ml 34.49 ml/m RA Volume:   39.80 ml  18.55 ml/m LA Vol (A4C): 61.6 ml 28.71 ml/m  AORTIC VALVE LVOT Vmax:   122.50 cm/s LVOT Vmean:  66.600 cm/s LVOT VTI:    0.150 m  AORTA Ao Root diam: 3.30 cm Ao Asc diam:  3.20 cm MITRAL VALVE MV Area (PHT): 3.62 cm     SHUNTS MV Decel Time: 210 msec    Systemic VTI:  0.15 m MV E velocity: 53.35 cm/s  Systemic Diam: 1.85 cm Kirk Ruths MD Electronically signed by Kirk Ruths MD Signature Date/Time: 10/29/2019/2:08:19 PM    Final    VAS [place] LOWER EXTREMITY VENOUS (DVT)  Result Date: 11/03/2019  Lower Venous DVTStudy Indications: Swelling.  Risk Factors: COVID 19 positive. Limitations: Body habitus and poor ultrasound/tissue interface. Comparison Study: No prior studies. Performing Technologist: Oliver Hum RVT  Examination Guidelines: A complete evaluation includes B-mode imaging, spectral Doppler, color Doppler, and power Doppler as needed of all accessible portions of each vessel. Bilateral testing is considered an integral part of a complete examination. Limited examinations for reoccurring indications may be performed as noted. The reflux portion of the exam is performed with the patient in reverse Trendelenburg.  +---------+---------------+---------+-----------+----------+--------------+ RIGHT    CompressibilityPhasicitySpontaneityPropertiesThrombus Aging +---------+---------------+---------+-----------+----------+--------------+ CFV      Full           Yes      Yes                                 +---------+---------------+---------+-----------+----------+--------------+ SFJ      Full                                                        +---------+---------------+---------+-----------+----------+--------------+ FV Prox  Full                                                        +---------+---------------+---------+-----------+----------+--------------+ FV Mid   Full                                                        +---------+---------------+---------+-----------+----------+--------------+  FV DistalFull                                                        +---------+---------------+---------+-----------+----------+--------------+ PFV      Full                                                         +---------+---------------+---------+-----------+----------+--------------+ POP      Full           Yes      Yes                                 +---------+---------------+---------+-----------+----------+--------------+ PTV      Full                                                        +---------+---------------+---------+-----------+----------+--------------+ PERO     Full                                                        +---------+---------------+---------+-----------+----------+--------------+   +---------+---------------+---------+-----------+----------+--------------+ LEFT     CompressibilityPhasicitySpontaneityPropertiesThrombus Aging +---------+---------------+---------+-----------+----------+--------------+ CFV      Full           Yes      Yes                                 +---------+---------------+---------+-----------+----------+--------------+ SFJ      Full                                                        +---------+---------------+---------+-----------+----------+--------------+ FV Prox  Full                                                        +---------+---------------+---------+-----------+----------+--------------+ FV Mid   Full                                                        +---------+---------------+---------+-----------+----------+--------------+ FV DistalFull                                                        +---------+---------------+---------+-----------+----------+--------------+  PFV      Full                                                        +---------+---------------+---------+-----------+----------+--------------+ POP      Full           Yes      Yes                                 +---------+---------------+---------+-----------+----------+--------------+ PTV      Full                                                         +---------+---------------+---------+-----------+----------+--------------+ PERO     Full                                                        +---------+---------------+---------+-----------+----------+--------------+     Summary: RIGHT: - There is no evidence of deep vein thrombosis in the lower extremity. However, portions of this examination were limited- see technologist comments above.  - No cystic structure found in the popliteal fossa.  LEFT: - There is no evidence of deep vein thrombosis in the lower extremity. However, portions of this examination were limited- see technologist comments above.  - No cystic structure found in the popliteal fossa.  *See table(s) above for measurements and observations. Electronically signed by Monica Martinez MD on 11/03/2019 at 4:09:50 PM.    Final    [place] EKG SITE RITE  Result Date: 11/04/2019 If Site Rite image not attached, placement could not be confirmed due to current cardiac rhythm.    Phillips Climes M.D on 11/06/2019 at 2:40 PM  To page go to [email or web address] - password [email or web address]

## 2019-11-07 LAB — COMPREHENSIVE METABOLIC PANEL
ALT: 28 U/L (ref 0–44)
AST: 31 U/L (ref 15–41)
Albumin: 2.5 g/dL — ABNORMAL LOW (ref 3.5–5.0)
Alkaline Phosphatase: 53 U/L (ref 38–126)
Anion gap: 9 (ref 5–15)
BUN: 18 mg/dL (ref 8–23)
CO2: 31 mmol/L (ref 22–32)
Calcium: 8.4 mg/dL — ABNORMAL LOW (ref 8.9–10.3)
Chloride: 98 mmol/L (ref 98–111)
Creatinine, Ser: 1.24 mg/dL — ABNORMAL HIGH (ref 0.44–1.00)
GFR calc Af Amer: 50 mL/min — ABNORMAL LOW (ref >60)
GFR calc non Af Amer: 43 mL/min — ABNORMAL LOW (ref >60)
Glucose, Bld: 95 mg/dL (ref 70–99)
Potassium: 3.4 mmol/L — ABNORMAL LOW (ref 3.5–5.1)
Sodium: 138 mmol/L (ref 135–145)
Total Bilirubin: 1.1 mg/dL (ref 0.3–1.2)
Total Protein: 5.2 g/dL — ABNORMAL LOW (ref 6.5–8.1)

## 2019-11-07 LAB — D-DIMER, QUANTITATIVE: D-Dimer, Quant: 2.24 ug/mL-FEU — ABNORMAL HIGH (ref 0.00–0.50)

## 2019-11-07 LAB — MAGNESIUM: Magnesium: 2 mg/dL (ref 1.7–2.4)

## 2019-11-07 LAB — GLUCOSE, CAPILLARY
Glucose-Capillary: 110 mg/dL — ABNORMAL HIGH (ref 70–99)
Glucose-Capillary: 112 mg/dL — ABNORMAL HIGH (ref 70–99)
Glucose-Capillary: 134 mg/dL — ABNORMAL HIGH (ref 70–99)
Glucose-Capillary: 127 mg/dL — ABNORMAL HIGH (ref 70–99)

## 2019-11-07 LAB — C-REACTIVE PROTEIN: CRP: 0.6 mg/dL (ref <1.0)

## 2019-11-07 MED ORDER — POLYETHYLENE GLYCOL 3350 17 G PO PACK
17.0000 g | PACK | Freq: Two times a day (BID) | ORAL | Status: AC
  Administered 2019-11-08: 17 g via ORAL
  Filled 2019-11-07 (×2): qty 1

## 2019-11-07 MED ORDER — BISACODYL 5 MG PO TBEC
10.0000 mg | DELAYED_RELEASE_TABLET | Freq: Once | ORAL | Status: AC
  Administered 2019-11-07: 10 mg via ORAL
  Filled 2019-11-07: qty 2

## 2019-11-07 MED ORDER — ACETAMINOPHEN 325 MG PO TABS: 650.0000 mg | ORAL_TABLET | Freq: Four times a day (QID) | ORAL | Status: DC | PRN

## 2019-11-07 MED ORDER — POTASSIUM CHLORIDE CRYS ER 20 MEQ PO TBCR
40.0000 meq | EXTENDED_RELEASE_TABLET | Freq: Four times a day (QID) | ORAL | Status: AC
  Administered 2019-11-07 (×2): 40 meq via ORAL
  Filled 2019-11-07 (×2): qty 2

## 2019-11-07 MED ORDER — MIDODRINE HCL 5 MG PO TABS
5.0000 mg | ORAL_TABLET | Freq: Three times a day (TID) | ORAL | Status: DC
  Administered 2019-11-07 – 2019-11-12 (×16): 5 mg via ORAL
  Filled 2019-11-07 (×16): qty 1

## 2019-11-07 NOTE — Progress Notes (Addendum)
Physical Therapy Treatment Note  Patient now on 10L HFNC. Per cardiology note, Afib rate now adequate. Oxygen saturation 89% or better during physical therapy session. Patient declined ambulation trial in room using RW but anticipate patient would be able to tolerance short distance ambulation with RW and one person assist. Continued recommendation for SNF for short term rehabilitation at this time but will continue to re-assess pending patient's length of hospitalization and mobility progress while in the hospital.    11/07/19 1052  PT Visit Information  Last PT Received On 11/07/19  Assistance Needed +1  History of Present Illness 74 year old female admitted 10/28/19 with fatigue, dyspnea, palpitations. She was found to be in Afib with RVR, HR 172 bpm. No structural heart abnormalities. Cardiology consulted. Diltiazem infusion with HR 130s-140s bpm. High-sensitivity troponin mildly elevated. Blood pressures soft. Patient also found to be COVID positive. CXR: pulmonary vascular congestion. No on IV amio and oral digoxin for rate control. On IV Heparin. Rapid response 10/29/19 PM but no interventions given. PMH: chronic venous insufficiency, degenerative joint disease/arthritis status post bilateral knee replacements, seasonal allergies, and morbid obesity  Subjective Data  Subjective Patient reports feeling better.  Precautions  Precautions Fall;Other (comment)  Precaution Comments monitor oxygen saturation, target HR 65-105 bpm  Pain Assessment  Pain Assessment No/denies pain (No complaints of pain, no signs/symptoms of pain)  Cognition  Arousal/Alertness Awake/alert  General Comments Patient requires repetitive cues during session.  Bed Mobility  Overal bed mobility Needs Assistance  Bed Mobility Supine to Sit  Supine to sit HOB elevated;Min assist;Mod assist (HOB approx 50 degrees)  General bed mobility comments Assist to scoot toward EOB.  Transfers  Overall transfer level Needs  assistance  Equipment used Rolling walker (2 wheeled)  Transfers Stand Pivot Transfers;Sit to/from Stand  Sit to [employer] assist  Stand pivot transfers Min assist;From elevated surface  General transfer comment stand-step transfer EOB>chair with bed height increased slightly, sit<>stand from chair, cues for hand placement  Ambulation/Gait  General Gait Details Patient declined ambulation in room.  Balance  Overall balance assessment Needs assistance  Sitting-balance support Feet supported  Sitting balance-Leahy Scale  (Fair+)  Standing balance support No upper extremity supported  Standing balance-Leahy Scale Fair  General Comments  General comments (skin integrity, edema, etc.) Patient now on 10L HFNC with O2 saturation 99% at rest, 94% when eating applesauce, HR 118 bpm, RR 20. Oxygen saturation down to 89% with transfer to chair but quickly recovers to mid to high 90s%.   Exercises  Exercises Other exercises  General Exercises - Lower Extremity  Hip Flexion/Marching Both;AROM;Standing;10 reps  Other Exercises  Other Exercises incentive spirometer x 5 trials, max 257mL  PT - End of Session  Equipment Utilized During Treatment Oxygen  Activity Tolerance Patient limited by fatigue;Patient tolerated treatment well  Patient left in chair;with call bell/phone within reach;with chair alarm set  Nurse Communication Mobility status;Other (comment) (oxygen saturation response)   PT - Assessment/Plan  PT Plan Current plan remains appropriate  PT Visit Diagnosis Unsteadiness on feet (R26.81);Other abnormalities of gait and mobility (R26.89)  PT Frequency (ACUTE ONLY) Min 2X/week  Follow Up Recommendations SNF  PT equipment Rolling walker with 5" wheels;3in1 (PT)  AM-PAC PT "6 Clicks" Mobility Outcome Measure (Version 2)  Help needed turning from your back to your side while in a flat bed without using bedrails? 3  Help needed moving from lying on your back to sitting on the side of a  flat bed  without using bedrails? 2  Help needed moving to and from a bed to a chair (including a wheelchair)? 2  Help needed standing up from a chair using your arms (e.g., wheelchair or bedside chair)? 3  Help needed to walk in hospital room? 3  Help needed climbing 3-5 steps with a railing?  2  6 Click Score 15  Consider Recommendation of Discharge To: CIR/SNF/LTACH  PT Goal Progression  Progress towards PT goals Progressing toward goals  PT Time Calculation  PT Start Time (ACUTE ONLY) 1052  PT Stop Time (ACUTE ONLY) 1126  PT Time Calculation (min) (ACUTE ONLY) 34 min  PT General Charges  $$ ACUTE PT VISIT 1 Visit  PT Treatments  $Therapeutic Activity 23-37 mins   Birdie Hopes, PT, DPT Acute Rehab [phone] office

## 2019-11-07 NOTE — Plan of Care (Signed)
  Problem: Clinical Measurements: Goal: Respiratory complications will improve Outcome: Progressing   

## 2019-11-07 NOTE — Progress Notes (Signed)
PROGRESS NOTE                                                                                                                                                                                                             [name], is a 74 y.o. female, DOB - 19-Jan-1946, [id number]  Admit date - 10/28/2019   Admitting Physician Sanda Klein, MD  Outpatient Primary MD for the patient is Baity, Coralie Keens, NP  LOS - 10  Chief Complaint  Patient presents with  . Tachycardia       Brief Narrative    - Julia Mcfarland is a 74 y.o. female with medical history significant of chronic venous insufficiency, arthritis, and morbid obesity presented complaints of palpitations, generalized malaise, and shortness of breath over the last 3 days.  Associated symptoms included chills, generalized weakness, and a nonproductive cough.  Upon admission she was found to be in A. fib with RVR with heart rates into the 170s.     She was admitted by cardiology team on 26 April subsequently found to have COVID-19 pneumonia and hospitalist team was consulted.  She had by that time developed severe parenchymal injury with severe hypoxia, briefly was in ICU for heated high flow now back to progressive care unit with heated high flow, she remains with significant oxygen requirement and heated high flow, as well hospital stay was noted for A. fib with RVR, for which cardiology is consulted.    Subjective:   Patient in bed, appears comfortable, denies any headache, no fever, no chest pain or pressure, still reports dyspnea, mainly with activity.  Reports constipation despite stool softener yesterday    Assessment  & Plan :      Acute hypoxic respiratory failure due to [id number] pneumonia complicated by pneumomediastinum.   - She was initially treated for the first 2 to 3 days of hospitalization with IV steroids and remdesivir, however she continued to decline, with significant  increase of oxygen requirement, which she required heated high flow nasal cannula , patient did require 30 L heated high flow nasal cannula yesterday, this morning appears to be improving she is on 12 L high flow nasal cannula. -She received Actemra . -Use incentive spirometry and flutter valve . -Overall she remains very tenuous, continue to monitor her progressive care . - No signs of bacterial infection or esophageal rupture.  No chest or abdominal pain.  Gradually improving continue to monitor. -She did develop pneumomediastinum,  she denies any chest pain today.  SpO2: 100 % O2 Flow Rate (L/min): (S) 12 L/min(decreased based on spo2) FiO2 (%): 40 %    Recent Labs  Lab 11/02/19 0500 11/02/19 0500 11/03/19 0615 11/04/19 0500 11/04/19 2016 11/05/19 0517 11/06/19 0546 11/07/19 0258  NA 145   < > 144  --  141 139 140 138  K 3.4*   < > 3.3*  --  3.3* 2.9* 3.6 3.4*  CL 102   < > 99  --  98 98 99 98  CO2 29   < > 31  --  32 32 32 31  GLUCOSE 95   < > 99  --  163* 127* 111* 95  BUN 27*   < > 30*  --  24* 24* 20 18  CREATININE 1.18*   < > 1.24*  --  1.14* 1.17* 1.29* 1.24*  CALCIUM 8.8*   < > 8.5*  --  7.9* 7.9* 8.3* 8.4*  AST 30   < > 24  --  25 24 31 31   ALT 32   < > 26  --  23 23 24 28   ALKPHOS 63   < > 62  --  58 56 55 53  BILITOT 1.0   < > 1.1  --  1.0 0.8 1.3* 1.1  ALBUMIN 2.6*   < > 2.5*  --  2.4* 2.4* 2.4* 2.5*  MG 2.2   < > 2.3  --  2.2 2.1 2.2 2.0  CRP 3.3*   < > 1.9*  --  0.9 0.8 0.6 0.6  DDIMER 16.96*   < > 15.08*  --  7.92* 7.04* 3.05* 2.24*  PROCALCITON <0.10  --  <0.10  --  <0.10 <0.10 <0.10  --   BNP 181.6*  --  82.2 74.4  --  74.3 45.5  --    < > = values in this interval not displayed.    Recent Labs  Lab 11/02/19 0500 11/02/19 0500 11/03/19 0615 11/04/19 0500 11/04/19 2016 11/05/19 0517 11/06/19 0546 11/07/19 0258  CRP 3.3*   < > 1.9*  --  0.9 0.8 0.6 0.6  DDIMER 16.96*   < > 15.08*  --  7.92* 7.04* 3.05* 2.24*  BNP 181.6*  --  82.2 74.4  --  74.3  45.5  --   PROCALCITON <0.10  --  <0.10  --  <0.10 <0.10 <0.10  --    < > = values in this interval not displayed.      Lab Results  Component Value Date   TSH 0.725 10/28/2019    Paroxysmal A. fib RVR.  -  Mali vas 2 score of greater than 3.  Cardiology on board, stable TSH and echocardiogram, initially required IV amiodarone and digoxin, currently transition to oral amiodarone, oral Cardizem dose increased on 11/04/2019 for better control. -Per cardiology recommendation, continue with Cardizem 90 mg every 8 hours, and consulted.  To discharge, as well decrease amiodarone 200 mg twice daily to once daily prior to discharge . -Continue with full anticoagulation dose Lovenox . -She is on midodrine for soft blood pressure while on heart rate controlling agents, I will decrease midodrine to 5 mg p.o. 3 times daily. -Keep potassium> 4, magnesium> 2  Obesity with BMI of 51.   - Follow with PCP for weight loss.   AKI.   - Clinically now appears dehydrated, gentle IV fluids and monitor.   Metabolic encephalopathy.  -  Was due to hypoxia completely resolved  after heated high flow, p.m. Seroquel.    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  Hypokalemia.   -Repleted  Rising D-dimer despite being on Eliquis.  This is likely due to inflammation from COVID-19, leg ultrasound is negative, for now full dose Lovenox till D-dimer starts trending down thereafter switch back to Eliquis.    Hypotension. -Improved with midodrine, will start to taper.  DM type II.  Lantus plus sliding scale monitor and adjust.    Lab Results  Component Value Date   HGBA1C 6.3 (H) 10/28/2019   CBG (last 3)  Recent Labs    11/06/19 1633 11/06/19 2127 11/07/19 0732  GLUCAP 158* 109* 110*      Family Communication  :  -Husband  is currently hospitalized at [facility] . - D/W grandson 5/5  Code Status :  Full  Disposition Plan  : Patient is not medically stable for discharge, she remains in significant oxygen requirement.  Consults  : Cards  Procedures  :    Leg [place] - No DVT  TTE -  1. Normal LV function; mild LAE.  2. Left ventricular ejection fraction, by estimation, is 55 to 60%. The left ventricle has normal function. The left ventricle has no regional wall motion abnormalities. Left ventricular diastolic function could not be evaluated.  3. Right ventricular systolic function is normal. The right ventricular size is normal. Tricuspid regurgitation signal is inadequate for assessing PA pressure.  4. Left atrial size was mildly dilated.  5. The mitral valve is normal in structure. Trivial mitral valve regurgitation. No evidence of mitral stenosis.  6. The aortic valve is tricuspid. Aortic valve regurgitation is not visualized. No aortic stenosis is present.  7. The inferior vena cava is normal in size with greater than 50% respiratory variability, suggesting right atrial pressure of 3 mmHg.  DVT Prophylaxis  :  Eliquis  Lab Results  Component Value Date   PLT 400 11/06/2019    Diet :  Diet Order            DIET SOFT Room service appropriate? Yes; Fluid consistency: Thin  Diet effective now               Inpatient Medications Scheduled Meds: . amiodarone  200 mg Oral BID  . Chlorhexidine Gluconate Cloth  6 each Topical Daily  . diltiazem  90 mg Oral Q8H  . enoxaparin (LOVENOX) injection  120 mg Subcutaneous Q12H  . insulin aspart  0-20 Units Subcutaneous TID WC  . insulin detemir  15 Units Subcutaneous Daily  . midodrine  10 mg Oral TID WC  . pantoprazole  40 mg Oral Daily  . polyethylene glycol  34 g Oral Daily  . potassium chloride  40 mEq Oral Q6H  . QUEtiapine  25 mg Oral QHS  . senna-docusate  2 tablet Oral BID   Continuous Infusions:  PRN Meds:.acetaminophen, albuterol,  metoprolol tartrate, ondansetron (ZOFRAN) IV, sodium chloride flush  Antibiotics  :   Anti-infectives (From admission, onward)   Start     Dose/Rate Route Frequency Ordered Stop   10/30/19 1000  remdesivir 100 mg in sodium chloride 0.9 % 100 mL IVPB     100 mg 200 mL/hr over 30 Minutes Intravenous Daily 10/29/19 0914 11/02/19 0956   10/29/19 1000  remdesivir 200 mg in sodium chloride  0.9% 250 mL IVPB     200 mg 580 mL/hr over 30 Minutes Intravenous Once 10/29/19 0914 10/29/19 1236         Objective:   Vitals:   11/07/19 0228 11/07/19 0420 11/07/19 0732 11/07/19 0910  BP:  109/73 111/68 117/82  Pulse: 87 91 90 (!) 116  Resp: 14 13 (!) 22 17  Temp:  98.1 F (36.7 C) 98.5 F (36.9 C)   TempSrc:  Oral Oral   SpO2: 96% 94% 95% 100%  Weight:      Height:        SpO2: 100 % O2 Flow Rate (L/min): (S) 12 L/min(decreased based on spo2) FiO2 (%): 40 %  Wt Readings from Last 3 Encounters:  11/07/19 123.8 kg  09/19/19 132 kg  02/18/19 127.9 kg     Intake/Output Summary (Last 24 hours) at 11/07/2019 1107 Last data filed at 11/07/2019 0848 Gross per 24 hour  Intake 300 ml  Output 800 ml  Net -500 ml     Physical Exam  Awake Alert, Oriented X 3, No new F.N deficits, Normal affect Symmetrical Chest wall movement, Good air movement bilaterally, CTAB RRR,No Gallops,Rubs or new Murmurs, No Parasternal Heave +ve B.Sounds, Abd Soft, No tenderness, No rebound - guarding or rigidity. No Cyanosis, Clubbing or edema, No new Rash or bruise      Data Review:    Recent Labs  Lab 11/03/19 0615 11/04/19 0742 11/04/19 2317 11/05/19 0517 11/06/19 0546  WBC 12.1* 8.6 6.3 7.6 6.2  HGB 16.2* 16.7* 14.6 15.0 14.2  HCT 50.0* 52.1* 45.6 46.3* 44.5  PLT 404* 375 396 408* 400  MCV 92.4 93.5 94.8 94.9 95.5  MCH 29.9 30.0 30.4 30.7 30.5  MCHC 32.4 32.1 32.0 32.4 31.9  RDW 12.8 12.9 13.0 13.0 13.2    Recent Labs  Lab 11/02/19 0500 11/02/19 0500 11/03/19 0615 11/04/19 0500  11/04/19 2016 11/05/19 0517 11/06/19 0546 11/07/19 0258  NA 145   < > 144  --  141 139 140 138  K 3.4*   < > 3.3*  --  3.3* 2.9* 3.6 3.4*  CL 102   < > 99  --  98 98 99 98  CO2 29   < > 31  --  32 32 32 31  GLUCOSE 95   < > 99  --  163* 127* 111* 95  BUN 27*   < > 30*  --  24* 24* 20 18  CREATININE 1.18*   < > 1.24*  --  1.14* 1.17* 1.29* 1.24*  CALCIUM 8.8*   < > 8.5*  --  7.9* 7.9* 8.3* 8.4*  AST 30   < > 24  --  25 24 31 31   ALT 32   < > 26  --  23 23 24 28   ALKPHOS 63   < > 62  --  58 56 55 53  BILITOT 1.0   < > 1.1  --  1.0 0.8 1.3* 1.1  ALBUMIN 2.6*   < > 2.5*  --  2.4* 2.4* 2.4* 2.5*  MG 2.2   < > 2.3  --  2.2 2.1 2.2 2.0  CRP 3.3*   < > 1.9*  --  0.9 0.8 0.6 0.6  DDIMER 16.96*   < > 15.08*  --  7.92* 7.04* 3.05* 2.24*  PROCALCITON <0.10  --  <0.10  --  <0.10 <0.10 <0.10  --   BNP 181.6*  --  82.2 74.4  --  74.3 45.5  --    < > = values in this interval not displayed.    Recent Labs  Lab 11/02/19 0500 11/02/19 0500 11/03/19 0615 11/04/19 0500 11/04/19 2016 11/05/19 0517 11/06/19 0546 11/07/19 0258  CRP 3.3*   < > 1.9*  --  0.9 0.8 0.6 0.6  DDIMER 16.96*   < > 15.08*  --  7.92* 7.04* 3.05* 2.24*  BNP 181.6*  --  82.2 74.4  --  74.3 45.5  --   PROCALCITON <0.10  --  <0.10  --  <0.10 <0.10 <0.10  --    < > = values in this interval not displayed.    ------------------------------------------------------------------------------------------------------------------ No results for input(s): CHOL, HDL, LDLCALC, TRIG, CHOLHDL, LDLDIRECT in the last 72 hours.  Lab Results  Component Value Date   HGBA1C 6.3 (H) 10/28/2019   ------------------------------------------------------------------------------------------------------------------ No results for input(s): TSH, T4TOTAL, T3FREE, THYROIDAB in the last 72 hours.  Invalid input(s): FREET3 ------------------------------------------------------------------------------------------------------------------ No results for  input(s): VITAMINB12, FOLATE, FERRITIN, TIBC, IRON, RETICCTPCT in the last 72 hours.  Coagulation profile No results for input(s): INR, PROTIME in the last 168 hours.  Recent Labs    11/06/19 0546 11/07/19 0258  DDIMER 3.05* 2.24*    Cardiac Enzymes No results for input(s): CKMB, TROPONINI, MYOGLOBIN in the last 168 hours.  Invalid input(s): CK ------------------------------------------------------------------------------------------------------------------    Component Value Date/Time   BNP 45.5 11/06/2019 0546    Micro Results Recent Results (from the past 240 hour(s))  SARS CORONAVIRUS 2 (TAT 6-24 HRS) Nasopharyngeal Nasopharyngeal Swab     Status: Abnormal   Collection Time: 10/28/19  4:18 PM   Specimen: Nasopharyngeal Swab  Result Value Ref Range Status   SARS Coronavirus 2 POSITIVE (A) NEGATIVE Final    Comment: RESULT CALLED TO, READ BACK BY AND VERIFIED WITH: M. RUGGIERO,RN 0020 10/29/2019 T. TYSOR (NOTE) SARS-CoV-2 target nucleic acids are DETECTED. The SARS-CoV-2 RNA is generally detectable in upper and lower respiratory specimens during the acute phase of infection. Positive results are indicative of the presence of SARS-CoV-2 RNA. Clinical correlation with patient history and other diagnostic information is  necessary to determine patient infection status. Positive results do not rule out bacterial infection or co-infection with other viruses.  The expected result is Negative. Fact Sheet for Patients: SugarRoll.be Fact Sheet for Healthcare Providers: https://www.woods-mathews.com/ This test is not yet approved or cleared by the [place] FDA and  has been authorized for detection and/or diagnosis of SARS-CoV-2 by FDA under an Emergency Use Authorization (EUA). This EUA will remain  in effect (meaning this test can be used) for  the duration of the COVID-19 declaration under Section 564(b)(1) of the Act, 21  U.S.C. section 360bbb-3(b)(1), unless the authorization is terminated or revoked sooner. Performed at [facility], [place] [address], [place], [place] [address]   Culture, blood (Routine X 2) w Reflex to ID Panel     Status: None   Collection Time: 11/01/19 12:17 AM   Specimen: BLOOD LEFT HAND  Result Value Ref Range Status   Specimen Description BLOOD LEFT HAND  Final   Special Requests   Final    BOTTLES DRAWN AEROBIC AND ANAEROBIC Blood Culture adequate volume   Culture   Final    NO GROWTH 5 DAYS Performed at [facility], [place] [address], [place], [place] [address]    Report Status 11/06/2019 FINAL  Final  Culture, blood (Routine X 2) w Reflex to ID Panel  Status: None   Collection Time: 11/01/19  1:02 AM   Specimen: BLOOD  Result Value Ref Range Status   Specimen Description BLOOD SITE NOT SPECIFIED  Final   Special Requests   Final    BOTTLES DRAWN AEROBIC AND ANAEROBIC Blood Culture adequate volume   Culture   Final    NO GROWTH 5 DAYS Performed at [facility], [address] [place], [place] [address]    Report Status 11/06/2019 FINAL  Final    Radiology Reports DG Chest Port 1 View  Result Date: 11/05/2019 CLINICAL DATA:  Shortness of breath EXAM: PORTABLE CHEST 1 VIEW COMPARISON:  Nov 04, 2019 and Nov 02, 2019 FINDINGS: Central catheter tip is in the superior vena cava. No pneumothorax. Note that there is subcutaneous air in the supraclavicular regions, stable. There is also a degree of pneumomediastinum, less pronounced than on study from 3 days prior. There is airspace opacity bilaterally with consolidation in the right lower lobe. No new opacity evident. Heart size and pulmonary vascularity are normal. No adenopathy. There is degenerative change in thoracic spine. IMPRESSION: Multifocal airspace opacity with consolidation right base, similar to recent studies. Stable cardiac silhouette. Central catheter tip in superior vena cava. No pneumothorax.  However, there is pneumomediastinum as well as subcutaneous air in the supraclavicular regions, more on the right than the left. Electronically Signed   By: Lowella Grip III M.D.   On: 11/05/2019 09:11   DG Chest Port 1 View  Result Date: 11/04/2019 CLINICAL DATA:  PICC line placement EXAM: PORTABLE CHEST 1 VIEW COMPARISON:  11/04/2019 FINDINGS: Right PICC line is been placed. The tip is at the cavoatrial junction. Stable diffuse bilateral airspace disease. No visible effusion or pneumothorax. Subcutaneous emphysema again noted in the right neck base, stable. No acute bony abnormality. IMPRESSION: Right PICC line tip at the cavoatrial junction. Stable bilateral airspace disease. Electronically Signed   By: Rolm Baptise M.D.   On: 11/04/2019 18:46   DG Chest Port 1 View  Result Date: 11/04/2019 CLINICAL DATA:  Short of breath. EXAM: PORTABLE CHEST 1 VIEW COMPARISON:  11/02/2019 FINDINGS: Normal heart size. Extensive, bilateral pulmonary opacities are identified which appear unchanged from previous exam. No pneumothorax. Subcutaneous emphysema is identified within the right neck. Etiology indeterminate. IMPRESSION: 1. No change in aeration a lungs compared with previous exam. 2. Right neck subcu emphysema noted. Electronically Signed   By: Kerby Moors M.D.   On: 11/04/2019 09:33   DG Chest Port 1 View  Result Date: 11/02/2019 CLINICAL DATA:  Shortness of breath, COVID positive EXAM: PORTABLE CHEST 1 VIEW COMPARISON:  11/01/2019 FINDINGS: Diffuse bilateral pulmonary opacities. Worsening left lung aeration. No pneumothorax. Pneumomediastinum is similar. Stable cardiomediastinal contours. No significant pleural effusion. IMPRESSION: Bilateral pneumonia with worsening lung aeration on the left. Similar pneumomediastinum. Electronically Signed   By: Macy Mis M.D.   On: 11/02/2019 07:04   DG Chest Port 1 View  Result Date: 11/01/2019 CLINICAL DATA:  Acute respiratory failure EXAM: PORTABLE CHEST 1  VIEW COMPARISON:  Yesterday FINDINGS: Pneumomediastinum is newly seen along both mediastinal contours. No visible pneumothorax. Extensive bilateral pneumonia. Cardiomegaly and aortic tortuosity. These results will be called to the ordering clinician or representative by the Radiologist Assistant, and communication documented in the PACS or [employer]. IMPRESSION: 1. New pneumomediastinum. 2. Stable bilateral pneumonia.  No visible pneumothorax. Electronically Signed   By: Monte Fantasia M.D.   On: 11/01/2019 07:21   DG CHEST PORT 1 VIEW  Result Date: 10/31/2019 CLINICAL DATA:  Tachypnea EXAM: PORTABLE CHEST 1 VIEW COMPARISON:  10/31/2019, 10/30/2019, 10/28/2019 FINDINGS: Rotated patient. Fairly extensive bilateral ground-glass opacities and consolidations probably without significant change allowing for rotated patient. Enlarged cardiomediastinal silhouette also exaggerated by rotation. No pleural effusion or pneumothorax. IMPRESSION: Likely no significant interval change in fairly extensive bilateral airspace disease and consolidations allowing for patient rotation Electronically Signed   By: Donavan Foil M.D.   On: 10/31/2019 19:56   DG Chest Port 1 View  Result Date: 10/31/2019 CLINICAL DATA:  Shortness of breath. COVID-19 viral pneumonia. EXAM: PORTABLE CHEST 1 VIEW COMPARISON:  02/21/2020 FINDINGS: Heart size remains within normal limits. Bilateral diffuse heterogeneous airspace opacity shows no significant change. No evidence of pneumothorax or pleural effusion. IMPRESSION: No significant change in diffuse heterogeneous airspace disease. Electronically Signed   By: Marlaine Hind M.D.   On: 10/31/2019 08:16   DG Chest Port 1 View  Result Date: 10/30/2019 CLINICAL DATA:  COVID positive. EXAM: PORTABLE CHEST 1 VIEW COMPARISON:  Chest x-ray dated October 28, 2019. FINDINGS: Stable cardiomediastinal silhouette. Normal pulmonary vascularity. Low lung volumes. Patchy interstitial and airspace  opacities throughout both lungs, mildly worsened at the left lung base. No pleural effusion or pneumothorax. No acute osseous abnormality. IMPRESSION: 1. Multifocal pneumonia, mildly worsened at the left lung base. Electronically Signed   By: Titus Dubin M.D.   On: 10/30/2019 08:26   DG Chest Portable 1 View  Result Date: 10/28/2019 CLINICAL DATA:  Dyspnea with exertion. EXAM: PORTABLE CHEST 1 VIEW COMPARISON:  February 28, 2017. FINDINGS: Stable cardiomediastinal silhouette. Central pulmonary vascular congestion is noted. Bilateral lung opacities are noted which may represent edema or possibly multifocal pneumonia. Atherosclerosis of thoracic aorta is noted. No pneumothorax or pleural effusion is noted. Bony thorax is unremarkable. IMPRESSION: Aortic atherosclerosis. Central pulmonary vascular congestion is noted. Bilateral lung opacities are noted which may represent edema or possibly multifocal pneumonia. Aortic Atherosclerosis (ICD10-I70.0). Electronically Signed   By: Marijo Conception M.D.   On: 10/28/2019 15:42   ECHOCARDIOGRAM COMPLETE  Result Date: 10/29/2019    ECHOCARDIOGRAM REPORT   Patient Name:   SHANASIA NIERENBERG Date of Exam: 10/29/2019 Medical Rec #:  [id number]      Height:       61.0 in Accession #:    [id number]     Weight:       270.0 lb Date of Birth:  1946-01-02      BSA:          2.146 m Patient Age:    69 years       BP:           136/73 mmHg Patient Gender: F              HR:           135 bpm. Exam Location:  Inpatient Procedure: 2D Echo, Cardiac Doppler and Color Doppler Indications:    I48.0 Paroxysmal atrial fibrillation  History:        Patient has no prior history of Echocardiogram examinations.                 COVID-19 Positive.  Sonographer:    Jonelle Sidle Dance Referring Phys: [place]  1. Normal LV function; mild LAE.  2. Left ventricular ejection fraction, by estimation, is 55 to 60%. The left ventricle has normal function. The left ventricle has  no regional wall motion abnormalities. Left ventricular diastolic function could not  be evaluated.  3. Right ventricular systolic function is normal. The right ventricular size is normal. Tricuspid regurgitation signal is inadequate for assessing PA pressure.  4. Left atrial size was mildly dilated.  5. The mitral valve is normal in structure. Trivial mitral valve regurgitation. No evidence of mitral stenosis.  6. The aortic valve is tricuspid. Aortic valve regurgitation is not visualized. No aortic stenosis is present.  7. The inferior vena cava is normal in size with greater than 50% respiratory variability, suggesting right atrial pressure of 3 mmHg. FINDINGS  Left Ventricle: Left ventricular ejection fraction, by estimation, is 55 to 60%. The left ventricle has normal function. The left ventricle has no regional wall motion abnormalities. The left ventricular internal cavity size was normal in size. There is  no left ventricular hypertrophy. Left ventricular diastolic function could not be evaluated due to atrial fibrillation. Left ventricular diastolic function could not be evaluated. Right Ventricle: The right ventricular size is normal. Right ventricular systolic function is normal. Tricuspid regurgitation signal is inadequate for assessing PA pressure. Left Atrium: Left atrial size was mildly dilated. Right Atrium: Right atrial size was normal in size. Pericardium: There is no evidence of pericardial effusion. Mitral Valve: The mitral valve is normal in structure. Normal mobility of the mitral valve leaflets. Trivial mitral valve regurgitation. No evidence of mitral valve stenosis. Tricuspid Valve: The tricuspid valve is normal in structure. Tricuspid valve regurgitation is mild . No evidence of tricuspid stenosis. Aortic Valve: The aortic valve is tricuspid. Aortic valve regurgitation is not visualized. No aortic stenosis is present. Pulmonic Valve: The pulmonic valve was not well visualized. Pulmonic valve  regurgitation is trivial. No evidence of pulmonic stenosis. Aorta: The aortic root is normal in size and structure. Venous: The inferior vena cava is normal in size with greater than 50% respiratory variability, suggesting right atrial pressure of 3 mmHg.  Additional Comments: Normal LV function; mild LAE.  LEFT VENTRICLE PLAX 2D LVIDd:         4.00 cm LVIDs:         2.85 cm LV PW:         1.10 cm LV IVS:        1.13 cm LVOT diam:     1.85 cm LV SV:         40 LV SV Index:   19 LVOT Area:     2.69 cm  RIGHT VENTRICLE             IVC RV Basal diam:  2.82 cm     IVC diam: 1.67 cm RV S prime:     12.20 cm/s TAPSE (M-mode): 0.9 cm LEFT ATRIUM           Index       RIGHT ATRIUM           Index LA diam:      3.20 cm 1.49 cm/m  RA Area:     15.80 cm LA Vol (A2C): 74.0 ml 34.49 ml/m RA Volume:   39.80 ml  18.55 ml/m LA Vol (A4C): 61.6 ml 28.71 ml/m  AORTIC VALVE LVOT Vmax:   122.50 cm/s LVOT Vmean:  66.600 cm/s LVOT VTI:    0.150 m  AORTA Ao Root diam: 3.30 cm Ao Asc diam:  3.20 cm MITRAL VALVE MV Area (PHT): 3.62 cm    SHUNTS MV Decel Time: 210 msec    Systemic VTI:  0.15 m MV E velocity: 53.35 cm/s  Systemic Diam: 1.85 cm Aaron Edelman  Crenshaw MD Electronically signed by Kirk Ruths MD Signature Date/Time: 10/29/2019/2:08:19 PM    Final    VAS [place] LOWER EXTREMITY VENOUS (DVT)  Result Date: 11/03/2019  Lower Venous DVTStudy Indications: Swelling.  Risk Factors: COVID 19 positive. Limitations: Body habitus and poor ultrasound/tissue interface. Comparison Study: No prior studies. Performing Technologist: Oliver Hum RVT  Examination Guidelines: A complete evaluation includes B-mode imaging, spectral Doppler, color Doppler, and power Doppler as needed of all accessible portions of each vessel. Bilateral testing is considered an integral part of a complete examination. Limited examinations for reoccurring indications may be performed as noted. The reflux portion of the exam is performed with the patient in reverse  Trendelenburg.  +---------+---------------+---------+-----------+----------+--------------+ RIGHT    CompressibilityPhasicitySpontaneityPropertiesThrombus Aging +---------+---------------+---------+-----------+----------+--------------+ CFV      Full           Yes      Yes                                 +---------+---------------+---------+-----------+----------+--------------+ SFJ      Full                                                        +---------+---------------+---------+-----------+----------+--------------+ FV Prox  Full                                                        +---------+---------------+---------+-----------+----------+--------------+ FV Mid   Full                                                        +---------+---------------+---------+-----------+----------+--------------+ FV DistalFull                                                        +---------+---------------+---------+-----------+----------+--------------+ PFV      Full                                                        +---------+---------------+---------+-----------+----------+--------------+ POP      Full           Yes      Yes                                 +---------+---------------+---------+-----------+----------+--------------+ PTV      Full                                                        +---------+---------------+---------+-----------+----------+--------------+  PERO     Full                                                        +---------+---------------+---------+-----------+----------+--------------+   +---------+---------------+---------+-----------+----------+--------------+ LEFT     CompressibilityPhasicitySpontaneityPropertiesThrombus Aging +---------+---------------+---------+-----------+----------+--------------+ CFV      Full           Yes      Yes                                  +---------+---------------+---------+-----------+----------+--------------+ SFJ      Full                                                        +---------+---------------+---------+-----------+----------+--------------+ FV Prox  Full                                                        +---------+---------------+---------+-----------+----------+--------------+ FV Mid   Full                                                        +---------+---------------+---------+-----------+----------+--------------+ FV DistalFull                                                        +---------+---------------+---------+-----------+----------+--------------+ PFV      Full                                                        +---------+---------------+---------+-----------+----------+--------------+ POP      Full           Yes      Yes                                 +---------+---------------+---------+-----------+----------+--------------+ PTV      Full                                                        +---------+---------------+---------+-----------+----------+--------------+ PERO     Full                                                        +---------+---------------+---------+-----------+----------+--------------+  Summary: RIGHT: - There is no evidence of deep vein thrombosis in the lower extremity. However, portions of this examination were limited- see technologist comments above.  - No cystic structure found in the popliteal fossa.  LEFT: - There is no evidence of deep vein thrombosis in the lower extremity. However, portions of this examination were limited- see technologist comments above.  - No cystic structure found in the popliteal fossa.  *See table(s) above for measurements and observations. Electronically signed by Monica Martinez MD on 11/03/2019 at 4:09:50 PM.    Final    [place] EKG SITE RITE  Result Date: 11/04/2019 If Site Rite image not  attached, placement could not be confirmed due to current cardiac rhythm.    Phillips Climes M.D on 11/07/2019 at 11:07 AM  To page go to [email or web address] - password [facility]

## 2019-11-07 NOTE — Progress Notes (Signed)
   Afib rate control now appears adequate. On oral amiodarone 200 mg BID, diltiazem 90 mg q8 hours. Consider decreasing amiodarone to 200 mg daily closer to discharge when she improves. Would wait until then to consolidate the diltiazem to long-acting daily dose. No further suggestions at this time.  [facility] will sign off.   Medication Recommendations:  As above Other recommendations (labs, testing, etc):  None Follow up as an outpatient:  Dr. Vanita Ingles, MD, [facility], [place] Director of the Advanced Lipid Disorders &  Cardiovascular Risk Reduction Clinic Diplomate of the American Board of Clinical Lipidology Attending Cardiologist  Direct Dial: [phone]  Fax: [phone]  Website:  [email or web address]

## 2019-11-08 LAB — BASIC METABOLIC PANEL
Anion gap: 10 (ref 5–15)
BUN: 17 mg/dL (ref 8–23)
CO2: 32 mmol/L (ref 22–32)
Calcium: 8.5 mg/dL — ABNORMAL LOW (ref 8.9–10.3)
Chloride: 99 mmol/L (ref 98–111)
Creatinine, Ser: 1.23 mg/dL — ABNORMAL HIGH (ref 0.44–1.00)
GFR calc Af Amer: 51 mL/min — ABNORMAL LOW (ref >60)
GFR calc non Af Amer: 44 mL/min — ABNORMAL LOW (ref >60)
Glucose, Bld: 91 mg/dL (ref 70–99)
Potassium: 4 mmol/L (ref 3.5–5.1)
Sodium: 141 mmol/L (ref 135–145)

## 2019-11-08 LAB — CBC
HCT: 41.2 % (ref 36.0–46.0)
Hemoglobin: 12.9 g/dL (ref 12.0–15.0)
MCH: 30.1 pg (ref 26.0–34.0)
MCHC: 31.3 g/dL (ref 30.0–36.0)
MCV: 96 fL (ref 80.0–100.0)
Platelets: 335 10*3/uL (ref 150–400)
RBC: 4.29 MIL/uL (ref 3.87–5.11)
RDW: 13.4 % (ref 11.5–15.5)
WBC: 5.9 10*3/uL (ref 4.0–10.5)
nRBC: 0 % (ref 0.0–0.2)

## 2019-11-08 LAB — GLUCOSE, CAPILLARY
Glucose-Capillary: 90 mg/dL (ref 70–99)
Glucose-Capillary: 127 mg/dL — ABNORMAL HIGH (ref 70–99)
Glucose-Capillary: 125 mg/dL — ABNORMAL HIGH (ref 70–99)
Glucose-Capillary: 104 mg/dL — ABNORMAL HIGH (ref 70–99)

## 2019-11-08 MED ORDER — ENOXAPARIN SODIUM 120 MG/0.8ML ~~LOC~~ SOLN
120.0000 mg | Freq: Two times a day (BID) | SUBCUTANEOUS | Status: DC
  Filled 2019-11-08 (×2): qty 0.8

## 2019-11-08 MED ORDER — SALINE SPRAY 0.65 % NA SOLN
1.0000 | NASAL | Status: DC | PRN
  Administered 2019-11-08 – 2019-11-12 (×2): 1 via NASAL
  Filled 2019-11-08 (×2): qty 44

## 2019-11-08 NOTE — Progress Notes (Signed)
ANTICOAGULATION CONSULT NOTE - Follow Up Consult  Pharmacy Consult for Lovenox Indication: atrial fibrillation  No Known Allergies  Patient Measurements: Height: 5\' 1"  (154.9 cm) Weight: 124 kg (273 lb 5.9 oz) IBW/kg (Calculated) : 47.8 Lovenox Dosing Weight: 124 kg  Vital Signs: Temp: 97.5 F (36.4 C) (05/07 1221) Temp Source: Oral (05/07 1221) BP: 91/74 (05/07 1221) Pulse Rate: 100 (05/07 1221)  Labs: Recent Labs    11/06/19 0546 11/07/19 0258 11/08/19 0204  HGB 14.2  --  12.9  HCT 44.5  --  41.2  PLT 400  --  335  CREATININE 1.29* 1.24* 1.23*    Estimated Creatinine Clearance: 51.1 mL/min (A) (by C-G formula based on SCr of 1.23 mg/dL (H)).  Assessment:  74 yr old female transitioned from IV Heparin to Eliquis on 4/29. D-dimer continued to climb while on Eliquis. MD consulted Pharmacy to change to Full Dose Lovenox therapy on 5/4.   Last Eliquis dose 4/30 pm.     Noted nosebleed today, possibly due to nasal cannula.     D-dimer trended down (16.96>15.08>7.04>3.05>2.24)   Goal of Therapy:  Anti-Xa level 0.6-1 units/ml 4hrs after LMWH dose given Monitor platelets by anticoagulation protocol: Yes   Plan:   Holding Lovenox tonight due to nosebleed.  Lovenox 120 mg SQ q12hrs - to resume in am.  Follow up for any further bleeding.  Follow up for transition back to Eliquis when able.  Arty Baumgartner, [place] Phone: [phone] 11/08/2019,3:13 PM

## 2019-11-08 NOTE — TOC Initial Note (Signed)
Transition of Care [facility]) - Initial/Assessment Note    Patient Details  Name: Julia Mcfarland MRN: [id number] Date of Birth: May 06, 1946  Transition of Care [facility] CM/SW Contact:    Benard Halsted, LCSW Phone Number: 11/08/2019, 11:07 AM  Clinical Narrative:                 CSW left voicemail for patient's grandson (spouse is also hospitalized) to discuss discharge planning and recommendation of SNF placement for rehab.     Barriers to Discharge: Continued Medical Work up   Patient Goals and CMS Choice        Expected Discharge Plan and Services   In-house Referral: Clinical Social Work     Living arrangements for the past 2 months: Single Family Home                                      Prior Living Arrangements/Services Living arrangements for the past 2 months: Single Family Home Lives with:: Spouse Patient language and need for interpreter reviewed:: Yes        Need for Family Participation in Patient Care: Yes (Comment) Care giver support system in place?: Yes (comment)   Criminal Activity/Legal Involvement Pertinent to Current Situation/Hospitalization: No - Comment as needed  Activities of Daily Living      Permission Sought/Granted                  Emotional Assessment       Orientation: : Oriented to Self, Oriented to Place, Oriented to Situation Alcohol / Substance Use: Not Applicable Psych Involvement: No (comment)  Admission diagnosis:  Persistent atrial fibrillation ([place]) [I48.19] Atrial fibrillation with rapid ventricular response ([facility]) [I48.91] Atrial fibrillation with RVR ([place]) [I48.91] Patient Active Problem List   Diagnosis Date Noted  . Atrial fibrillation with rapid ventricular response ([place]) 10/29/2019  . Pneumonia due to COVID-19 virus 10/29/2019  . Acute respiratory failure with hypoxia ([place]) 10/29/2019  . AKI (acute kidney injury) ([employer]) 10/29/2019  . Persistent atrial fibrillation ([place]) 10/28/2019  . Chronic venous  insufficiency 02/18/2019  . Prediabetes 08/21/2014  . Osteoporosis 08/14/2013  . Morbid obesity with BMI of 50.0-59.9, adult ([place]) 08/14/2013   PCP:  [name], NP Pharmacy:   [id number] - [place], [place] [address] [place] [address] Phone: [phone] Fax: [phone]  [facility] [place], [place] [address] Phone: [phone] Fax: [phone]  [facility] [place] [place] - [address] [place] [address] Phone: [phone] Fax: [phone]     Social Determinants of Health (SDOH) Interventions    Readmission Risk Interventions No flowsheet data found.

## 2019-11-08 NOTE — Progress Notes (Signed)
PROGRESS NOTE                                                                                                                                                                                                             [name], is a 74 y.o. female, DOB - 06/09/1946, [id number]  Admit date - 10/28/2019   Admitting Physician Sanda Klein, MD  Outpatient Primary MD for the patient is Garnette Gunner Coralie Keens, NP  LOS - 11  Chief Complaint  Patient presents with  . Tachycardia       Brief Narrative    Julia Mcfarland is a 74 y.o. female with medical history significant of chronic venous insufficiency, arthritis, and morbid obesity presented complaints of palpitations, generalized malaise, and shortness of breath over the last 3 days.  Associated symptoms included chills, generalized weakness, and a nonproductive cough.  Upon admission she was found to be in A. fib with RVR with heart rates into the 170s.     She was admitted by cardiology team on 26 April subsequently found to have COVID-19 pneumonia and hospitalist team was consulted.  She had by that time developed severe parenchymal injury with severe hypoxia, briefly was in ICU for heated high flow now back to progressive care unit with heated high flow, she remains with significant oxygen requirement and heated high flow, as well hospital stay was noted for A. fib with RVR, for which cardiology is consulted.    Subjective:   Patient in bed, appears comfortable, denies any headache, no fever, no chest pain or pressure, reports dyspnea is better, had some epistaxis earlier today.   Assessment  & Plan :      Acute hypoxic respiratory failure due to [id number] pneumonia complicated by pneumomediastinum.   - She was initially treated for the first 2 to 3 days of hospitalization with IV steroids and remdesivir, however she continued to decline, with significant increase of oxygen requirement, which she  required heated high flow nasal cannula , patient did require 30 L heated high flow nasal cannula yesterday, hypoxia has been improving, she has been requiring around 5 L nasal cannula, but this has been changed to Ventimask given significant epistaxis earlier today . -She received Actemra . -Use incentive spirometry and flutter valve . -Overall she remains very tenuous, continue to monitor her progressive care . - No signs of bacterial infection or esophageal rupture.  No chest or abdominal pain.  Gradually improving continue to  monitor. -She did develop pneumomediastinum, she denies any chest pain today.  SpO2: 98 % O2 Flow Rate (L/min): 5 L/min FiO2 (%): 40 %    Recent Labs  Lab 11/02/19 0500 11/02/19 0500 11/03/19 0615 11/03/19 0615 11/04/19 0500 11/04/19 2016 11/05/19 0517 11/06/19 0546 11/07/19 0258 11/08/19 0204  NA 145   < > 144   < >  --  141 139 140 138 141  K 3.4*   < > 3.3*   < >  --  3.3* 2.9* 3.6 3.4* 4.0  CL 102   < > 99   < >  --  98 98 99 98 99  CO2 29   < > 31   < >  --  32 32 32 31 32  GLUCOSE 95   < > 99   < >  --  163* 127* 111* 95 91  BUN 27*   < > 30*   < >  --  24* 24* 20 18 17   CREATININE 1.18*   < > 1.24*   < >  --  1.14* 1.17* 1.29* 1.24* 1.23*  CALCIUM 8.8*   < > 8.5*   < >  --  7.9* 7.9* 8.3* 8.4* 8.5*  AST 30   < > 24  --   --  25 24 31 31   --   ALT 32   < > 26  --   --  23 23 24 28   --   ALKPHOS 63   < > 62  --   --  58 56 55 53  --   BILITOT 1.0   < > 1.1  --   --  1.0 0.8 1.3* 1.1  --   ALBUMIN 2.6*   < > 2.5*  --   --  2.4* 2.4* 2.4* 2.5*  --   MG 2.2   < > 2.3  --   --  2.2 2.1 2.2 2.0  --   CRP 3.3*   < > 1.9*  --   --  0.9 0.8 0.6 0.6  --   DDIMER 16.96*   < > 15.08*  --   --  7.92* 7.04* 3.05* 2.24*  --   PROCALCITON <0.10  --  <0.10  --   --  <0.10 <0.10 <0.10  --   --   BNP 181.6*  --  82.2  --  74.4  --  74.3 45.5  --   --    < > = values in this interval not displayed.    Recent Labs  Lab 11/02/19 0500 11/02/19 0500  11/03/19 0615 11/04/19 0500 11/04/19 2016 11/05/19 0517 11/06/19 0546 11/07/19 0258  CRP 3.3*   < > 1.9*  --  0.9 0.8 0.6 0.6  DDIMER 16.96*   < > 15.08*  --  7.92* 7.04* 3.05* 2.24*  BNP 181.6*  --  82.2 74.4  --  74.3 45.5  --   PROCALCITON <0.10  --  <0.10  --  <0.10 <0.10 <0.10  --    < > = values in this interval not displayed.      Lab Results  Component Value Date   TSH 0.725 10/28/2019    Paroxysmal A. fib RVR.  -  Mali vas 2 score of greater than 3.  Cardiology on board, stable TSH and echocardiogram, initially required IV amiodarone and digoxin, currently transition to oral amiodarone, oral Cardizem dose increased on 11/04/2019 for better control. -Per cardiology recommendation, continue with Cardizem  90 mg every 8 hours, and consulted.  To discharge, as well decrease amiodarone 200 mg twice daily to once daily prior to discharge . -Continue with full anticoagulation dose Lovenox . -She is on midodrine for soft blood pressure while on heart rate controlling agents, I will decrease midodrine to 5 mg p.o. 3 times daily. -Keep potassium> 4, magnesium> 2  Epistaxis -This is most likely due to prolonged nasal cannula use, currently bleeding has stopped, will hold evening dose of Lovenox, meanwhile she has been changed to Ventimask given epistaxis.  Obesity with BMI of 51.   - Follow with PCP for weight loss.   AKI.   - Clinically now appears dehydrated, gentle IV fluids and monitor.   Metabolic encephalopathy.  -  Was due to hypoxia completely resolved after heated high flow, p.m. Seroquel.    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  Hypokalemia.   -Repleted  Rising D-dimer despite being on Eliquis.  This is likely due to inflammation from COVID-19, leg ultrasound is negative, for now full  dose Lovenox till D-dimer starts trending down thereafter switch back to Eliquis.    Hypotension. -Improved with midodrine, will start to taper.  DM type II.  Lantus plus sliding scale monitor and adjust.    Lab Results  Component Value Date   HGBA1C 6.3 (H) 10/28/2019   CBG (last 3)  Recent Labs    11/07/19 2040 11/08/19 0737 11/08/19 1212  GLUCAP 127* 90 127*      Family Communication  :  -Husband is currently hospitalized at [facility] . - D/W grandson 5/7  Code Status :  Full  Disposition Plan  : Patient is not medically stable for discharge, she remains in significant oxygen requirement.  Consults  : Cards  Procedures  :    Leg [place] - No DVT  TTE -  1. Normal LV function; mild LAE.  2. Left ventricular ejection fraction, by estimation, is 55 to 60%. The left ventricle has normal function. The left ventricle has no regional wall motion abnormalities. Left ventricular diastolic function could not be evaluated.  3. Right ventricular systolic function is normal. The right ventricular size is normal. Tricuspid regurgitation signal is inadequate for assessing PA pressure.  4. Left atrial size was mildly dilated.  5. The mitral valve is normal in structure. Trivial mitral valve regurgitation. No evidence of mitral stenosis.  6. The aortic valve is tricuspid. Aortic valve regurgitation is not visualized. No aortic stenosis is present.  7. The inferior vena cava is normal in size with greater than 50% respiratory variability, suggesting right atrial pressure of 3 mmHg.  DVT Prophylaxis  :  Eliquis  Lab Results  Component Value Date   PLT 335 11/08/2019    Diet :  Diet Order            DIET SOFT Room service appropriate? Yes; Fluid consistency: Thin  Diet effective now               Inpatient Medications Scheduled Meds: . amiodarone  200 mg Oral BID  . Chlorhexidine Gluconate Cloth  6 each Topical Daily  . diltiazem  90 mg Oral Q8H  . [START ON 11/09/2019]  enoxaparin (LOVENOX) injection  120 mg Subcutaneous Q12H  . insulin aspart  0-20 Units Subcutaneous TID WC  . insulin  detemir  15 Units Subcutaneous Daily  . midodrine  5 mg Oral TID WC  . pantoprazole  40 mg Oral Daily  . polyethylene glycol  17 g Oral BID  . QUEtiapine  25 mg Oral QHS  . senna-docusate  2 tablet Oral BID   Continuous Infusions:  PRN Meds:.acetaminophen, albuterol, metoprolol tartrate, ondansetron (ZOFRAN) IV, sodium chloride, sodium chloride flush  Antibiotics  :   Anti-infectives (From admission, onward)   Start     Dose/Rate Route Frequency Ordered Stop   10/30/19 1000  remdesivir 100 mg in sodium chloride 0.9 % 100 mL IVPB     100 mg 200 mL/hr over 30 Minutes Intravenous Daily 10/29/19 0914 11/02/19 0956   10/29/19 1000  remdesivir 200 mg in sodium chloride 0.9% 250 mL IVPB     200 mg 580 mL/hr over 30 Minutes Intravenous Once 10/29/19 0914 10/29/19 1236         Objective:   Vitals:   11/08/19 0642 11/08/19 0800 11/08/19 1100 11/08/19 1221  BP:  (!) 86/73  91/74  Pulse: (!) 103 (!) 109 97 100  Resp: 18 20 20 20   Temp:    (!) 97.5 F (36.4 C)  TempSrc:    Oral  SpO2: 96% 96% 98% 98%  Weight:      Height:        SpO2: 98 % O2 Flow Rate (L/min): 5 L/min FiO2 (%): 40 %  Wt Readings from Last 3 Encounters:  11/08/19 124 kg  09/19/19 132 kg  02/18/19 127.9 kg     Intake/Output Summary (Last 24 hours) at 11/08/2019 1433 Last data filed at 11/07/2019 1439 Gross per 24 hour  Intake 120 ml  Output 550 ml  Net -430 ml     Physical Exam  Awake Alert, Oriented X 3, No new F.N deficits, Normal affect Symmetrical Chest wall movement, Good air movement bilaterally, CTAB RRR,No Gallops,Rubs or new Murmurs, No Parasternal Heave +ve B.Sounds, Abd Soft, No tenderness, No rebound - guarding or rigidity. No Cyanosis, Clubbing or edema, No new Rash or bruise       Data Review:    Recent Labs  Lab 11/04/19 0742 11/04/19 2317 11/05/19 0517  11/06/19 0546 11/08/19 0204  WBC 8.6 6.3 7.6 6.2 5.9  HGB 16.7* 14.6 15.0 14.2 12.9  HCT 52.1* 45.6 46.3* 44.5 41.2  PLT 375 396 408* 400 335  MCV 93.5 94.8 94.9 95.5 96.0  MCH 30.0 30.4 30.7 30.5 30.1  MCHC 32.1 32.0 32.4 31.9 31.3  RDW 12.9 13.0 13.0 13.2 13.4    Recent Labs  Lab 11/02/19 0500 11/02/19 0500 11/03/19 0615 11/03/19 0615 11/04/19 0500 11/04/19 2016 11/05/19 0517 11/06/19 0546 11/07/19 0258 11/08/19 0204  NA 145   < > 144   < >  --  141 139 140 138 141  K 3.4*   < > 3.3*   < >  --  3.3* 2.9* 3.6 3.4* 4.0  CL 102   < > 99   < >  --  98 98 99 98 99  CO2 29   < > 31   < >  --  32 32 32 31 32  GLUCOSE 95   < > 99   < >  --  163* 127* 111* 95 91  BUN 27*   < > 30*   < >  --  24* 24* 20 18 17   CREATININE 1.18*   < > 1.24*   < >  --  1.14* 1.17* 1.29* 1.24* 1.23*  CALCIUM 8.8*   < > 8.5*   < >  --  7.9* 7.9* 8.3* 8.4* 8.5*  AST 30   < > 24  --   --  25 24 31 31   --   ALT 32   < > 26  --   --  23 23 24 28   --   ALKPHOS 63   < > 62  --   --  58 56 55 53  --   BILITOT 1.0   < > 1.1  --   --  1.0 0.8 1.3* 1.1  --   ALBUMIN 2.6*   < > 2.5*  --   --  2.4* 2.4* 2.4* 2.5*  --   MG 2.2   < > 2.3  --   --  2.2 2.1 2.2 2.0  --   CRP 3.3*   < > 1.9*  --   --  0.9 0.8 0.6 0.6  --   DDIMER 16.96*   < > 15.08*  --   --  7.92* 7.04* 3.05* 2.24*  --   PROCALCITON <0.10  --  <0.10  --   --  <0.10 <0.10 <0.10  --   --   BNP 181.6*  --  82.2  --  74.4  --  74.3 45.5  --   --    < > = values in this interval not displayed.    Recent Labs  Lab 11/02/19 0500 11/02/19 0500 11/03/19 0615 11/04/19 0500 11/04/19 2016 11/05/19 0517 11/06/19 0546 11/07/19 0258  CRP 3.3*   < > 1.9*  --  0.9 0.8 0.6 0.6  DDIMER 16.96*   < > 15.08*  --  7.92* 7.04* 3.05* 2.24*  BNP 181.6*  --  82.2 74.4  --  74.3 45.5  --   PROCALCITON <0.10  --  <0.10  --  <0.10 <0.10 <0.10  --    < > = values in this interval not displayed.      ------------------------------------------------------------------------------------------------------------------ No results for input(s): CHOL, HDL, LDLCALC, TRIG, CHOLHDL, LDLDIRECT in the last 72 hours.  Lab Results  Component Value Date   HGBA1C 6.3 (H) 10/28/2019   ------------------------------------------------------------------------------------------------------------------ No results for input(s): TSH, T4TOTAL, T3FREE, THYROIDAB in the last 72 hours.  Invalid input(s): FREET3 ------------------------------------------------------------------------------------------------------------------ No results for input(s): VITAMINB12, FOLATE, FERRITIN, TIBC, IRON, RETICCTPCT in the last 72 hours.  Coagulation profile No results for input(s): INR, PROTIME in the last 168 hours.  Recent Labs    11/06/19 0546 11/07/19 0258  DDIMER 3.05* 2.24*    Cardiac Enzymes No results for input(s): CKMB, TROPONINI, MYOGLOBIN in the last 168 hours.  Invalid input(s): CK ------------------------------------------------------------------------------------------------------------------    Component Value Date/Time   BNP 45.5 11/06/2019 0546    Micro Results Recent Results (from the past 240 hour(s))  Culture, blood (Routine X 2) w Reflex to ID Panel     Status: None   Collection Time: 11/01/19 12:17 AM   Specimen: BLOOD LEFT HAND  Result Value Ref Range Status   Specimen Description BLOOD LEFT HAND  Final   Special Requests   Final    BOTTLES DRAWN AEROBIC AND ANAEROBIC Blood Culture adequate volume   Culture   Final    NO GROWTH 5 DAYS Performed at [facility], [address], [place], [place] [address]    Report Status 11/06/2019 FINAL  Final  Culture, blood (Routine X 2) w Reflex to ID Panel  Status: None   Collection Time: 11/01/19  1:02 AM   Specimen: BLOOD  Result Value Ref Range Status   Specimen Description BLOOD SITE NOT SPECIFIED  Final   Special Requests    Final    BOTTLES DRAWN AEROBIC AND ANAEROBIC Blood Culture adequate volume   Culture   Final    NO GROWTH 5 DAYS Performed at [facility], [address] [place], [place] [address]    Report Status 11/06/2019 FINAL  Final    Radiology Reports DG Chest Port 1 View  Result Date: 11/05/2019 CLINICAL DATA:  Shortness of breath EXAM: PORTABLE CHEST 1 VIEW COMPARISON:  Nov 04, 2019 and Nov 02, 2019 FINDINGS: Central catheter tip is in the superior vena cava. No pneumothorax. Note that there is subcutaneous air in the supraclavicular regions, stable. There is also a degree of pneumomediastinum, less pronounced than on study from 3 days prior. There is airspace opacity bilaterally with consolidation in the right lower lobe. No new opacity evident. Heart size and pulmonary vascularity are normal. No adenopathy. There is degenerative change in thoracic spine. IMPRESSION: Multifocal airspace opacity with consolidation right base, similar to recent studies. Stable cardiac silhouette. Central catheter tip in superior vena cava. No pneumothorax. However, there is pneumomediastinum as well as subcutaneous air in the supraclavicular regions, more on the right than the left. Electronically Signed   By: Lowella Grip III M.D.   On: 11/05/2019 09:11   DG Chest Port 1 View  Result Date: 11/04/2019 CLINICAL DATA:  PICC line placement EXAM: PORTABLE CHEST 1 VIEW COMPARISON:  11/04/2019 FINDINGS: Right PICC line is been placed. The tip is at the cavoatrial junction. Stable diffuse bilateral airspace disease. No visible effusion or pneumothorax. Subcutaneous emphysema again noted in the right neck base, stable. No acute bony abnormality. IMPRESSION: Right PICC line tip at the cavoatrial junction. Stable bilateral airspace disease. Electronically Signed   By: Rolm Baptise M.D.   On: 11/04/2019 18:46   DG Chest Port 1 View  Result Date: 11/04/2019 CLINICAL DATA:  Short of breath. EXAM: PORTABLE CHEST 1 VIEW  COMPARISON:  11/02/2019 FINDINGS: Normal heart size. Extensive, bilateral pulmonary opacities are identified which appear unchanged from previous exam. No pneumothorax. Subcutaneous emphysema is identified within the right neck. Etiology indeterminate. IMPRESSION: 1. No change in aeration a lungs compared with previous exam. 2. Right neck subcu emphysema noted. Electronically Signed   By: Kerby Moors M.D.   On: 11/04/2019 09:33   DG Chest Port 1 View  Result Date: 11/02/2019 CLINICAL DATA:  Shortness of breath, COVID positive EXAM: PORTABLE CHEST 1 VIEW COMPARISON:  11/01/2019 FINDINGS: Diffuse bilateral pulmonary opacities. Worsening left lung aeration. No pneumothorax. Pneumomediastinum is similar. Stable cardiomediastinal contours. No significant pleural effusion. IMPRESSION: Bilateral pneumonia with worsening lung aeration on the left. Similar pneumomediastinum. Electronically Signed   By: Macy Mis M.D.   On: 11/02/2019 07:04   DG Chest Port 1 View  Result Date: 11/01/2019 CLINICAL DATA:  Acute respiratory failure EXAM: PORTABLE CHEST 1 VIEW COMPARISON:  Yesterday FINDINGS: Pneumomediastinum is newly seen along both mediastinal contours. No visible pneumothorax. Extensive bilateral pneumonia. Cardiomegaly and aortic tortuosity. These results will be called to the ordering clinician or representative by the Radiologist Assistant, and communication documented in the PACS or [employer]. IMPRESSION: 1. New pneumomediastinum. 2. Stable bilateral pneumonia.  No visible pneumothorax. Electronically Signed   By: Monte Fantasia M.D.   On: 11/01/2019 07:21   DG CHEST PORT 1 VIEW  Result Date: 10/31/2019 CLINICAL DATA:  Tachypnea EXAM: PORTABLE CHEST 1 VIEW COMPARISON:  10/31/2019, 10/30/2019, 10/28/2019 FINDINGS: Rotated patient. Fairly extensive bilateral ground-glass opacities and consolidations probably without significant change allowing for rotated patient. Enlarged cardiomediastinal  silhouette also exaggerated by rotation. No pleural effusion or pneumothorax. IMPRESSION: Likely no significant interval change in fairly extensive bilateral airspace disease and consolidations allowing for patient rotation Electronically Signed   By: Donavan Foil M.D.   On: 10/31/2019 19:56   DG Chest Port 1 View  Result Date: 10/31/2019 CLINICAL DATA:  Shortness of breath. COVID-19 viral pneumonia. EXAM: PORTABLE CHEST 1 VIEW COMPARISON:  02/21/2020 FINDINGS: Heart size remains within normal limits. Bilateral diffuse heterogeneous airspace opacity shows no significant change. No evidence of pneumothorax or pleural effusion. IMPRESSION: No significant change in diffuse heterogeneous airspace disease. Electronically Signed   By: Marlaine Hind M.D.   On: 10/31/2019 08:16   DG Chest Port 1 View  Result Date: 10/30/2019 CLINICAL DATA:  COVID positive. EXAM: PORTABLE CHEST 1 VIEW COMPARISON:  Chest x-ray dated October 28, 2019. FINDINGS: Stable cardiomediastinal silhouette. Normal pulmonary vascularity. Low lung volumes. Patchy interstitial and airspace opacities throughout both lungs, mildly worsened at the left lung base. No pleural effusion or pneumothorax. No acute osseous abnormality. IMPRESSION: 1. Multifocal pneumonia, mildly worsened at the left lung base. Electronically Signed   By: Titus Dubin M.D.   On: 10/30/2019 08:26   DG Chest Portable 1 View  Result Date: 10/28/2019 CLINICAL DATA:  Dyspnea with exertion. EXAM: PORTABLE CHEST 1 VIEW COMPARISON:  February 28, 2017. FINDINGS: Stable cardiomediastinal silhouette. Central pulmonary vascular congestion is noted. Bilateral lung opacities are noted which may represent edema or possibly multifocal pneumonia. Atherosclerosis of thoracic aorta is noted. No pneumothorax or pleural effusion is noted. Bony thorax is unremarkable. IMPRESSION: Aortic atherosclerosis. Central pulmonary vascular congestion is noted. Bilateral lung opacities are noted which may  represent edema or possibly multifocal pneumonia. Aortic Atherosclerosis (ICD10-I70.0). Electronically Signed   By: Marijo Conception M.D.   On: 10/28/2019 15:42   ECHOCARDIOGRAM COMPLETE  Result Date: 10/29/2019    ECHOCARDIOGRAM REPORT   Patient Name:   JEANEANE BARNIER Date of Exam: 10/29/2019 Medical Rec #:  [id number]      Height:       61.0 in Accession #:    [id number]     Weight:       270.0 lb Date of Birth:  03-23-1946      BSA:          2.146 m Patient Age:    17 years       BP:           136/73 mmHg Patient Gender: F              HR:           135 bpm. Exam Location:  Inpatient Procedure: 2D Echo, Cardiac Doppler and Color Doppler Indications:    I48.0 Paroxysmal atrial fibrillation  History:        Patient has no prior history of Echocardiogram examinations.                 COVID-19 Positive.  Sonographer:    Jonelle Sidle Dance Referring Phys: Cecilia  1. Normal LV function; mild LAE.  2. Left ventricular ejection fraction, by estimation, is 55 to 60%. The left ventricle has normal function. The left ventricle has no regional wall motion abnormalities. Left ventricular diastolic function could not  be evaluated.  3. Right ventricular systolic function is normal. The right ventricular size is normal. Tricuspid regurgitation signal is inadequate for assessing PA pressure.  4. Left atrial size was mildly dilated.  5. The mitral valve is normal in structure. Trivial mitral valve regurgitation. No evidence of mitral stenosis.  6. The aortic valve is tricuspid. Aortic valve regurgitation is not visualized. No aortic stenosis is present.  7. The inferior vena cava is normal in size with greater than 50% respiratory variability, suggesting right atrial pressure of 3 mmHg. FINDINGS  Left Ventricle: Left ventricular ejection fraction, by estimation, is 55 to 60%. The left ventricle has normal function. The left ventricle has no regional wall motion abnormalities. The left ventricular  internal cavity size was normal in size. There is  no left ventricular hypertrophy. Left ventricular diastolic function could not be evaluated due to atrial fibrillation. Left ventricular diastolic function could not be evaluated. Right Ventricle: The right ventricular size is normal. Right ventricular systolic function is normal. Tricuspid regurgitation signal is inadequate for assessing PA pressure. Left Atrium: Left atrial size was mildly dilated. Right Atrium: Right atrial size was normal in size. Pericardium: There is no evidence of pericardial effusion. Mitral Valve: The mitral valve is normal in structure. Normal mobility of the mitral valve leaflets. Trivial mitral valve regurgitation. No evidence of mitral valve stenosis. Tricuspid Valve: The tricuspid valve is normal in structure. Tricuspid valve regurgitation is mild . No evidence of tricuspid stenosis. Aortic Valve: The aortic valve is tricuspid. Aortic valve regurgitation is not visualized. No aortic stenosis is present. Pulmonic Valve: The pulmonic valve was not well visualized. Pulmonic valve regurgitation is trivial. No evidence of pulmonic stenosis. Aorta: The aortic root is normal in size and structure. Venous: The inferior vena cava is normal in size with greater than 50% respiratory variability, suggesting right atrial pressure of 3 mmHg.  Additional Comments: Normal LV function; mild LAE.  LEFT VENTRICLE PLAX 2D LVIDd:         4.00 cm LVIDs:         2.85 cm LV PW:         1.10 cm LV IVS:        1.13 cm LVOT diam:     1.85 cm LV SV:         40 LV SV Index:   19 LVOT Area:     2.69 cm  RIGHT VENTRICLE             IVC RV Basal diam:  2.82 cm     IVC diam: 1.67 cm RV S prime:     12.20 cm/s TAPSE (M-mode): 0.9 cm LEFT ATRIUM           Index       RIGHT ATRIUM           Index LA diam:      3.20 cm 1.49 cm/m  RA Area:     15.80 cm LA Vol (A2C): 74.0 ml 34.49 ml/m RA Volume:   39.80 ml  18.55 ml/m LA Vol (A4C): 61.6 ml 28.71 ml/m  AORTIC VALVE  LVOT Vmax:   122.50 cm/s LVOT Vmean:  66.600 cm/s LVOT VTI:    0.150 m  AORTA Ao Root diam: 3.30 cm Ao Asc diam:  3.20 cm MITRAL VALVE MV Area (PHT): 3.62 cm    SHUNTS MV Decel Time: 210 msec    Systemic VTI:  0.15 m MV E velocity: 53.35 cm/s  Systemic Diam: 1.85 cm  Kirk Ruths MD Electronically signed by Kirk Ruths MD Signature Date/Time: 10/29/2019/2:08:19 PM    Final    VAS [place] LOWER EXTREMITY VENOUS (DVT)  Result Date: 11/03/2019  Lower Venous DVTStudy Indications: Swelling.  Risk Factors: COVID 19 positive. Limitations: Body habitus and poor ultrasound/tissue interface. Comparison Study: No prior studies. Performing Technologist: Oliver Hum RVT  Examination Guidelines: A complete evaluation includes B-mode imaging, spectral Doppler, color Doppler, and power Doppler as needed of all accessible portions of each vessel. Bilateral testing is considered an integral part of a complete examination. Limited examinations for reoccurring indications may be performed as noted. The reflux portion of the exam is performed with the patient in reverse Trendelenburg.  +---------+---------------+---------+-----------+----------+--------------+ RIGHT    CompressibilityPhasicitySpontaneityPropertiesThrombus Aging +---------+---------------+---------+-----------+----------+--------------+ CFV      Full           Yes      Yes                                 +---------+---------------+---------+-----------+----------+--------------+ SFJ      Full                                                        +---------+---------------+---------+-----------+----------+--------------+ FV Prox  Full                                                        +---------+---------------+---------+-----------+----------+--------------+ FV Mid   Full                                                        +---------+---------------+---------+-----------+----------+--------------+ FV DistalFull                                                         +---------+---------------+---------+-----------+----------+--------------+ PFV      Full                                                        +---------+---------------+---------+-----------+----------+--------------+ POP      Full           Yes      Yes                                 +---------+---------------+---------+-----------+----------+--------------+ PTV      Full                                                        +---------+---------------+---------+-----------+----------+--------------+  PERO     Full                                                        +---------+---------------+---------+-----------+----------+--------------+   +---------+---------------+---------+-----------+----------+--------------+ LEFT     CompressibilityPhasicitySpontaneityPropertiesThrombus Aging +---------+---------------+---------+-----------+----------+--------------+ CFV      Full           Yes      Yes                                 +---------+---------------+---------+-----------+----------+--------------+ SFJ      Full                                                        +---------+---------------+---------+-----------+----------+--------------+ FV Prox  Full                                                        +---------+---------------+---------+-----------+----------+--------------+ FV Mid   Full                                                        +---------+---------------+---------+-----------+----------+--------------+ FV DistalFull                                                        +---------+---------------+---------+-----------+----------+--------------+ PFV      Full                                                        +---------+---------------+---------+-----------+----------+--------------+ POP      Full           Yes      Yes                                  +---------+---------------+---------+-----------+----------+--------------+ PTV      Full                                                        +---------+---------------+---------+-----------+----------+--------------+ PERO     Full                                                        +---------+---------------+---------+-----------+----------+--------------+  Summary: RIGHT: - There is no evidence of deep vein thrombosis in the lower extremity. However, portions of this examination were limited- see technologist comments above.  - No cystic structure found in the popliteal fossa.  LEFT: - There is no evidence of deep vein thrombosis in the lower extremity. However, portions of this examination were limited- see technologist comments above.  - No cystic structure found in the popliteal fossa.  *See table(s) above for measurements and observations. Electronically signed by Monica Martinez MD on 11/03/2019 at 4:09:50 PM.    Final    [place] EKG SITE RITE  Result Date: 11/04/2019 If Site Rite image not attached, placement could not be confirmed due to current cardiac rhythm.    Phillips Climes M.D on 11/08/2019 at 2:33 PM  To page go to [email or web address] - password [facility]

## 2019-11-08 NOTE — TOC Initial Note (Signed)
Transition of Care [facility]) - Initial/Assessment Note    Patient Details  Name: Julia Mcfarland MRN: [id number] Date of Birth: Jan 09, 1946  [facility] CM/SW Contact:    Julia Halsted, LCSW Phone Number: 11/08/2019, 11:19 AM  Clinical Narrative:                 Patient's grandson contacted CSW back and confirmed that patient's spouse has been transferred to [facility] as well and he is currently on [place]. CSW spoke with patient's grandson regarding PT recommendation of SNF placement at time of discharge. Patient's grandson expressed understanding of PT recommendation and is agreeable to SNF placement at time of discharge if patient requires it and is in agreement with it. CSW will continue to follow patient's mobility progress. No further questions reported at this time. CSW to continue to follow and assist with discharge planning needs.     Barriers to Discharge: Continued Medical Work up   Patient Goals and CMS Choice Patient states their goals for this hospitalization and ongoing recovery are:: Get stronger CMS Medicare.gov Compare Post Acute Care list provided to:: Patient Represenative (must comment)(Grandson, Julia Mcfarland) Choice offered to / list presented to : Yolanda Bonine)  Expected Discharge Plan and Services   In-house Referral: Clinical Social Work     Living arrangements for the past 2 months: Single Family Home                                      Prior Living Arrangements/Services Living arrangements for the past 2 months: Single Family Home Lives with:: Spouse Patient language and need for interpreter reviewed:: Yes Do you feel safe going back to the place where you live?: Yes      Need for Family Participation in Patient Care: Yes (Comment) Care giver support system in place?: Yes (comment)   Criminal Activity/Legal Involvement Pertinent to Current Situation/Hospitalization: No - Comment as needed  Activities of Daily Living      Permission  Sought/Granted Permission sought to share information with : Family Supports Permission granted to share information with : Yes, Verbal Permission Granted  Share Information with NAME: Julia Mcfarland  Permission granted to share info w AGENCY: SNFs  Permission granted to share info w Relationship: Grandson  Permission granted to share info w Contact Information: [phone]  Emotional Assessment       Orientation: : Oriented to Self, Oriented to Place, Oriented to Situation Alcohol / Substance Use: Not Applicable Psych Involvement: No (comment)  Admission diagnosis:  Persistent atrial fibrillation ([place]) [I48.19] Atrial fibrillation with rapid ventricular response ([place]) [I48.91] Atrial fibrillation with RVR ([place]) [I48.91] Patient Active Problem List   Diagnosis Date Noted  . Atrial fibrillation with rapid ventricular response ([place]) 10/29/2019  . Pneumonia due to COVID-19 virus 10/29/2019  . Acute respiratory failure with hypoxia ([place]) 10/29/2019  . AKI (acute kidney injury) ([place]) 10/29/2019  . Persistent atrial fibrillation ([place]) 10/28/2019  . Chronic venous insufficiency 02/18/2019  . Prediabetes 08/21/2014  . Osteoporosis 08/14/2013  . Morbid obesity with BMI of 50.0-59.9, adult ([place]) 08/14/2013   PCP:  [name], NP Pharmacy:   [id number] - [place], [address] [place] [address] Phone: [phone] Fax: [phone]  [facility] - [place], [address] [address] Phone: [phone] Fax:  [place], [place] - [address] [place] [address] Phone: [phone] Fax: [phone]     Social Determinants of Health ([place]) Interventions    Readmission Risk Interventions Readmission Risk Prevention Plan 11/08/2019  Transportation Screening Complete  PCP or  Specialist Appt within 5-7 Days Complete  Home Care Screening Complete  Medication Review (RN CM) Complete  Some recent data might be hidden

## 2019-11-09 DIAGNOSIS — R040 Epistaxis: Secondary | ICD-10-CM

## 2019-11-09 LAB — CBC
HCT: 37.3 % (ref 36.0–46.0)
Hemoglobin: 11.6 g/dL — ABNORMAL LOW (ref 12.0–15.0)
MCH: 30 pg (ref 26.0–34.0)
MCHC: 31.1 g/dL (ref 30.0–36.0)
MCV: 96.4 fL (ref 80.0–100.0)
Platelets: 279 10*3/uL (ref 150–400)
RBC: 3.87 MIL/uL (ref 3.87–5.11)
RDW: 13.5 % (ref 11.5–15.5)
WBC: 7.9 10*3/uL (ref 4.0–10.5)
nRBC: 0 % (ref 0.0–0.2)

## 2019-11-09 LAB — BASIC METABOLIC PANEL
Anion gap: 9 (ref 5–15)
BUN: 14 mg/dL (ref 8–23)
CO2: 32 mmol/L (ref 22–32)
Calcium: 8.6 mg/dL — ABNORMAL LOW (ref 8.9–10.3)
Chloride: 100 mmol/L (ref 98–111)
Creatinine, Ser: 1.14 mg/dL — ABNORMAL HIGH (ref 0.44–1.00)
GFR calc Af Amer: 56 mL/min — ABNORMAL LOW (ref >60)
GFR calc non Af Amer: 48 mL/min — ABNORMAL LOW (ref >60)
Glucose, Bld: 89 mg/dL (ref 70–99)
Potassium: 3.6 mmol/L (ref 3.5–5.1)
Sodium: 141 mmol/L (ref 135–145)

## 2019-11-09 LAB — GLUCOSE, CAPILLARY
Glucose-Capillary: 115 mg/dL — ABNORMAL HIGH (ref 70–99)
Glucose-Capillary: 121 mg/dL — ABNORMAL HIGH (ref 70–99)
Glucose-Capillary: 140 mg/dL — ABNORMAL HIGH (ref 70–99)

## 2019-11-09 MED ORDER — DILTIAZEM HCL 60 MG PO TABS
30.0000 mg | ORAL_TABLET | Freq: Three times a day (TID) | ORAL | Status: DC
  Administered 2019-11-09 – 2019-11-10 (×3): 30 mg via ORAL
  Filled 2019-11-09 (×3): qty 1

## 2019-11-09 MED ORDER — BACITRACIN-NEOMYCIN-POLYMYXIN 400-5-5000 EX OINT
TOPICAL_OINTMENT | Freq: Every day | CUTANEOUS | Status: DC
  Filled 2019-11-09: qty 1

## 2019-11-09 MED ORDER — DOXYCYCLINE HYCLATE 100 MG PO TABS
100.0000 mg | ORAL_TABLET | Freq: Two times a day (BID) | ORAL | Status: DC
  Administered 2019-11-09 – 2019-11-12 (×7): 100 mg via ORAL
  Filled 2019-11-09 (×7): qty 1

## 2019-11-09 NOTE — Progress Notes (Addendum)
PROGRESS NOTE                                                                                                                                                                                                             [name], is a 74 y.o. female, DOB - 11/30/1945, [id number]  Admit date - 10/28/2019   Admitting Physician Sanda Klein, MD  Outpatient Primary MD for the patient is [name], NP  LOS - 12  Chief Complaint  Patient presents with   Tachycardia       Brief Narrative    Julia Mcfarland is a 74 y.o. female with medical history significant of chronic venous insufficiency, arthritis, and morbid obesity presented complaints of palpitations, generalized malaise, and shortness of breath over the last 3 days.  Associated symptoms included chills, generalized weakness, and a nonproductive cough.  Upon admission she was found to be in A. fib with RVR with heart rates into the 170s.     She was admitted by cardiology team on 26 April subsequently found to have COVID-19 pneumonia and hospitalist team was consulted.  She had by that time developed severe parenchymal injury with severe hypoxia, briefly was in ICU for heated high flow now back to progressive care unit with heated high flow, she remains with significant oxygen requirement and heated high flow, as well hospital stay was noted for A. fib with RVR, for which cardiology is consulted.    Subjective:   Patient in bed, he did have recurrent epistaxis overnight, but otherwise reports her breathing has improved.   Assessment  & Plan :      Acute hypoxic respiratory failure due to [id number] pneumonia complicated by pneumomediastinum.   - She was initially treated for the first 2 to 3 days of hospitalization with IV steroids and remdesivir, however she continued to decline, with significant increase of oxygen requirement, which she required heated high flow nasal cannula ,  patient did require 30 L heated high flow nasal cannula yesterday, her hypoxia was slowly improved, she is currently on 5 L nasal cannula . -She received Actemra . -Use incentive spirometry and flutter valve . -Overall she remains very tenuous, continue to monitor her progressive care . - No signs of bacterial infection or esophageal rupture.  No chest or abdominal pain.  Gradually improving continue to monitor. -She did develop pneumomediastinum, she denies any chest pain today.  SpO2: 97 % O2 Flow Rate (L/min): 5  L/min FiO2 (%): 40 %    Recent Labs  Lab 11/03/19 0615 11/03/19 0615 11/04/19 0500 11/04/19 2016 11/04/19 2016 11/05/19 0517 11/06/19 0546 11/07/19 0258 11/08/19 0204 11/09/19 0340  NA 144   < >  --  141   < > 139 140 138 141 141  K 3.3*   < >  --  3.3*   < > 2.9* 3.6 3.4* 4.0 3.6  CL 99   < >  --  98   < > 98 99 98 99 100  CO2 31   < >  --  32   < > 32 32 31 32 32  GLUCOSE 99   < >  --  163*   < > 127* 111* 95 91 89  BUN 30*   < >  --  24*   < > 24* 20 18 17 14   CREATININE 1.24*   < >  --  1.14*   < > 1.17* 1.29* 1.24* 1.23* 1.14*  CALCIUM 8.5*   < >  --  7.9*   < > 7.9* 8.3* 8.4* 8.5* 8.6*  AST 24  --   --  25  --  24 31 31   --   --   ALT 26  --   --  23  --  23 24 28   --   --   ALKPHOS 62  --   --  58  --  56 55 53  --   --   BILITOT 1.1  --   --  1.0  --  0.8 1.3* 1.1  --   --   ALBUMIN 2.5*  --   --  2.4*  --  2.4* 2.4* 2.5*  --   --   MG 2.3  --   --  2.2  --  2.1 2.2 2.0  --   --   CRP 1.9*  --   --  0.9  --  0.8 0.6 0.6  --   --   DDIMER 15.08*  --   --  7.92*  --  7.04* 3.05* 2.24*  --   --   PROCALCITON <0.10  --   --  <0.10  --  <0.10 <0.10  --   --   --   BNP 82.2  --  74.4  --   --  74.3 45.5  --   --   --    < > = values in this interval not displayed.    Recent Labs  Lab 11/03/19 0615 11/04/19 0500 11/04/19 2016 11/05/19 0517 11/06/19 0546 11/07/19 0258  CRP 1.9*  --  0.9 0.8 0.6 0.6  DDIMER 15.08*  --  7.92* 7.04* 3.05* 2.24*  BNP  82.2 74.4  --  74.3 45.5  --   PROCALCITON <0.10  --  <0.10 <0.10 <0.10  --       Lab Results  Component Value Date   TSH 0.725 10/28/2019    Paroxysmal A. fib RVR.  -  Mali vas 2 score of greater than 3.  Cardiology on board, stable TSH and echocardiogram, initially required IV amiodarone and digoxin, currently transition to oral amiodarone, oral Cardizem dose increased on 11/04/2019 for better control. -Per cardiology recommendation, continue with Cardizem 90 mg every 8 hours, and consulted.  To discharge, as well decrease amiodarone 200 mg twice daily to once daily prior to discharge . -She did convert to normal sinus rhythm today, so we will go ahead decrease her Cardizem  to 30 mg every 8 hour especially with soft blood pressure. -Continue with full anticoagulation dose Lovenox (currently on hold given significant epistaxis). -She is on midodrine for soft blood pressure while on heart rate controlling agents, have decreased to 5 mg oral 3 times daily. -Keep potassium> 4, magnesium> 2  Epistaxis -This is most likely due to prolonged high flow nasal cannula use, being on full anticoagulation, it remains significant, for now I will hold her full dose Lovenox, and continues to have intermittent epistaxis, then will need nasal packing with Rhino Rocket . Addendum 4:15 PM: Patient had Rhino Rocket inserted on both right and left nostrils, and was secured by tape on the nose and forehead area, she will be started on doxycycline.  Obesity with BMI of 51.   - Follow with PCP for weight loss.   AKI.   - Clinically now appears dehydrated, gentle IV fluids and monitor.   Metabolic encephalopathy.  -  Was due to hypoxia completely resolved after heated high flow, p.m. Seroquel.    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  Hypokalemia.   -Repleted  Rising D-dimer despite being on Eliquis.  This is likely due to inflammation from COVID-19, leg ultrasound is negative, for now full dose Lovenox till D-dimer starts trending down thereafter switch back to Eliquis.    Hypotension. -Improved with midodrine, will start to taper.  DM type II.  Lantus plus sliding scale monitor and adjust.    Lab Results  Component Value Date   HGBA1C 6.3 (H) 10/28/2019   CBG (last 3)  Recent Labs    11/08/19 1626 11/08/19 2133 11/09/19 1135  GLUCAP 125* 104* 115*      Family Communication  :  -Husband is currently hospitalized at [facility] . - D/W son 5/8  Code Status :  Full  Disposition Plan  : Patient is not medically stable for discharge, she remains in significant oxygen requirement.  Consults  : Cards  Procedures  :    Leg [place] - No DVT  TTE -  1. Normal LV function; mild LAE.  2. Left ventricular ejection fraction, by estimation, is 55 to 60%. The left ventricle has normal function. The left ventricle has no regional wall motion abnormalities. Left ventricular diastolic function could not be evaluated.  3. Right ventricular systolic function is normal. The right ventricular size is normal. Tricuspid regurgitation signal is inadequate for assessing PA pressure.  4. Left atrial size was mildly dilated.  5. The mitral valve is normal in structure. Trivial mitral valve regurgitation. No evidence of mitral stenosis.  6. The aortic valve is tricuspid. Aortic valve regurgitation is not visualized. No aortic stenosis is present.  7. The inferior vena cava is normal in size with greater than 50% respiratory variability, suggesting right atrial pressure of 3 mmHg.  DVT Prophylaxis  :  Eliquis  Lab Results  Component Value Date   PLT 279 11/09/2019    Diet :  Diet Order            DIET SOFT Room service appropriate? Yes; Fluid consistency: Thin  Diet effective now               Inpatient  Medications Scheduled Meds:  amiodarone  200 mg Oral BID   Chlorhexidine Gluconate Cloth  6 each Topical Daily  diltiazem  30 mg Oral Q8H   enoxaparin (LOVENOX) injection  120 mg Subcutaneous Q12H   insulin aspart  0-20 Units Subcutaneous TID WC   insulin detemir  15 Units Subcutaneous Daily   midodrine  5 mg Oral TID WC   neomycin-bacitracin-polymyxin   Topical Daily   pantoprazole  40 mg Oral Daily   polyethylene glycol  17 g Oral BID   QUEtiapine  25 mg Oral QHS   senna-docusate  2 tablet Oral BID   Continuous Infusions:  PRN Meds:.acetaminophen, albuterol, metoprolol tartrate, ondansetron (ZOFRAN) IV, sodium chloride, sodium chloride flush  Antibiotics  :   Anti-infectives (From admission, onward)   Start     Dose/Rate Route Frequency Ordered Stop   10/30/19 1000  remdesivir 100 mg in sodium chloride 0.9 % 100 mL IVPB     100 mg 200 mL/hr over 30 Minutes Intravenous Daily 10/29/19 0914 11/02/19 0956   10/29/19 1000  remdesivir 200 mg in sodium chloride 0.9% 250 mL IVPB     200 mg 580 mL/hr over 30 Minutes Intravenous Once 10/29/19 0914 10/29/19 1236         Objective:   Vitals:   11/09/19 0400 11/09/19 0533 11/09/19 0800 11/09/19 1200  BP: (!) 99/53 (!) 104/59 98/61 (!) 93/58  Pulse: 68 78 75 76  Resp: 18 16 15 19   Temp: 98.1 F (36.7 C)     TempSrc: Oral     SpO2: 99% 95% 93% 97%  Weight: 124.8 kg     Height:        SpO2: 97 % O2 Flow Rate (L/min): 5 L/min FiO2 (%): 40 %  Wt Readings from Last 3 Encounters:  11/09/19 124.8 kg  09/19/19 132 kg  02/18/19 127.9 kg     Intake/Output Summary (Last 24 hours) at 11/09/2019 1427 Last data filed at 11/09/2019 0545 Gross per 24 hour  Intake 210 ml  Output --  Net 210 ml     Physical Exam  Awake Alert, Oriented X 3, No new F.N deficits, Normal affect Symmetrical Chest wall movement, Good air movement bilaterally, no wheezing RRR,No Gallops,Rubs or new Murmurs, No Parasternal Heave +ve  B.Sounds, Abd Soft, No tenderness, No rebound - guarding or rigidity. No Cyanosis, Clubbing or edema, No new Rash or bruise         Data Review:    Recent Labs  Lab 11/04/19 2317 11/05/19 0517 11/06/19 0546 11/08/19 0204 11/09/19 0340  WBC 6.3 7.6 6.2 5.9 7.9  HGB 14.6 15.0 14.2 12.9 11.6*  HCT 45.6 46.3* 44.5 41.2 37.3  PLT 396 408* 400 335 279  MCV 94.8 94.9 95.5 96.0 96.4  MCH 30.4 30.7 30.5 30.1 30.0  MCHC 32.0 32.4 31.9 31.3 31.1  RDW 13.0 13.0 13.2 13.4 13.5    Recent Labs  Lab 11/03/19 0615 11/03/19 0615 11/04/19 0500 11/04/19 2016 11/04/19 2016 11/05/19 0517 11/06/19 0546 11/07/19 0258 11/08/19 0204 11/09/19 0340  NA 144   < >  --  141   < > 139 140 138 141 141  K 3.3*   < >  --  3.3*   < > 2.9* 3.6 3.4* 4.0 3.6  CL 99   < >  --  98   < > 98 99 98 99 100  CO2 31   < >  --  32   < > 32 32 31 32 32  GLUCOSE 99   < >  --  163*   < > 127* 111*  95 91 89  BUN 30*   < >  --  24*   < > 24* 20 18 17 14   CREATININE 1.24*   < >  --  1.14*   < > 1.17* 1.29* 1.24* 1.23* 1.14*  CALCIUM 8.5*   < >  --  7.9*   < > 7.9* 8.3* 8.4* 8.5* 8.6*  AST 24  --   --  25  --  24 31 31   --   --   ALT 26  --   --  23  --  23 24 28   --   --   ALKPHOS 62  --   --  58  --  56 55 53  --   --   BILITOT 1.1  --   --  1.0  --  0.8 1.3* 1.1  --   --   ALBUMIN 2.5*  --   --  2.4*  --  2.4* 2.4* 2.5*  --   --   MG 2.3  --   --  2.2  --  2.1 2.2 2.0  --   --   CRP 1.9*  --   --  0.9  --  0.8 0.6 0.6  --   --   DDIMER 15.08*  --   --  7.92*  --  7.04* 3.05* 2.24*  --   --   PROCALCITON <0.10  --   --  <0.10  --  <0.10 <0.10  --   --   --   BNP 82.2  --  74.4  --   --  74.3 45.5  --   --   --    < > = values in this interval not displayed.    Recent Labs  Lab 11/03/19 0615 11/04/19 0500 11/04/19 2016 11/05/19 0517 11/06/19 0546 11/07/19 0258  CRP 1.9*  --  0.9 0.8 0.6 0.6  DDIMER 15.08*  --  7.92* 7.04* 3.05* 2.24*  BNP 82.2 74.4  --  74.3 45.5  --   PROCALCITON <0.10  --  <0.10  <0.10 <0.10  --     ------------------------------------------------------------------------------------------------------------------ No results for input(s): CHOL, HDL, LDLCALC, TRIG, CHOLHDL, LDLDIRECT in the last 72 hours.  Lab Results  Component Value Date   HGBA1C 6.3 (H) 10/28/2019   ------------------------------------------------------------------------------------------------------------------ No results for input(s): TSH, T4TOTAL, T3FREE, THYROIDAB in the last 72 hours.  Invalid input(s): FREET3 ------------------------------------------------------------------------------------------------------------------ No results for input(s): VITAMINB12, FOLATE, FERRITIN, TIBC, IRON, RETICCTPCT in the last 72 hours.  Coagulation profile No results for input(s): INR, PROTIME in the last 168 hours.  Recent Labs    11/07/19 0258  DDIMER 2.24*    Cardiac Enzymes No results for input(s): CKMB, TROPONINI, MYOGLOBIN in the last 168 hours.  Invalid input(s): CK ------------------------------------------------------------------------------------------------------------------    Component Value Date/Time   BNP 45.5 11/06/2019 0546    Micro Results Recent Results (from the past 240 hour(s))  Culture, blood (Routine X 2) w Reflex to ID Panel     Status: None   Collection Time: 11/01/19 12:17 AM   Specimen: BLOOD LEFT HAND  Result Value Ref Range Status   Specimen Description BLOOD LEFT HAND  Final   Special Requests   Final    BOTTLES DRAWN AEROBIC AND ANAEROBIC Blood Culture adequate volume   Culture   Final    NO GROWTH 5 DAYS Performed at [facility], [address] [place], [place] [address]    Report Status 11/06/2019 FINAL  Final  Culture, blood (Routine X 2) w Reflex to ID Panel     Status: None   Collection Time: 11/01/19  1:02 AM   Specimen: BLOOD  Result Value Ref Range Status   Specimen Description BLOOD SITE NOT SPECIFIED  Final   Special Requests    Final    BOTTLES DRAWN AEROBIC AND ANAEROBIC Blood Culture adequate volume   Culture   Final    NO GROWTH 5 DAYS Performed at [facility], [address], [place], [place] [address]    Report Status 11/06/2019 FINAL  Final    Radiology Reports DG Chest Port 1 View  Result Date: 11/05/2019 CLINICAL DATA:  Shortness of breath EXAM: PORTABLE CHEST 1 VIEW COMPARISON:  Nov 04, 2019 and Nov 02, 2019 FINDINGS: Central catheter tip is in the superior vena cava. No pneumothorax. Note that there is subcutaneous air in the supraclavicular regions, stable. There is also a degree of pneumomediastinum, less pronounced than on study from 3 days prior. There is airspace opacity bilaterally with consolidation in the right lower lobe. No new opacity evident. Heart size and pulmonary vascularity are normal. No adenopathy. There is degenerative change in thoracic spine. IMPRESSION: Multifocal airspace opacity with consolidation right base, similar to recent studies. Stable cardiac silhouette. Central catheter tip in superior vena cava. No pneumothorax. However, there is pneumomediastinum as well as subcutaneous air in the supraclavicular regions, more on the right than the left. Electronically Signed   By: Lowella Grip III M.D.   On: 11/05/2019 09:11   DG Chest Port 1 View  Result Date: 11/04/2019 CLINICAL DATA:  PICC line placement EXAM: PORTABLE CHEST 1 VIEW COMPARISON:  11/04/2019 FINDINGS: Right PICC line is been placed. The tip is at the cavoatrial junction. Stable diffuse bilateral airspace disease. No visible effusion or pneumothorax. Subcutaneous emphysema again noted in the right neck base, stable. No acute bony abnormality. IMPRESSION: Right PICC line tip at the cavoatrial junction. Stable bilateral airspace disease. Electronically Signed   By: Rolm Baptise M.D.   On: 11/04/2019 18:46   DG Chest Port 1 View  Result Date: 11/04/2019 CLINICAL DATA:  Short of breath. EXAM: PORTABLE CHEST 1 VIEW  COMPARISON:  11/02/2019 FINDINGS: Normal heart size. Extensive, bilateral pulmonary opacities are identified which appear unchanged from previous exam. No pneumothorax. Subcutaneous emphysema is identified within the right neck. Etiology indeterminate. IMPRESSION: 1. No change in aeration a lungs compared with previous exam. 2. Right neck subcu emphysema noted. Electronically Signed   By: Kerby Moors M.D.   On: 11/04/2019 09:33   DG Chest Port 1 View  Result Date: 11/02/2019 CLINICAL DATA:  Shortness of breath, COVID positive EXAM: PORTABLE CHEST 1 VIEW COMPARISON:  11/01/2019 FINDINGS: Diffuse bilateral pulmonary opacities. Worsening left lung aeration. No pneumothorax. Pneumomediastinum is similar. Stable cardiomediastinal contours. No significant pleural effusion. IMPRESSION: Bilateral pneumonia with worsening lung aeration on the left. Similar pneumomediastinum. Electronically Signed   By: Macy Mis M.D.   On: 11/02/2019 07:04   DG Chest Port 1 View  Result Date: 11/01/2019 CLINICAL DATA:  Acute respiratory failure EXAM: PORTABLE CHEST 1 VIEW COMPARISON:  Yesterday FINDINGS: Pneumomediastinum is newly seen along both mediastinal contours. No visible pneumothorax. Extensive bilateral pneumonia. Cardiomegaly and aortic tortuosity. These results will be called to the ordering clinician or representative by the Radiologist Assistant, and communication documented in the PACS or [employer]. IMPRESSION: 1. New pneumomediastinum. 2. Stable bilateral pneumonia.  No visible pneumothorax. Electronically Signed   By: Angelica Chessman  Watts M.D.   On: 11/01/2019 07:21   DG CHEST PORT 1 VIEW  Result Date: 10/31/2019 CLINICAL DATA:  Tachypnea EXAM: PORTABLE CHEST 1 VIEW COMPARISON:  10/31/2019, 10/30/2019, 10/28/2019 FINDINGS: Rotated patient. Fairly extensive bilateral ground-glass opacities and consolidations probably without significant change allowing for rotated patient. Enlarged cardiomediastinal  silhouette also exaggerated by rotation. No pleural effusion or pneumothorax. IMPRESSION: Likely no significant interval change in fairly extensive bilateral airspace disease and consolidations allowing for patient rotation Electronically Signed   By: Donavan Foil M.D.   On: 10/31/2019 19:56   DG Chest Port 1 View  Result Date: 10/31/2019 CLINICAL DATA:  Shortness of breath. COVID-19 viral pneumonia. EXAM: PORTABLE CHEST 1 VIEW COMPARISON:  02/21/2020 FINDINGS: Heart size remains within normal limits. Bilateral diffuse heterogeneous airspace opacity shows no significant change. No evidence of pneumothorax or pleural effusion. IMPRESSION: No significant change in diffuse heterogeneous airspace disease. Electronically Signed   By: Marlaine Hind M.D.   On: 10/31/2019 08:16   DG Chest Port 1 View  Result Date: 10/30/2019 CLINICAL DATA:  COVID positive. EXAM: PORTABLE CHEST 1 VIEW COMPARISON:  Chest x-ray dated October 28, 2019. FINDINGS: Stable cardiomediastinal silhouette. Normal pulmonary vascularity. Low lung volumes. Patchy interstitial and airspace opacities throughout both lungs, mildly worsened at the left lung base. No pleural effusion or pneumothorax. No acute osseous abnormality. IMPRESSION: 1. Multifocal pneumonia, mildly worsened at the left lung base. Electronically Signed   By: Titus Dubin M.D.   On: 10/30/2019 08:26   DG Chest Portable 1 View  Result Date: 10/28/2019 CLINICAL DATA:  Dyspnea with exertion. EXAM: PORTABLE CHEST 1 VIEW COMPARISON:  February 28, 2017. FINDINGS: Stable cardiomediastinal silhouette. Central pulmonary vascular congestion is noted. Bilateral lung opacities are noted which may represent edema or possibly multifocal pneumonia. Atherosclerosis of thoracic aorta is noted. No pneumothorax or pleural effusion is noted. Bony thorax is unremarkable. IMPRESSION: Aortic atherosclerosis. Central pulmonary vascular congestion is noted. Bilateral lung opacities are noted which may  represent edema or possibly multifocal pneumonia. Aortic Atherosclerosis (ICD10-I70.0). Electronically Signed   By: Marijo Conception M.D.   On: 10/28/2019 15:42   ECHOCARDIOGRAM COMPLETE  Result Date: 10/29/2019    ECHOCARDIOGRAM REPORT   Patient Name:   NEVAH LICHTER Date of Exam: 10/29/2019 Medical Rec #:  [id number]      Height:       61.0 in Accession #:    [id number]     Weight:       270.0 lb Date of Birth:  February 14, 1946      BSA:          2.146 m Patient Age:    13 years       BP:           136/73 mmHg Patient Gender: F              HR:           135 bpm. Exam Location:  Inpatient Procedure: 2D Echo, Cardiac Doppler and Color Doppler Indications:    I48.0 Paroxysmal atrial fibrillation  History:        Patient has no prior history of Echocardiogram examinations.                 COVID-19 Positive.  Sonographer:    Jonelle Sidle Dance Referring Phys: [place]  1. Normal LV function; mild LAE.  2. Left ventricular ejection fraction, by estimation, is 55 to 60%. The left ventricle has normal function.  The left ventricle has no regional wall motion abnormalities. Left ventricular diastolic function could not be evaluated.  3. Right ventricular systolic function is normal. The right ventricular size is normal. Tricuspid regurgitation signal is inadequate for assessing PA pressure.  4. Left atrial size was mildly dilated.  5. The mitral valve is normal in structure. Trivial mitral valve regurgitation. No evidence of mitral stenosis.  6. The aortic valve is tricuspid. Aortic valve regurgitation is not visualized. No aortic stenosis is present.  7. The inferior vena cava is normal in size with greater than 50% respiratory variability, suggesting right atrial pressure of 3 mmHg. FINDINGS  Left Ventricle: Left ventricular ejection fraction, by estimation, is 55 to 60%. The left ventricle has normal function. The left ventricle has no regional wall motion abnormalities. The left ventricular  internal cavity size was normal in size. There is  no left ventricular hypertrophy. Left ventricular diastolic function could not be evaluated due to atrial fibrillation. Left ventricular diastolic function could not be evaluated. Right Ventricle: The right ventricular size is normal. Right ventricular systolic function is normal. Tricuspid regurgitation signal is inadequate for assessing PA pressure. Left Atrium: Left atrial size was mildly dilated. Right Atrium: Right atrial size was normal in size. Pericardium: There is no evidence of pericardial effusion. Mitral Valve: The mitral valve is normal in structure. Normal mobility of the mitral valve leaflets. Trivial mitral valve regurgitation. No evidence of mitral valve stenosis. Tricuspid Valve: The tricuspid valve is normal in structure. Tricuspid valve regurgitation is mild . No evidence of tricuspid stenosis. Aortic Valve: The aortic valve is tricuspid. Aortic valve regurgitation is not visualized. No aortic stenosis is present. Pulmonic Valve: The pulmonic valve was not well visualized. Pulmonic valve regurgitation is trivial. No evidence of pulmonic stenosis. Aorta: The aortic root is normal in size and structure. Venous: The inferior vena cava is normal in size with greater than 50% respiratory variability, suggesting right atrial pressure of 3 mmHg.  Additional Comments: Normal LV function; mild LAE.  LEFT VENTRICLE PLAX 2D LVIDd:         4.00 cm LVIDs:         2.85 cm LV PW:         1.10 cm LV IVS:        1.13 cm LVOT diam:     1.85 cm LV SV:         40 LV SV Index:   19 LVOT Area:     2.69 cm  RIGHT VENTRICLE             IVC RV Basal diam:  2.82 cm     IVC diam: 1.67 cm RV S prime:     12.20 cm/s TAPSE (M-mode): 0.9 cm LEFT ATRIUM           Index       RIGHT ATRIUM           Index LA diam:      3.20 cm 1.49 cm/m  RA Area:     15.80 cm LA Vol (A2C): 74.0 ml 34.49 ml/m RA Volume:   39.80 ml  18.55 ml/m LA Vol (A4C): 61.6 ml 28.71 ml/m  AORTIC VALVE  LVOT Vmax:   122.50 cm/s LVOT Vmean:  66.600 cm/s LVOT VTI:    0.150 m  AORTA Ao Root diam: 3.30 cm Ao Asc diam:  3.20 cm MITRAL VALVE MV Area (PHT): 3.62 cm    SHUNTS MV Decel Time: 210 msec  Systemic VTI:  0.15 m MV E velocity: 53.35 cm/s  Systemic Diam: 1.85 cm Kirk Ruths MD Electronically signed by Kirk Ruths MD Signature Date/Time: 10/29/2019/2:08:19 PM    Final    VAS [place] LOWER EXTREMITY VENOUS (DVT)  Result Date: 11/03/2019  Lower Venous DVTStudy Indications: Swelling.  Risk Factors: COVID 19 positive. Limitations: Body habitus and poor ultrasound/tissue interface. Comparison Study: No prior studies. Performing Technologist: Oliver Hum RVT  Examination Guidelines: A complete evaluation includes B-mode imaging, spectral Doppler, color Doppler, and power Doppler as needed of all accessible portions of each vessel. Bilateral testing is considered an integral part of a complete examination. Limited examinations for reoccurring indications may be performed as noted. The reflux portion of the exam is performed with the patient in reverse Trendelenburg.  +---------+---------------+---------+-----------+----------+--------------+  RIGHT     Compressibility Phasicity Spontaneity Properties Thrombus Aging  +---------+---------------+---------+-----------+----------+--------------+  CFV       Full            Yes       Yes                                    +---------+---------------+---------+-----------+----------+--------------+  SFJ       Full                                                             +---------+---------------+---------+-----------+----------+--------------+  FV Prox   Full                                                             +---------+---------------+---------+-----------+----------+--------------+  FV Mid    Full                                                             +---------+---------------+---------+-----------+----------+--------------+  FV Distal Full                                                              +---------+---------------+---------+-----------+----------+--------------+  PFV       Full                                                             +---------+---------------+---------+-----------+----------+--------------+  POP       Full            Yes       Yes                                    +---------+---------------+---------+-----------+----------+--------------+  PTV       Full                                                             +---------+---------------+---------+-----------+----------+--------------+  PERO      Full                                                             +---------+---------------+---------+-----------+----------+--------------+   +---------+---------------+---------+-----------+----------+--------------+  LEFT      Compressibility Phasicity Spontaneity Properties Thrombus Aging  +---------+---------------+---------+-----------+----------+--------------+  CFV       Full            Yes       Yes                                    +---------+---------------+---------+-----------+----------+--------------+  SFJ       Full                                                             +---------+---------------+---------+-----------+----------+--------------+  FV Prox   Full                                                             +---------+---------------+---------+-----------+----------+--------------+  FV Mid    Full                                                             +---------+---------------+---------+-----------+----------+--------------+  FV Distal Full                                                             +---------+---------------+---------+-----------+----------+--------------+  PFV       Full                                                             +---------+---------------+---------+-----------+----------+--------------+  POP       Full            Yes       Yes                                     +---------+---------------+---------+-----------+----------+--------------+  PTV       Full                                                             +---------+---------------+---------+-----------+----------+--------------+  PERO      Full                                                             +---------+---------------+---------+-----------+----------+--------------+     Summary: RIGHT: - There is no evidence of deep vein thrombosis in the lower extremity. However, portions of this examination were limited- see technologist comments above.  - No cystic structure found in the popliteal fossa.  LEFT: - There is no evidence of deep vein thrombosis in the lower extremity. However, portions of this examination were limited- see technologist comments above.  - No cystic structure found in the popliteal fossa.  *See table(s) above for measurements and observations. Electronically signed by Monica Martinez MD on 11/03/2019 at 4:09:50 PM.    Final    [place] EKG SITE RITE  Result Date: 11/04/2019 If Site Rite image not attached, placement could not be confirmed due to current cardiac rhythm.    Phillips Climes M.D on 11/09/2019 at 2:27 PM  To page go to [email or web address] - password [facility]

## 2019-11-10 DIAGNOSIS — I959 Hypotension, unspecified: Secondary | ICD-10-CM

## 2019-11-10 LAB — GLUCOSE, CAPILLARY
Glucose-Capillary: 95 mg/dL (ref 70–99)
Glucose-Capillary: 107 mg/dL — ABNORMAL HIGH (ref 70–99)
Glucose-Capillary: 117 mg/dL — ABNORMAL HIGH (ref 70–99)
Glucose-Capillary: 158 mg/dL — ABNORMAL HIGH (ref 70–99)

## 2019-11-10 LAB — CBC
HCT: 36.4 % (ref 36.0–46.0)
Hemoglobin: 11.3 g/dL — ABNORMAL LOW (ref 12.0–15.0)
MCH: 30.2 pg (ref 26.0–34.0)
MCHC: 31 g/dL (ref 30.0–36.0)
MCV: 97.3 fL (ref 80.0–100.0)
Platelets: 250 10*3/uL (ref 150–400)
RBC: 3.74 MIL/uL — ABNORMAL LOW (ref 3.87–5.11)
RDW: 13.8 % (ref 11.5–15.5)
WBC: 6.2 10*3/uL (ref 4.0–10.5)
nRBC: 0 % (ref 0.0–0.2)

## 2019-11-10 LAB — BASIC METABOLIC PANEL
Anion gap: 10 (ref 5–15)
BUN: 10 mg/dL (ref 8–23)
CO2: 30 mmol/L (ref 22–32)
Calcium: 8.7 mg/dL — ABNORMAL LOW (ref 8.9–10.3)
Chloride: 101 mmol/L (ref 98–111)
Creatinine, Ser: 1.22 mg/dL — ABNORMAL HIGH (ref 0.44–1.00)
GFR calc Af Amer: 51 mL/min — ABNORMAL LOW (ref >60)
GFR calc non Af Amer: 44 mL/min — ABNORMAL LOW (ref >60)
Glucose, Bld: 137 mg/dL — ABNORMAL HIGH (ref 70–99)
Potassium: 3.8 mmol/L (ref 3.5–5.1)
Sodium: 141 mmol/L (ref 135–145)

## 2019-11-10 MED ORDER — ENOXAPARIN SODIUM 120 MG/0.8ML ~~LOC~~ SOLN
120.0000 mg | Freq: Two times a day (BID) | SUBCUTANEOUS | Status: DC
  Administered 2019-11-10 – 2019-11-12 (×4): 120 mg via SUBCUTANEOUS
  Filled 2019-11-10 (×5): qty 0.8

## 2019-11-10 NOTE — TOC Progression Note (Signed)
Transition of Care [facility]) - Progression Note    Patient Details  Name: Julia Mcfarland MRN: [id number] Date of Birth: [birth year]  Transition of Care [facility]) CM/SW [place], [place] Phone Number: 11/10/2019, 11:42 AM  Clinical Narrative:     CSW left voicemail for Zane Herald to call back to discuss possible SNF placement for patient.  CSW will continue to follow.      Barriers to Discharge: Continued Medical Work up  Expected Discharge Plan and Services   In-house Referral: Clinical Social Work     Living arrangements for the past 2 months: Single Family Home                                       Social Determinants of Health (SDOH) Interventions    Readmission Risk Interventions Readmission Risk Prevention Plan 11/08/2019  Transportation Screening Complete  PCP or Specialist Appt within 5-7 Days Complete  Home Care Screening Complete  Medication Review (RN CM) Complete  Some recent data might be hidden

## 2019-11-10 NOTE — Progress Notes (Signed)
ANTICOAGULATION CONSULT NOTE - Follow Up Consult  Pharmacy Consult for Lovenox Indication: atrial fibrillation  No Known Allergies  Patient Measurements: Height: 5\' 1"  (154.9 cm) Weight: 124.7 kg (274 lb 14.6 oz) IBW/kg (Calculated) : 47.8 Lovenox Dosing Weight: 124 kg  Vital Signs: Temp: 98.4 F (36.9 C) (05/09 0800) Temp Source: Axillary (05/09 0800) BP: 88/64 (05/09 0800) Pulse Rate: 71 (05/09 0800)  Labs: Recent Labs    11/08/19 0204 11/09/19 0340  HGB 12.9 11.6*  HCT 41.2 37.3  PLT 335 279  CREATININE 1.23* 1.14*    Estimated Creatinine Clearance: 55.3 mL/min (A) (by C-G formula based on SCr of 1.14 mg/dL (H)).  Assessment:  74 yr old female transitioned from IV Heparin to Eliquis on 4/29. D-dimer continued to climb while on Eliquis. MD consulted Pharmacy to change to Full Dose Lovenox therapy on 5/4.  On 5/7, patient developed epistaxis thought to be caused by prolonged high flow nasal canula use. Epistaxis required a Rhino Rocket insertion, since epistaxis has improved. Lovenox has been held for a total of 4 doses since 5/7. Today (5/9), epistaxis has significantly improved and will resume full dose anticoagulation with Lovenox  Goal of Therapy:  Anti-Xa level 0.6-1 units/ml 4hrs after LMWH dose given Monitor platelets by anticoagulation protocol: Yes   Plan:  Resume Lovenox 120 mg SQ q12hrs   Follow up for any further bleeding.  Follow up for transition back to Eliquis when able.  Acey Lav, PharmD  PGY1 Acute Care Pharmacy Resident Phone: [phone] 11/10/2019,2:13 PM

## 2019-11-10 NOTE — Progress Notes (Signed)
PROGRESS NOTE                                                                                                                                                                                                             [name], is a 74 y.o. female, DOB - December 24, 1945, [id number]  Admit date - 10/28/2019   Admitting Physician No admitting provider for patient encounter.  Outpatient Primary MD for the patient is Garnette Gunner Coralie Keens, NP  LOS - 39  Chief Complaint  Patient presents with  . Tachycardia       Brief Narrative    Julia Mcfarland is a 74 y.o. female with medical history significant of chronic venous insufficiency, arthritis, and morbid obesity presented complaints of palpitations, generalized malaise, and shortness of breath over the last 3 days.  Associated symptoms included chills, generalized weakness, and a nonproductive cough.  Upon admission she was found to be in A. fib with RVR with heart rates into the 170s.     She was admitted by cardiology team on 26 April subsequently found to have COVID-19 pneumonia and hospitalist team was consulted.  She had by that time developed severe parenchymal injury with severe hypoxia, briefly was in ICU for heated high flow now back to progressive care unit with heated high flow, she remains with significant oxygen requirement and heated high flow, as well hospital stay was noted for A. fib with RVR, for which cardiology is consulted.    Subjective:   Patient in bed, reports dyspnea has improved , as well her epistaxis has significantly improved after Rhino Rocket's .   Assessment  & Plan :      Acute hypoxic respiratory failure due to [id number] pneumonia complicated by pneumomediastinum.   - She was initially treated for the first 2 to 3 days of hospitalization with IV steroids and remdesivir, however she continued to decline, with significant increase of oxygen requirement, which she required  heated high flow nasal cannula , patient did require 30 L heated high flow nasal cannula yesterday, her hypoxia was slowly improved, she is currently on 5 L nasal cannula (versus NRB mainly due to nasal packing and able to tolerate nasal cannula) -She received Actemra . -Use incentive spirometry and flutter valve . -Overall she remains very tenuous, continue to monitor her progressive care . - No signs of bacterial infection or esophageal rupture.  No chest or abdominal pain.  Gradually improving continue to monitor. -She  did develop pneumomediastinum, she denies any chest pain today.  SpO2: 100 % O2 Flow Rate (L/min): 5 L/min FiO2 (%): 40 %    Recent Labs  Lab 11/04/19 0500 11/04/19 2016 11/04/19 2016 11/05/19 0517 11/06/19 0546 11/07/19 0258 11/08/19 0204 11/09/19 0340  NA  --  141   < > 139 140 138 141 141  K  --  3.3*   < > 2.9* 3.6 3.4* 4.0 3.6  CL  --  98   < > 98 99 98 99 100  CO2  --  32   < > 32 32 31 32 32  GLUCOSE  --  163*   < > 127* 111* 95 91 89  BUN  --  24*   < > 24* 20 18 17 14   CREATININE  --  1.14*   < > 1.17* 1.29* 1.24* 1.23* 1.14*  CALCIUM  --  7.9*   < > 7.9* 8.3* 8.4* 8.5* 8.6*  AST  --  25  --  24 31 31   --   --   ALT  --  23  --  23 24 28   --   --   ALKPHOS  --  58  --  56 55 53  --   --   BILITOT  --  1.0  --  0.8 1.3* 1.1  --   --   ALBUMIN  --  2.4*  --  2.4* 2.4* 2.5*  --   --   MG  --  2.2  --  2.1 2.2 2.0  --   --   CRP  --  0.9  --  0.8 0.6 0.6  --   --   DDIMER  --  7.92*  --  7.04* 3.05* 2.24*  --   --   PROCALCITON  --  <0.10  --  <0.10 <0.10  --   --   --   BNP 74.4  --   --  74.3 45.5  --   --   --    < > = values in this interval not displayed.    Recent Labs  Lab 11/04/19 0500 11/04/19 2016 11/05/19 0517 11/06/19 0546 11/07/19 0258  CRP  --  0.9 0.8 0.6 0.6  DDIMER  --  7.92* 7.04* 3.05* 2.24*  BNP 74.4  --  74.3 45.5  --   PROCALCITON  --  <0.10 <0.10 <0.10  --       Lab Results  Component Value Date   TSH 0.725  10/28/2019    Paroxysmal A. fib RVR.  -  Mali vas 2 score of greater than 3.  Cardiology on board, stable TSH and echocardiogram, initially required IV amiodarone and digoxin, currently transition to oral amiodarone, oral Cardizem dose increased on 11/04/2019 for better control. -Per cardiology recommendation, continue with Cardizem 90 mg every 8 hours, and consulted.  To discharge, as well decrease amiodarone 200 mg twice daily to once daily prior to discharge . -She did convert to normal sinus rhythm 5/8, blood pressure remains low, so I will go ahead and DC all Cardizem. -Continue with full anticoagulation dose Lovenox (resumed today given her epistaxis has improved). -She is on midodrine for soft blood pressure while on heart rate controlling agents, have decreased to 5 mg oral 3 times daily. -Keep potassium> 4, magnesium> 2  Epistaxis -This is most likely due to prolonged high flow nasal cannula use, she had significant epistaxis 5/7 and 5/8 AM, required bilateral lateral Rhino  Rocket insertion, bleeding has significantly improved, Lovenox was on hold over last 36 hours, will resume today, will monitor CBC closely. -Started on doxycycline given her Rhino Rocket.   Obesity with BMI of 51.   - Follow with PCP for weight loss.   AKI.   - Clinically now appears dehydrated, gentle IV fluids and monitor.   Metabolic encephalopathy.  -  Was due to hypoxia completely resolved after heated high flow, p.m. Seroquel.    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  Hypokalemia.   -Repleted  Rising D-dimer despite being on Eliquis.  This is likely due to inflammation from COVID-19, leg ultrasound is negative, for now full dose Lovenox till D-dimer starts trending down thereafter switch back to Eliquis.     Hypotension. -On midodrine, blood pressure is lower today, will DC her Cardizem  DM type II.  Lantus plus sliding scale monitor and adjust.    Lab Results  Component Value Date   HGBA1C 6.3 (H) 10/28/2019   CBG (last 3)  Recent Labs    11/09/19 2102 11/10/19 0753 11/10/19 1202  GLUCAP 140* 95 107*      Family Communication  :  -Husband is currently hospitalized at [facility] . - D/W son 5/8  Code Status :  Full  Disposition Plan  : Patient is not medically stable for discharge, she remains in significant oxygen requirement.  Consults  : Cards  Procedures  :    Leg [place] - No DVT  TTE -  1. Normal LV function; mild LAE.  2. Left ventricular ejection fraction, by estimation, is 55 to 60%. The left ventricle has normal function. The left ventricle has no regional wall motion abnormalities. Left ventricular diastolic function could not be evaluated.  3. Right ventricular systolic function is normal. The right ventricular size is normal. Tricuspid regurgitation signal is inadequate for assessing PA pressure.  4. Left atrial size was mildly dilated.  5. The mitral valve is normal in structure. Trivial mitral valve regurgitation. No evidence of mitral stenosis.  6. The aortic valve is tricuspid. Aortic valve regurgitation is not visualized. No aortic stenosis is present.  7. The inferior vena cava is normal in size with greater than 50% respiratory variability, suggesting right atrial pressure of 3 mmHg.  DVT Prophylaxis  :  Eliquis  Lab Results  Component Value Date   PLT 279 11/09/2019    Diet :  Diet Order            DIET SOFT Room service appropriate? Yes; Fluid consistency: Thin  Diet effective now               Inpatient Medications Scheduled Meds: . amiodarone  200 mg Oral BID  . Chlorhexidine Gluconate Cloth  6 each Topical Daily  . diltiazem  30 mg Oral Q8H  . doxycycline  100 mg Oral Q12H  . insulin aspart  0-20 Units Subcutaneous TID WC  .  insulin detemir  15 Units Subcutaneous Daily  . midodrine  5 mg Oral TID WC  . neomycin-bacitracin-polymyxin   Topical Daily  . pantoprazole  40 mg Oral Daily  . QUEtiapine  25 mg Oral QHS  . senna-docusate  2 tablet Oral BID   Continuous Infusions:  PRN Meds:.acetaminophen, albuterol, metoprolol tartrate, ondansetron (ZOFRAN) IV, sodium chloride, sodium chloride flush  Antibiotics  :   Anti-infectives (From admission, onward)   Start     Dose/Rate Route Frequency Ordered Stop   11/09/19 1630  doxycycline (VIBRA-TABS) tablet 100 mg     100 mg Oral Every 12 hours 11/09/19 1615     10/30/19 1000  remdesivir 100 mg in sodium chloride 0.9 % 100 mL IVPB     100 mg 200 mL/hr over 30 Minutes Intravenous Daily 10/29/19 0914 11/02/19 0956   10/29/19 1000  remdesivir 200 mg in sodium chloride 0.9% 250 mL IVPB     200 mg 580 mL/hr over 30 Minutes Intravenous Once 10/29/19 0914 10/29/19 1236         Objective:   Vitals:   11/10/19 0500 11/10/19 0609 11/10/19 0610 11/10/19 0800  BP:  (!) 72/58 96/69 (!) 88/64  Pulse: 69 75 74 71  Resp: 16 11 18 18   Temp:  (!) 97.5 F (36.4 C) (!) 97.5 F (36.4 C) 98.4 F (36.9 C)  TempSrc:  Oral Oral Axillary  SpO2: 100% 100% 100% 100%  Weight: 124.7 kg     Height:        SpO2: 100 % O2 Flow Rate (L/min): 5 L/min FiO2 (%): 40 %  Wt Readings from Last 3 Encounters:  11/10/19 124.7 kg  09/19/19 132 kg  02/18/19 127.9 kg     Intake/Output Summary (Last 24 hours) at 11/10/2019 1356 Last data filed at 11/10/2019 0552 Gross per 24 hour  Intake 240 ml  Output 600 ml  Net -360 ml     Physical Exam  Awake Alert, Oriented X 3, No new F.N deficits, Normal affect Symmetrical Chest wall movement, Good air movement bilaterally, CTAB RRR,No Gallops,Rubs or new Murmurs, No Parasternal Heave +ve B.Sounds, Abd Soft, No tenderness, No rebound - guarding or rigidity. No Cyanosis, Clubbing or edema, No new Rash or bruise      Data Review:     Recent Labs  Lab 11/04/19 2317 11/05/19 0517 11/06/19 0546 11/08/19 0204 11/09/19 0340  WBC 6.3 7.6 6.2 5.9 7.9  HGB 14.6 15.0 14.2 12.9 11.6*  HCT 45.6 46.3* 44.5 41.2 37.3  PLT 396 408* 400 335 279  MCV 94.8 94.9 95.5 96.0 96.4  MCH 30.4 30.7 30.5 30.1 30.0  MCHC 32.0 32.4 31.9 31.3 31.1  RDW 13.0 13.0 13.2 13.4 13.5    Recent Labs  Lab 11/04/19 0500 11/04/19 2016 11/04/19 2016 11/05/19 0517 11/06/19 0546 11/07/19 0258 11/08/19 0204 11/09/19 0340  NA  --  141   < > 139 140 138 141 141  K  --  3.3*   < > 2.9* 3.6 3.4* 4.0 3.6  CL  --  98   < > 98 99 98 99 100  CO2  --  32   < > 32 32 31 32 32  GLUCOSE  --  163*   < > 127* 111* 95 91 89  BUN  --  24*   < > 24* 20 18 17 14   CREATININE  --  1.14*   < > 1.17* 1.29* 1.24* 1.23* 1.14*  CALCIUM  --  7.9*   < > 7.9* 8.3* 8.4* 8.5* 8.6*  AST  --  25  --  24 31 31   --   --   ALT  --  23  --  23 24 28   --   --   ALKPHOS  --  58  --  56 55 53  --   --   BILITOT  --  1.0  --  0.8 1.3* 1.1  --   --   ALBUMIN  --  2.4*  --  2.4* 2.4* 2.5*  --   --   MG  --  2.2  --  2.1 2.2 2.0  --   --   CRP  --  0.9  --  0.8 0.6 0.6  --   --   DDIMER  --  7.92*  --  7.04* 3.05* 2.24*  --   --   PROCALCITON  --  <0.10  --  <0.10 <0.10  --   --   --   BNP 74.4  --   --  74.3 45.5  --   --   --    < > = values in this interval not displayed.    Recent Labs  Lab 11/04/19 0500 11/04/19 2016 11/05/19 0517 11/06/19 0546 11/07/19 0258  CRP  --  0.9 0.8 0.6 0.6  DDIMER  --  7.92* 7.04* 3.05* 2.24*  BNP 74.4  --  74.3 45.5  --   PROCALCITON  --  <0.10 <0.10 <0.10  --     ------------------------------------------------------------------------------------------------------------------ No results for input(s): CHOL, HDL, LDLCALC, TRIG, CHOLHDL, LDLDIRECT in the last 72 hours.  Lab Results  Component Value Date   HGBA1C 6.3 (H) 10/28/2019    ------------------------------------------------------------------------------------------------------------------ No results for input(s): TSH, T4TOTAL, T3FREE, THYROIDAB in the last 72 hours.  Invalid input(s): FREET3 ------------------------------------------------------------------------------------------------------------------ No results for input(s): VITAMINB12, FOLATE, FERRITIN, TIBC, IRON, RETICCTPCT in the last 72 hours.  Coagulation profile No results for input(s): INR, PROTIME in the last 168 hours.  No results for input(s): DDIMER in the last 72 hours.  Cardiac Enzymes No results for input(s): CKMB, TROPONINI, MYOGLOBIN in the last 168 hours.  Invalid input(s): CK ------------------------------------------------------------------------------------------------------------------    Component Value Date/Time   BNP 45.5 11/06/2019 0546    Micro Results Recent Results (from the past 240 hour(s))  Culture, blood (Routine X 2) w Reflex to ID Panel     Status: None   Collection Time: 11/01/19 12:17 AM   Specimen: BLOOD LEFT HAND  Result Value Ref Range Status   Specimen Description BLOOD LEFT HAND  Final   Special Requests   Final    BOTTLES DRAWN AEROBIC AND ANAEROBIC Blood Culture adequate volume   Culture   Final    NO GROWTH 5 DAYS Performed at [facility], [address] [place], [place] [address]    Report Status 11/06/2019 FINAL  Final  Culture, blood (Routine X 2) w Reflex to ID Panel     Status: None   Collection Time: 11/01/19  1:02 AM   Specimen: BLOOD  Result Value Ref Range Status   Specimen Description BLOOD SITE NOT SPECIFIED  Final   Special Requests   Final    BOTTLES DRAWN AEROBIC AND ANAEROBIC Blood Culture adequate volume   Culture   Final    NO GROWTH 5 DAYS Performed at [facility], [place] [address] [place], [place] [address]    Report Status 11/06/2019 FINAL  Final    Radiology Reports DG Chest Port 1 View  Result  Date: 11/05/2019 CLINICAL DATA:  Shortness of breath EXAM: PORTABLE CHEST 1 VIEW COMPARISON:  Nov 04, 2019 and Nov 02, 2019 FINDINGS: Central catheter tip is in the superior vena cava. No pneumothorax. Note that there is subcutaneous air in the supraclavicular regions, stable. There is also a degree of pneumomediastinum, less pronounced than on study from  3 days prior. There is airspace opacity bilaterally with consolidation in the right lower lobe. No new opacity evident. Heart size and pulmonary vascularity are normal. No adenopathy. There is degenerative change in thoracic spine. IMPRESSION: Multifocal airspace opacity with consolidation right base, similar to recent studies. Stable cardiac silhouette. Central catheter tip in superior vena cava. No pneumothorax. However, there is pneumomediastinum as well as subcutaneous air in the supraclavicular regions, more on the right than the left. Electronically Signed   By: Lowella Grip III M.D.   On: 11/05/2019 09:11   DG Chest Port 1 View  Result Date: 11/04/2019 CLINICAL DATA:  PICC line placement EXAM: PORTABLE CHEST 1 VIEW COMPARISON:  11/04/2019 FINDINGS: Right PICC line is been placed. The tip is at the cavoatrial junction. Stable diffuse bilateral airspace disease. No visible effusion or pneumothorax. Subcutaneous emphysema again noted in the right neck base, stable. No acute bony abnormality. IMPRESSION: Right PICC line tip at the cavoatrial junction. Stable bilateral airspace disease. Electronically Signed   By: Rolm Baptise M.D.   On: 11/04/2019 18:46   DG Chest Port 1 View  Result Date: 11/04/2019 CLINICAL DATA:  Short of breath. EXAM: PORTABLE CHEST 1 VIEW COMPARISON:  11/02/2019 FINDINGS: Normal heart size. Extensive, bilateral pulmonary opacities are identified which appear unchanged from previous exam. No pneumothorax. Subcutaneous emphysema is identified within the right neck. Etiology indeterminate. IMPRESSION: 1. No change in aeration a lungs  compared with previous exam. 2. Right neck subcu emphysema noted. Electronically Signed   By: Kerby Moors M.D.   On: 11/04/2019 09:33   DG Chest Port 1 View  Result Date: 11/02/2019 CLINICAL DATA:  Shortness of breath, COVID positive EXAM: PORTABLE CHEST 1 VIEW COMPARISON:  11/01/2019 FINDINGS: Diffuse bilateral pulmonary opacities. Worsening left lung aeration. No pneumothorax. Pneumomediastinum is similar. Stable cardiomediastinal contours. No significant pleural effusion. IMPRESSION: Bilateral pneumonia with worsening lung aeration on the left. Similar pneumomediastinum. Electronically Signed   By: Macy Mis M.D.   On: 11/02/2019 07:04   DG Chest Port 1 View  Result Date: 11/01/2019 CLINICAL DATA:  Acute respiratory failure EXAM: PORTABLE CHEST 1 VIEW COMPARISON:  Yesterday FINDINGS: Pneumomediastinum is newly seen along both mediastinal contours. No visible pneumothorax. Extensive bilateral pneumonia. Cardiomegaly and aortic tortuosity. These results will be called to the ordering clinician or representative by the Radiologist Assistant, and communication documented in the PACS or [employer]. IMPRESSION: 1. New pneumomediastinum. 2. Stable bilateral pneumonia.  No visible pneumothorax. Electronically Signed   By: Monte Fantasia M.D.   On: 11/01/2019 07:21   DG CHEST PORT 1 VIEW  Result Date: 10/31/2019 CLINICAL DATA:  Tachypnea EXAM: PORTABLE CHEST 1 VIEW COMPARISON:  10/31/2019, 10/30/2019, 10/28/2019 FINDINGS: Rotated patient. Fairly extensive bilateral ground-glass opacities and consolidations probably without significant change allowing for rotated patient. Enlarged cardiomediastinal silhouette also exaggerated by rotation. No pleural effusion or pneumothorax. IMPRESSION: Likely no significant interval change in fairly extensive bilateral airspace disease and consolidations allowing for patient rotation Electronically Signed   By: Donavan Foil M.D.   On: 10/31/2019 19:56   DG  Chest Port 1 View  Result Date: 10/31/2019 CLINICAL DATA:  Shortness of breath. COVID-19 viral pneumonia. EXAM: PORTABLE CHEST 1 VIEW COMPARISON:  02/21/2020 FINDINGS: Heart size remains within normal limits. Bilateral diffuse heterogeneous airspace opacity shows no significant change. No evidence of pneumothorax or pleural effusion. IMPRESSION: No significant change in diffuse heterogeneous airspace disease. Electronically Signed   By: Marlaine Hind M.D.   On: 10/31/2019 08:16  DG Chest Port 1 View  Result Date: 10/30/2019 CLINICAL DATA:  COVID positive. EXAM: PORTABLE CHEST 1 VIEW COMPARISON:  Chest x-ray dated October 28, 2019. FINDINGS: Stable cardiomediastinal silhouette. Normal pulmonary vascularity. Low lung volumes. Patchy interstitial and airspace opacities throughout both lungs, mildly worsened at the left lung base. No pleural effusion or pneumothorax. No acute osseous abnormality. IMPRESSION: 1. Multifocal pneumonia, mildly worsened at the left lung base. Electronically Signed   By: Titus Dubin M.D.   On: 10/30/2019 08:26   DG Chest Portable 1 View  Result Date: 10/28/2019 CLINICAL DATA:  Dyspnea with exertion. EXAM: PORTABLE CHEST 1 VIEW COMPARISON:  February 28, 2017. FINDINGS: Stable cardiomediastinal silhouette. Central pulmonary vascular congestion is noted. Bilateral lung opacities are noted which may represent edema or possibly multifocal pneumonia. Atherosclerosis of thoracic aorta is noted. No pneumothorax or pleural effusion is noted. Bony thorax is unremarkable. IMPRESSION: Aortic atherosclerosis. Central pulmonary vascular congestion is noted. Bilateral lung opacities are noted which may represent edema or possibly multifocal pneumonia. Aortic Atherosclerosis (ICD10-I70.0). Electronically Signed   By: Marijo Conception M.D.   On: 10/28/2019 15:42   ECHOCARDIOGRAM COMPLETE  Result Date: 10/29/2019    ECHOCARDIOGRAM REPORT   Patient Name:   Julia Mcfarland Date of Exam: 10/29/2019  Medical Rec #:  [id number]      Height:       61.0 in Accession #:    [id number]     Weight:       270.0 lb Date of Birth:  04-13-1946      BSA:          2.146 m Patient Age:    35 years       BP:           136/73 mmHg Patient Gender: F              HR:           135 bpm. Exam Location:  Inpatient Procedure: 2D Echo, Cardiac Doppler and Color Doppler Indications:    I48.0 Paroxysmal atrial fibrillation  History:        Patient has no prior history of Echocardiogram examinations.                 COVID-19 Positive.  Sonographer:    Jonelle Sidle Dance Referring Phys: [place]  1. Normal LV function; mild LAE.  2. Left ventricular ejection fraction, by estimation, is 55 to 60%. The left ventricle has normal function. The left ventricle has no regional wall motion abnormalities. Left ventricular diastolic function could not be evaluated.  3. Right ventricular systolic function is normal. The right ventricular size is normal. Tricuspid regurgitation signal is inadequate for assessing PA pressure.  4. Left atrial size was mildly dilated.  5. The mitral valve is normal in structure. Trivial mitral valve regurgitation. No evidence of mitral stenosis.  6. The aortic valve is tricuspid. Aortic valve regurgitation is not visualized. No aortic stenosis is present.  7. The inferior vena cava is normal in size with greater than 50% respiratory variability, suggesting right atrial pressure of 3 mmHg. FINDINGS  Left Ventricle: Left ventricular ejection fraction, by estimation, is 55 to 60%. The left ventricle has normal function. The left ventricle has no regional wall motion abnormalities. The left ventricular internal cavity size was normal in size. There is  no left ventricular hypertrophy. Left ventricular diastolic function could not be evaluated due to atrial fibrillation. Left ventricular diastolic function could not  be evaluated. Right Ventricle: The right ventricular size is normal. Right  ventricular systolic function is normal. Tricuspid regurgitation signal is inadequate for assessing PA pressure. Left Atrium: Left atrial size was mildly dilated. Right Atrium: Right atrial size was normal in size. Pericardium: There is no evidence of pericardial effusion. Mitral Valve: The mitral valve is normal in structure. Normal mobility of the mitral valve leaflets. Trivial mitral valve regurgitation. No evidence of mitral valve stenosis. Tricuspid Valve: The tricuspid valve is normal in structure. Tricuspid valve regurgitation is mild . No evidence of tricuspid stenosis. Aortic Valve: The aortic valve is tricuspid. Aortic valve regurgitation is not visualized. No aortic stenosis is present. Pulmonic Valve: The pulmonic valve was not well visualized. Pulmonic valve regurgitation is trivial. No evidence of pulmonic stenosis. Aorta: The aortic root is normal in size and structure. Venous: The inferior vena cava is normal in size with greater than 50% respiratory variability, suggesting right atrial pressure of 3 mmHg.  Additional Comments: Normal LV function; mild LAE.  LEFT VENTRICLE PLAX 2D LVIDd:         4.00 cm LVIDs:         2.85 cm LV PW:         1.10 cm LV IVS:        1.13 cm LVOT diam:     1.85 cm LV SV:         40 LV SV Index:   19 LVOT Area:     2.69 cm  RIGHT VENTRICLE             IVC RV Basal diam:  2.82 cm     IVC diam: 1.67 cm RV S prime:     12.20 cm/s TAPSE (M-mode): 0.9 cm LEFT ATRIUM           Index       RIGHT ATRIUM           Index LA diam:      3.20 cm 1.49 cm/m  RA Area:     15.80 cm LA Vol (A2C): 74.0 ml 34.49 ml/m RA Volume:   39.80 ml  18.55 ml/m LA Vol (A4C): 61.6 ml 28.71 ml/m  AORTIC VALVE LVOT Vmax:   122.50 cm/s LVOT Vmean:  66.600 cm/s LVOT VTI:    0.150 m  AORTA Ao Root diam: 3.30 cm Ao Asc diam:  3.20 cm MITRAL VALVE MV Area (PHT): 3.62 cm    SHUNTS MV Decel Time: 210 msec    Systemic VTI:  0.15 m MV E velocity: 53.35 cm/s  Systemic Diam: 1.85 cm Kirk Ruths MD  Electronically signed by Kirk Ruths MD Signature Date/Time: 10/29/2019/2:08:19 PM    Final    VAS [place] LOWER EXTREMITY VENOUS (DVT)  Result Date: 11/03/2019  Lower Venous DVTStudy Indications: Swelling.  Risk Factors: COVID 19 positive. Limitations: Body habitus and poor ultrasound/tissue interface. Comparison Study: No prior studies. Performing Technologist: Oliver Hum RVT  Examination Guidelines: A complete evaluation includes B-mode imaging, spectral Doppler, color Doppler, and power Doppler as needed of all accessible portions of each vessel. Bilateral testing is considered an integral part of a complete examination. Limited examinations for reoccurring indications may be performed as noted. The reflux portion of the exam is performed with the patient in reverse Trendelenburg.  +---------+---------------+---------+-----------+----------+--------------+ RIGHT    CompressibilityPhasicitySpontaneityPropertiesThrombus Aging +---------+---------------+---------+-----------+----------+--------------+ CFV      Full           Yes      Yes                                 +---------+---------------+---------+-----------+----------+--------------+  SFJ      Full                                                        +---------+---------------+---------+-----------+----------+--------------+ FV Prox  Full                                                        +---------+---------------+---------+-----------+----------+--------------+ FV Mid   Full                                                        +---------+---------------+---------+-----------+----------+--------------+ FV DistalFull                                                        +---------+---------------+---------+-----------+----------+--------------+ PFV      Full                                                        +---------+---------------+---------+-----------+----------+--------------+ POP       Full           Yes      Yes                                 +---------+---------------+---------+-----------+----------+--------------+ PTV      Full                                                        +---------+---------------+---------+-----------+----------+--------------+ PERO     Full                                                        +---------+---------------+---------+-----------+----------+--------------+   +---------+---------------+---------+-----------+----------+--------------+ LEFT     CompressibilityPhasicitySpontaneityPropertiesThrombus Aging +---------+---------------+---------+-----------+----------+--------------+ CFV      Full           Yes      Yes                                 +---------+---------------+---------+-----------+----------+--------------+ SFJ      Full                                                        +---------+---------------+---------+-----------+----------+--------------+  FV Prox  Full                                                        +---------+---------------+---------+-----------+----------+--------------+ FV Mid   Full                                                        +---------+---------------+---------+-----------+----------+--------------+ FV DistalFull                                                        +---------+---------------+---------+-----------+----------+--------------+ PFV      Full                                                        +---------+---------------+---------+-----------+----------+--------------+ POP      Full           Yes      Yes                                 +---------+---------------+---------+-----------+----------+--------------+ PTV      Full                                                        +---------+---------------+---------+-----------+----------+--------------+ PERO     Full                                                         +---------+---------------+---------+-----------+----------+--------------+     Summary: RIGHT: - There is no evidence of deep vein thrombosis in the lower extremity. However, portions of this examination were limited- see technologist comments above.  - No cystic structure found in the popliteal fossa.  LEFT: - There is no evidence of deep vein thrombosis in the lower extremity. However, portions of this examination were limited- see technologist comments above.  - No cystic structure found in the popliteal fossa.  *See table(s) above for measurements and observations. Electronically signed by Monica Martinez MD on 11/03/2019 at 4:09:50 PM.    Final    [place] EKG SITE RITE  Result Date: 11/04/2019 If Site Rite image not attached, placement could not be confirmed due to current cardiac rhythm.    Phillips Climes M.D on 11/10/2019 at 1:56 PM  To page go to [email or web address] - password [email or web address]

## 2019-11-11 LAB — CBC
HCT: 34.5 % — ABNORMAL LOW (ref 36.0–46.0)
Hemoglobin: 10.7 g/dL — ABNORMAL LOW (ref 12.0–15.0)
MCH: 30.4 pg (ref 26.0–34.0)
MCHC: 31 g/dL (ref 30.0–36.0)
MCV: 98 fL (ref 80.0–100.0)
Platelets: 230 10*3/uL (ref 150–400)
RBC: 3.52 MIL/uL — ABNORMAL LOW (ref 3.87–5.11)
RDW: 13.9 % (ref 11.5–15.5)
WBC: 6.3 10*3/uL (ref 4.0–10.5)
nRBC: 0 % (ref 0.0–0.2)

## 2019-11-11 LAB — BASIC METABOLIC PANEL
Anion gap: 8 (ref 5–15)
BUN: 14 mg/dL (ref 8–23)
CO2: 31 mmol/L (ref 22–32)
Calcium: 8.6 mg/dL — ABNORMAL LOW (ref 8.9–10.3)
Chloride: 103 mmol/L (ref 98–111)
Creatinine, Ser: 1.24 mg/dL — ABNORMAL HIGH (ref 0.44–1.00)
GFR calc Af Amer: 50 mL/min — ABNORMAL LOW (ref >60)
GFR calc non Af Amer: 43 mL/min — ABNORMAL LOW (ref >60)
Glucose, Bld: 108 mg/dL — ABNORMAL HIGH (ref 70–99)
Potassium: 3.5 mmol/L (ref 3.5–5.1)
Sodium: 142 mmol/L (ref 135–145)

## 2019-11-11 LAB — GLUCOSE, CAPILLARY
Glucose-Capillary: 96 mg/dL (ref 70–99)
Glucose-Capillary: 120 mg/dL — ABNORMAL HIGH (ref 70–99)
Glucose-Capillary: 112 mg/dL — ABNORMAL HIGH (ref 70–99)
Glucose-Capillary: 112 mg/dL — ABNORMAL HIGH (ref 70–99)

## 2019-11-11 MED ORDER — POTASSIUM CHLORIDE CRYS ER 20 MEQ PO TBCR
40.0000 meq | EXTENDED_RELEASE_TABLET | Freq: Once | ORAL | Status: AC
  Administered 2019-11-11: 40 meq via ORAL
  Filled 2019-11-11: qty 2

## 2019-11-11 NOTE — Consult Note (Signed)
[facility] Inpatient Consult   11/11/2019  Julia Mcfarland 1945/09/28 [id number]   [facility] Patient:  [name].  Patient is currently admitted with COVID -19 positive to check if potential [facility]  Care Management services are needed.  Review of patient's medical record reveals patient is being recommended for a skilled nursing facility stay as her husband is also hospitalized with COVID-19 positive.  Inpatient Transition of Care team notes that the grandson is being contacted for decisions.  Plan:  If patient transitions to SNF her needs will be met at that level of care for post hospital care. Continue to follow progress and disposition to assess for post hospital care management needs.    Please place a [facility] Care Management consult as appropriate and for questions contact:   Charlesetta Shanks, RN BSN CCM [facility]  [phone] business mobile phone Toll free office [phone]  Fax number: [phone] [email or web address]

## 2019-11-11 NOTE — Progress Notes (Signed)
PROGRESS NOTE                                                                                                                                                                                                             [name], is a 74 y.o. female, DOB - 1945-07-18, [id number]  Admit date - 10/28/2019   Admitting Physician No admitting provider for patient encounter.  Outpatient Primary MD for the patient is Garnette Gunner Coralie Keens, NP  LOS - 14  Chief Complaint  Patient presents with  . Tachycardia       Brief Narrative    Julia Mcfarland is a 74 y.o. female with medical history significant of chronic venous insufficiency, arthritis, and morbid obesity presented complaints of palpitations, generalized malaise, and shortness of breath over the last 3 days.  Associated symptoms included chills, generalized weakness, and a nonproductive cough.  Upon admission she was found to be in A. fib with RVR with heart rates into the 170s.     She was admitted by cardiology team on 26 April subsequently found to have COVID-19 pneumonia and hospitalist team was consulted.  She had by that time developed severe parenchymal injury with severe hypoxia, briefly was in ICU for heated high flow now back to progressive care unit with heated high flow, she remains with significant oxygen requirement and heated high flow, as well hospital stay was noted for A. fib with RVR, for which cardiology is consulted.    Subjective:   Patient in bed, reports dyspnea has improved , as well her epistaxis has significantly improved after Rhino Rocket's .   Assessment  & Plan :      Acute hypoxic respiratory failure due to [id number] pneumonia complicated by pneumomediastinum.   - She was initially treated for the first 2 to 3 days of hospitalization with IV steroids and remdesivir, however she continued to decline, with significant increase of oxygen requirement, which she required  heated high flow nasal cannula , patient did require 30 L heated high flow nasal cannula yesterday, her hypoxia was slowly improved, she is currently on 5 L nasal cannula (versus Ventimask mainly due to nasal packing and able to tolerate nasal cannula) -She received Actemra . -Use incentive spirometry and flutter valve . -Overall she remains very tenuous, continue to monitor her progressive care . - No signs of bacterial infection or esophageal rupture.  No chest or abdominal pain.  Gradually improving continue to monitor. -She  did develop pneumomediastinum, she denies any chest pain today.  SpO2: 95 % O2 Flow Rate (L/min): 4 L/min FiO2 (%): 40 %    Recent Labs  Lab 11/04/19 2016 11/04/19 2016 11/05/19 0517 11/05/19 0517 11/06/19 0546 11/06/19 0546 11/07/19 0258 11/08/19 0204 11/09/19 0340 11/10/19 1508 11/11/19 0328  NA 141   < > 139   < > 140   < > 138 141 141 141 142  K 3.3*   < > 2.9*   < > 3.6   < > 3.4* 4.0 3.6 3.8 3.5  CL 98   < > 98   < > 99   < > 98 99 100 101 103  CO2 32   < > 32   < > 32   < > 31 32 32 30 31  GLUCOSE 163*   < > 127*   < > 111*   < > 95 91 89 137* 108*  BUN 24*   < > 24*   < > 20   < > 18 17 14 10 14   CREATININE 1.14*   < > 1.17*   < > 1.29*   < > 1.24* 1.23* 1.14* 1.22* 1.24*  CALCIUM 7.9*   < > 7.9*   < > 8.3*   < > 8.4* 8.5* 8.6* 8.7* 8.6*  AST 25  --  24  --  31  --  31  --   --   --   --   ALT 23  --  23  --  24  --  28  --   --   --   --   ALKPHOS 58  --  56  --  55  --  53  --   --   --   --   BILITOT 1.0  --  0.8  --  1.3*  --  1.1  --   --   --   --   ALBUMIN 2.4*  --  2.4*  --  2.4*  --  2.5*  --   --   --   --   MG 2.2  --  2.1  --  2.2  --  2.0  --   --   --   --   CRP 0.9  --  0.8  --  0.6  --  0.6  --   --   --   --   DDIMER 7.92*  --  7.04*  --  3.05*  --  2.24*  --   --   --   --   PROCALCITON <0.10  --  <0.10  --  <0.10  --   --   --   --   --   --   BNP  --   --  74.3  --  45.5  --   --   --   --   --   --    < > = values in  this interval not displayed.    Recent Labs  Lab 11/04/19 2016 11/05/19 0517 11/06/19 0546 11/07/19 0258  CRP 0.9 0.8 0.6 0.6  DDIMER 7.92* 7.04* 3.05* 2.24*  BNP  --  74.3 45.5  --   PROCALCITON <0.10 <0.10 <0.10  --       Lab Results  Component Value Date   TSH 0.725 10/28/2019    Paroxysmal A. fib RVR.  -  Mali vas 2 score of greater than 3.  Cardiology on board, stable  TSH and echocardiogram, initially required IV amiodarone and digoxin, currently transition to oral amiodarone, oral Cardizem dose increased on 11/04/2019 for better control. -Per cardiology recommendation, continue with Cardizem 90 mg every 8 hours, and consulted.  To discharge, as well decrease amiodarone 200 mg twice daily to once daily prior to discharge . -She did convert to normal sinus rhythm 5/8, blood pressure remains low, stopped her Cardizem given soft blood pressure. -Continue with full anticoagulation dose Lovenox (has been held temporarily due to epistaxis) -She is on midodrine for soft blood pressure while on heart rate controlling agents, have decreased to 5 mg oral 3 times daily. -Keep potassium> 4, magnesium> 2  Epistaxis -This is most likely due to prolonged high flow nasal cannula use, she had significant epistaxis 5/7 and 5/8 AM, required bilateral lateral Rhino Rocket insertion, bleeding has significantly improved, Lovenox was on hold over last 36 hours, will resume today, will monitor CBC closely. - on doxycycline given her Rhino Rocket.  Obesity with BMI of 51.   - Follow with PCP for weight loss.   AKI.   - Clinically now appears dehydrated, gentle IV fluids and monitor.   Metabolic encephalopathy.  -  Was due to hypoxia completely resolved after heated high flow, p.m. Seroquel.    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  Hypokalemia.   -Repleted  Rising D-dimer despite being on Eliquis.  This is likely due to inflammation from COVID-19, leg ultrasound is negative.   Hypotension. -On midodrine, blood pressure is lower today,stopped her  her Cardizem  DM type II.  Lantus plus sliding scale monitor and adjust.    Lab Results  Component Value Date   HGBA1C 6.3 (H) 10/28/2019   CBG (last 3)  Recent Labs    11/10/19 1805 11/10/19 2106 11/11/19 0737  GLUCAP 117* 158* 96      Family Communication  :  -Husband is currently hospitalized at [facility] . - D/W son 5/8  Code Status :  Full  Disposition Plan  : Patient is not medically stable for discharge, she remains in significant oxygen requirement.  Consults  : Cards  Procedures  :    Leg [place] - No DVT  TTE -  1. Normal LV function; mild LAE.  2. Left ventricular ejection fraction, by estimation, is 55 to 60%. The left ventricle has normal function. The left ventricle has no regional wall motion abnormalities. Left ventricular diastolic function could not be evaluated.  3. Right ventricular systolic function is normal. The right ventricular size is normal. Tricuspid regurgitation signal is inadequate for assessing PA pressure.  4. Left atrial size was mildly dilated.  5. The mitral valve is normal in structure. Trivial mitral valve regurgitation. No evidence of mitral stenosis.  6. The aortic valve is tricuspid. Aortic valve regurgitation is not visualized. No aortic stenosis is present.  7. The inferior vena cava is normal in size with greater than 50% respiratory variability, suggesting right atrial pressure of 3 mmHg.  DVT Prophylaxis  :  Eliquis  Lab Results  Component Value Date   PLT 230 11/11/2019    Diet :  Diet Order            DIET SOFT Room service appropriate? Yes; Fluid consistency: Thin  Diet effective now  Inpatient Medications Scheduled Meds: . amiodarone  200 mg Oral BID  .  Chlorhexidine Gluconate Cloth  6 each Topical Daily  . doxycycline  100 mg Oral Q12H  . enoxaparin (LOVENOX) injection  120 mg Subcutaneous Q12H  . insulin aspart  0-20 Units Subcutaneous TID WC  . insulin detemir  15 Units Subcutaneous Daily  . midodrine  5 mg Oral TID WC  . neomycin-bacitracin-polymyxin   Topical Daily  . pantoprazole  40 mg Oral Daily  . QUEtiapine  25 mg Oral QHS  . senna-docusate  2 tablet Oral BID   Continuous Infusions:  PRN Meds:.acetaminophen, albuterol, metoprolol tartrate, ondansetron (ZOFRAN) IV, sodium chloride, sodium chloride flush  Antibiotics  :   Anti-infectives (From admission, onward)   Start     Dose/Rate Route Frequency Ordered Stop   11/09/19 1630  doxycycline (VIBRA-TABS) tablet 100 mg     100 mg Oral Every 12 hours 11/09/19 1615     10/30/19 1000  remdesivir 100 mg in sodium chloride 0.9 % 100 mL IVPB     100 mg 200 mL/hr over 30 Minutes Intravenous Daily 10/29/19 0914 11/02/19 0956   10/29/19 1000  remdesivir 200 mg in sodium chloride 0.9% 250 mL IVPB     200 mg 580 mL/hr over 30 Minutes Intravenous Once 10/29/19 0914 10/29/19 1236         Objective:   Vitals:   11/10/19 2000 11/11/19 0039 11/11/19 0436 11/11/19 0739  BP: 96/73 97/60 92/76  99/62  Pulse: 80 75 70 69  Resp: 19 16 20 17   Temp: 98.5 F (36.9 C) 98.2 F (36.8 C) 97.7 F (36.5 C) 98.2 F (36.8 C)  TempSrc: Oral Oral Oral Axillary  SpO2: 92% 93% 96% 95%  Weight:   124 kg   Height:        SpO2: 95 % O2 Flow Rate (L/min): 4 L/min FiO2 (%): 40 %  Wt Readings from Last 3 Encounters:  11/11/19 124 kg  09/19/19 132 kg  02/18/19 127.9 kg     Intake/Output Summary (Last 24 hours) at 11/11/2019 1224 Last data filed at 11/11/2019 1000 Gross per 24 hour  Intake 480 ml  Output --  Net 480 ml     Physical Exam  Awake Alert, Oriented X 3, No new F.N deficits, Normal affect Symmetrical Chest wall movement, Good air movement bilaterally, CTAB RRR,No  Gallops,Rubs or new Murmurs, No Parasternal Heave +ve B.Sounds, Abd Soft, No tenderness, No rebound - guarding or rigidity. No Cyanosis, Clubbing or edema, No new Rash or bruise       Data Review:    Recent Labs  Lab 11/06/19 0546 11/08/19 0204 11/09/19 0340 11/10/19 1508 11/11/19 0328  WBC 6.2 5.9 7.9 6.2 6.3  HGB 14.2 12.9 11.6* 11.3* 10.7*  HCT 44.5 41.2 37.3 36.4 34.5*  PLT 400 335 279 250 230  MCV 95.5 96.0 96.4 97.3 98.0  MCH 30.5 30.1 30.0 30.2 30.4  MCHC 31.9 31.3 31.1 31.0 31.0  RDW 13.2 13.4 13.5 13.8 13.9    Recent Labs  Lab 11/04/19 2016 11/04/19 2016 11/05/19 0517 11/05/19 0517 11/06/19 0546 11/06/19 0546 11/07/19 0258 11/08/19 0204 11/09/19 0340 11/10/19 1508 11/11/19 0328  NA 141   < > 139   < > 140   < > 138 141 141 141 142  K 3.3*   < > 2.9*   < > 3.6   < > 3.4* 4.0 3.6 3.8 3.5  CL 98   < > 98   < >  99   < > 98 99 100 101 103  CO2 32   < > 32   < > 32   < > 31 32 32 30 31  GLUCOSE 163*   < > 127*   < > 111*   < > 95 91 89 137* 108*  BUN 24*   < > 24*   < > 20   < > 18 17 14 10 14   CREATININE 1.14*   < > 1.17*   < > 1.29*   < > 1.24* 1.23* 1.14* 1.22* 1.24*  CALCIUM 7.9*   < > 7.9*   < > 8.3*   < > 8.4* 8.5* 8.6* 8.7* 8.6*  AST 25  --  24  --  31  --  31  --   --   --   --   ALT 23  --  23  --  24  --  28  --   --   --   --   ALKPHOS 58  --  56  --  55  --  53  --   --   --   --   BILITOT 1.0  --  0.8  --  1.3*  --  1.1  --   --   --   --   ALBUMIN 2.4*  --  2.4*  --  2.4*  --  2.5*  --   --   --   --   MG 2.2  --  2.1  --  2.2  --  2.0  --   --   --   --   CRP 0.9  --  0.8  --  0.6  --  0.6  --   --   --   --   DDIMER 7.92*  --  7.04*  --  3.05*  --  2.24*  --   --   --   --   PROCALCITON <0.10  --  <0.10  --  <0.10  --   --   --   --   --   --   BNP  --   --  74.3  --  45.5  --   --   --   --   --   --    < > = values in this interval not displayed.    Recent Labs  Lab 11/04/19 2016 11/05/19 0517 11/06/19 0546 11/07/19 0258  CRP 0.9  0.8 0.6 0.6  DDIMER 7.92* 7.04* 3.05* 2.24*  BNP  --  74.3 45.5  --   PROCALCITON <0.10 <0.10 <0.10  --     ------------------------------------------------------------------------------------------------------------------ No results for input(s): CHOL, HDL, LDLCALC, TRIG, CHOLHDL, LDLDIRECT in the last 72 hours.  Lab Results  Component Value Date   HGBA1C 6.3 (H) 10/28/2019   ------------------------------------------------------------------------------------------------------------------ No results for input(s): TSH, T4TOTAL, T3FREE, THYROIDAB in the last 72 hours.  Invalid input(s): FREET3 ------------------------------------------------------------------------------------------------------------------ No results for input(s): VITAMINB12, FOLATE, FERRITIN, TIBC, IRON, RETICCTPCT in the last 72 hours.  Coagulation profile No results for input(s): INR, PROTIME in the last 168 hours.  No results for input(s): DDIMER in the last 72 hours.  Cardiac Enzymes No results for input(s): CKMB, TROPONINI, MYOGLOBIN in the last 168 hours.  Invalid input(s): CK ------------------------------------------------------------------------------------------------------------------    Component Value Date/Time   BNP 45.5 11/06/2019 0546    Micro Results No results found for this or any previous visit (from the past 240 hour(s)).  Radiology Reports  DG Chest Port 1 View  Result Date: 11/05/2019 CLINICAL DATA:  Shortness of breath EXAM: PORTABLE CHEST 1 VIEW COMPARISON:  Nov 04, 2019 and Nov 02, 2019 FINDINGS: Central catheter tip is in the superior vena cava. No pneumothorax. Note that there is subcutaneous air in the supraclavicular regions, stable. There is also a degree of pneumomediastinum, less pronounced than on study from 3 days prior. There is airspace opacity bilaterally with consolidation in the right lower lobe. No new opacity evident. Heart size and pulmonary vascularity are normal. No  adenopathy. There is degenerative change in thoracic spine. IMPRESSION: Multifocal airspace opacity with consolidation right base, similar to recent studies. Stable cardiac silhouette. Central catheter tip in superior vena cava. No pneumothorax. However, there is pneumomediastinum as well as subcutaneous air in the supraclavicular regions, more on the right than the left. Electronically Signed   By: Lowella Grip III M.D.   On: 11/05/2019 09:11   DG Chest Port 1 View  Result Date: 11/04/2019 CLINICAL DATA:  PICC line placement EXAM: PORTABLE CHEST 1 VIEW COMPARISON:  11/04/2019 FINDINGS: Right PICC line is been placed. The tip is at the cavoatrial junction. Stable diffuse bilateral airspace disease. No visible effusion or pneumothorax. Subcutaneous emphysema again noted in the right neck base, stable. No acute bony abnormality. IMPRESSION: Right PICC line tip at the cavoatrial junction. Stable bilateral airspace disease. Electronically Signed   By: Rolm Baptise M.D.   On: 11/04/2019 18:46   DG Chest Port 1 View  Result Date: 11/04/2019 CLINICAL DATA:  Short of breath. EXAM: PORTABLE CHEST 1 VIEW COMPARISON:  11/02/2019 FINDINGS: Normal heart size. Extensive, bilateral pulmonary opacities are identified which appear unchanged from previous exam. No pneumothorax. Subcutaneous emphysema is identified within the right neck. Etiology indeterminate. IMPRESSION: 1. No change in aeration a lungs compared with previous exam. 2. Right neck subcu emphysema noted. Electronically Signed   By: Kerby Moors M.D.   On: 11/04/2019 09:33   DG Chest Port 1 View  Result Date: 11/02/2019 CLINICAL DATA:  Shortness of breath, COVID positive EXAM: PORTABLE CHEST 1 VIEW COMPARISON:  11/01/2019 FINDINGS: Diffuse bilateral pulmonary opacities. Worsening left lung aeration. No pneumothorax. Pneumomediastinum is similar. Stable cardiomediastinal contours. No significant pleural effusion. IMPRESSION: Bilateral pneumonia with  worsening lung aeration on the left. Similar pneumomediastinum. Electronically Signed   By: Macy Mis M.D.   On: 11/02/2019 07:04   DG Chest Port 1 View  Result Date: 11/01/2019 CLINICAL DATA:  Acute respiratory failure EXAM: PORTABLE CHEST 1 VIEW COMPARISON:  Yesterday FINDINGS: Pneumomediastinum is newly seen along both mediastinal contours. No visible pneumothorax. Extensive bilateral pneumonia. Cardiomegaly and aortic tortuosity. These results will be called to the ordering clinician or representative by the Radiologist Assistant, and communication documented in the PACS or [employer]. IMPRESSION: 1. New pneumomediastinum. 2. Stable bilateral pneumonia.  No visible pneumothorax. Electronically Signed   By: Monte Fantasia M.D.   On: 11/01/2019 07:21   DG CHEST PORT 1 VIEW  Result Date: 10/31/2019 CLINICAL DATA:  Tachypnea EXAM: PORTABLE CHEST 1 VIEW COMPARISON:  10/31/2019, 10/30/2019, 10/28/2019 FINDINGS: Rotated patient. Fairly extensive bilateral ground-glass opacities and consolidations probably without significant change allowing for rotated patient. Enlarged cardiomediastinal silhouette also exaggerated by rotation. No pleural effusion or pneumothorax. IMPRESSION: Likely no significant interval change in fairly extensive bilateral airspace disease and consolidations allowing for patient rotation Electronically Signed   By: Donavan Foil M.D.   On: 10/31/2019 19:56   DG Chest [facility] 1 View  Result  Date: 10/31/2019 CLINICAL DATA:  Shortness of breath. COVID-19 viral pneumonia. EXAM: PORTABLE CHEST 1 VIEW COMPARISON:  02/21/2020 FINDINGS: Heart size remains within normal limits. Bilateral diffuse heterogeneous airspace opacity shows no significant change. No evidence of pneumothorax or pleural effusion. IMPRESSION: No significant change in diffuse heterogeneous airspace disease. Electronically Signed   By: Marlaine Hind M.D.   On: 10/31/2019 08:16   DG Chest Port 1 View  Result Date:  10/30/2019 CLINICAL DATA:  COVID positive. EXAM: PORTABLE CHEST 1 VIEW COMPARISON:  Chest x-ray dated October 28, 2019. FINDINGS: Stable cardiomediastinal silhouette. Normal pulmonary vascularity. Low lung volumes. Patchy interstitial and airspace opacities throughout both lungs, mildly worsened at the left lung base. No pleural effusion or pneumothorax. No acute osseous abnormality. IMPRESSION: 1. Multifocal pneumonia, mildly worsened at the left lung base. Electronically Signed   By: Titus Dubin M.D.   On: 10/30/2019 08:26   DG Chest Portable 1 View  Result Date: 10/28/2019 CLINICAL DATA:  Dyspnea with exertion. EXAM: PORTABLE CHEST 1 VIEW COMPARISON:  February 28, 2017. FINDINGS: Stable cardiomediastinal silhouette. Central pulmonary vascular congestion is noted. Bilateral lung opacities are noted which may represent edema or possibly multifocal pneumonia. Atherosclerosis of thoracic aorta is noted. No pneumothorax or pleural effusion is noted. Bony thorax is unremarkable. IMPRESSION: Aortic atherosclerosis. Central pulmonary vascular congestion is noted. Bilateral lung opacities are noted which may represent edema or possibly multifocal pneumonia. Aortic Atherosclerosis (ICD10-I70.0). Electronically Signed   By: Marijo Conception M.D.   On: 10/28/2019 15:42   ECHOCARDIOGRAM COMPLETE  Result Date: 10/29/2019    ECHOCARDIOGRAM REPORT   Patient Name:   BRELEE GARARD Date of Exam: 10/29/2019 Medical Rec #:  [id number]      Height:       61.0 in Accession #:    [id number]     Weight:       270.0 lb Date of Birth:  12-Jan-1946      BSA:          2.146 m Patient Age:    67 years       BP:           136/73 mmHg Patient Gender: F              HR:           135 bpm. Exam Location:  Inpatient Procedure: 2D Echo, Cardiac Doppler and Color Doppler Indications:    I48.0 Paroxysmal atrial fibrillation  History:        Patient has no prior history of Echocardiogram examinations.                 COVID-19 Positive.   Sonographer:    Jonelle Sidle Dance Referring Phys: [place]  1. Normal LV function; mild LAE.  2. Left ventricular ejection fraction, by estimation, is 55 to 60%. The left ventricle has normal function. The left ventricle has no regional wall motion abnormalities. Left ventricular diastolic function could not be evaluated.  3. Right ventricular systolic function is normal. The right ventricular size is normal. Tricuspid regurgitation signal is inadequate for assessing PA pressure.  4. Left atrial size was mildly dilated.  5. The mitral valve is normal in structure. Trivial mitral valve regurgitation. No evidence of mitral stenosis.  6. The aortic valve is tricuspid. Aortic valve regurgitation is not visualized. No aortic stenosis is present.  7. The inferior vena cava is normal in size with greater than 50% respiratory variability, suggesting right atrial  pressure of 3 mmHg. FINDINGS  Left Ventricle: Left ventricular ejection fraction, by estimation, is 55 to 60%. The left ventricle has normal function. The left ventricle has no regional wall motion abnormalities. The left ventricular internal cavity size was normal in size. There is  no left ventricular hypertrophy. Left ventricular diastolic function could not be evaluated due to atrial fibrillation. Left ventricular diastolic function could not be evaluated. Right Ventricle: The right ventricular size is normal. Right ventricular systolic function is normal. Tricuspid regurgitation signal is inadequate for assessing PA pressure. Left Atrium: Left atrial size was mildly dilated. Right Atrium: Right atrial size was normal in size. Pericardium: There is no evidence of pericardial effusion. Mitral Valve: The mitral valve is normal in structure. Normal mobility of the mitral valve leaflets. Trivial mitral valve regurgitation. No evidence of mitral valve stenosis. Tricuspid Valve: The tricuspid valve is normal in structure. Tricuspid valve  regurgitation is mild . No evidence of tricuspid stenosis. Aortic Valve: The aortic valve is tricuspid. Aortic valve regurgitation is not visualized. No aortic stenosis is present. Pulmonic Valve: The pulmonic valve was not well visualized. Pulmonic valve regurgitation is trivial. No evidence of pulmonic stenosis. Aorta: The aortic root is normal in size and structure. Venous: The inferior vena cava is normal in size with greater than 50% respiratory variability, suggesting right atrial pressure of 3 mmHg.  Additional Comments: Normal LV function; mild LAE.  LEFT VENTRICLE PLAX 2D LVIDd:         4.00 cm LVIDs:         2.85 cm LV PW:         1.10 cm LV IVS:        1.13 cm LVOT diam:     1.85 cm LV SV:         40 LV SV Index:   19 LVOT Area:     2.69 cm  RIGHT VENTRICLE             IVC RV Basal diam:  2.82 cm     IVC diam: 1.67 cm RV S prime:     12.20 cm/s TAPSE (M-mode): 0.9 cm LEFT ATRIUM           Index       RIGHT ATRIUM           Index LA diam:      3.20 cm 1.49 cm/m  RA Area:     15.80 cm LA Vol (A2C): 74.0 ml 34.49 ml/m RA Volume:   39.80 ml  18.55 ml/m LA Vol (A4C): 61.6 ml 28.71 ml/m  AORTIC VALVE LVOT Vmax:   122.50 cm/s LVOT Vmean:  66.600 cm/s LVOT VTI:    0.150 m  AORTA Ao Root diam: 3.30 cm Ao Asc diam:  3.20 cm MITRAL VALVE MV Area (PHT): 3.62 cm    SHUNTS MV Decel Time: 210 msec    Systemic VTI:  0.15 m MV E velocity: 53.35 cm/s  Systemic Diam: 1.85 cm Kirk Ruths MD Electronically signed by Kirk Ruths MD Signature Date/Time: 10/29/2019/2:08:19 PM    Final    VAS [place] LOWER EXTREMITY VENOUS (DVT)  Result Date: 11/03/2019  Lower Venous DVTStudy Indications: Swelling.  Risk Factors: COVID 19 positive. Limitations: Body habitus and poor ultrasound/tissue interface. Comparison Study: No prior studies. Performing Technologist: Oliver Hum RVT  Examination Guidelines: A complete evaluation includes B-mode imaging, spectral Doppler, color Doppler, and power Doppler as needed of all  accessible portions of each vessel. Bilateral testing is considered  an integral part of a complete examination. Limited examinations for reoccurring indications may be performed as noted. The reflux portion of the exam is performed with the patient in reverse Trendelenburg.  +---------+---------------+---------+-----------+----------+--------------+ RIGHT    CompressibilityPhasicitySpontaneityPropertiesThrombus Aging +---------+---------------+---------+-----------+----------+--------------+ CFV      Full           Yes      Yes                                 +---------+---------------+---------+-----------+----------+--------------+ SFJ      Full                                                        +---------+---------------+---------+-----------+----------+--------------+ FV Prox  Full                                                        +---------+---------------+---------+-----------+----------+--------------+ FV Mid   Full                                                        +---------+---------------+---------+-----------+----------+--------------+ FV DistalFull                                                        +---------+---------------+---------+-----------+----------+--------------+ PFV      Full                                                        +---------+---------------+---------+-----------+----------+--------------+ POP      Full           Yes      Yes                                 +---------+---------------+---------+-----------+----------+--------------+ PTV      Full                                                        +---------+---------------+---------+-----------+----------+--------------+ PERO     Full                                                        +---------+---------------+---------+-----------+----------+--------------+   +---------+---------------+---------+-----------+----------+--------------+  LEFT     CompressibilityPhasicitySpontaneityPropertiesThrombus Aging +---------+---------------+---------+-----------+----------+--------------+ CFV  Full           Yes      Yes                                 +---------+---------------+---------+-----------+----------+--------------+ SFJ      Full                                                        +---------+---------------+---------+-----------+----------+--------------+ FV Prox  Full                                                        +---------+---------------+---------+-----------+----------+--------------+ FV Mid   Full                                                        +---------+---------------+---------+-----------+----------+--------------+ FV DistalFull                                                        +---------+---------------+---------+-----------+----------+--------------+ PFV      Full                                                        +---------+---------------+---------+-----------+----------+--------------+ POP      Full           Yes      Yes                                 +---------+---------------+---------+-----------+----------+--------------+ PTV      Full                                                        +---------+---------------+---------+-----------+----------+--------------+ PERO     Full                                                        +---------+---------------+---------+-----------+----------+--------------+     Summary: RIGHT: - There is no evidence of deep vein thrombosis in the lower extremity. However, portions of this examination were limited- see technologist comments above.  - No cystic structure found in the popliteal fossa.  LEFT: - There is no evidence of deep vein thrombosis in the lower extremity. However, portions of this examination were limited-  see technologist comments above.  - No cystic structure found in the  popliteal fossa.  *See table(s) above for measurements and observations. Electronically signed by Monica Martinez MD on 11/03/2019 at 4:09:50 PM.    Final    [place] EKG SITE RITE  Result Date: 11/04/2019 If Site Rite image not attached, placement could not be confirmed due to current cardiac rhythm.    Phillips Climes M.D on 11/11/2019 at 12:24 PM  To page go to [email or web address] - password [email or web address]

## 2019-11-11 NOTE — Progress Notes (Signed)
Physical Therapy Treatment Patient Details Name: Julia Mcfarland MRN: [id number] DOB: 01-31-1946 Today's Date: 11/11/2019    History of Present Illness 74 year old female admitted 10/28/19 with fatigue, dyspnea, palpitations. She was found to be in Afib with RVR, HR 172 bpm. No structural heart abnormalities. Cardiology consulted. Diltiazem infusion with HR 130s-140s bpm. High-sensitivity troponin mildly elevated. Blood pressures soft. Patient also found to be COVID positive. CXR: pulmonary vascular congestion. No on IV amio and oral digoxin for rate control. On IV Heparin. Rapid response 10/29/19 PM but no interventions given. 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  Pt has had difficulty with nose bleeds, so bil nares are packed with gauze.      PT Comments    Pt improving significantly.  She was able to do a few laps around the room with RW and 4 L O2 venti mask.  Her nose is still packed with dressing due to nose bleed.  Min guard assist overall, and despite drop to 85% during gait, she rebounded to the 90s in <3 mins ans was able to walk and talk throughout.  Updated d/c recs to home.  Assisted pt in calling ICU to check on her husband who is still ventilated.  PT will continue to follow acutely for safe mobility progression.   Follow Up Recommendations  Home health PT;Supervision for mobility/OOB     Equipment Recommendations  Rolling walker with 5" wheels;3in1 (PT)(wide)    Recommendations for Other Services   NA     Precautions / Restrictions Precautions Precautions: Fall;Other (comment) Precaution Comments: monitor O2    Mobility  Bed Mobility               General bed mobility comments: Pt is OOB in the recliner chair.   Transfers Overall transfer level: Needs assistance Equipment used: Rolling walker (2 wheeled) Transfers: Sit to/from Stand Sit to Stand:  Min guard         General transfer comment: Min guard assist for safety  Ambulation/Gait Ambulation/Gait assistance: Min guard Gait Distance (Feet): 10 Feet(10'x1, 20'x2 with seated rest) Assistive device: Rolling walker (2 wheeled) Gait Pattern/deviations: Step-through pattern;Staggering left;Staggering right Gait velocity: decreased Gait velocity interpretation: 1.31 - 2.62 ft/sec, indicative of limited community ambulator General Gait Details: Pt's O2 sats decreased to 85% on 4L venti mask.  Increased back to 90s with seated rest in <3 mins.            Balance Overall balance assessment: Needs assistance Sitting-balance support: Feet supported;No upper extremity supported Sitting balance-Leahy Scale: Good     Standing balance support: Bilateral upper extremity supported Standing balance-Leahy Scale: Poor Standing balance comment: needs external support from PT and RW                            Cognition Arousal/Alertness: Awake/alert Behavior During Therapy: WFL for tasks assessed/performed Overall Cognitive Status: Within Functional Limits for tasks assessed                                        Exercises Other Exercises Other Exercises: IS x 10 reps max inspired volume 750 mL        Pertinent Vitals/Pain Pain Assessment: No/denies pain           PT Goals (current goals can  now be found in the care plan section) Acute Rehab PT Goals Patient Stated Goal: to get back home to her family when she can.   Progress towards PT goals: Progressing toward goals    Frequency    Min 3X/week      PT Plan Discharge plan needs to be updated       AM-PAC PT "6 Clicks" Mobility   Outcome Measure  Help needed turning from your back to your side while in a flat bed without using bedrails?: A Little Help needed moving from lying on your back to sitting on the side of a flat bed without using bedrails?: A Little Help needed moving to  and from a bed to a chair (including a wheelchair)?: A Little Help needed standing up from a chair using your arms (e.g., wheelchair or bedside chair)?: A Little Help needed to walk in hospital room?: A Little Help needed climbing 3-5 steps with a railing? : A Lot 6 Click Score: 17    End of Session Equipment Utilized During Treatment: Oxygen Activity Tolerance: Patient limited by fatigue;Patient tolerated treatment well Patient left: in chair;with call bell/phone within reach;with chair alarm set Nurse Communication: Mobility status;Other (comment)(O2 sats) PT Visit Diagnosis: Unsteadiness on feet (R26.81);Other abnormalities of gait and mobility (R26.89)     Time: [id number] PT Time Calculation (min) (ACUTE ONLY): 44 min  Charges:  $Gait Training: 38-52 mins                    Verdene Lennert, PT, DPT  Acute Rehabilitation [phone] pager #[phone] office     11/11/2019, 3:37 PM

## 2019-11-12 LAB — GLUCOSE, CAPILLARY
Glucose-Capillary: 79 mg/dL (ref 70–99)
Glucose-Capillary: 96 mg/dL (ref 70–99)
Glucose-Capillary: 141 mg/dL — ABNORMAL HIGH (ref 70–99)

## 2019-11-12 LAB — BASIC METABOLIC PANEL
Anion gap: 7 (ref 5–15)
BUN: 11 mg/dL (ref 8–23)
CO2: 29 mmol/L (ref 22–32)
Calcium: 8.6 mg/dL — ABNORMAL LOW (ref 8.9–10.3)
Chloride: 108 mmol/L (ref 98–111)
Creatinine, Ser: 1.03 mg/dL — ABNORMAL HIGH (ref 0.44–1.00)
GFR calc Af Amer: >60 mL/min (ref >60)
GFR calc non Af Amer: 54 mL/min — ABNORMAL LOW (ref >60)
Glucose, Bld: 91 mg/dL (ref 70–99)
Potassium: 3.9 mmol/L (ref 3.5–5.1)
Sodium: 144 mmol/L (ref 135–145)

## 2019-11-12 LAB — CBC
HCT: 33.7 % — ABNORMAL LOW (ref 36.0–46.0)
Hemoglobin: 10.7 g/dL — ABNORMAL LOW (ref 12.0–15.0)
MCH: 31.2 pg (ref 26.0–34.0)
MCHC: 31.8 g/dL (ref 30.0–36.0)
MCV: 98.3 fL (ref 80.0–100.0)
Platelets: 208 10*3/uL (ref 150–400)
RBC: 3.43 MIL/uL — ABNORMAL LOW (ref 3.87–5.11)
RDW: 14.3 % (ref 11.5–15.5)
WBC: 5.9 10*3/uL (ref 4.0–10.5)
nRBC: 0 % (ref 0.0–0.2)

## 2019-11-12 MED ORDER — ENOXAPARIN SODIUM 100 MG/ML ~~LOC~~ SOLN
100.0000 mg | Freq: Two times a day (BID) | SUBCUTANEOUS | Status: DC
  Administered 2019-11-12 – 2019-11-13 (×2): 100 mg via SUBCUTANEOUS
  Filled 2019-11-12 (×2): qty 1

## 2019-11-12 NOTE — Progress Notes (Signed)
Physical Therapy Treatment Patient Details Name: Julia Mcfarland MRN: [id number] DOB: 28-Jan-1946 Today's Date: 11/12/2019    History of Present Illness 74 year old female admitted 10/28/19 with fatigue, dyspnea, palpitations. She was found to be in Afib with RVR, HR 172 bpm. No structural heart abnormalities. Cardiology consulted. Diltiazem infusion with HR 130s-140s bpm. High-sensitivity troponin mildly elevated. Blood pressures soft. Patient also found to be COVID positive. CXR: pulmonary vascular congestion. No on IV amio and oral digoxin for rate control. On IV Heparin. Rapid response 10/29/19 PM but no interventions given. 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  Pt has had difficulty with nose bleeds, so bil nares are packed with gauze.      PT Comments    Pt received in recliner on RA, SpO2 97%. Pt reports nasal packing (due to nose bleed) was removed this AM. She required min guard assist transfers and ambulation 25' with RW x 3 trials. Distance limited by cardiopulmonary status. Desat to 80% on RA all 3 trials. Quick/near immediate rebound to 91% after sitting in recliner. SpO2 100% on RA in recliner at end of session.    Follow Up Recommendations  Home health PT;Supervision for mobility/OOB     Equipment Recommendations  Rolling walker with 5" wheels;3in1 (PT)(wide)    Recommendations for Other Services       Precautions / Restrictions Precautions Precautions: Fall;Other (comment) Precaution Comments: monitor O2    Mobility  Bed Mobility               General bed mobility comments: OOB in recliner  Transfers Overall transfer level: Needs assistance Equipment used: Rolling walker (2 wheeled) Transfers: Sit to/from [facility] Sit to Stand: Min guard Stand pivot transfers: Min guard       General transfer comment: Min guard assist for  safety  Ambulation/Gait Ambulation/Gait assistance: Min guard Gait Distance (Feet): 25 Feet(x 3) Assistive device: Rolling walker (2 wheeled) Gait Pattern/deviations: Step-through pattern;Decreased stride length Gait velocity: decreased Gait velocity interpretation: 1.31 - 2.62 ft/sec, indicative of limited community ambulator General Gait Details: Pt on RA on arrival. Packing has been removed from her nose. SpO2 97-100%. Ambulated 3 trials in room on RA with desat to 80%. Quick rebound to 91% upon seated rest break. Mild SOB noted. Pt able to hold conversation during mobility. SpO2 100% in recliner at end of session.   Stairs             Wheelchair Mobility    Modified Rankin (Stroke Patients Only)       Balance Overall balance assessment: Needs assistance Sitting-balance support: Feet supported;No upper extremity supported Sitting balance-Leahy Scale: Good     Standing balance support: Bilateral upper extremity supported;During functional activity Standing balance-Leahy Scale: Poor Standing balance comment: reliant on external support                            Cognition Arousal/Alertness: Awake/alert Behavior During Therapy: WFL for tasks assessed/performed Overall Cognitive Status: Within Functional Limits for tasks assessed                                        Exercises      General Comments General comments (skin integrity, edema, etc.): SpO2 97-100% on RA at rest. Packing removed from nose this  AM.      Pertinent Vitals/Pain Pain Assessment: No/denies pain    Home Living                      Prior Function            PT Goals (current goals can now be found in the care plan section) Acute Rehab PT Goals Patient Stated Goal: home Progress towards PT goals: Progressing toward goals    Frequency    Min 3X/week      PT Plan Current plan remains appropriate    Co-evaluation              AM-PAC  PT "6 Clicks" Mobility   Outcome Measure  Help needed turning from your back to your side while in a flat bed without using bedrails?: A Little Help needed moving from lying on your back to sitting on the side of a flat bed without using bedrails?: A Little Help needed moving to and from a bed to a chair (including a wheelchair)?: A Little Help needed standing up from a chair using your arms (e.g., wheelchair or bedside chair)?: A Little Help needed to walk in hospital room?: A Little Help needed climbing 3-5 steps with a railing? : A Lot 6 Click Score: 17    End of Session Equipment Utilized During Treatment: Gait belt Activity Tolerance: Patient tolerated treatment well Patient left: in chair;with call bell/phone within reach Nurse Communication: Mobility status PT Visit Diagnosis: Unsteadiness on feet (R26.81);Other abnormalities of gait and mobility (R26.89)     Time: 1024-1040 PT Time Calculation (min) (ACUTE ONLY): 16 min  Charges:  $Gait Training: 8-22 mins                     Lorrin Goodell, PT  Office # [phone] Pager [phone].   Julia Mcfarland 11/12/2019, 12:29 PM

## 2019-11-12 NOTE — Progress Notes (Signed)
PROGRESS NOTE                                                                                                                                                                                                             [name], is a 74 y.o. female, DOB - 09-27-1945, [id number]  Admit date - 10/28/2019   Admitting Physician No admitting provider for patient encounter.  Outpatient Primary MD for the patient is Garnette Gunner Coralie Keens, NP  LOS - 15  Chief Complaint  Patient presents with  . Tachycardia       Brief Narrative    Julia Mcfarland is a 74 y.o. female with medical history significant of chronic venous insufficiency, arthritis, and morbid obesity presented complaints of palpitations, generalized malaise, and shortness of breath over the last 3 days.  Associated symptoms included chills, generalized weakness, and a nonproductive cough.  Upon admission she was found to be in A. fib with RVR with heart rates into the 170s.    She was admitted by cardiology team on 26 April subsequently found to have COVID-19 pneumonia and hospitalist team was consulted.  She had by that time developed severe parenchymal injury with severe hypoxia, briefly was in ICU for heated high flow back to progressive care unit with heated high flow she did develop pneumomediastinum as well, her oxygen requirement has improved, she did develop epistaxis on anticoagulation, which she required [employer], she converted to normal sinus rhythm, and epistaxis seems to be controlled, her hypoxia has improved.    Subjective:   Patient in bed, Rhino Rocket's were discontinued this morning, so far no recurrence of her epistaxis , dyspnea has improved, tolerating room air at rest .   Assessment  & Plan :      Acute hypoxic respiratory failure due to [id number] pneumonia complicated by pneumomediastinum.   - She was initially treated for the first 2 to 3 days of hospitalization with  IV steroids and remdesivir, however she continued to decline, with significant increase of oxygen requirement, which she required heated high flow nasal cannula , patient did require 30 L heated high flow nasal cannula yesterday, her hypoxia was slowly improved, this morning she is tolerating room air at rest, she still some oxygen requirement with minimal activity . -She received Actemra . -Use incentive spirometry and flutter valve . -Overall she remains very tenuous, continue to monitor her progressive care . - No signs of bacterial infection or esophageal  rupture.  No chest or abdominal pain.  Gradually improving continue to monitor. -She did develop pneumomediastinum, she denies any chest pain today.  SpO2: 100 % O2 Flow Rate (L/min): 4 L/min FiO2 (%): 40 %    Recent Labs  Lab 11/06/19 0546 11/06/19 0546 11/07/19 0258 11/07/19 0258 11/08/19 0204 11/09/19 0340 11/10/19 1508 11/11/19 0328 11/12/19 0254  NA 140   < > 138   < > 141 141 141 142 144  K 3.6   < > 3.4*   < > 4.0 3.6 3.8 3.5 3.9  CL 99   < > 98   < > 99 100 101 103 108  CO2 32   < > 31   < > 32 32 30 31 29   GLUCOSE 111*   < > 95   < > 91 89 137* 108* 91  BUN 20   < > 18   < > 17 14 10 14 11   CREATININE 1.29*   < > 1.24*   < > 1.23* 1.14* 1.22* 1.24* 1.03*  CALCIUM 8.3*   < > 8.4*   < > 8.5* 8.6* 8.7* 8.6* 8.6*  AST 31  --  31  --   --   --   --   --   --   ALT 24  --  28  --   --   --   --   --   --   ALKPHOS 55  --  53  --   --   --   --   --   --   BILITOT 1.3*  --  1.1  --   --   --   --   --   --   ALBUMIN 2.4*  --  2.5*  --   --   --   --   --   --   MG 2.2  --  2.0  --   --   --   --   --   --   CRP 0.6  --  0.6  --   --   --   --   --   --   DDIMER 3.05*  --  2.24*  --   --   --   --   --   --   PROCALCITON <0.10  --   --   --   --   --   --   --   --   BNP 45.5  --   --   --   --   --   --   --   --    < > = values in this interval not displayed.    Recent Labs  Lab 11/06/19 0546 11/07/19 0258    CRP 0.6 0.6  DDIMER 3.05* 2.24*  BNP 45.5  --   PROCALCITON <0.10  --       Lab Results  Component Value Date   TSH 0.725 10/28/2019    Paroxysmal A. fib RVR.  -  Mali vas 2 score of greater than 3.  Cardiology on board, stable TSH and echocardiogram, initially required IV amiodarone and digoxin, currently transition to oral amiodarone, oral Cardizem dose increased on 11/04/2019 for better control. -Per cardiology recommendation, continue with Cardizem 90 mg every 8 hours, and consulted.  To discharge, as well decrease amiodarone 200 mg twice daily to once daily prior to discharge . -She did convert to normal sinus rhythm 5/8, blood pressure remains  low, stopped her Cardizem given soft blood pressure. -Continue with full anticoagulation dose Lovenox (has been held temporarily due to epistaxis), can be transitioned to Eliquis in the next 1 to 2 days as long she remains with no further epistaxis after discontinuing her Rhino Rocket this morning. -She is on midodrine for soft blood pressure , I have tapered her dose gradually, now blood pressure is acceptable I will go ahead and discontinue midodrine .\ -Keep potassium> 4, magnesium> 2  Epistaxis -This is most likely due to prolonged high flow nasal cannula use, she had significant epistaxis 5/7 and 5/8 AM, required bilateral lateral Rhino Rocket insertion 5/9, her bleeding has stopped, so I have discontinued her Rhino Rocket this morning, will continue to monitor closely as she is back on subcu Lovenox. - on doxycycline given her Rhino Rocket.  Can be stopped in 24 hours.  Obesity with BMI of 51.   - Follow with PCP for weight loss.   AKI.   - Clinically now appears dehydrated, gentle IV fluids and monitor.   Metabolic encephalopathy.  -  Was due to hypoxia completely resolved after heated high flow, p.m. Seroquel.    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  Hypokalemia.   -Repleted  Rising D-dimer despite being on Eliquis.  This is likely due to inflammation from COVID-19, leg ultrasound is negative.   Hypotension. -On midodrine, blood pressure has improved especially after stopping her Cardizem, I would just continue Midodrin .  DM type II.  Lantus plus sliding scale monitor and adjust.    Lab Results  Component Value Date   HGBA1C 6.3 (H) 10/28/2019   CBG (last 3)  Recent Labs    11/11/19 2130 11/12/19 0740 11/12/19 1131  GLUCAP 112* 40 96      Family Communication  :  -Husband  is currently hospitalized on 22M, her daughter was discharged yesterday, I left her son a Advertising account executive today .  Code Status :  Full  Disposition Plan  : Patient is not medically stable for discharge, she remains in significant oxygen requirement.  Consults  : Cards  Procedures  :    Leg [place] - No DVT  TTE -  1. Normal LV function; mild LAE.  2. Left ventricular ejection fraction, by estimation, is 55 to 60%. The left ventricle has normal function. The left ventricle has no regional wall motion abnormalities. Left ventricular diastolic function could not be evaluated.  3. Right ventricular systolic function is normal. The right ventricular size is normal. Tricuspid regurgitation signal is inadequate for assessing PA pressure.  4. Left atrial size was mildly dilated.  5. The mitral valve is normal in structure. Trivial mitral valve regurgitation. No evidence of mitral stenosis.  6. The aortic valve is tricuspid. Aortic valve regurgitation is not visualized. No aortic stenosis is present.  7. The inferior vena cava is normal in size with greater than 50% respiratory variability, suggesting right atrial pressure of 3 mmHg.  DVT Prophylaxis  :  Lovenox  Lab Results  Component Value Date   PLT 208 11/12/2019    Diet :  Diet Order             DIET SOFT Room service appropriate? Yes; Fluid consistency: Thin  Diet effective now  Inpatient Medications Scheduled Meds: . amiodarone  200 mg Oral BID  . Chlorhexidine Gluconate Cloth  6 each Topical Daily  . doxycycline  100 mg Oral Q12H  . enoxaparin (LOVENOX) injection  120 mg Subcutaneous Q12H  . insulin aspart  0-20 Units Subcutaneous TID WC  . insulin detemir  15 Units Subcutaneous Daily  . midodrine  5 mg Oral TID WC  . neomycin-bacitracin-polymyxin   Topical Daily  . pantoprazole  40 mg Oral Daily  . QUEtiapine  25 mg Oral QHS  . senna-docusate  2 tablet Oral BID   Continuous Infusions:  PRN Meds:.acetaminophen, albuterol, metoprolol tartrate, ondansetron (ZOFRAN) IV, sodium chloride, sodium chloride flush  Antibiotics  :   Anti-infectives (From admission, onward)   Start     Dose/Rate Route Frequency Ordered Stop   11/09/19 1630  doxycycline (VIBRA-TABS) tablet 100 mg     100 mg Oral Every 12 hours 11/09/19 1615     10/30/19 1000  remdesivir 100 mg in sodium chloride 0.9 % 100 mL IVPB     100 mg 200 mL/hr over 30 Minutes Intravenous Daily 10/29/19 0914 11/02/19 0956   10/29/19 1000  remdesivir 200 mg in sodium chloride 0.9% 250 mL IVPB     200 mg 580 mL/hr over 30 Minutes Intravenous Once 10/29/19 0914 10/29/19 1236         Objective:   Vitals:   11/12/19 0744 11/12/19 0826 11/12/19 1136 11/12/19 1141  BP: 102/65  94/66 (!) 97/57  Pulse: 64  74 70  Resp: 19  (!) 21 20  Temp: 98.6 F (37 C)  98.6 F (37 C) 98.2 F (36.8 C)  TempSrc: Oral  Oral Oral  SpO2: 96% 96% 96% 100%  Weight:      Height:        SpO2: 100 % O2 Flow Rate (L/min): 4 L/min FiO2 (%): 40 %  Wt Readings from Last 3 Encounters:  11/12/19 124 kg  09/19/19 132 kg  02/18/19 127.9 kg    No intake or output data in the 24 hours ending 11/12/19 1238   Physical Exam  Awake Alert, Oriented X 3, No new F.N deficits, Normal affect Symmetrical Chest wall  movement, Good air movement bilaterally, CTAB RRR,No Gallops,Rubs or new Murmurs, No Parasternal Heave +ve B.Sounds, Abd Soft, No tenderness, No rebound - guarding or rigidity. No Cyanosis, Clubbing or edema, No new Rash or bruise        Data Review:    Recent Labs  Lab 11/08/19 0204 11/09/19 0340 11/10/19 1508 11/11/19 0328 11/12/19 0254  WBC 5.9 7.9 6.2 6.3 5.9  HGB 12.9 11.6* 11.3* 10.7* 10.7*  HCT 41.2 37.3 36.4 34.5* 33.7*  PLT 335 279 250 230 208  MCV 96.0 96.4 97.3 98.0 98.3  MCH 30.1 30.0 30.2 30.4 31.2  MCHC 31.3 31.1 31.0 31.0 31.8  RDW 13.4 13.5 13.8 13.9 14.3    Recent Labs  Lab 11/06/19 0546 11/06/19 0546 11/07/19 0258 11/07/19 0258 11/08/19 0204 11/09/19 0340 11/10/19 1508 11/11/19 0328 11/12/19 0254  NA 140   < > 138   < > 141 141 141 142 144  K 3.6   < > 3.4*   < > 4.0 3.6 3.8 3.5 3.9  CL 99   < > 98   < > 99 100 101 103 108  CO2 32   < > 31   < > 32 32 30 31 29   GLUCOSE 111*   < > 95   < >  91 89 137* 108* 91  BUN 20   < > 18   < > 17 14 10 14 11   CREATININE 1.29*   < > 1.24*   < > 1.23* 1.14* 1.22* 1.24* 1.03*  CALCIUM 8.3*   < > 8.4*   < > 8.5* 8.6* 8.7* 8.6* 8.6*  AST 31  --  31  --   --   --   --   --   --   ALT 24  --  28  --   --   --   --   --   --   ALKPHOS 55  --  53  --   --   --   --   --   --   BILITOT 1.3*  --  1.1  --   --   --   --   --   --   ALBUMIN 2.4*  --  2.5*  --   --   --   --   --   --   MG 2.2  --  2.0  --   --   --   --   --   --   CRP 0.6  --  0.6  --   --   --   --   --   --   DDIMER 3.05*  --  2.24*  --   --   --   --   --   --   PROCALCITON <0.10  --   --   --   --   --   --   --   --   BNP 45.5  --   --   --   --   --   --   --   --    < > = values in this interval not displayed.    Recent Labs  Lab 11/06/19 0546 11/07/19 0258  CRP 0.6 0.6  DDIMER 3.05* 2.24*  BNP 45.5  --   PROCALCITON <0.10  --       ------------------------------------------------------------------------------------------------------------------ No results for input(s): CHOL, HDL, LDLCALC, TRIG, CHOLHDL, LDLDIRECT in the last 72 hours.  Lab Results  Component Value Date   HGBA1C 6.3 (H) 10/28/2019   ------------------------------------------------------------------------------------------------------------------ No results for input(s): TSH, T4TOTAL, T3FREE, THYROIDAB in the last 72 hours.  Invalid input(s): FREET3 ------------------------------------------------------------------------------------------------------------------ No results for input(s): VITAMINB12, FOLATE, FERRITIN, TIBC, IRON, RETICCTPCT in the last 72 hours.  Coagulation profile No results for input(s): INR, PROTIME in the last 168 hours.  No results for input(s): DDIMER in the last 72 hours.  Cardiac Enzymes No results for input(s): CKMB, TROPONINI, MYOGLOBIN in the last 168 hours.  Invalid input(s): CK ------------------------------------------------------------------------------------------------------------------    Component Value Date/Time   BNP 45.5 11/06/2019 0546    Micro Results No results found for this or any previous visit (from the past 240 hour(s)).  Radiology Reports DG Chest Port 1 View  Result Date: 11/05/2019 CLINICAL DATA:  Shortness of breath EXAM: PORTABLE CHEST 1 VIEW COMPARISON:  Nov 04, 2019 and Nov 02, 2019 FINDINGS: Central catheter tip is in the superior vena cava. No pneumothorax. Note that there is subcutaneous air in the supraclavicular regions, stable. There is also a degree of pneumomediastinum, less pronounced than on study from 3 days prior. There is airspace opacity bilaterally with consolidation in the right lower lobe. No new opacity evident. Heart size and pulmonary vascularity are normal. No adenopathy. There is degenerative change in thoracic  spine. IMPRESSION: Multifocal airspace opacity with  consolidation right base, similar to recent studies. Stable cardiac silhouette. Central catheter tip in superior vena cava. No pneumothorax. However, there is pneumomediastinum as well as subcutaneous air in the supraclavicular regions, more on the right than the left. Electronically Signed   By: Lowella Grip III M.D.   On: 11/05/2019 09:11   DG Chest Port 1 View  Result Date: 11/04/2019 CLINICAL DATA:  PICC line placement EXAM: PORTABLE CHEST 1 VIEW COMPARISON:  11/04/2019 FINDINGS: Right PICC line is been placed. The tip is at the cavoatrial junction. Stable diffuse bilateral airspace disease. No visible effusion or pneumothorax. Subcutaneous emphysema again noted in the right neck base, stable. No acute bony abnormality. IMPRESSION: Right PICC line tip at the cavoatrial junction. Stable bilateral airspace disease. Electronically Signed   By: Rolm Baptise M.D.   On: 11/04/2019 18:46   DG Chest Port 1 View  Result Date: 11/04/2019 CLINICAL DATA:  Short of breath. EXAM: PORTABLE CHEST 1 VIEW COMPARISON:  11/02/2019 FINDINGS: Normal heart size. Extensive, bilateral pulmonary opacities are identified which appear unchanged from previous exam. No pneumothorax. Subcutaneous emphysema is identified within the right neck. Etiology indeterminate. IMPRESSION: 1. No change in aeration a lungs compared with previous exam. 2. Right neck subcu emphysema noted. Electronically Signed   By: Kerby Moors M.D.   On: 11/04/2019 09:33   DG Chest Port 1 View  Result Date: 11/02/2019 CLINICAL DATA:  Shortness of breath, COVID positive EXAM: PORTABLE CHEST 1 VIEW COMPARISON:  11/01/2019 FINDINGS: Diffuse bilateral pulmonary opacities. Worsening left lung aeration. No pneumothorax. Pneumomediastinum is similar. Stable cardiomediastinal contours. No significant pleural effusion. IMPRESSION: Bilateral pneumonia with worsening lung aeration on the left. Similar pneumomediastinum. Electronically Signed   By: Macy Mis  M.D.   On: 11/02/2019 07:04   DG Chest Port 1 View  Result Date: 11/01/2019 CLINICAL DATA:  Acute respiratory failure EXAM: PORTABLE CHEST 1 VIEW COMPARISON:  Yesterday FINDINGS: Pneumomediastinum is newly seen along both mediastinal contours. No visible pneumothorax. Extensive bilateral pneumonia. Cardiomegaly and aortic tortuosity. These results will be called to the ordering clinician or representative by the Radiologist Assistant, and communication documented in the PACS or [employer]. IMPRESSION: 1. New pneumomediastinum. 2. Stable bilateral pneumonia.  No visible pneumothorax. Electronically Signed   By: Monte Fantasia M.D.   On: 11/01/2019 07:21   DG CHEST PORT 1 VIEW  Result Date: 10/31/2019 CLINICAL DATA:  Tachypnea EXAM: PORTABLE CHEST 1 VIEW COMPARISON:  10/31/2019, 10/30/2019, 10/28/2019 FINDINGS: Rotated patient. Fairly extensive bilateral ground-glass opacities and consolidations probably without significant change allowing for rotated patient. Enlarged cardiomediastinal silhouette also exaggerated by rotation. No pleural effusion or pneumothorax. IMPRESSION: Likely no significant interval change in fairly extensive bilateral airspace disease and consolidations allowing for patient rotation Electronically Signed   By: Donavan Foil M.D.   On: 10/31/2019 19:56   DG Chest Port 1 View  Result Date: 10/31/2019 CLINICAL DATA:  Shortness of breath. COVID-19 viral pneumonia. EXAM: PORTABLE CHEST 1 VIEW COMPARISON:  02/21/2020 FINDINGS: Heart size remains within normal limits. Bilateral diffuse heterogeneous airspace opacity shows no significant change. No evidence of pneumothorax or pleural effusion. IMPRESSION: No significant change in diffuse heterogeneous airspace disease. Electronically Signed   By: Marlaine Hind M.D.   On: 10/31/2019 08:16   DG Chest Port 1 View  Result Date: 10/30/2019 CLINICAL DATA:  COVID positive. EXAM: PORTABLE CHEST 1 VIEW COMPARISON:  Chest x-ray dated October 28, 2019. FINDINGS: Stable cardiomediastinal silhouette.  Normal pulmonary vascularity. Low lung volumes. Patchy interstitial and airspace opacities throughout both lungs, mildly worsened at the left lung base. No pleural effusion or pneumothorax. No acute osseous abnormality. IMPRESSION: 1. Multifocal pneumonia, mildly worsened at the left lung base. Electronically Signed   By: Titus Dubin M.D.   On: 10/30/2019 08:26   DG Chest Portable 1 View  Result Date: 10/28/2019 CLINICAL DATA:  Dyspnea with exertion. EXAM: PORTABLE CHEST 1 VIEW COMPARISON:  February 28, 2017. FINDINGS: Stable cardiomediastinal silhouette. Central pulmonary vascular congestion is noted. Bilateral lung opacities are noted which may represent edema or possibly multifocal pneumonia. Atherosclerosis of thoracic aorta is noted. No pneumothorax or pleural effusion is noted. Bony thorax is unremarkable. IMPRESSION: Aortic atherosclerosis. Central pulmonary vascular congestion is noted. Bilateral lung opacities are noted which may represent edema or possibly multifocal pneumonia. Aortic Atherosclerosis (ICD10-I70.0). Electronically Signed   By: Marijo Conception M.D.   On: 10/28/2019 15:42   ECHOCARDIOGRAM COMPLETE  Result Date: 10/29/2019    ECHOCARDIOGRAM REPORT   Patient Name:   Julia Mcfarland Date of Exam: 10/29/2019 Medical Rec #:  [id number]      Height:       61.0 in Accession #:    [id number]     Weight:       270.0 lb Date of Birth:  June 04, 1946      BSA:          2.146 m Patient Age:    3 years       BP:           136/73 mmHg Patient Gender: F              HR:           135 bpm. Exam Location:  Inpatient Procedure: 2D Echo, Cardiac Doppler and Color Doppler Indications:    I48.0 Paroxysmal atrial fibrillation  History:        Patient has no prior history of Echocardiogram examinations.                 COVID-19 Positive.  Sonographer:    Jonelle Sidle Dance Referring Phys: [place]  1. Normal LV function; mild  LAE.  2. Left ventricular ejection fraction, by estimation, is 55 to 60%. The left ventricle has normal function. The left ventricle has no regional wall motion abnormalities. Left ventricular diastolic function could not be evaluated.  3. Right ventricular systolic function is normal. The right ventricular size is normal. Tricuspid regurgitation signal is inadequate for assessing PA pressure.  4. Left atrial size was mildly dilated.  5. The mitral valve is normal in structure. Trivial mitral valve regurgitation. No evidence of mitral stenosis.  6. The aortic valve is tricuspid. Aortic valve regurgitation is not visualized. No aortic stenosis is present.  7. The inferior vena cava is normal in size with greater than 50% respiratory variability, suggesting right atrial pressure of 3 mmHg. FINDINGS  Left Ventricle: Left ventricular ejection fraction, by estimation, is 55 to 60%. The left ventricle has normal function. The left ventricle has no regional wall motion abnormalities. The left ventricular internal cavity size was normal in size. There is  no left ventricular hypertrophy. Left ventricular diastolic function could not be evaluated due to atrial fibrillation. Left ventricular diastolic function could not be evaluated. Right Ventricle: The right ventricular size is normal. Right ventricular systolic function is normal. Tricuspid regurgitation signal is inadequate for assessing PA pressure. Left Atrium: Left atrial size was  mildly dilated. Right Atrium: Right atrial size was normal in size. Pericardium: There is no evidence of pericardial effusion. Mitral Valve: The mitral valve is normal in structure. Normal mobility of the mitral valve leaflets. Trivial mitral valve regurgitation. No evidence of mitral valve stenosis. Tricuspid Valve: The tricuspid valve is normal in structure. Tricuspid valve regurgitation is mild . No evidence of tricuspid stenosis. Aortic Valve: The aortic valve is tricuspid. Aortic valve  regurgitation is not visualized. No aortic stenosis is present. Pulmonic Valve: The pulmonic valve was not well visualized. Pulmonic valve regurgitation is trivial. No evidence of pulmonic stenosis. Aorta: The aortic root is normal in size and structure. Venous: The inferior vena cava is normal in size with greater than 50% respiratory variability, suggesting right atrial pressure of 3 mmHg.  Additional Comments: Normal LV function; mild LAE.  LEFT VENTRICLE PLAX 2D LVIDd:         4.00 cm LVIDs:         2.85 cm LV PW:         1.10 cm LV IVS:        1.13 cm LVOT diam:     1.85 cm LV SV:         40 LV SV Index:   19 LVOT Area:     2.69 cm  RIGHT VENTRICLE             IVC RV Basal diam:  2.82 cm     IVC diam: 1.67 cm RV S prime:     12.20 cm/s TAPSE (M-mode): 0.9 cm LEFT ATRIUM           Index       RIGHT ATRIUM           Index LA diam:      3.20 cm 1.49 cm/m  RA Area:     15.80 cm LA Vol (A2C): 74.0 ml 34.49 ml/m RA Volume:   39.80 ml  18.55 ml/m LA Vol (A4C): 61.6 ml 28.71 ml/m  AORTIC VALVE LVOT Vmax:   122.50 cm/s LVOT Vmean:  66.600 cm/s LVOT VTI:    0.150 m  AORTA Ao Root diam: 3.30 cm Ao Asc diam:  3.20 cm MITRAL VALVE MV Area (PHT): 3.62 cm    SHUNTS MV Decel Time: 210 msec    Systemic VTI:  0.15 m MV E velocity: 53.35 cm/s  Systemic Diam: 1.85 cm Kirk Ruths MD Electronically signed by Kirk Ruths MD Signature Date/Time: 10/29/2019/2:08:19 PM    Final    VAS [place] LOWER EXTREMITY VENOUS (DVT)  Result Date: 11/03/2019  Lower Venous DVTStudy Indications: Swelling.  Risk Factors: COVID 19 positive. Limitations: Body habitus and poor ultrasound/tissue interface. Comparison Study: No prior studies. Performing Technologist: Oliver Hum RVT  Examination Guidelines: A complete evaluation includes B-mode imaging, spectral Doppler, color Doppler, and power Doppler as needed of all accessible portions of each vessel. Bilateral testing is considered an integral part of a complete examination. Limited  examinations for reoccurring indications may be performed as noted. The reflux portion of the exam is performed with the patient in reverse Trendelenburg.  +---------+---------------+---------+-----------+----------+--------------+ RIGHT    CompressibilityPhasicitySpontaneityPropertiesThrombus Aging +---------+---------------+---------+-----------+----------+--------------+ CFV      Full           Yes      Yes                                 +---------+---------------+---------+-----------+----------+--------------+ SFJ  Full                                                        +---------+---------------+---------+-----------+----------+--------------+ FV Prox  Full                                                        +---------+---------------+---------+-----------+----------+--------------+ FV Mid   Full                                                        +---------+---------------+---------+-----------+----------+--------------+ FV DistalFull                                                        +---------+---------------+---------+-----------+----------+--------------+ PFV      Full                                                        +---------+---------------+---------+-----------+----------+--------------+ POP      Full           Yes      Yes                                 +---------+---------------+---------+-----------+----------+--------------+ PTV      Full                                                        +---------+---------------+---------+-----------+----------+--------------+ PERO     Full                                                        +---------+---------------+---------+-----------+----------+--------------+   +---------+---------------+---------+-----------+----------+--------------+ LEFT     CompressibilityPhasicitySpontaneityPropertiesThrombus Aging  +---------+---------------+---------+-----------+----------+--------------+ CFV      Full           Yes      Yes                                 +---------+---------------+---------+-----------+----------+--------------+ SFJ      Full                                                        +---------+---------------+---------+-----------+----------+--------------+  FV Prox  Full                                                        +---------+---------------+---------+-----------+----------+--------------+ FV Mid   Full                                                        +---------+---------------+---------+-----------+----------+--------------+ FV DistalFull                                                        +---------+---------------+---------+-----------+----------+--------------+ PFV      Full                                                        +---------+---------------+---------+-----------+----------+--------------+ POP      Full           Yes      Yes                                 +---------+---------------+---------+-----------+----------+--------------+ PTV      Full                                                        +---------+---------------+---------+-----------+----------+--------------+ PERO     Full                                                        +---------+---------------+---------+-----------+----------+--------------+     Summary: RIGHT: - There is no evidence of deep vein thrombosis in the lower extremity. However, portions of this examination were limited- see technologist comments above.  - No cystic structure found in the popliteal fossa.  LEFT: - There is no evidence of deep vein thrombosis in the lower extremity. However, portions of this examination were limited- see technologist comments above.  - No cystic structure found in the popliteal fossa.  *See table(s) above for measurements and observations.  Electronically signed by Monica Martinez MD on 11/03/2019 at 4:09:50 PM.    Final    [place] EKG SITE RITE  Result Date: 11/04/2019 If Site Rite image not attached, placement could not be confirmed due to current cardiac rhythm.    Phillips Climes M.D on 11/12/2019 at 12:38 PM  To page go to [email or web address] - password [facility]

## 2019-11-12 NOTE — Progress Notes (Signed)
ANTICOAGULATION CONSULT NOTE - Follow Up Consult  Pharmacy Consult for Lovenox Indication: atrial fibrillation  No Known Allergies  Patient Measurements: Height: 5\' 1"  (154.9 cm) Weight: 124 kg (273 lb 5.9 oz) IBW/kg (Calculated) : 47.8 Lovenox Dosing Weight: 124 kg  Vital Signs: Temp: 98.6 F (37 C) (05/11 0744) Temp Source: Oral (05/11 0744) BP: 102/65 (05/11 0744) Pulse Rate: 64 (05/11 0744)  Labs: Recent Labs    11/10/19 1508 11/10/19 1508 11/11/19 0328 11/12/19 0254  HGB 11.3*   < > 10.7* 10.7*  HCT 36.4  --  34.5* 33.7*  PLT 250  --  230 208  CREATININE 1.22*  --  1.24* 1.03*   < > = values in this interval not displayed.    Estimated Creatinine Clearance: 61 mL/min (A) (by C-G formula based on SCr of 1.03 mg/dL (H)).  Assessment: 73 YOF tansitioned from IV Heparin to Eliquis on 4/29. D-dimer continued to climb while on Eliquis. MD consulted Pharmacy to change to Full Dose Lovenox therapy on 5/4.  On 5/7, patient developed epistaxis thought to be caused by prolonged high flow nasal canula use. Epistaxis required a Rhino Rocket insertion, since epistaxis has improved. Lovenox has been held for a total of 4 doses since 5/7. Epistaxis has significantly improved and will resume full dose anticoagulation with Lovenox on 5/9.  Attempted a LMWH level today but this has been delayed and not yet drawn. Given a LMWH level drawn earlier this admission and comparison to the patient's current dosing - will reduce the dose slightly to 100 mg SQ bid for now given the recent nosebleeding. Noted plans to transition back to Apixaban soon and if continues on lovenox - will obtain a new level at steady state on this new dose.   Goal of Therapy:  Anti-Xa level 0.6-1 units/ml 4hrs after LMWH dose given Monitor platelets by anticoagulation protocol: Yes   Plan:  - Reduce Lovenox to 100 mg SQ bid - Will follow-up on plans to transition back to Apixaban - If continues on lovenox - will  check levels at steady state on this new dosage.  - Will monitor for nosebleeds, CBC trends, renal function  Thank you for allowing pharmacy to be a part of this patient's care.  Alycia Rossetti, PharmD, BCPS Clinical Pharmacist Clinical phone for 11/12/2019: [phone] 11/12/2019 2:30 PM   **Pharmacist phone directory can now be found on [email or web address] ([email or web address]).  Listed under [place].

## 2019-11-13 LAB — BASIC METABOLIC PANEL
Anion gap: 7 (ref 5–15)
BUN: 12 mg/dL (ref 8–23)
CO2: 27 mmol/L (ref 22–32)
Calcium: 8.2 mg/dL — ABNORMAL LOW (ref 8.9–10.3)
Chloride: 108 mmol/L (ref 98–111)
Creatinine, Ser: 1.04 mg/dL — ABNORMAL HIGH (ref 0.44–1.00)
GFR calc Af Amer: >60 mL/min (ref >60)
GFR calc non Af Amer: 54 mL/min — ABNORMAL LOW (ref >60)
Glucose, Bld: 107 mg/dL — ABNORMAL HIGH (ref 70–99)
Potassium: 3.6 mmol/L (ref 3.5–5.1)
Sodium: 142 mmol/L (ref 135–145)

## 2019-11-13 LAB — CBC
HCT: 33.2 % — ABNORMAL LOW (ref 36.0–46.0)
Hemoglobin: 10.4 g/dL — ABNORMAL LOW (ref 12.0–15.0)
MCH: 30.6 pg (ref 26.0–34.0)
MCHC: 31.3 g/dL (ref 30.0–36.0)
MCV: 97.6 fL (ref 80.0–100.0)
Platelets: 186 10*3/uL (ref 150–400)
RBC: 3.4 MIL/uL — ABNORMAL LOW (ref 3.87–5.11)
RDW: 14.4 % (ref 11.5–15.5)
WBC: 4.9 10*3/uL (ref 4.0–10.5)
nRBC: 0.4 % — ABNORMAL HIGH (ref 0.0–0.2)

## 2019-11-13 LAB — GLUCOSE, CAPILLARY
Glucose-Capillary: 85 mg/dL (ref 70–99)
Glucose-Capillary: 83 mg/dL (ref 70–99)
Glucose-Capillary: 108 mg/dL — ABNORMAL HIGH (ref 70–99)
Glucose-Capillary: 123 mg/dL — ABNORMAL HIGH (ref 70–99)

## 2019-11-13 MED ORDER — APIXABAN 5 MG PO TABS
5.0000 mg | ORAL_TABLET | Freq: Two times a day (BID) | ORAL | Status: DC
  Administered 2019-11-13 – 2019-11-14 (×2): 5 mg via ORAL
  Filled 2019-11-13 (×3): qty 1

## 2019-11-13 MED ORDER — AMIODARONE HCL 200 MG PO TABS
200.0000 mg | ORAL_TABLET | Freq: Every day | ORAL | Status: DC
  Administered 2019-11-13 – 2019-11-14 (×2): 200 mg via ORAL
  Filled 2019-11-13 (×2): qty 1

## 2019-11-13 MED ORDER — QUETIAPINE FUMARATE 25 MG PO TABS: 25.0000 mg | ORAL_TABLET | Freq: Every evening | ORAL | Status: DC | PRN

## 2019-11-13 NOTE — Progress Notes (Signed)
PROGRESS NOTE        PATIENT DETAILS Name: Julia Mcfarland Age: 74 y.o. Sex: female Date of Birth: [birth year] Admit Date: 10/28/2019 Admitting Physician No admitting provider for patient encounter. [id number], Coralie Keens, NP  Brief Narrative: Patient is a 74 y.o. female chronic venous insufficiency, morbid obesity who presented with shortness of breath-found to have A. fib with RVR and COVID-19 pneumonia requiring transfer to the ICU for heated high flow.  Hospital course complicated by pneumomediastinum, epistaxis.  See below for further details.  Significant events: 4/26>> admit by cardiology for A. fib with RVR 4/27>> Hospital medicine consult for COVID-19 pneumonia 4/29>> transfer to ICU for worsening hypoxemia-heated high flow. 5/8>> worsening epistaxis requiring Rhino Rocket placement  Antimicrobial therapy: Doxycycline: 5/8>> 5/11 Remdesivir: 4/27>> 5/1 Steroids: 4/27>> 5/1 Actemra: 4/28 x 1  Microbiology data: 4/30: Blood cultures negative so far  Significant studies: 4/27: Echo>> normal LV function-EF 55-60% 4/30: CXR>> pneumomediastinum 5/2: Lower extremity Doppler>> negative DVT  Procedures : None  Consults: Cardiology, PCCM  DVT Prophylaxis : Full dose Lovenox>> Eliquis  Subjective: Feels better-titrated to room air this morning.  No further epistaxis.  Assessment/Plan: Acute hypoxic respiratory failure secondary to COVID-19 pneumonia: Markedly better had-severe disease on initial presentation.  Titrated to room air today.  Pneumomediastinum: Secondary to COVID-19 pneumonia-stable-respiratory status continues to improve  PAF with RVR: Back in sinus rhythm-as recommended by cardiology changed amiodarone to 200 mg p.o. daily, will switch from Lantus to Eliquis.  Due to hypotension-no longer on Cardizem.  Epistaxis: Resolved-Rhino Rocket discontinued on 5/11.  Was on empiric doxycycline with Rhino Rocket in place-discontinued on  5/12.  Decompensated diastolic heart failure: Volume status stable-monitor closely.  Acute metabolic encephalopathy: Secondary to hypoxemia-resolved-completely awake and alert  DM-2 (A1c 6.3 on 4/26): CBG stable-in fact on the lower side this morning-stop Levemir-monitor on SSI.  Elevated D-dimer: Secondary to COVID-19 related inflammation-on Eliquis already for A. fib with RVR.  Morbid Obesity: Estimated body mass index is 51.9 kg/m as calculated from the following:   Height as of this encounter: 5\' 1"  (1.549 m).   Weight as of this encounter: 124.6 kg.    Diet: Diet Order            DIET SOFT Room service appropriate? Yes; Fluid consistency: Thin  Diet effective now               Code Status: Full code  Family Communication: Left a voicemail for spouse on 5/12  Disposition Plan: Status is: Inpatient  Remains inpatient appropriate because:Inpatient level of care appropriate due to severity of illness   Dispo: The patient is from: Home              Anticipated d/c is to: Home              Anticipated d/c date is: 1 day              Patient currently is not medically stable to d/c.  Barriers to Discharge: Transitioned to room air-stop IV doxy-switch from therapeutic Lovenox to Eliquis-monitor overnight for recurrence of epistaxis-if stable-discharge on 5/13  Antimicrobial agents: Anti-infectives (From admission, onward)   Start     Dose/Rate Route Frequency Ordered Stop   11/09/19 1630  doxycycline (VIBRA-TABS) tablet 100 mg  Status:  Discontinued     100 mg Oral Every 12  hours 11/09/19 1615 11/13/19 0836   10/30/19 1000  remdesivir 100 mg in sodium chloride 0.9 % 100 mL IVPB     100 mg 200 mL/hr over 30 Minutes Intravenous Daily 10/29/19 0914 11/02/19 0956   10/29/19 1000  remdesivir 200 mg in sodium chloride 0.9% 250 mL IVPB     200 mg 580 mL/hr over 30 Minutes Intravenous Once 10/29/19 0914 10/29/19 1236       Time spent: 25 minutes-Greater than 50% of  this time was spent in counseling, explanation of diagnosis, planning of further management, and coordination of care.  MEDICATIONS: Scheduled Meds: . amiodarone  200 mg Oral Daily  . apixaban  5 mg Oral BID  . Chlorhexidine Gluconate Cloth  6 each Topical Daily  . insulin aspart  0-20 Units Subcutaneous TID WC  . neomycin-bacitracin-polymyxin   Topical Daily  . pantoprazole  40 mg Oral Daily  . QUEtiapine  25 mg Oral QHS  . senna-docusate  2 tablet Oral BID   Continuous Infusions: PRN Meds:.acetaminophen, albuterol, metoprolol tartrate, ondansetron (ZOFRAN) IV, sodium chloride, sodium chloride flush   PHYSICAL EXAM: Vital signs: Vitals:   11/13/19 0058 11/13/19 0400 11/13/19 0447 11/13/19 0800  BP:  (!) 101/55  111/79  Pulse: 71 69  71  Resp: 16 14  20   Temp: 98.3 F (36.8 C) 99 F (37.2 C)  98.5 F (36.9 C)  TempSrc: Oral Axillary  Oral  SpO2: 98% 98%  95%  Weight:   124.6 kg   Height:       Filed Weights   11/11/19 0436 11/12/19 0400 11/13/19 0447  Weight: 124 kg 124 kg 124.6 kg   Body mass index is 51.9 kg/m.   Gen Exam:Alert awake-not in any distress HEENT:atraumatic, normocephalic Chest: B/L clear to auscultation anteriorly CVS:S1S2 regular Abdomen:soft non tender, non distended Extremities:no edema Neurology: Non focal Skin: no rash  I have personally reviewed following labs and imaging studies  LABORATORY DATA: CBC: Recent Labs  Lab 11/09/19 0340 11/10/19 1508 11/11/19 0328 11/12/19 0254 11/13/19 0458  WBC 7.9 6.2 6.3 5.9 4.9  HGB 11.6* 11.3* 10.7* 10.7* 10.4*  HCT 37.3 36.4 34.5* 33.7* 33.2*  MCV 96.4 97.3 98.0 98.3 97.6  PLT 279 250 230 208 [id number]    Basic Metabolic Panel: Recent Labs  Lab 11/07/19 0258 11/08/19 0204 11/09/19 0340 11/10/19 1508 11/11/19 0328 11/12/19 0254 11/13/19 0458  NA 138   < > 141 141 142 144 142  K 3.4*   < > 3.6 3.8 3.5 3.9 3.6  CL 98   < > 100 101 103 108 108  CO2 31   < > 32 30 31 29 27   GLUCOSE 95   <  > 89 137* 108* 91 107*  BUN 18   < > 14 10 14 11 12   CREATININE 1.24*   < > 1.14* 1.22* 1.24* 1.03* 1.04*  CALCIUM 8.4*   < > 8.6* 8.7* 8.6* 8.6* 8.2*  MG 2.0  --   --   --   --   --   --    < > = values in this interval not displayed.    GFR: Estimated Creatinine Clearance: 60.6 mL/min (A) (by C-G formula based on SCr of 1.04 mg/dL (H)).  Liver Function Tests: Recent Labs  Lab 11/07/19 0258  AST 31  ALT 28  ALKPHOS 53  BILITOT 1.1  PROT 5.2*  ALBUMIN 2.5*   No results for input(s): LIPASE, AMYLASE in the last 168 hours.  No results for input(s): AMMONIA in the last 168 hours.  Coagulation Profile: No results for input(s): INR, PROTIME in the last 168 hours.  Cardiac Enzymes: No results for input(s): CKTOTAL, CKMB, CKMBINDEX, TROPONINI in the last 168 hours.  BNP (last 3 results) No results for input(s): PROBNP in the last 8760 hours.  Lipid Profile: No results for input(s): CHOL, HDL, LDLCALC, TRIG, CHOLHDL, LDLDIRECT in the last 72 hours.  Thyroid Function Tests: No results for input(s): TSH, T4TOTAL, FREET4, T3FREE, THYROIDAB in the last 72 hours.  Anemia Panel: No results for input(s): VITAMINB12, FOLATE, FERRITIN, TIBC, IRON, RETICCTPCT in the last 72 hours.  Urine analysis:    Component Value Date/Time   COLORURINE YELLOW 02/28/2017 1154   APPEARANCEUR CLEAR 02/28/2017 1154   LABSPEC 1.010 02/28/2017 1154   PHURINE 6.0 02/28/2017 1154   GLUCOSEU NEGATIVE 02/28/2017 1154   HGBUR NEGATIVE 02/28/2017 1154   BILIRUBINUR neg 07/27/2018 1426   KETONESUR NEGATIVE 02/28/2017 1154   PROTEINUR Negative 07/27/2018 1426   PROTEINUR NEGATIVE 02/28/2017 1154   UROBILINOGEN 0.2 07/27/2018 1426   UROBILINOGEN 1.0 08/21/2008 1407   NITRITE neg 07/27/2018 1426   NITRITE NEGATIVE 02/28/2017 1154   LEUKOCYTESUR Negative 07/27/2018 1426    Sepsis Labs: Lactic Acid, Venous No results found for: LATICACIDVEN  MICROBIOLOGY: No results found for this or any previous  visit (from the past 240 hour(s)).  RADIOLOGY STUDIES/RESULTS: No results found.   LOS: 16 days   Oren Binet, MD  [facility]    To contact the attending provider between 7A-7P or the covering provider during after hours 7P-7A, please log into the web site [email or web address] and access using universal [place] password for that web site. If you do not have the password, please call the hospital operator.  11/13/2019, 11:06 AM

## 2019-11-13 NOTE — Progress Notes (Signed)
ANTICOAGULATION CONSULT NOTE - Initial Consult  Pharmacy Consult for Apixaban Indication: atrial fibrillation  No Known Allergies  Patient Measurements: Height: 5\' 1"  (154.9 cm) Weight: 124.6 kg (274 lb 11.1 oz) IBW/kg (Calculated) : 47.8  Vital Signs: Temp: 98.5 F (36.9 C) (05/12 0800) Temp Source: Oral (05/12 0800) BP: 101/55 (05/12 0400) Pulse Rate: 69 (05/12 0400)  Labs: Recent Labs    11/11/19 0328 11/11/19 0328 11/12/19 0254 11/13/19 0458  HGB 10.7*   < > 10.7* 10.4*  HCT 34.5*  --  33.7* 33.2*  PLT 230  --  208 186  CREATININE 1.24*  --  1.03* 1.04*   < > = values in this interval not displayed.    Estimated Creatinine Clearance: 60.6 mL/min (A) (by C-G formula based on SCr of 1.04 mg/dL (H)).   Medical History: Past Medical History:  Diagnosis Date  . Allergy   . Arthritis   . Chicken pox   . Chronic venous insufficiency    a. Uses furosemide prn.  . Degenerative joint disease   . Morbid obesity ([place])   . Osteoarthritis   . PONV (postoperative nausea and vomiting)     Medications:  Medications Prior to Admission  Medication Sig Dispense Refill Last Dose  . albuterol (VENTOLIN HFA) 108 (90 Base) MCG/ACT inhaler Inhale 2 puffs into the lungs every 6 (six) hours as needed for wheezing or shortness of breath. 8 g 1 Past Month at Unknown time  . Cholecalciferol (VITAMIN D3) 5000 UNITS CAPS Take 5,000 Units by mouth daily.    10/27/2019 at Unknown time  . fexofenadine (ALLEGRA ALLERGY) 180 MG tablet Take 1 tablet (180 mg total) by mouth daily. 90 tablet 2 Past Week at Unknown time  . furosemide (LASIX) 20 MG tablet Take 1 tablet (20 mg total) by mouth daily. 90 tablet 2 10/27/2019 at Unknown time  . hydroxypropyl methylcellulose / hypromellose (ISOPTO TEARS / GONIOVISC) 2.5 % ophthalmic solution Place 1 drop into both eyes daily as needed for dry eyes.   10/27/2019 at Unknown time  . vitamin B-12 (CYANOCOBALAMIN) 500 MCG tablet Take 500 mcg by mouth daily.    10/26/2019    Assessment: 74 YOF admitted on 4/26 and found to be in atrial fibrillation with RVR with heart rates into the 170s.  She was transitioned from IV Heparin to Eliquis on 4/29. D-dimer continuedto climb while on Eliquis. MD consulted Pharmacy to change to Full Dose Lovenox therapy on 5/4.  On 5/7, patient developed epistaxis thought to be caused by prolonged high flow nasal canula use. Epistaxis required a Rhino Rocket insertion, since epistaxis has improved (enoxaparin held 5/7-5/8 then restarted on 5/9) and Rhino Rocket has been discontinued with no recurrence of epistaxis.   Today, Pharmacy has been consulted to initiate apixaban.  Patient is less than 74 years old, weight is greater than 60 kg, and creatinine is less than 1.5.  No bleeding noted.  Last dose of enoxaparin was 11/13/19 at 0508.  Goal of Therapy:  Prevent stroke and systemic embolism Monitor platelets by anticoagulation protocol: Yes   Plan:  Discontinue enoxaparin Begin apixaban 5 mg po BID today at 1800 Monitor serum creatinine, CBC, s/s bleeding  Efraim Kaufmann, PharmD, BCPS 11/13/2019,8:55 AM

## 2019-11-14 ENCOUNTER — Telehealth: Payer: Self-pay

## 2019-11-14 LAB — GLUCOSE, CAPILLARY: Glucose-Capillary: 103 mg/dL — ABNORMAL HIGH (ref 70–99)

## 2019-11-14 MED ORDER — AMIODARONE HCL 200 MG PO TABS: 200.0000 mg | ORAL_TABLET | Freq: Every day | ORAL | 0 refills | Status: DC

## 2019-11-14 MED ORDER — FUROSEMIDE 20 MG PO TABS: 20.0000 mg | ORAL_TABLET | Freq: Every day | ORAL | 0 refills | Status: DC

## 2019-11-14 MED ORDER — APIXABAN 5 MG PO TABS: 5.0000 mg | ORAL_TABLET | Freq: Two times a day (BID) | ORAL | 0 refills | Status: DC

## 2019-11-14 NOTE — Telephone Encounter (Signed)
Transition Care Management Follow-up Telephone Call  Date of discharge and from where: [facility]14/[facility]  How have you been since you were released from the hospital? Patient states that she is feeling much better.  Any questions or concerns? No   Items Reviewed:  Did the pt receive and understand the discharge instructions provided? Yes   Medications obtained and verified? Yes   Any new allergies since your discharge? No   Dietary orders reviewed? Yes  Do you have support at home? Yes   Functional Questionnaire: (I = Independent and D = Dependent) ADLs: I  Bathing/Dressing- I  Meal Prep- I  Eating- I  Maintaining continence- I  Transferring/Ambulation- I  Managing Meds- I  Follow up appointments reviewed:   PCP Hospital f/u appt confirmed? Yes  Scheduled to see Webb Silversmith, NP on 11/25/2019 @ 9:30 am.  [facility] f/u appt confirmed? Yes  Scheduled to see cardiology   Are transportation arrangements needed? No   If their condition worsens, is the pt aware to call PCP or go to the Emergency Dept.? Yes  Was the patient provided with contact information for the PCP's office or ED? Yes  Was to pt encouraged to call back with questions or concerns? Yes

## 2019-11-14 NOTE — TOC Transition Note (Addendum)
Transition of Care [facility]) - CM/SW Discharge Note   Patient Details  Name: Julia Mcfarland MRN: [id number] Date of Birth: 05-May-1946  [facility] CM/SW Contact:  Maryclare Labrador, RN Phone Number: 11/14/2019, 8:55 AM   Clinical Narrative:  Pt is now recommended for home discharge instead of SNF.  Pt is alert and oriented - pt agrees with home discharge along with [facility].  Pt confirms she has a PCP and denied barriers with paying for discharge medications.   Pt informed CM that she already has RW and 3:1 as recommended by therapy.  CM provided medicare.gov HH list - pt would like to use [facility].  Alvis Lemmings accepts referral   CM informed pt of ongoing copay for Eliquis  Discharge order signed - CM signing off    Final next level of care: [place] Barriers to Discharge: Barriers Resolved   Patient Goals and CMS Choice Patient states their goals for this hospitalization and ongoing recovery are:: Pt informed CM that her huband is also hospitalized, pt states she is hoping her husband recovers soon so they can both be home CMS Medicare.gov Compare Post Acute Care list provided to:: Patient Choice offered to / list presented to : Patient  Discharge Placement                       Discharge Plan and Services In-house Referral: Clinical Social Work                        HH Arranged: PT, OT HH Agency: [place] Date [facility] Agency Contacted: 11/14/19 Time [place]: [phone] Representative spoke with at [place]: [place] ([place]) Interventions     Readmission Risk Interventions Readmission Risk Prevention Plan 11/08/2019  Transportation Screening Complete  PCP or Specialist Appt within 5-7 Days Complete  Home Care Screening Complete  Medication Review (RN CM) Complete  Some recent data might be hidden

## 2019-11-14 NOTE — Discharge Instructions (Signed)
Person Under Monitoring Name: Julia Mcfarland  Location: [address] [place] [address]   Infection Prevention Recommendations for Individuals Confirmed to have, or Being Evaluated for, 2019 Novel Coronavirus (COVID-19) Infection Who Receive Care at Home  Individuals who are confirmed to have, or are being evaluated for, COVID-19 should follow the prevention steps below until a healthcare provider or local or state health department says they can return to normal activities.  Stay home except to get medical care You should restrict activities outside your home, except for getting medical care. Do not go to work, school, or public areas, and do not use public transportation or taxis.  Call ahead before visiting your doctor Before your medical appointment, call the healthcare provider and tell them that you have, or are being evaluated for, COVID-19 infection. This will help the healthcare provider's office take steps to keep other people from getting infected. Ask your healthcare provider to call the local or state health department.  Monitor your symptoms Seek prompt medical attention if your illness is worsening (e.g., difficulty breathing). Before going to your medical appointment, call the healthcare provider and tell them that you have, or are being evaluated for, COVID-19 infection. Ask your healthcare provider to call the local or state health department.  Wear a facemask You should wear a facemask that covers your nose and mouth when you are in the same room with other people and when you visit a healthcare provider. People who live with or visit you should also wear a facemask while they are in the same room with you.  Separate yourself from other people in your home As much as possible, you should stay in a different room from other people in your home. Also, you should use a separate bathroom, if available.  Avoid sharing household  items You should not share dishes, drinking glasses, cups, eating utensils, towels, bedding, or other items with other people in your home. After using these items, you should wash them thoroughly with soap and water.  Cover your coughs and sneezes Cover your mouth and nose with a tissue when you cough or sneeze, or you can cough or sneeze into your sleeve. Throw used tissues in a lined trash can, and immediately wash your hands with soap and water for at least 20 seconds or use an alcohol-based hand rub.  Wash your [facility] your hands often and thoroughly with soap and water for at least 20 seconds. You can use an alcohol-based hand sanitizer if soap and water are not available and if your hands are not visibly dirty. Avoid touching your eyes, nose, and mouth with unwashed hands.   Prevention Steps for Caregivers and Household Members of Individuals Confirmed to have, or Being Evaluated for, COVID-19 Infection Being Cared for in the Home  If you live with, or provide care at home for, a person confirmed to have, or being evaluated for, COVID-19 infection please follow these guidelines to prevent infection:  Follow healthcare provider's instructions Make sure that you understand and can help the patient follow any healthcare provider instructions for all care.  Provide for the patient's basic needs You should help the patient with basic needs in the home and provide support for getting groceries, prescriptions, and other personal needs.  Monitor the patient's symptoms If they are getting sicker, call his or her medical provider and tell them that the patient has, or is being evaluated for, COVID-19 infection.  This will help the healthcare provider's office take steps to keep other people from getting infected. Ask the healthcare provider to call the local or state health department.  Limit the number of people who have contact with the patient  If possible, have only one  caregiver for the patient.  Other household members should stay in another home or place of residence. If this is not possible, they should stay  in another room, or be separated from the patient as much as possible. Use a separate bathroom, if available.  Restrict visitors who do not have an essential need to be in the home.  Keep older adults, very young children, and other sick people away from the patient Keep older adults, very young children, and those who have compromised immune systems or chronic health conditions away from the patient. This includes people with chronic heart, lung, or kidney conditions, diabetes, and cancer.  Ensure good ventilation Make sure that shared spaces in the home have good air flow, such as from an air conditioner or an opened window, weather permitting.  Wash your hands often  Wash your hands often and thoroughly with soap and water for at least 20 seconds. You can use an alcohol based hand sanitizer if soap and water are not available and if your hands are not visibly dirty.  Avoid touching your eyes, nose, and mouth with unwashed hands.  Use disposable paper towels to dry your hands. If not available, use dedicated cloth towels and replace them when they become wet.  Wear a facemask and gloves  Wear a disposable facemask at all times in the room and gloves when you touch or have contact with the patient's blood, body fluids, and/or secretions or excretions, such as sweat, saliva, sputum, nasal mucus, vomit, urine, or feces.  Ensure the mask fits over your nose and mouth tightly, and do not touch it during use.  Throw out disposable facemasks and gloves after using them. Do not reuse.  Wash your hands immediately after removing your facemask and gloves.  If your personal clothing becomes contaminated, carefully remove clothing and launder. Wash your hands after handling contaminated clothing.  Place all used disposable facemasks, gloves, and  other waste in a lined container before disposing them with other household waste.  Remove gloves and wash your hands immediately after handling these items.  Do not share dishes, glasses, or other household items with the patient  Avoid sharing household items. You should not share dishes, drinking glasses, cups, eating utensils, towels, bedding, or other items with a patient who is confirmed to have, or being evaluated for, COVID-19 infection.  After the person uses these items, you should wash them thoroughly with soap and water.  Wash laundry thoroughly  Immediately remove and wash clothes or bedding that have blood, body fluids, and/or secretions or excretions, such as sweat, saliva, sputum, nasal mucus, vomit, urine, or feces, on them.  Wear gloves when handling laundry from the patient.  Read and follow directions on labels of laundry or clothing items and detergent. In general, wash and dry with the warmest temperatures recommended on the label.  Clean all areas the individual has used often  Clean all touchable surfaces, such as counters, tabletops, doorknobs, bathroom fixtures, toilets, phones, keyboards, tablets, and bedside tables, every day. Also, clean any surfaces that may have blood, body fluids, and/or secretions or excretions on them.  Wear gloves when cleaning surfaces the patient has come in contact with.  Use a diluted bleach  solution (e.g., dilute bleach with 1 part bleach and 10 parts water) or a household disinfectant with a label that says EPA-registered for coronaviruses. To make a bleach solution at home, add 1 tablespoon of bleach to 1 quart (4 cups) of water. For a larger supply, add  cup of bleach to 1 gallon (16 cups) of water.  Read labels of cleaning products and follow recommendations provided on product labels. Labels contain instructions for safe and effective use of the cleaning product including precautions you should take when applying the product,  such as wearing gloves or eye protection and making sure you have good ventilation during use of the product.  Remove gloves and wash hands immediately after cleaning.  Monitor yourself for signs and symptoms of illness Caregivers and household members are considered close contacts, should monitor their health, and will be asked to limit movement outside of the home to the extent possible. Follow the monitoring steps for close contacts listed on the symptom monitoring form.   ? If you have additional questions, contact your local health department or call the epidemiologist on call at [phone] (available 24/7). ? This guidance is subject to change. For the most up-to-date guidance from [facility], please refer to their website: [email or web address]-mayo.[email or web address] on my medicine - ELIQUIS (apixaban)  This medication education was reviewed with me or my healthcare representative as part of my discharge preparation.   Why was Eliquis prescribed for you? Eliquis was prescribed for you to reduce the risk of a blood clot forming that can cause a stroke if you have a medical condition called atrial fibrillation (a type of irregular heartbeat).  What do You need to know about Eliquis ? Take your Eliquis TWICE DAILY - one tablet in the morning and one tablet in the evening with or without food. If you have difficulty swallowing the tablet whole please discuss with your pharmacist how to take the medication safely.  Take Eliquis exactly as prescribed by your doctor and DO NOT stop taking Eliquis without talking to the doctor who prescribed the medication.  Stopping may increase your risk of developing a stroke.  Refill your prescription before you run out.  After discharge, you should have regular check-up appointments with your healthcare provider that is prescribing your Eliquis.  In the future your dose may need to be changed if your kidney  function or weight changes by a significant amount or as you get older.  What do you do if you miss a dose? If you miss a dose, take it as soon as you remember on the same day and resume taking twice daily.  Do not take more than one dose of ELIQUIS at the same time to make up a missed dose.  Important Safety Information A possible side effect of Eliquis is bleeding. You should call your healthcare provider right away if you experience any of the following: ? Bleeding from an injury or your nose that does not stop. ? Unusual colored urine (red or dark brown) or unusual colored stools (red or black). ? Unusual bruising for unknown reasons. ? A serious fall or if you hit your head (even if there is no bleeding).  Some medicines may interact with Eliquis and might increase your risk of bleeding or clotting while on Eliquis. To help avoid this, consult your healthcare provider or pharmacist prior to using any new prescription or non-prescription medications, including herbals, vitamins, non-steroidal anti-inflammatory drugs (NSAIDs) and supplements.  This website has more information on Eliquis (apixaban):  http://[email or web address].[email or web address]/home

## 2019-11-14 NOTE — Discharge Summary (Signed)
PATIENT DETAILS Name: Julia Mcfarland Age: 74 y.o. Sex: female Date of Birth: September 08, 1945 MRN: [id number]. Admitting Physician: No admitting provider for patient encounter. [id number], Coralie Keens, NP  Admit Date: 10/28/2019 Discharge date: 11/14/2019  Recommendations for Outpatient Follow-up:  1. Follow up with PCP in 1-2 weeks 2. Please obtain CMP/CBC in one week 3. Repeat Chest Xray in 4-6 week 4. Please ensure follow-up with cardiology  Admitted From:  Home   Disposition: Home with home health services   Home Health: Yes  Equipment/Devices: None  Discharge Condition: Stable  CODE STATUS: FULL CODE  Diet recommendation:  Diet Order            Diet - low sodium heart healthy        DIET SOFT Room service appropriate? Yes; Fluid consistency: Thin  Diet effective now               Brief Narrative: Patient is a 74 y.o. female chronic venous insufficiency, morbid obesity who presented with shortness of breath-found to have A. fib with RVR and COVID-19 pneumonia requiring transfer to the ICU for heated high flow.  Hospital course complicated by pneumomediastinum, epistaxis.  See below for further details.  Significant events: 4/26>> admit by cardiology for A. fib with RVR 4/27>> Hospital medicine consult for COVID-19 pneumonia 4/29>> transfer to ICU for worsening hypoxemia-heated high flow. 5/8>> worsening epistaxis requiring Rhino Rocket placement  Antimicrobial therapy: Doxycycline: 5/8>> 5/11 Remdesivir: 4/27>> 5/1 Steroids: 4/27>> 5/1 Actemra: 4/28 x 1  Microbiology data: 4/30: Blood cultures negative so far  Significant studies: 4/27: Echo>> normal LV function-EF 55-60% 4/30: CXR>> pneumomediastinum 5/2: Lower extremity Doppler>> negative DVT  Procedures : None  Consults: Cardiology, [facility]  Brief Hospital Course: Acute hypoxic respiratory failure secondary to COVID-19 pneumonia: Markedly better had-severe disease on initial presentation.    Has been on room air for the past several days.  Pneumomediastinum: Secondary to COVID-19 pneumonia-stable-respiratory status continues to improve. Remains on room air-consider repeating chest x-ray in a few weeks.  PAF with RVR: Back in sinus rhythm-as recommended by cardiology changed amiodarone to 200 mg p.o. daily, has been switched from Lantus to Eliquis.  Due to hypotension-no longer on Cardizem.  Epistaxis: Resolved-Rhino Rocket discontinued on 5/11.  Was on empiric doxycycline with Rhino Rocket in place-discontinued doxy on 5/12.  Decompensated diastolic heart failure: Volume status stable-monitor closely.  Acute metabolic encephalopathy: Secondary to hypoxemia-resolved-completely awake and alert  DM-2 (A1c 6.3 on 4/26):  CBGs on the higher side due to steroids-managed with insulin-CBGs have been stable over the past few days-she will continue to follow-up with her primary care doctor for further needed treatment.  Elevated D-dimer: Secondary to COVID-19 related inflammation-on Eliquis already for A. fib with RVR.  Morbid Obesity: Estimated body mass index is 51.9 kg/m as calculated from the following:   Height as of this encounter: 5\' 1"  (1.549 m).   Weight as of this encounter: 124.6 kg.  COVID-19 Labs:  No results for input(s): DDIMER, FERRITIN, LDH, CRP in the last 72 hours.  Lab Results  Component Value Date   SARSCOV2NAA POSITIVE (A) 10/28/2019     Obesity: Estimated body mass index is 51.99 kg/m as calculated from the following:   Height as of this encounter: 5\' 1"  (1.549 m).   Weight as of this encounter: 124.8 kg.   Discharge Diagnoses:  Principal Problem:   Atrial fibrillation with rapid ventricular response (HCC) Active Problems:   Morbid obesity with BMI of 50.0-59.9, adult (  [place])   Prediabetes   Pneumonia due to COVID-19 virus   Acute respiratory failure with hypoxia (HCC)   AKI (acute kidney injury) [facility])   Discharge  Instructions:    Person Under Monitoring Name: Julia Mcfarland  Location: [place] [address]   Infection Prevention Recommendations for Individuals Confirmed to have, or Being Evaluated for, 2019 Novel Coronavirus (COVID-19) Infection Who Receive Care at Home  Individuals who are confirmed to have, or are being evaluated for, COVID-19 should follow the prevention steps below until a healthcare provider or local or state health department says they can return to normal activities.  Stay home except to get medical care You should restrict activities outside your home, except for getting medical care. Do not go to work, school, or public areas, and do not use public transportation or taxis.  Call ahead before visiting your doctor Before your medical appointment, call the healthcare provider and tell them that you have, or are being evaluated for, COVID-19 infection. This will help the healthcare provider's office take steps to keep other people from getting infected. Ask your healthcare provider to call the local or state health department.  Monitor your symptoms Seek prompt medical attention if your illness is worsening (e.g., difficulty breathing). Before going to your medical appointment, call the healthcare provider and tell them that you have, or are being evaluated for, COVID-19 infection. Ask your healthcare provider to call the local or state health department.  Wear a facemask You should wear a facemask that covers your nose and mouth when you are in the same room with other people and when you visit a healthcare provider. People who live with or visit you should also wear a facemask while they are in the same room with you.  Separate yourself from other people in your home As much as possible, you should stay in a different room from other people in your home. Also, you should use a separate bathroom, if available.  Avoid sharing household  items You should not share dishes, drinking glasses, cups, eating utensils, towels, bedding, or other items with other people in your home. After using these items, you should wash them thoroughly with soap and water.  Cover your coughs and sneezes Cover your mouth and nose with a tissue when you cough or sneeze, or you can cough or sneeze into your sleeve. Throw used tissues in a lined trash can, and immediately wash your hands with soap and water for at least 20 seconds or use an alcohol-based hand rub.  Wash your [facility] your hands often and thoroughly with soap and water for at least 20 seconds. You can use an alcohol-based hand sanitizer if soap and water are not available and if your hands are not visibly dirty. Avoid touching your eyes, nose, and mouth with unwashed hands.   Prevention Steps for Caregivers and Household Members of Individuals Confirmed to have, or Being Evaluated for, COVID-19 Infection Being Cared for in the Home  If you live with, or provide care at home for, a person confirmed to have, or being evaluated for, COVID-19 infection please follow these guidelines to prevent infection:  Follow healthcare provider's instructions Make sure that you understand and can help the patient follow any healthcare provider instructions for all care.  Provide for the patient's basic needs You should help the patient with basic needs in the home and provide support for getting groceries, prescriptions, and other personal needs.  Monitor the  patient's symptoms If they are getting sicker, call his or her medical provider and tell them that the patient has, or is being evaluated for, COVID-19 infection. This will help the healthcare provider's office take steps to keep other people from getting infected. Ask the healthcare provider to call the local or state health department.  Limit the number of people who have contact with the patient  If possible, have only one  caregiver for the patient.  Other household members should stay in another home or place of residence. If this is not possible, they should stay  in another room, or be separated from the patient as much as possible. Use a separate bathroom, if available.  Restrict visitors who do not have an essential need to be in the home.  Keep older adults, very young children, and other sick people away from the patient Keep older adults, very young children, and those who have compromised immune systems or chronic health conditions away from the patient. This includes people with chronic heart, lung, or kidney conditions, diabetes, and cancer.  Ensure good ventilation Make sure that shared spaces in the home have good air flow, such as from an air conditioner or an opened window, weather permitting.  Wash your hands often  Wash your hands often and thoroughly with soap and water for at least 20 seconds. You can use an alcohol based hand sanitizer if soap and water are not available and if your hands are not visibly dirty.  Avoid touching your eyes, nose, and mouth with unwashed hands.  Use disposable paper towels to dry your hands. If not available, use dedicated cloth towels and replace them when they become wet.  Wear a facemask and gloves  Wear a disposable facemask at all times in the room and gloves when you touch or have contact with the patient's blood, body fluids, and/or secretions or excretions, such as sweat, saliva, sputum, nasal mucus, vomit, urine, or feces.  Ensure the mask fits over your nose and mouth tightly, and do not touch it during use.  Throw out disposable facemasks and gloves after using them. Do not reuse.  Wash your hands immediately after removing your facemask and gloves.  If your personal clothing becomes contaminated, carefully remove clothing and launder. Wash your hands after handling contaminated clothing.  Place all used disposable facemasks, gloves, and  other waste in a lined container before disposing them with other household waste.  Remove gloves and wash your hands immediately after handling these items.  Do not share dishes, glasses, or other household items with the patient  Avoid sharing household items. You should not share dishes, drinking glasses, cups, eating utensils, towels, bedding, or other items with a patient who is confirmed to have, or being evaluated for, COVID-19 infection.  After the person uses these items, you should wash them thoroughly with soap and water.  Wash laundry thoroughly  Immediately remove and wash clothes or bedding that have blood, body fluids, and/or secretions or excretions, such as sweat, saliva, sputum, nasal mucus, vomit, urine, or feces, on them.  Wear gloves when handling laundry from the patient.  Read and follow directions on labels of laundry or clothing items and detergent. In general, wash and dry with the warmest temperatures recommended on the label.  Clean all areas the individual has used often  Clean all touchable surfaces, such as counters, tabletops, doorknobs, bathroom fixtures, toilets, phones, keyboards, tablets, and bedside tables, every day. Also, clean any surfaces that may have  blood, body fluids, and/or secretions or excretions on them.  Wear gloves when cleaning surfaces the patient has come in contact with.  Use a diluted bleach solution (e.g., dilute bleach with 1 part bleach and 10 parts water) or a household disinfectant with a label that says EPA-registered for coronaviruses. To make a bleach solution at home, add 1 tablespoon of bleach to 1 quart (4 cups) of water. For a larger supply, add  cup of bleach to 1 gallon (16 cups) of water.  Read labels of cleaning products and follow recommendations provided on product labels. Labels contain instructions for safe and effective use of the cleaning product including precautions you should take when applying the product,  such as wearing gloves or eye protection and making sure you have good ventilation during use of the product.  Remove gloves and wash hands immediately after cleaning.  Monitor yourself for signs and symptoms of illness Caregivers and household members are considered close contacts, should monitor their health, and will be asked to limit movement outside of the home to the extent possible. Follow the monitoring steps for close contacts listed on the symptom monitoring form.   ? If you have additional questions, contact your local health department or call the epidemiologist on call at [phone] (available 24/7). ? This guidance is subject to change. For the most up-to-date guidance from CDC, please refer to their website: YouBlogs.pl    Activity:  As tolerated  Discharge Instructions    Call MD for:  difficulty breathing, headache or visual disturbances   Complete by: As directed    Diet - low sodium heart healthy   Complete by: As directed    Discharge instructions   Complete by: As directed    Follow with Primary MD  [name], NP in 1-2 weeks  Please get a complete blood count and chemistry panel checked by your Primary MD at your next visit, and again as instructed by your Primary MD.  Get Medicines reviewed and adjusted: Please take all your medications with you for your next visit with your Primary MD  Laboratory/radiological data: Please request your Primary MD to go over all hospital tests and procedure/radiological results at the follow up, please ask your Primary MD to get all Hospital records sent to his/her office.  In some cases, they will be blood work, cultures and biopsy results pending at the time of your discharge. Please request that your primary care M.D. follows up on these results.  Also Note the following: If you experience worsening of your admission symptoms, develop shortness of  breath, life threatening emergency, suicidal or homicidal thoughts you must seek medical attention immediately by calling 911 or calling your MD immediately  if symptoms less severe.  You must read complete instructions/literature along with all the possible adverse reactions/side effects for all the Medicines you take and that have been prescribed to you. Take any new Medicines after you have completely understood and accpet all the possible adverse reactions/side effects.   Do not drive when taking Pain medications or sleeping medications (Benzodaizepines)  Do not take more than prescribed Pain, Sleep and Anxiety Medications. It is not advisable to combine anxiety,sleep and pain medications without talking with your primary care practitioner  Special Instructions: If you have smoked or chewed Tobacco  in the last 2 yrs please stop smoking, stop any regular Alcohol  and or any Recreational drug use.  Wear Seat belts while driving.  Please note: You were cared for by  a hospitalist during your hospital stay. Once you are discharged, your primary care physician will handle any further medical issues. Please note that NO REFILLS for any discharge medications will be authorized once you are discharged, as it is imperative that you return to your primary care physician (or establish a relationship with a primary care physician if you do not have one) for your post hospital discharge needs so that they can reassess your need for medications and monitor your lab values.   1.) 3 weeks of isolation from 10/28/19   Increase activity slowly   Complete by: As directed      Allergies as of 11/14/2019   No Known Allergies     Medication List    TAKE these medications   albuterol 108 (90 Base) MCG/ACT inhaler Commonly known as: VENTOLIN HFA Inhale 2 puffs into the lungs every 6 (six) hours as needed for wheezing or shortness of breath.   amiodarone 200 MG tablet Commonly known as: PACERONE Take 1  tablet (200 mg total) by mouth daily. Start taking on: Nov 15, 2019   apixaban 5 MG Tabs tablet Commonly known as: ELIQUIS Take 1 tablet (5 mg total) by mouth 2 (two) times daily.   fexofenadine 180 MG tablet Commonly known as: Allegra Allergy Take 1 tablet (180 mg total) by mouth daily.   furosemide 20 MG tablet Commonly known as: LASIX Take 1 tablet (20 mg total) by mouth daily.   hydroxypropyl methylcellulose / hypromellose 2.5 % ophthalmic solution Commonly known as: ISOPTO TEARS / GONIOVISC Place 1 drop into both eyes daily as needed for dry eyes.   vitamin B-12 500 MCG tablet Commonly known as: CYANOCOBALAMIN Take 500 mcg by mouth daily.   Vitamin D3 125 MCG (5000 UT) Caps Take 5,000 Units by mouth daily.      Follow-up Information    [name], NP. Schedule an appointment as soon as possible for a visit in 1 week(s).   Specialties: Internal Medicine, Emergency Medicine Contact information: [place] [address] [phone]        Sanda Klein, MD Follow up.   Specialty: Cardiology Why: OFFICE WILL CALL YOU FOR A  FOLLOW UP APPOINTMENT Contact information: [address] [place] [address] [phone]          No Known Allergies   Other Procedures/Studies: DG Chest Port 1 View  Result Date: 11/05/2019 CLINICAL DATA:  Shortness of breath EXAM: PORTABLE CHEST 1 VIEW COMPARISON:  Nov 04, 2019 and Nov 02, 2019 FINDINGS: Central catheter tip is in the superior vena cava. No pneumothorax. Note that there is subcutaneous air in the supraclavicular regions, stable. There is also a degree of pneumomediastinum, less pronounced than on study from 3 days prior. There is airspace opacity bilaterally with consolidation in the right lower lobe. No new opacity evident. Heart size and pulmonary vascularity are normal. No adenopathy. There is degenerative change in thoracic spine. IMPRESSION: Multifocal airspace opacity with  consolidation right base, similar to recent studies. Stable cardiac silhouette. Central catheter tip in superior vena cava. No pneumothorax. However, there is pneumomediastinum as well as subcutaneous air in the supraclavicular regions, more on the right than the left. Electronically Signed   By: Lowella Grip III M.D.   On: 11/05/2019 09:11   DG Chest Port 1 View  Result Date: 11/04/2019 CLINICAL DATA:  PICC line placement EXAM: PORTABLE CHEST 1 VIEW COMPARISON:  11/04/2019 FINDINGS: Right PICC line is been placed.  The tip is at the cavoatrial junction. Stable diffuse bilateral airspace disease. No visible effusion or pneumothorax. Subcutaneous emphysema again noted in the right neck base, stable. No acute bony abnormality. IMPRESSION: Right PICC line tip at the cavoatrial junction. Stable bilateral airspace disease. Electronically Signed   By: Rolm Baptise M.D.   On: 11/04/2019 18:46   DG Chest Port 1 View  Result Date: 11/04/2019 CLINICAL DATA:  Short of breath. EXAM: PORTABLE CHEST 1 VIEW COMPARISON:  11/02/2019 FINDINGS: Normal heart size. Extensive, bilateral pulmonary opacities are identified which appear unchanged from previous exam. No pneumothorax. Subcutaneous emphysema is identified within the right neck. Etiology indeterminate. IMPRESSION: 1. No change in aeration a lungs compared with previous exam. 2. Right neck subcu emphysema noted. Electronically Signed   By: Kerby Moors M.D.   On: 11/04/2019 09:33   DG Chest Port 1 View  Result Date: 11/02/2019 CLINICAL DATA:  Shortness of breath, COVID positive EXAM: PORTABLE CHEST 1 VIEW COMPARISON:  11/01/2019 FINDINGS: Diffuse bilateral pulmonary opacities. Worsening left lung aeration. No pneumothorax. Pneumomediastinum is similar. Stable cardiomediastinal contours. No significant pleural effusion. IMPRESSION: Bilateral pneumonia with worsening lung aeration on the left. Similar pneumomediastinum. Electronically Signed   By: Macy Mis  M.D.   On: 11/02/2019 07:04   DG Chest Port 1 View  Result Date: 11/01/2019 CLINICAL DATA:  Acute respiratory failure EXAM: PORTABLE CHEST 1 VIEW COMPARISON:  Yesterday FINDINGS: Pneumomediastinum is newly seen along both mediastinal contours. No visible pneumothorax. Extensive bilateral pneumonia. Cardiomegaly and aortic tortuosity. These results will be called to the ordering clinician or representative by the Radiologist Assistant, and communication documented in the PACS or [employer]. IMPRESSION: 1. New pneumomediastinum. 2. Stable bilateral pneumonia.  No visible pneumothorax. Electronically Signed   By: Monte Fantasia M.D.   On: 11/01/2019 07:21   DG CHEST PORT 1 VIEW  Result Date: 10/31/2019 CLINICAL DATA:  Tachypnea EXAM: PORTABLE CHEST 1 VIEW COMPARISON:  10/31/2019, 10/30/2019, 10/28/2019 FINDINGS: Rotated patient. Fairly extensive bilateral ground-glass opacities and consolidations probably without significant change allowing for rotated patient. Enlarged cardiomediastinal silhouette also exaggerated by rotation. No pleural effusion or pneumothorax. IMPRESSION: Likely no significant interval change in fairly extensive bilateral airspace disease and consolidations allowing for patient rotation Electronically Signed   By: Donavan Foil M.D.   On: 10/31/2019 19:56   DG Chest Port 1 View  Result Date: 10/31/2019 CLINICAL DATA:  Shortness of breath. COVID-19 viral pneumonia. EXAM: PORTABLE CHEST 1 VIEW COMPARISON:  02/21/2020 FINDINGS: Heart size remains within normal limits. Bilateral diffuse heterogeneous airspace opacity shows no significant change. No evidence of pneumothorax or pleural effusion. IMPRESSION: No significant change in diffuse heterogeneous airspace disease. Electronically Signed   By: Marlaine Hind M.D.   On: 10/31/2019 08:16   DG Chest Port 1 View  Result Date: 10/30/2019 CLINICAL DATA:  COVID positive. EXAM: PORTABLE CHEST 1 VIEW COMPARISON:  Chest x-ray dated October 28, 2019. FINDINGS: Stable cardiomediastinal silhouette. Normal pulmonary vascularity. Low lung volumes. Patchy interstitial and airspace opacities throughout both lungs, mildly worsened at the left lung base. No pleural effusion or pneumothorax. No acute osseous abnormality. IMPRESSION: 1. Multifocal pneumonia, mildly worsened at the left lung base. Electronically Signed   By: Titus Dubin M.D.   On: 10/30/2019 08:26   DG Chest Portable 1 View  Result Date: 10/28/2019 CLINICAL DATA:  Dyspnea with exertion. EXAM: PORTABLE CHEST 1 VIEW COMPARISON:  February 28, 2017. FINDINGS: Stable cardiomediastinal silhouette. Central pulmonary vascular congestion is noted. Bilateral  lung opacities are noted which may represent edema or possibly multifocal pneumonia. Atherosclerosis of thoracic aorta is noted. No pneumothorax or pleural effusion is noted. Bony thorax is unremarkable. IMPRESSION: Aortic atherosclerosis. Central pulmonary vascular congestion is noted. Bilateral lung opacities are noted which may represent edema or possibly multifocal pneumonia. Aortic Atherosclerosis (ICD10-I70.0). Electronically Signed   By: Marijo Conception M.D.   On: 10/28/2019 15:42   ECHOCARDIOGRAM COMPLETE  Result Date: 10/29/2019    ECHOCARDIOGRAM REPORT   Patient Name:   OLUKEMI CLOTFELTER Date of Exam: 10/29/2019 Medical Rec #:  [id number]      Height:       61.0 in Accession #:    [id number]     Weight:       270.0 lb Date of Birth:  14-Apr-1946      BSA:          2.146 m Patient Age:    58 years       BP:           136/73 mmHg Patient Gender: F              HR:           135 bpm. Exam Location:  Inpatient Procedure: 2D Echo, Cardiac Doppler and Color Doppler Indications:    I48.0 Paroxysmal atrial fibrillation  History:        Patient has no prior history of Echocardiogram examinations.                 COVID-19 Positive.  Sonographer:    Jonelle Sidle Dance Referring Phys: [place]  1. Normal LV function; mild  LAE.  2. Left ventricular ejection fraction, by estimation, is 55 to 60%. The left ventricle has normal function. The left ventricle has no regional wall motion abnormalities. Left ventricular diastolic function could not be evaluated.  3. Right ventricular systolic function is normal. The right ventricular size is normal. Tricuspid regurgitation signal is inadequate for assessing PA pressure.  4. Left atrial size was mildly dilated.  5. The mitral valve is normal in structure. Trivial mitral valve regurgitation. No evidence of mitral stenosis.  6. The aortic valve is tricuspid. Aortic valve regurgitation is not visualized. No aortic stenosis is present.  7. The inferior vena cava is normal in size with greater than 50% respiratory variability, suggesting right atrial pressure of 3 mmHg. FINDINGS  Left Ventricle: Left ventricular ejection fraction, by estimation, is 55 to 60%. The left ventricle has normal function. The left ventricle has no regional wall motion abnormalities. The left ventricular internal cavity size was normal in size. There is  no left ventricular hypertrophy. Left ventricular diastolic function could not be evaluated due to atrial fibrillation. Left ventricular diastolic function could not be evaluated. Right Ventricle: The right ventricular size is normal. Right ventricular systolic function is normal. Tricuspid regurgitation signal is inadequate for assessing PA pressure. Left Atrium: Left atrial size was mildly dilated. Right Atrium: Right atrial size was normal in size. Pericardium: There is no evidence of pericardial effusion. Mitral Valve: The mitral valve is normal in structure. Normal mobility of the mitral valve leaflets. Trivial mitral valve regurgitation. No evidence of mitral valve stenosis. Tricuspid Valve: The tricuspid valve is normal in structure. Tricuspid valve regurgitation is mild . No evidence of tricuspid stenosis. Aortic Valve: The aortic valve is tricuspid. Aortic valve  regurgitation is not visualized. No aortic stenosis is present. Pulmonic Valve: The pulmonic valve was not well visualized.  Pulmonic valve regurgitation is trivial. No evidence of pulmonic stenosis. Aorta: The aortic root is normal in size and structure. Venous: The inferior vena cava is normal in size with greater than 50% respiratory variability, suggesting right atrial pressure of 3 mmHg.  Additional Comments: Normal LV function; mild LAE.  LEFT VENTRICLE PLAX 2D LVIDd:         4.00 cm LVIDs:         2.85 cm LV PW:         1.10 cm LV IVS:        1.13 cm LVOT diam:     1.85 cm LV SV:         40 LV SV Index:   19 LVOT Area:     2.69 cm  RIGHT VENTRICLE             IVC RV Basal diam:  2.82 cm     IVC diam: 1.67 cm RV S prime:     12.20 cm/s TAPSE (M-mode): 0.9 cm LEFT ATRIUM           Index       RIGHT ATRIUM           Index LA diam:      3.20 cm 1.49 cm/m  RA Area:     15.80 cm LA Vol (A2C): 74.0 ml 34.49 ml/m RA Volume:   39.80 ml  18.55 ml/m LA Vol (A4C): 61.6 ml 28.71 ml/m  AORTIC VALVE LVOT Vmax:   122.50 cm/s LVOT Vmean:  66.600 cm/s LVOT VTI:    0.150 m  AORTA Ao Root diam: 3.30 cm Ao Asc diam:  3.20 cm MITRAL VALVE MV Area (PHT): 3.62 cm    SHUNTS MV Decel Time: 210 msec    Systemic VTI:  0.15 m MV E velocity: 53.35 cm/s  Systemic Diam: 1.85 cm Kirk Ruths MD Electronically signed by Kirk Ruths MD Signature Date/Time: 10/29/2019/2:08:19 PM    Final    VAS [place] LOWER EXTREMITY VENOUS (DVT)  Result Date: 11/03/2019  Lower Venous DVTStudy Indications: Swelling.  Risk Factors: COVID 19 positive. Limitations: Body habitus and poor ultrasound/tissue interface. Comparison Study: No prior studies. Performing Technologist: Oliver Hum RVT  Examination Guidelines: A complete evaluation includes B-mode imaging, spectral Doppler, color Doppler, and power Doppler as needed of all accessible portions of each vessel. Bilateral testing is considered an integral part of a complete examination. Limited  examinations for reoccurring indications may be performed as noted. The reflux portion of the exam is performed with the patient in reverse Trendelenburg.  +---------+---------------+---------+-----------+----------+--------------+ RIGHT    CompressibilityPhasicitySpontaneityPropertiesThrombus Aging +---------+---------------+---------+-----------+----------+--------------+ CFV      Full           Yes      Yes                                 +---------+---------------+---------+-----------+----------+--------------+ SFJ      Full                                                        +---------+---------------+---------+-----------+----------+--------------+ FV Prox  Full                                                        +---------+---------------+---------+-----------+----------+--------------+  FV Mid   Full                                                        +---------+---------------+---------+-----------+----------+--------------+ FV DistalFull                                                        +---------+---------------+---------+-----------+----------+--------------+ PFV      Full                                                        +---------+---------------+---------+-----------+----------+--------------+ POP      Full           Yes      Yes                                 +---------+---------------+---------+-----------+----------+--------------+ PTV      Full                                                        +---------+---------------+---------+-----------+----------+--------------+ PERO     Full                                                        +---------+---------------+---------+-----------+----------+--------------+   +---------+---------------+---------+-----------+----------+--------------+ LEFT     CompressibilityPhasicitySpontaneityPropertiesThrombus Aging  +---------+---------------+---------+-----------+----------+--------------+ CFV      Full           Yes      Yes                                 +---------+---------------+---------+-----------+----------+--------------+ SFJ      Full                                                        +---------+---------------+---------+-----------+----------+--------------+ FV Prox  Full                                                        +---------+---------------+---------+-----------+----------+--------------+ FV Mid   Full                                                        +---------+---------------+---------+-----------+----------+--------------+   FV DistalFull                                                        +---------+---------------+---------+-----------+----------+--------------+ PFV      Full                                                        +---------+---------------+---------+-----------+----------+--------------+ POP      Full           Yes      Yes                                 +---------+---------------+---------+-----------+----------+--------------+ PTV      Full                                                        +---------+---------------+---------+-----------+----------+--------------+ PERO     Full                                                        +---------+---------------+---------+-----------+----------+--------------+     Summary: RIGHT: - There is no evidence of deep vein thrombosis in the lower extremity. However, portions of this examination were limited- see technologist comments above.  - No cystic structure found in the popliteal fossa.  LEFT: - There is no evidence of deep vein thrombosis in the lower extremity. However, portions of this examination were limited- see technologist comments above.  - No cystic structure found in the popliteal fossa.  *See table(s) above for measurements and observations.  Electronically signed by Monica Martinez MD on 11/03/2019 at 4:09:50 PM.    Final    [place] EKG SITE RITE  Result Date: 11/04/2019 If Site Rite image not attached, placement could not be confirmed due to current cardiac rhythm.    TODAY-DAY OF DISCHARGE:  Subjective:   Julia Mcfarland today has no headache,no chest abdominal pain,no new weakness tingling or numbness, feels much better wants to go home today.   Objective:   Blood pressure (!) 92/49, pulse 72, temperature 98.6 F (37 C), temperature source Oral, resp. rate 18, height 5\' 1"  (1.549 m), weight 124.8 kg, SpO2 97 %.  Intake/Output Summary (Last 24 hours) at 11/14/2019 1001 Last data filed at 11/14/2019 0852 Gross per 24 hour  Intake 840 ml  Output --  Net 840 ml   Filed Weights   11/12/19 0400 11/13/19 0447 11/14/19 0500  Weight: 124 kg 124.6 kg 124.8 kg    Exam: Awake Alert, Oriented *3, No new F.N deficits, Normal affect [place].AT,PERRAL Supple Neck,No JVD, No cervical lymphadenopathy appriciated.  Symmetrical Chest wall movement, Good air movement bilaterally, CTAB RRR,No Gallops,Rubs or new Murmurs, No Parasternal Heave +ve B.Sounds, Abd Soft, Non tender, No organomegaly appriciated, No rebound -guarding or rigidity. No  Cyanosis, Clubbing or edema, No new Rash or bruise   PERTINENT RADIOLOGIC STUDIES: DG Chest Port 1 View  Result Date: 11/05/2019 CLINICAL DATA:  Shortness of breath EXAM: PORTABLE CHEST 1 VIEW COMPARISON:  Nov 04, 2019 and Nov 02, 2019 FINDINGS: Central catheter tip is in the superior vena cava. No pneumothorax. Note that there is subcutaneous air in the supraclavicular regions, stable. There is also a degree of pneumomediastinum, less pronounced than on study from 3 days prior. There is airspace opacity bilaterally with consolidation in the right lower lobe. No new opacity evident. Heart size and pulmonary vascularity are normal. No adenopathy. There is degenerative change in thoracic spine. IMPRESSION:  Multifocal airspace opacity with consolidation right base, similar to recent studies. Stable cardiac silhouette. Central catheter tip in superior vena cava. No pneumothorax. However, there is pneumomediastinum as well as subcutaneous air in the supraclavicular regions, more on the right than the left. Electronically Signed   By: Lowella Grip III M.D.   On: 11/05/2019 09:11   DG Chest Port 1 View  Result Date: 11/04/2019 CLINICAL DATA:  PICC line placement EXAM: PORTABLE CHEST 1 VIEW COMPARISON:  11/04/2019 FINDINGS: Right PICC line is been placed. The tip is at the cavoatrial junction. Stable diffuse bilateral airspace disease. No visible effusion or pneumothorax. Subcutaneous emphysema again noted in the right neck base, stable. No acute bony abnormality. IMPRESSION: Right PICC line tip at the cavoatrial junction. Stable bilateral airspace disease. Electronically Signed   By: Rolm Baptise M.D.   On: 11/04/2019 18:46   DG Chest Port 1 View  Result Date: 11/04/2019 CLINICAL DATA:  Short of breath. EXAM: PORTABLE CHEST 1 VIEW COMPARISON:  11/02/2019 FINDINGS: Normal heart size. Extensive, bilateral pulmonary opacities are identified which appear unchanged from previous exam. No pneumothorax. Subcutaneous emphysema is identified within the right neck. Etiology indeterminate. IMPRESSION: 1. No change in aeration a lungs compared with previous exam. 2. Right neck subcu emphysema noted. Electronically Signed   By: Kerby Moors M.D.   On: 11/04/2019 09:33   DG Chest Port 1 View  Result Date: 11/02/2019 CLINICAL DATA:  Shortness of breath, COVID positive EXAM: PORTABLE CHEST 1 VIEW COMPARISON:  11/01/2019 FINDINGS: Diffuse bilateral pulmonary opacities. Worsening left lung aeration. No pneumothorax. Pneumomediastinum is similar. Stable cardiomediastinal contours. No significant pleural effusion. IMPRESSION: Bilateral pneumonia with worsening lung aeration on the left. Similar pneumomediastinum.  Electronically Signed   By: Macy Mis M.D.   On: 11/02/2019 07:04   DG Chest Port 1 View  Result Date: 11/01/2019 CLINICAL DATA:  Acute respiratory failure EXAM: PORTABLE CHEST 1 VIEW COMPARISON:  Yesterday FINDINGS: Pneumomediastinum is newly seen along both mediastinal contours. No visible pneumothorax. Extensive bilateral pneumonia. Cardiomegaly and aortic tortuosity. These results will be called to the ordering clinician or representative by the Radiologist Assistant, and communication documented in the PACS or [employer]. IMPRESSION: 1. New pneumomediastinum. 2. Stable bilateral pneumonia.  No visible pneumothorax. Electronically Signed   By: Monte Fantasia M.D.   On: 11/01/2019 07:21   DG CHEST PORT 1 VIEW  Result Date: 10/31/2019 CLINICAL DATA:  Tachypnea EXAM: PORTABLE CHEST 1 VIEW COMPARISON:  10/31/2019, 10/30/2019, 10/28/2019 FINDINGS: Rotated patient. Fairly extensive bilateral ground-glass opacities and consolidations probably without significant change allowing for rotated patient. Enlarged cardiomediastinal silhouette also exaggerated by rotation. No pleural effusion or pneumothorax. IMPRESSION: Likely no significant interval change in fairly extensive bilateral airspace disease and consolidations allowing for patient rotation Electronically Signed   By: Madie Reno.D.  On: 10/31/2019 19:56   DG Chest Port 1 View  Result Date: 10/31/2019 CLINICAL DATA:  Shortness of breath. COVID-19 viral pneumonia. EXAM: PORTABLE CHEST 1 VIEW COMPARISON:  02/21/2020 FINDINGS: Heart size remains within normal limits. Bilateral diffuse heterogeneous airspace opacity shows no significant change. No evidence of pneumothorax or pleural effusion. IMPRESSION: No significant change in diffuse heterogeneous airspace disease. Electronically Signed   By: Marlaine Hind M.D.   On: 10/31/2019 08:16   DG Chest Port 1 View  Result Date: 10/30/2019 CLINICAL DATA:  COVID positive. EXAM: PORTABLE CHEST 1  VIEW COMPARISON:  Chest x-ray dated October 28, 2019. FINDINGS: Stable cardiomediastinal silhouette. Normal pulmonary vascularity. Low lung volumes. Patchy interstitial and airspace opacities throughout both lungs, mildly worsened at the left lung base. No pleural effusion or pneumothorax. No acute osseous abnormality. IMPRESSION: 1. Multifocal pneumonia, mildly worsened at the left lung base. Electronically Signed   By: Titus Dubin M.D.   On: 10/30/2019 08:26   DG Chest Portable 1 View  Result Date: 10/28/2019 CLINICAL DATA:  Dyspnea with exertion. EXAM: PORTABLE CHEST 1 VIEW COMPARISON:  February 28, 2017. FINDINGS: Stable cardiomediastinal silhouette. Central pulmonary vascular congestion is noted. Bilateral lung opacities are noted which may represent edema or possibly multifocal pneumonia. Atherosclerosis of thoracic aorta is noted. No pneumothorax or pleural effusion is noted. Bony thorax is unremarkable. IMPRESSION: Aortic atherosclerosis. Central pulmonary vascular congestion is noted. Bilateral lung opacities are noted which may represent edema or possibly multifocal pneumonia. Aortic Atherosclerosis (ICD10-I70.0). Electronically Signed   By: Marijo Conception M.D.   On: 10/28/2019 15:42   ECHOCARDIOGRAM COMPLETE  Result Date: 10/29/2019    ECHOCARDIOGRAM REPORT   Patient Name:   TALITHIA ERCOLE Date of Exam: 10/29/2019 Medical Rec #:  [id number]      Height:       61.0 in Accession #:    [id number]     Weight:       270.0 lb Date of Birth:  May 06, 1946      BSA:          2.146 m Patient Age:    67 years       BP:           136/73 mmHg Patient Gender: F              HR:           135 bpm. Exam Location:  Inpatient Procedure: 2D Echo, Cardiac Doppler and Color Doppler Indications:    I48.0 Paroxysmal atrial fibrillation  History:        Patient has no prior history of Echocardiogram examinations.                 COVID-19 Positive.  Sonographer:    Jonelle Sidle Dance Referring Phys: Ozan  1. Normal LV function; mild LAE.  2. Left ventricular ejection fraction, by estimation, is 55 to 60%. The left ventricle has normal function. The left ventricle has no regional wall motion abnormalities. Left ventricular diastolic function could not be evaluated.  3. Right ventricular systolic function is normal. The right ventricular size is normal. Tricuspid regurgitation signal is inadequate for assessing PA pressure.  4. Left atrial size was mildly dilated.  5. The mitral valve is normal in structure. Trivial mitral valve regurgitation. No evidence of mitral stenosis.  6. The aortic valve is tricuspid. Aortic valve regurgitation is not visualized. No aortic stenosis is present.  7. The inferior vena cava is  normal in size with greater than 50% respiratory variability, suggesting right atrial pressure of 3 mmHg. FINDINGS  Left Ventricle: Left ventricular ejection fraction, by estimation, is 55 to 60%. The left ventricle has normal function. The left ventricle has no regional wall motion abnormalities. The left ventricular internal cavity size was normal in size. There is  no left ventricular hypertrophy. Left ventricular diastolic function could not be evaluated due to atrial fibrillation. Left ventricular diastolic function could not be evaluated. Right Ventricle: The right ventricular size is normal. Right ventricular systolic function is normal. Tricuspid regurgitation signal is inadequate for assessing PA pressure. Left Atrium: Left atrial size was mildly dilated. Right Atrium: Right atrial size was normal in size. Pericardium: There is no evidence of pericardial effusion. Mitral Valve: The mitral valve is normal in structure. Normal mobility of the mitral valve leaflets. Trivial mitral valve regurgitation. No evidence of mitral valve stenosis. Tricuspid Valve: The tricuspid valve is normal in structure. Tricuspid valve regurgitation is mild . No evidence of tricuspid stenosis. Aortic Valve: The  aortic valve is tricuspid. Aortic valve regurgitation is not visualized. No aortic stenosis is present. Pulmonic Valve: The pulmonic valve was not well visualized. Pulmonic valve regurgitation is trivial. No evidence of pulmonic stenosis. Aorta: The aortic root is normal in size and structure. Venous: The inferior vena cava is normal in size with greater than 50% respiratory variability, suggesting right atrial pressure of 3 mmHg.  Additional Comments: Normal LV function; mild LAE.  LEFT VENTRICLE PLAX 2D LVIDd:         4.00 cm LVIDs:         2.85 cm LV PW:         1.10 cm LV IVS:        1.13 cm LVOT diam:     1.85 cm LV SV:         40 LV SV Index:   19 LVOT Area:     2.69 cm  RIGHT VENTRICLE             IVC RV Basal diam:  2.82 cm     IVC diam: 1.67 cm RV S prime:     12.20 cm/s TAPSE (M-mode): 0.9 cm LEFT ATRIUM           Index       RIGHT ATRIUM           Index LA diam:      3.20 cm 1.49 cm/m  RA Area:     15.80 cm LA Vol (A2C): 74.0 ml 34.49 ml/m RA Volume:   39.80 ml  18.55 ml/m LA Vol (A4C): 61.6 ml 28.71 ml/m  AORTIC VALVE LVOT Vmax:   122.50 cm/s LVOT Vmean:  66.600 cm/s LVOT VTI:    0.150 m  AORTA Ao Root diam: 3.30 cm Ao Asc diam:  3.20 cm MITRAL VALVE MV Area (PHT): 3.62 cm    SHUNTS MV Decel Time: 210 msec    Systemic VTI:  0.15 m MV E velocity: 53.35 cm/s  Systemic Diam: 1.85 cm Kirk Ruths MD Electronically signed by Kirk Ruths MD Signature Date/Time: 10/29/2019/2:08:19 PM    Final    VAS [place] LOWER EXTREMITY VENOUS (DVT)  Result Date: 11/03/2019  Lower Venous DVTStudy Indications: Swelling.  Risk Factors: COVID 19 positive. Limitations: Body habitus and poor ultrasound/tissue interface. Comparison Study: No prior studies. Performing Technologist: Oliver Hum RVT  Examination Guidelines: A complete evaluation includes B-mode imaging, spectral Doppler, color Doppler, and power Doppler as needed  of all accessible portions of each vessel. Bilateral testing is considered an integral part  of a complete examination. Limited examinations for reoccurring indications may be performed as noted. The reflux portion of the exam is performed with the patient in reverse Trendelenburg.  +---------+---------------+---------+-----------+----------+--------------+ RIGHT    CompressibilityPhasicitySpontaneityPropertiesThrombus Aging +---------+---------------+---------+-----------+----------+--------------+ CFV      Full           Yes      Yes                                 +---------+---------------+---------+-----------+----------+--------------+ SFJ      Full                                                        +---------+---------------+---------+-----------+----------+--------------+ FV Prox  Full                                                        +---------+---------------+---------+-----------+----------+--------------+ FV Mid   Full                                                        +---------+---------------+---------+-----------+----------+--------------+ FV DistalFull                                                        +---------+---------------+---------+-----------+----------+--------------+ PFV      Full                                                        +---------+---------------+---------+-----------+----------+--------------+ POP      Full           Yes      Yes                                 +---------+---------------+---------+-----------+----------+--------------+ PTV      Full                                                        +---------+---------------+---------+-----------+----------+--------------+ PERO     Full                                                        +---------+---------------+---------+-----------+----------+--------------+   +---------+---------------+---------+-----------+----------+--------------+ LEFT  CompressibilityPhasicitySpontaneityPropertiesThrombus Aging  +---------+---------------+---------+-----------+----------+--------------+ CFV      Full           Yes      Yes                                 +---------+---------------+---------+-----------+----------+--------------+ SFJ      Full                                                        +---------+---------------+---------+-----------+----------+--------------+ FV Prox  Full                                                        +---------+---------------+---------+-----------+----------+--------------+ FV Mid   Full                                                        +---------+---------------+---------+-----------+----------+--------------+ FV DistalFull                                                        +---------+---------------+---------+-----------+----------+--------------+ PFV      Full                                                        +---------+---------------+---------+-----------+----------+--------------+ POP      Full           Yes      Yes                                 +---------+---------------+---------+-----------+----------+--------------+ PTV      Full                                                        +---------+---------------+---------+-----------+----------+--------------+ PERO     Full                                                        +---------+---------------+---------+-----------+----------+--------------+     Summary: RIGHT: - There is no evidence of deep vein thrombosis in the lower extremity. However, portions of this examination were limited- see technologist comments above.  - No cystic structure found in the popliteal fossa.  LEFT: - There is no evidence of deep vein thrombosis in  the lower extremity. However, portions of this examination were limited- see technologist comments above.  - No cystic structure found in the popliteal fossa.  *See table(s) above for measurements and observations.  Electronically signed by Monica Martinez MD on 11/03/2019 at 4:09:50 PM.    Final    [place] EKG SITE RITE  Result Date: 11/04/2019 If Site Rite image not attached, placement could not be confirmed due to current cardiac rhythm.    PERTINENT LAB RESULTS: CBC: Recent Labs    11/12/19 0254 11/13/19 0458  WBC 5.9 4.9  HGB 10.7* 10.4*  HCT 33.7* 33.2*  PLT 208 186   CMET CMP     Component Value Date/Time   NA 142 11/13/2019 0458   K 3.6 11/13/2019 0458   CL 108 11/13/2019 0458   CO2 27 11/13/2019 0458   GLUCOSE 107 (H) 11/13/2019 0458   BUN 12 11/13/2019 0458   CREATININE 1.04 (H) 11/13/2019 0458   CALCIUM 8.2 (L) 11/13/2019 0458   PROT 5.2 (L) 11/07/2019 0258   ALBUMIN 2.5 (L) 11/07/2019 0258   AST 31 11/07/2019 0258   ALT 28 11/07/2019 0258   ALKPHOS 53 11/07/2019 0258   BILITOT 1.1 11/07/2019 0258   GFRNONAA 54 (L) 11/13/2019 0458   GFRAA >60 11/13/2019 0458    GFR Estimated Creatinine Clearance: 60.7 mL/min (A) (by C-G formula based on SCr of 1.04 mg/dL (H)). No results for input(s): LIPASE, AMYLASE in the last 72 hours. No results for input(s): CKTOTAL, CKMB, CKMBINDEX, TROPONINI in the last 72 hours. Invalid input(s): POCBNP No results for input(s): DDIMER in the last 72 hours. No results for input(s): HGBA1C in the last 72 hours. No results for input(s): CHOL, HDL, LDLCALC, TRIG, CHOLHDL, LDLDIRECT in the last 72 hours. No results for input(s): TSH, T4TOTAL, T3FREE, THYROIDAB in the last 72 hours.  Invalid input(s): FREET3 No results for input(s): VITAMINB12, FOLATE, FERRITIN, TIBC, IRON, RETICCTPCT in the last 72 hours. Coags: No results for input(s): INR in the last 72 hours.  Invalid input(s): PT Microbiology: No results found for this or any previous visit (from the past 240 hour(s)).  FURTHER DISCHARGE INSTRUCTIONS:  Get Medicines reviewed and adjusted: Please take all your medications with you for your next visit with your Primary  MD  Laboratory/radiological data: Please request your Primary MD to go over all hospital tests and procedure/radiological results at the follow up, please ask your Primary MD to get all Hospital records sent to his/her office.  In some cases, they will be blood work, cultures and biopsy results pending at the time of your discharge. Please request that your primary care M.D. goes through all the records of your hospital data and follows up on these results.  Also Note the following: If you experience worsening of your admission symptoms, develop shortness of breath, life threatening emergency, suicidal or homicidal thoughts you must seek medical attention immediately by calling 911 or calling your MD immediately  if symptoms less severe.  You must read complete instructions/literature along with all the possible adverse reactions/side effects for all the Medicines you take and that have been prescribed to you. Take any new Medicines after you have completely understood and accpet all the possible adverse reactions/side effects.   Do not drive when taking Pain medications or sleeping medications (Benzodaizepines)  Do not take more than prescribed Pain, Sleep and Anxiety Medications. It is not advisable to combine anxiety,sleep and pain medications without talking with your primary care practitioner  Special Instructions: If  you have smoked or chewed Tobacco  in the last 2 yrs please stop smoking, stop any regular Alcohol  and or any Recreational drug use.  Wear Seat belts while driving.  Please note: You were cared for by a hospitalist during your hospital stay. Once you are discharged, your primary care physician will handle any further medical issues. Please note that NO REFILLS for any discharge medications will be authorized once you are discharged, as it is imperative that you return to your primary care physician (or establish a relationship with a primary care physician if you do not have  one) for your post hospital discharge needs so that they can reassess your need for medications and monitor your lab values.  Total Time spent coordinating discharge including counseling, education and face to face time equals 35 minutes.  SignedOren Binet 11/14/2019 10:01 AM

## 2019-11-14 NOTE — Telephone Encounter (Signed)
noted 

## 2019-11-15 ENCOUNTER — Telehealth: Payer: Self-pay | Admitting: Cardiovascular Disease

## 2019-11-15 NOTE — Telephone Encounter (Signed)
New Message     Pt has TOC appt 11/27/19 at 2:15pm with Roby Lofts

## 2019-11-15 NOTE — Telephone Encounter (Signed)
Patient contacted regarding discharge from [facility] on 11/14/19.  Patient understands to follow up with provider -Kroeger PA-C on 11/27/19 at 2:15 pm  at [facility].  Direction given Patient understands discharge instructions? yes  Patient understands medications and regiment? yes  Patient understands to bring all medications to this visit? yes

## 2019-11-16 DIAGNOSIS — E1151 Type 2 diabetes mellitus with diabetic peripheral angiopathy without gangrene: Secondary | ICD-10-CM | POA: Diagnosis not present

## 2019-11-16 DIAGNOSIS — J1282 Pneumonia due to coronavirus disease 2019: Secondary | ICD-10-CM | POA: Diagnosis not present

## 2019-11-16 DIAGNOSIS — I088 Other rheumatic multiple valve diseases: Secondary | ICD-10-CM | POA: Diagnosis not present

## 2019-11-16 DIAGNOSIS — J9601 Acute respiratory failure with hypoxia: Secondary | ICD-10-CM | POA: Diagnosis not present

## 2019-11-16 DIAGNOSIS — I10 Essential (primary) hypertension: Secondary | ICD-10-CM | POA: Diagnosis not present

## 2019-11-16 DIAGNOSIS — U071 COVID-19: Secondary | ICD-10-CM | POA: Diagnosis not present

## 2019-11-16 DIAGNOSIS — I5031 Acute diastolic (congestive) heart failure: Secondary | ICD-10-CM | POA: Diagnosis not present

## 2019-11-16 DIAGNOSIS — J982 Interstitial emphysema: Secondary | ICD-10-CM | POA: Diagnosis not present

## 2019-11-16 DIAGNOSIS — I0981 Rheumatic heart failure: Secondary | ICD-10-CM | POA: Diagnosis not present

## 2019-11-16 DIAGNOSIS — I480 Paroxysmal atrial fibrillation: Secondary | ICD-10-CM | POA: Diagnosis not present

## 2019-11-18 ENCOUNTER — Telehealth: Payer: Self-pay

## 2019-11-18 DIAGNOSIS — J1282 Pneumonia due to coronavirus disease 2019: Secondary | ICD-10-CM | POA: Diagnosis not present

## 2019-11-18 DIAGNOSIS — E1151 Type 2 diabetes mellitus with diabetic peripheral angiopathy without gangrene: Secondary | ICD-10-CM | POA: Diagnosis not present

## 2019-11-18 DIAGNOSIS — I5031 Acute diastolic (congestive) heart failure: Secondary | ICD-10-CM | POA: Diagnosis not present

## 2019-11-18 DIAGNOSIS — I088 Other rheumatic multiple valve diseases: Secondary | ICD-10-CM | POA: Diagnosis not present

## 2019-11-18 DIAGNOSIS — I0981 Rheumatic heart failure: Secondary | ICD-10-CM | POA: Diagnosis not present

## 2019-11-18 DIAGNOSIS — U071 COVID-19: Secondary | ICD-10-CM | POA: Diagnosis not present

## 2019-11-18 DIAGNOSIS — I480 Paroxysmal atrial fibrillation: Secondary | ICD-10-CM | POA: Diagnosis not present

## 2019-11-18 DIAGNOSIS — J982 Interstitial emphysema: Secondary | ICD-10-CM | POA: Diagnosis not present

## 2019-11-18 DIAGNOSIS — J9601 Acute respiratory failure with hypoxia: Secondary | ICD-10-CM | POA: Diagnosis not present

## 2019-11-18 NOTE — Telephone Encounter (Signed)
LVM on number left

## 2019-11-18 NOTE — Telephone Encounter (Signed)
Roselie Awkward, PT from [place], returned call. He reports pt was sent home with PT and OT orders for strengthening, balance and endurance. He wanted to know who signs the plans of care for [facility]. Advised Dr. Silvio Pate would normally sign but is not in the office this week. Advised to fax it and we could find another provider to sign. He reports he will not need to send until the end of the week but he will send then.

## 2019-11-18 NOTE — Telephone Encounter (Signed)
[place] Night - Client Nonclinical Telephone Record  [facility] Client [place] Night - Client Client Site [place] Physician Webb Silversmith - NP Contact Type Call Who Is Calling Physician / Provider / Hospital Call Type Provider Call Message Only Reason for Call Request to send message to Office Initial Comment Caller is needing to leave a message for the office. He is from [facility] in regards to patient last name Julia Mcfarland Nov 27, 1945. Patient came home from hospital with home health PT and OT orders the patient is being evaluated today. Additional Comment Patient O2 saturation drops with minimal exertion but recovers when resting. Return number is [phone]. Caller states he would like to know who is the signing MD for [facility]. Disp. Time Disposition Final User 11/16/2019 11:11:16 AM General Information Provided Yes [place], [place] Call Closed By: Julia Mcfarland Transaction Date/Time: 11/16/2019 11:07:18 AM (ET)

## 2019-11-19 DIAGNOSIS — I5031 Acute diastolic (congestive) heart failure: Secondary | ICD-10-CM | POA: Diagnosis not present

## 2019-11-19 DIAGNOSIS — J9601 Acute respiratory failure with hypoxia: Secondary | ICD-10-CM | POA: Diagnosis not present

## 2019-11-19 DIAGNOSIS — J1282 Pneumonia due to coronavirus disease 2019: Secondary | ICD-10-CM | POA: Diagnosis not present

## 2019-11-19 DIAGNOSIS — J982 Interstitial emphysema: Secondary | ICD-10-CM | POA: Diagnosis not present

## 2019-11-19 DIAGNOSIS — I088 Other rheumatic multiple valve diseases: Secondary | ICD-10-CM | POA: Diagnosis not present

## 2019-11-19 DIAGNOSIS — I480 Paroxysmal atrial fibrillation: Secondary | ICD-10-CM | POA: Diagnosis not present

## 2019-11-19 DIAGNOSIS — I0981 Rheumatic heart failure: Secondary | ICD-10-CM | POA: Diagnosis not present

## 2019-11-19 DIAGNOSIS — U071 COVID-19: Secondary | ICD-10-CM | POA: Diagnosis not present

## 2019-11-19 DIAGNOSIS — E1151 Type 2 diabetes mellitus with diabetic peripheral angiopathy without gangrene: Secondary | ICD-10-CM | POA: Diagnosis not present

## 2019-11-22 DIAGNOSIS — J982 Interstitial emphysema: Secondary | ICD-10-CM | POA: Diagnosis not present

## 2019-11-22 DIAGNOSIS — E1151 Type 2 diabetes mellitus with diabetic peripheral angiopathy without gangrene: Secondary | ICD-10-CM | POA: Diagnosis not present

## 2019-11-22 DIAGNOSIS — J1282 Pneumonia due to coronavirus disease 2019: Secondary | ICD-10-CM | POA: Diagnosis not present

## 2019-11-22 DIAGNOSIS — I5031 Acute diastolic (congestive) heart failure: Secondary | ICD-10-CM | POA: Diagnosis not present

## 2019-11-22 DIAGNOSIS — U071 COVID-19: Secondary | ICD-10-CM | POA: Diagnosis not present

## 2019-11-22 DIAGNOSIS — I088 Other rheumatic multiple valve diseases: Secondary | ICD-10-CM | POA: Diagnosis not present

## 2019-11-22 DIAGNOSIS — J9601 Acute respiratory failure with hypoxia: Secondary | ICD-10-CM | POA: Diagnosis not present

## 2019-11-22 DIAGNOSIS — I480 Paroxysmal atrial fibrillation: Secondary | ICD-10-CM | POA: Diagnosis not present

## 2019-11-22 DIAGNOSIS — I0981 Rheumatic heart failure: Secondary | ICD-10-CM | POA: Diagnosis not present

## 2019-11-25 ENCOUNTER — Other Ambulatory Visit: Payer: Self-pay

## 2019-11-25 ENCOUNTER — Encounter: Payer: Self-pay | Admitting: Internal Medicine

## 2019-11-25 ENCOUNTER — Ambulatory Visit (INDEPENDENT_AMBULATORY_CARE_PROVIDER_SITE_OTHER)
Admission: RE | Admit: 2019-11-25 | Discharge: 2019-11-25 | Disposition: A | Discharge status: Home / Self Care | Payer: Medicare HMO | Source: Ambulatory Visit | Attending: Internal Medicine | Admitting: Internal Medicine

## 2019-11-25 ENCOUNTER — Ambulatory Visit (INDEPENDENT_AMBULATORY_CARE_PROVIDER_SITE_OTHER): Payer: Medicare HMO | Admitting: Internal Medicine

## 2019-11-25 VITALS — BP 126/84 | HR 72 | Temp 98.8°F | Wt 277.0 lb

## 2019-11-25 DIAGNOSIS — R0602 Shortness of breath: Secondary | ICD-10-CM | POA: Diagnosis not present

## 2019-11-25 DIAGNOSIS — U071 COVID-19: Secondary | ICD-10-CM

## 2019-11-25 DIAGNOSIS — I4811 Longstanding persistent atrial fibrillation: Secondary | ICD-10-CM

## 2019-11-25 DIAGNOSIS — E876 Hypokalemia: Secondary | ICD-10-CM | POA: Diagnosis not present

## 2019-11-25 DIAGNOSIS — J1282 Pneumonia due to coronavirus disease 2019: Secondary | ICD-10-CM

## 2019-11-25 DIAGNOSIS — J982 Interstitial emphysema: Secondary | ICD-10-CM

## 2019-11-25 DIAGNOSIS — R040 Epistaxis: Secondary | ICD-10-CM | POA: Diagnosis not present

## 2019-11-25 LAB — CBC
HCT: 36.2 % (ref 36.0–46.0)
Hemoglobin: 12 g/dL (ref 12.0–15.0)
MCHC: 33.1 g/dL (ref 30.0–36.0)
MCV: 96.8 fl (ref 78.0–100.0)
Platelets: 190 10*3/uL (ref 150.0–400.0)
RBC: 3.74 Mil/uL — ABNORMAL LOW (ref 3.87–5.11)
RDW: 18.9 % — ABNORMAL HIGH (ref 11.5–15.5)
WBC: 5.5 10*3/uL (ref 4.0–10.5)

## 2019-11-25 LAB — BASIC METABOLIC PANEL
BUN: 9 mg/dL (ref 6–23)
CO2: 25 mEq/L (ref 19–32)
Calcium: 8.6 mg/dL (ref 8.4–10.5)
Chloride: 108 mEq/L (ref 96–112)
Creatinine, Ser: 0.89 mg/dL (ref 0.40–1.20)
GFR: 75.28 mL/min (ref >60.00)
Glucose, Bld: 145 mg/dL — ABNORMAL HIGH (ref 70–99)
Potassium: 3.1 mEq/L — ABNORMAL LOW (ref 3.5–5.1)
Sodium: 144 mEq/L (ref 135–145)

## 2019-11-25 NOTE — Progress Notes (Signed)
Subjective:    Patient ID: Julia Mcfarland, female    DOB: 1946/01/01, 74 y.o.   MRN: [id number]  HPI   Patient presents to the clinic today for TCM hospital follow-up.  She went to the ER 4/26 with complaint of elevated heart rate.  She was found in A. fib with RVR.  Chest x-ray was concerning for viral pneumonia.  She did test positive for Covid.  She ended up being transferred to the ICU for high flow nasal cannula.  She developed a pneumomediastinum and epistaxis requiring Rhino Rocket. She had a normal echocardiogram, negative DVT work-up.  Cardiology was consulted and her Cardizem was changed to Amiodarone.  She was transition from Lovenox to Eliquis.  She was treated with Doxycycline, Remdesivir, Steroids and Acterma.   She was discharged on 5/13.  Since discharge, she reports overall, she is feeling better. She is working with PT. She is short of breath. She is under some stress as her husband is in ICU on the ventilator.  Review of Systems      Past Medical History:  Diagnosis Date  . Allergy   . Arthritis   . Chicken pox   . Chronic venous insufficiency    a. Uses furosemide prn.  . Degenerative joint disease   . Morbid obesity ([place])   . Osteoarthritis   . PONV (postoperative nausea and vomiting)     Current Outpatient Medications  Medication Sig Dispense Refill  . albuterol (VENTOLIN HFA) 108 (90 Base) MCG/ACT inhaler Inhale 2 puffs into the lungs every 6 (six) hours as needed for wheezing or shortness of breath. 8 g 1  . amiodarone (PACERONE) 200 MG tablet Take 1 tablet (200 mg total) by mouth daily. 30 tablet 0  . apixaban (ELIQUIS) 5 MG TABS tablet Take 1 tablet (5 mg total) by mouth 2 (two) times daily. 60 tablet 0  . Cholecalciferol (VITAMIN D3) 5000 UNITS CAPS Take 5,000 Units by mouth daily.     . fexofenadine (ALLEGRA ALLERGY) 180 MG tablet Take 1 tablet (180 mg total) by mouth daily. 90 tablet 2  . furosemide (LASIX) 20 MG tablet Take 1 tablet (20 mg total) by  mouth daily. 30 tablet 0  . hydroxypropyl methylcellulose / hypromellose (ISOPTO TEARS / GONIOVISC) 2.5 % ophthalmic solution Place 1 drop into both eyes daily as needed for dry eyes.    . vitamin B-12 (CYANOCOBALAMIN) 500 MCG tablet Take 500 mcg by mouth daily.     No current facility-administered medications for this visit.    No Known Allergies  Family History  Problem Relation Age of Onset  . Arthritis Mother   . Heart disease Mother   . Hypertension Mother   . Diabetes Mother   . Stroke Father   . Hypertension Father   . Stroke Sister   . Hypertension Sister   . Heart disease Maternal Grandfather   . Diabetes Paternal Grandmother   . Heart disease Paternal Grandfather     Social History   Socioeconomic History  . Marital status: Married    Spouse name: Not on file  . Number of children: Not on file  . Years of education: Not on file  . Highest education level: Not on file  Occupational History  . Not on file  Tobacco Use  . Smoking status: Never Smoker  . Smokeless tobacco: Never Used  Substance and Sexual Activity  . Alcohol use: Yes    Comment: occasional  . Drug use: No  .  Sexual activity: Never  Other Topics Concern  . Not on file  Social History Narrative   Lives in [place] w/ husband.  Says she's relatively active but does not routinely exercise.   Social Determinants of Health   Financial Resource Strain:   . Difficulty of Paying Living Expenses:   Food Insecurity:   . Worried About Charity fundraiser in the Last Year:   . Arboriculturist in the Last Year:   Transportation Needs:   . Film/video editor (Medical):   Marland Kitchen Lack of Transportation (Non-Medical):   Physical Activity:   . Days of Exercise per Week:   . Minutes of Exercise per Session:   Stress:   . Feeling of Stress :   Social Connections:   . Frequency of Communication with Friends and Family:   . Frequency of Social Gatherings with Friends and Family:   . Attends  Religious Services:   . Active Member of Clubs or Organizations:   . Attends Archivist Meetings:   Marland Kitchen Marital Status:   Intimate Partner Violence:   . Fear of Current or Ex-Partner:   . Emotionally Abused:   Marland Kitchen Physically Abused:   . Sexually Abused:      Constitutional: Pt reports fatigue. Denies fever, malaise, headache or abrupt weight changes.  HEENT: Denies eye pain, eye redness, ear pain, ringing in the ears, wax buildup, runny nose, nasal congestion, bloody nose, or sore throat. Respiratory: Pt reports shortness of breath. Denies difficulty breathing, cough or sputum production.   Cardiovascular: Denies chest pain, chest tightness, palpitations or swelling in the hands or feet.  Gastrointestinal: Denies abdominal pain, bloating, constipation, diarrhea or blood in the stool.  GU: Denies urgency, frequency, pain with urination, burning sensation, blood in urine, odor or discharge. Skin: Denies redness, rashes, lesions or ulcercations.  Neurological: Denies dizziness, difficulty with memory, difficulty with speech or problems with balance and coordination.    No other specific complaints in a complete review of systems (except as listed in HPI above).  Objective:   Physical Exam   BP 126/84   Pulse 72   Temp 98.8 F (37.1 C) (Temporal)   Wt 277 lb (125.6 kg)   SpO2 96%   BMI 52.34 kg/m   Wt Readings from Last 3 Encounters:  11/14/19 275 lb 2.2 oz (124.8 kg)  09/19/19 291 lb (132 kg)  02/18/19 282 lb (127.9 kg)    General: Appears her stated age, obese, in NAD. Skin: Warm, dry and intact. No rashes noted. Cardiovascular: Normal rate and rhythm. S1,S2 noted.  No murmur, rubs or gallops noted. 2 + BLE edema.  Pulmonary/Chest: Normal effort and positive vesicular breath sounds. No respiratory distress. No wheezes, rales or ronchi noted.  Musculoskeletal: Gait slow and steady with the use of a can.e Neurological: Alert and oriented.     BMET    Component  Value Date/Time   NA 142 11/13/2019 0458   K 3.6 11/13/2019 0458   CL 108 11/13/2019 0458   CO2 27 11/13/2019 0458   GLUCOSE 107 (H) 11/13/2019 0458   BUN 12 11/13/2019 0458   CREATININE 1.04 (H) 11/13/2019 0458   CALCIUM 8.2 (L) 11/13/2019 0458   GFRNONAA 54 (L) 11/13/2019 0458   GFRAA >60 11/13/2019 0458    Lipid Panel     Component Value Date/Time   CHOL 133 10/29/2019 0450   TRIG 84 10/29/2019 0450   HDL 26 (L) 10/29/2019 0450   CHOLHDL 5.1 10/29/2019  0450   VLDL 17 10/29/2019 0450   LDLCALC 90 10/29/2019 0450    CBC    Component Value Date/Time   WBC 4.9 11/13/2019 0458   RBC 3.40 (L) 11/13/2019 0458   HGB 10.4 (L) 11/13/2019 0458   HCT 33.2 (L) 11/13/2019 0458   PLT 186 11/13/2019 0458   MCV 97.6 11/13/2019 0458   MCH 30.6 11/13/2019 0458   MCHC 31.3 11/13/2019 0458   RDW 14.4 11/13/2019 0458   LYMPHSABS 1.7 08/21/2008 1405   MONOABS 0.7 08/21/2008 1405   EOSABS 0.2 08/21/2008 1405   BASOSABS 0.1 08/21/2008 1405    Hgb A1C Lab Results  Component Value Date   HGBA1C 6.3 (H) 10/28/2019           Assessment & Plan:   [facility] Follow Up for Covid 19, Viral Pneumonia, Pneumomediastinum, Epistaxis, Afib with RVR:  Hospital notes, labs and imaging reviewed Will have her take Lasix BID x days for swelling CBC, CMET today Chest xray today Advised her to follow up with cardiology as requested  Will follow up after labs, return precautions discussed  Webb Silversmith, NP This visit occurred during the SARS-CoV-2 public health emergency.  Safety protocols were in place, including screening questions prior to the visit, additional usage of staff PPE, and extensive cleaning of exam room while observing appropriate contact time as indicated for disinfecting solutions.

## 2019-11-25 NOTE — Patient Instructions (Signed)
COVID-19 COVID-19 is a respiratory infection that is caused by a virus called severe acute respiratory syndrome coronavirus 2 (SARS-CoV-2). The disease is also known as coronavirus disease or novel coronavirus. In some people, the virus may not cause any symptoms. In others, it may cause a serious infection. The infection can get worse quickly and can lead to complications, such as:  Pneumonia, or infection of the lungs.  Acute respiratory distress syndrome or ARDS. This is a condition in which fluid build-up in the lungs prevents the lungs from filling with air and passing oxygen into the blood.  Acute respiratory failure. This is a condition in which there is not enough oxygen passing from the lungs to the body or when carbon dioxide is not passing from the lungs out of the body.  Sepsis or septic shock. This is a serious bodily reaction to an infection.  Blood clotting problems.  Secondary infections due to bacteria or fungus.  Organ failure. This is when your body's organs stop working. The virus that causes COVID-19 is contagious. This means that it can spread from person to person through droplets from coughs and sneezes (respiratory secretions). What are the causes? This illness is caused by a virus. You may catch the virus by:  Breathing in droplets from an infected person. Droplets can be spread by a person breathing, speaking, singing, coughing, or sneezing.  Touching something, like a table or a doorknob, that was exposed to the virus (contaminated) and then touching your mouth, nose, or eyes. What increases the risk? Risk for infection You are more likely to be infected with this virus if you:  Are within 6 feet (2 meters) of a person with COVID-19.  Provide care for or live with a person who is infected with COVID-19.  Spend time in crowded indoor spaces or live in shared housing. Risk for serious illness You are more likely to become seriously ill from the virus if you:   Are 50 years of age or older. The higher your age, the more you are at risk for serious illness.  Live in a nursing home or long-term care facility.  Have cancer.  Have a long-term (chronic) disease such as: ? Chronic lung disease, including chronic obstructive pulmonary disease or asthma. ? A long-term disease that lowers your body's ability to fight infection (immunocompromised). ? Heart disease, including heart failure, a condition in which the arteries that lead to the heart become narrow or blocked (coronary artery disease), a disease which makes the heart muscle thick, weak, or stiff (cardiomyopathy). ? Diabetes. ? Chronic kidney disease. ? Sickle cell disease, a condition in which red blood cells have an abnormal "sickle" shape. ? Liver disease.  Are obese. What are the signs or symptoms? Symptoms of this condition can range from mild to severe. Symptoms may appear any time from 2 to 14 days after being exposed to the virus. They include:  A fever or chills.  A cough.  Difficulty breathing.  Headaches, body aches, or muscle aches.  Runny or stuffy (congested) nose.  A sore throat.  New loss of taste or smell. Some people may also have stomach problems, such as nausea, vomiting, or diarrhea. Other people may not have any symptoms of COVID-19. How is this diagnosed? This condition may be diagnosed based on:  Your signs and symptoms, especially if: ? You live in an area with a COVID-19 outbreak. ? You recently traveled to or from an area where the virus is common. ? You   provide care for or live with a person who was diagnosed with COVID-19. ? You were exposed to a person who was diagnosed with COVID-19.  A physical exam.  Lab tests, which may include: ? Taking a sample of fluid from the back of your nose and throat (nasopharyngeal fluid), your nose, or your throat using a swab. ? A sample of mucus from your lungs (sputum). ? Blood tests.  Imaging tests, which  may include, X-rays, CT scan, or ultrasound. How is this treated? At present, there is no medicine to treat COVID-19. Medicines that treat other diseases are being used on a trial basis to see if they are effective against COVID-19. Your health care provider will talk with you about ways to treat your symptoms. For most people, the infection is mild and can be managed at home with rest, fluids, and over-the-counter medicines. Treatment for a serious infection usually takes places in a hospital intensive care unit (ICU). It may include one or more of the following treatments. These treatments are given until your symptoms improve.  Receiving fluids and medicines through an IV.  Supplemental oxygen. Extra oxygen is given through a tube in the nose, a face mask, or a hood.  Positioning you to lie on your stomach (prone position). This makes it easier for oxygen to get into the lungs.  Continuous positive airway pressure (CPAP) or bi-level positive airway pressure (BPAP) machine. This treatment uses mild air pressure to keep the airways open. A tube that is connected to a motor delivers oxygen to the body.  Ventilator. This treatment moves air into and out of the lungs by using a tube that is placed in your windpipe.  Tracheostomy. This is a procedure to create a hole in the neck so that a breathing tube can be inserted.  Extracorporeal membrane oxygenation (ECMO). This procedure gives the lungs a chance to recover by taking over the functions of the heart and lungs. It supplies oxygen to the body and removes carbon dioxide. Follow these instructions at home: Lifestyle  If you are sick, stay home except to get medical care. Your health care provider will tell you how long to stay home. Call your health care provider before you go for medical care.  Rest at home as told by your health care provider.  Do not use any products that contain nicotine or tobacco, such as cigarettes, e-cigarettes, and  chewing tobacco. If you need help quitting, ask your health care provider.  Return to your normal activities as told by your health care provider. Ask your health care provider what activities are safe for you. General instructions  Take over-the-counter and prescription medicines only as told by your health care provider.  Drink enough fluid to keep your urine pale yellow.  Keep all follow-up visits as told by your health care provider. This is important. How is this prevented?  There is no vaccine to help prevent COVID-19 infection. However, there are steps you can take to protect yourself and others from this virus. To protect yourself:   Do not travel to areas where COVID-19 is a risk. The areas where COVID-19 is reported change often. To identify high-risk areas and travel restrictions, check the CDC travel website: wwwnc.cdc.gov/travel/notices  If you live in, or must travel to, an area where COVID-19 is a risk, take precautions to avoid infection. ? Stay away from people who are sick. ? Wash your hands often with soap and water for 20 seconds. If soap and water   are not available, use an alcohol-based hand sanitizer. ? Avoid touching your mouth, face, eyes, or nose. ? Avoid going out in public, follow guidance from your state and local health authorities. ? If you must go out in public, wear a cloth face covering or face mask. Make sure your mask covers your nose and mouth. ? Avoid crowded indoor spaces. Stay at least 6 feet (2 meters) away from others. ? Disinfect objects and surfaces that are frequently touched every day. This may include:  Counters and tables.  Doorknobs and light switches.  Sinks and faucets.  Electronics, such as phones, remote controls, keyboards, computers, and tablets. To protect others: If you have symptoms of COVID-19, take steps to prevent the virus from spreading to others.  If you think you have a COVID-19 infection, contact your health care  provider right away. Tell your health care team that you think you may have a COVID-19 infection.  Stay home. Leave your house only to seek medical care. Do not use public transport.  Do not travel while you are sick.  Wash your hands often with soap and water for 20 seconds. If soap and water are not available, use alcohol-based hand sanitizer.  Stay away from other members of your household. Let healthy household members care for children and pets, if possible. If you have to care for children or pets, wash your hands often and wear a mask. If possible, stay in your own room, separate from others. Use a different bathroom.  Make sure that all people in your household wash their hands well and often.  Cough or sneeze into a tissue or your sleeve or elbow. Do not cough or sneeze into your hand or into the air.  Wear a cloth face covering or face mask. Make sure your mask covers your nose and mouth. Where to find more information  Centers for Disease Control and Prevention: www.cdc.gov/coronavirus/2019-ncov/index.html  World Health Organization: www.who.int/health-topics/coronavirus Contact a health care provider if:  You live in or have traveled to an area where COVID-19 is a risk and you have symptoms of the infection.  You have had contact with someone who has COVID-19 and you have symptoms of the infection. Get help right away if:  You have trouble breathing.  You have pain or pressure in your chest.  You have confusion.  You have bluish lips and fingernails.  You have difficulty waking from sleep.  You have symptoms that get worse. These symptoms may represent a serious problem that is an emergency. Do not wait to see if the symptoms will go away. Get medical help right away. Call your local emergency services (911 in the [place] Do not drive yourself to the hospital. Let the emergency medical personnel know if you think you have COVID-19. Summary  COVID-19 is a  respiratory infection that is caused by a virus. It is also known as coronavirus disease or novel coronavirus. It can cause serious infections, such as pneumonia, acute respiratory distress syndrome, acute respiratory failure, or sepsis.  The virus that causes COVID-19 is contagious. This means that it can spread from person to person through droplets from breathing, speaking, singing, coughing, or sneezing.  You are more likely to develop a serious illness if you are 50 years of age or older, have a weak immune system, live in a nursing home, or have chronic disease.  There is no medicine to treat COVID-19. Your health care provider will talk with you about ways to treat your symptoms.    Take steps to protect yourself and others from infection. Wash your hands often and disinfect objects and surfaces that are frequently touched every day. Stay away from people who are sick and wear a mask if you are sick. This information is not intended to replace advice given to you by your health care provider. Make sure you discuss any questions you have with your health care provider. Document Revised: 04/19/2019 Document Reviewed: 07/26/2018 Elsevier Patient Education  2020 [employer]  

## 2019-11-26 DIAGNOSIS — U071 COVID-19: Secondary | ICD-10-CM | POA: Diagnosis not present

## 2019-11-26 DIAGNOSIS — J9601 Acute respiratory failure with hypoxia: Secondary | ICD-10-CM | POA: Diagnosis not present

## 2019-11-26 DIAGNOSIS — I0981 Rheumatic heart failure: Secondary | ICD-10-CM | POA: Diagnosis not present

## 2019-11-26 DIAGNOSIS — I480 Paroxysmal atrial fibrillation: Secondary | ICD-10-CM | POA: Diagnosis not present

## 2019-11-26 DIAGNOSIS — I5031 Acute diastolic (congestive) heart failure: Secondary | ICD-10-CM | POA: Diagnosis not present

## 2019-11-26 DIAGNOSIS — I088 Other rheumatic multiple valve diseases: Secondary | ICD-10-CM | POA: Diagnosis not present

## 2019-11-26 DIAGNOSIS — E1151 Type 2 diabetes mellitus with diabetic peripheral angiopathy without gangrene: Secondary | ICD-10-CM | POA: Diagnosis not present

## 2019-11-26 DIAGNOSIS — J982 Interstitial emphysema: Secondary | ICD-10-CM | POA: Diagnosis not present

## 2019-11-26 DIAGNOSIS — J1282 Pneumonia due to coronavirus disease 2019: Secondary | ICD-10-CM | POA: Diagnosis not present

## 2019-11-26 MED ORDER — POTASSIUM CHLORIDE CRYS ER 10 MEQ PO TBCR: 10.0000 meq | EXTENDED_RELEASE_TABLET | Freq: Every day | ORAL | 0 refills | Status: DC

## 2019-11-26 NOTE — Addendum Note (Signed)
Addended by: Lurlean Nanny on: 11/26/2019 04:59 PM   Modules accepted: Orders

## 2019-11-26 NOTE — Progress Notes (Signed)
Cardiology Office Note   Date:  11/27/2019   ID:  Genita, Stubler 1945/09/17, MRN [id number]  PCP:  [name], NP  Cardiologist:  Sanda Klein, MD EP: None  No chief complaint on file.     History of Present Illness: Julia Mcfarland is a 74 y.o. female with PMH of paroxysmal atrial fibrillation, chronic venous insufficiency, who presents for post-hospital follow-up.  Patient was last evaluated by cardiology during a recent admission to the hospital from 10/28/19-11/14/19 after presenting with SOB and found to by in atrial fibrillation with RVR. She was subsequently diagnosed with COVID-19 PNA managed with IV antibiotics, remdesivir, and supportive care. Her hospital course was further complicated by pneumomediastinum and epistaxis. She was discharged home on amiodarone 200mg  daily for rate/rhythm control and eliquis for stroke ppx. Her blood pressures were soft limiting AV nodal blocking agents. Echo 10/29/19 showed EF 55-60%, no RWMA, indeterminate LV diastolic function, mild LAE, and no signiifiant valvular abnormalities.   She presents today for post-hospital follow-up. She reports good days and bad days in regards to her breathing. She has had worsening LE edema in recent days and her PCP increased her lasix to 20mg  BID which she started yesterday. She has been under increased stress as her husband has been hospitalized with COVID-19 PNA for the past 18 days and she reports he is not doing well. She has the support of two grown grandchildren at home. She has occasional dizziness when bending over but denies lightheadedness or syncope. No complaints of chest pain. She has occasional brief episodes of palpitations. O2 sats were 85% on initial vitals check, improved to 91% with a few minutes of rest. Patient was ambulated in the office and O2 sats dropped to 63% after a brief walk. O2 was applied via [place] at 2L and O2 sats improved to 95% at rest. Again ambulated with O2 via [place] at 2L  and O2 sats dropped to 87%. She had increased work of breathing with ambulation but no complaints of dizziness or lightheadedness, and no syncope.    Past Medical History:  Diagnosis Date  . Allergy   . Arthritis   . Chicken pox   . Chronic venous insufficiency    a. Uses furosemide prn.  . Degenerative joint disease   . Morbid obesity ([place])   . Osteoarthritis   . PONV (postoperative nausea and vomiting)     Past Surgical History:  Procedure Laterality Date  . ABDOMINAL HYSTERECTOMY  1988  . CARPAL TUNNEL RELEASE Right   . REPLACEMENT TOTAL KNEE Right 2010  . TOTAL KNEE ARTHROPLASTY Left 03/07/2017   Procedure: LEFT TOTAL KNEE ARTHROPLASTY;  Surgeon: Meredith Pel, MD;  Location: [facility];  Service: Orthopedics;  Laterality: Left;     Current Outpatient Medications  Medication Sig Dispense Refill  . albuterol (VENTOLIN HFA) 108 (90 Base) MCG/ACT inhaler Inhale 2 puffs into the lungs every 6 (six) hours as needed for wheezing or shortness of breath. 8 g 1  . amiodarone (PACERONE) 200 MG tablet Take 1 tablet (200 mg total) by mouth daily. 30 tablet 0  . apixaban (ELIQUIS) 5 MG TABS tablet Take 1 tablet (5 mg total) by mouth 2 (two) times daily. 60 tablet 0  . Cholecalciferol (VITAMIN D3) 5000 UNITS CAPS Take 5,000 Units by mouth daily.     . fexofenadine (ALLEGRA ALLERGY) 180 MG tablet Take 1 tablet (180 mg total) by mouth daily. 90 tablet 2  . furosemide (LASIX) 40  MG tablet Take 1 tablet (40 mg total) by mouth 2 (two) times daily. 60 tablet 3  . hydroxypropyl methylcellulose / hypromellose (ISOPTO TEARS / GONIOVISC) 2.5 % ophthalmic solution Place 1 drop into both eyes daily as needed for dry eyes.    . potassium chloride (KLOR-CON) 20 MEQ tablet Take 1 tablet (20 mEq total) by mouth 2 (two) times daily. 60 tablet 5  . vitamin B-12 (CYANOCOBALAMIN) 500 MCG tablet Take 500 mcg by mouth daily.     No current facility-administered medications for this visit.    Allergies:    Patient has no known allergies.    Social History:  The patient  reports that she has never smoked. She has never used smokeless tobacco. She reports current alcohol use. She reports that she does not use drugs.   Family History:  The patient's family history includes Arthritis in her mother; Diabetes in her mother and paternal grandmother; Heart disease in her maternal grandfather, mother, and paternal grandfather; Hypertension in her father, mother, and sister; Stroke in her father and sister.    ROS:  Please see the history of present illness.   Otherwise, review of systems are positive for none.   All other systems are reviewed and negative.    PHYSICAL EXAM: VS:  BP 138/76   Pulse 92   Ht 5\' 2"  (1.575 m)   Wt 277 lb (125.6 kg)   SpO2 (!) 85%   BMI 50.66 kg/m  , BMI Body mass index is 50.66 kg/m. GEN: Well nourished, well developed, in no acute distress HEENT: sclera anicteric Neck: no JVD, carotid bruits, or masses Cardiac: RRR; no murmurs, rubs, or gallops, 1+ LE edema  Respiratory: mildly increased WOB. No wheezes/rales/rhonchi on exam.  GI: soft, obese, nontender, nondistended, + BS MS: no deformity or atrophy Skin: warm and dry, no rash Neuro:  Strength and sensation are intact Psych: euthymic mood, full affect   EKG:  EKG is ordered today. The ekg ordered today demonstrates sinus rhythm, rate 89 bpm, no STE/D, no TWI.   Recent Labs: 10/28/2019: TSH 0.725 11/06/2019: B Natriuretic Peptide 45.5 11/07/2019: ALT 28; Magnesium 2.0 11/25/2019: BUN 9; Creatinine, Ser 0.89; Hemoglobin 12.0; Platelets 190.0; Potassium 3.1; Sodium 144    Lipid Panel    Component Value Date/Time   CHOL 133 10/29/2019 0450   TRIG 84 10/29/2019 0450   HDL 26 (L) 10/29/2019 0450   CHOLHDL 5.1 10/29/2019 0450   VLDL 17 10/29/2019 0450   LDLCALC 90 10/29/2019 0450   LDLDIRECT 113.6 08/14/2013 1557      Wt Readings from Last 3 Encounters:  11/27/19 277 lb (125.6 kg)  11/25/19 277 lb  (125.6 kg)  11/14/19 275 lb 2.2 oz (124.8 kg)      Other studies Reviewed: Additional studies/ records that were reviewed today include:  Echocardiogram 10/29/19: 1. Normal LV function; mild LAE.  2. Left ventricular ejection fraction, by estimation, is 55 to 60%. The  left ventricle has normal function. The left ventricle has no regional  wall motion abnormalities. Left ventricular diastolic function could not  be evaluated.  3. Right ventricular systolic function is normal. The right ventricular  size is normal. Tricuspid regurgitation signal is inadequate for assessing  PA pressure.  4. Left atrial size was mildly dilated.  5. The mitral valve is normal in structure. Trivial mitral valve  regurgitation. No evidence of mitral stenosis.  6. The aortic valve is tricuspid. Aortic valve regurgitation is not  visualized. No aortic stenosis  is present.  7. The inferior vena cava is normal in size with greater than 50%  respiratory variability, suggesting right atrial pressure of 3 mmHg.     ASSESSMENT AND PLAN:  1. Paroxysmal atrial fibrillation: occurred in the setting of COVID-19 PNA. Maintaining sinus rhythm on EKG today with sinus rhythm. No complaints of bleeding.  - Continue amiodarone for rate/rhythm control - Continue eliquis for stroke ppx  2. 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 O2 sats were 85% on initial vitals check, improved to 91% with a few minutes of rest. Patient was ambulated in the office and O2 sats dropped to 63% after a brief walk. O2 was applied via [place] at 2L and O2 sats improved to 95% at rest. Again ambulated with O2 via [place] at 2L and O2 sats dropped to 87%. She had increased work of breathing with ambulation but no complaints of dizziness or lightheadedness, and no syncope. Likely recent COVID-19 PNA is playing a role, though possible there is a diastolic CHF component contributing to her hypoxia  - Will initiate home O2  given resting and ambulatory hypoxia.  - Will check a BNP today - Will increase lasix to 40mg  BID with K 20 mEq BID until seen in follow-up next week.  - Will plan to repeat a BMET at her follow-up visit for close monitoring of kidney function and electrolytes  - She was able to arrange a post-hospital follow-up visit with pulmonology for 11/29/19.   2. Recent COVID-19 PNA: having good days and bad days. O2 sats low in office today as above - Will initiate home O2 - Encouraged follow-up with pulmonology - planned for 11/29/19  3. Chronic venous insufficiency: worsening LE edema in recent days. Instructed by PCP to increase lasix to 20mg  BID 11/27/19. Possible there is a chronic diastolic CHF component - Will increase lasix as above - Encouraged daily weights and a low sodium diet.     Current medicines are reviewed at length with the patient today.  The patient does not have concerns regarding medicines.  The following changes have been made:  As above  Labs/ tests ordered today include:   Orders Placed This Encounter  Procedures  . For home use only DME oxygen  . B Nat Peptide  . EKG 12-Lead     Disposition:   FU with me in 1 week  Signed, Abigail Butts, PA-C  11/27/2019 5:21 PM

## 2019-11-27 ENCOUNTER — Encounter: Payer: Self-pay | Admitting: Medical

## 2019-11-27 ENCOUNTER — Other Ambulatory Visit: Payer: Self-pay | Admitting: Internal Medicine

## 2019-11-27 ENCOUNTER — Ambulatory Visit: Payer: Medicare HMO | Admitting: Medical

## 2019-11-27 ENCOUNTER — Other Ambulatory Visit: Payer: Self-pay

## 2019-11-27 VITALS — BP 138/76 | HR 92 | Ht 62.0 in | Wt 277.0 lb

## 2019-11-27 DIAGNOSIS — Z8616 Personal history of COVID-19: Secondary | ICD-10-CM | POA: Diagnosis not present

## 2019-11-27 DIAGNOSIS — I5031 Acute diastolic (congestive) heart failure: Secondary | ICD-10-CM | POA: Diagnosis not present

## 2019-11-27 DIAGNOSIS — R7981 Abnormal blood-gas level: Secondary | ICD-10-CM

## 2019-11-27 DIAGNOSIS — I872 Venous insufficiency (chronic) (peripheral): Secondary | ICD-10-CM | POA: Diagnosis not present

## 2019-11-27 DIAGNOSIS — I480 Paroxysmal atrial fibrillation: Secondary | ICD-10-CM | POA: Diagnosis not present

## 2019-11-27 DIAGNOSIS — E876 Hypokalemia: Secondary | ICD-10-CM | POA: Diagnosis not present

## 2019-11-27 DIAGNOSIS — J8410 Pulmonary fibrosis, unspecified: Secondary | ICD-10-CM

## 2019-11-27 DIAGNOSIS — U071 COVID-19: Secondary | ICD-10-CM

## 2019-11-27 MED ORDER — POTASSIUM CHLORIDE CRYS ER 20 MEQ PO TBCR: 20.0000 meq | EXTENDED_RELEASE_TABLET | Freq: Two times a day (BID) | ORAL | 5 refills | Status: DC

## 2019-11-27 MED ORDER — FUROSEMIDE 40 MG PO TABS: 40.0000 mg | ORAL_TABLET | Freq: Two times a day (BID) | ORAL | 3 refills | Status: DC

## 2019-11-27 NOTE — Patient Instructions (Addendum)
Medication Instructions:  INCREASE YOUR LASIX (FUROSEMIDE) TO 40 MG TWICE A DAY   START POTASSIUM 20 MEQ TWICE A DAY   *If you need a refill on your cardiac medications before your next appointment, please call your pharmacy*  Lab Work: BNP TODAY   Testing/Procedures: NONE  Follow-Up: 12/04/2019 AT 3:15   Other Instructions  WILL ARRANGE HOME OXYGEN, IF YOU DO NOT HEAR FROM THEM BY TOMORROW CALL THE OFFICE AT [phone]  MONITOR YOUR BLOOD PRESSURE AND WEIGHT DAILY, BRING TO YOUR FOLLOW UP   LOW SODIUM DIET   Low-Sodium Eating Plan Sodium, which is an element that makes up salt, helps you maintain a healthy balance of fluids in your body. Too much sodium can increase your blood pressure and cause fluid and waste to be held in your body. Your health care Klyde Banka or dietitian may recommend following this plan if you have high blood pressure (hypertension), kidney disease, liver disease, or heart failure. Eating less sodium can help lower your blood pressure, reduce swelling, and protect your heart, liver, and kidneys. What are tips for following this plan? General guidelines  Most people on this plan should limit their sodium intake to 1,500-2,000 mg (milligrams) of sodium each day. Reading food labels   The Nutrition Facts label lists the amount of sodium in one serving of the food. If you eat more than one serving, you must multiply the listed amount of sodium by the number of servings.  Choose foods with less than 140 mg of sodium per serving.  Avoid foods with 300 mg of sodium or more per serving. Shopping  Look for lower-sodium products, often labeled as "low-sodium" or "no salt added."  Always check the sodium content even if foods are labeled as "unsalted" or "no salt added".  Buy fresh foods. ? Avoid canned foods and premade or frozen meals. ? Avoid canned, cured, or processed meats  Buy breads that have less than 80 mg of sodium per slice. Cooking  Eat more  home-cooked food and less restaurant, buffet, and fast food.  Avoid adding salt when cooking. Use salt-free seasonings or herbs instead of table salt or sea salt. Check with your health care Rona Tomson or pharmacist before using salt substitutes.  Cook with plant-based oils, such as canola, sunflower, or olive oil. Meal planning  When eating at a restaurant, ask that your food be prepared with less salt or no salt, if possible.  Avoid foods that contain MSG (monosodium glutamate). MSG is sometimes added to [place] food, bouillon, and some canned foods. What foods are recommended? The items listed may not be a complete list. Talk with your dietitian about what dietary choices are best for you. Grains Low-sodium cereals, including oats, puffed wheat and rice, and shredded wheat. Low-sodium crackers. Unsalted rice. Unsalted pasta. Low-sodium bread. Whole-grain breads and whole-grain pasta. Vegetables Fresh or frozen vegetables. "No salt added" canned vegetables. "No salt added" tomato sauce and paste. Low-sodium or reduced-sodium tomato and vegetable juice. Fruits Fresh, frozen, or canned fruit. Fruit juice. Meats and other protein foods Fresh or frozen (no salt added) meat, poultry, seafood, and fish. Low-sodium canned tuna and salmon. Unsalted nuts. Dried peas, beans, and lentils without added salt. Unsalted canned beans. Eggs. Unsalted nut butters. Dairy Milk. Soy milk. Cheese that is naturally low in sodium, such as ricotta cheese, fresh mozzarella, or Swiss cheese Low-sodium or reduced-sodium cheese. Cream cheese. Yogurt. Fats and oils Unsalted butter. Unsalted margarine with no trans fat. Vegetable oils such as canola  or olive oils. Seasonings and other foods Fresh and dried herbs and spices. Salt-free seasonings. Low-sodium mustard and ketchup. Sodium-free salad dressing. Sodium-free light mayonnaise. Fresh or refrigerated horseradish. Lemon juice. Vinegar. Homemade, reduced-sodium, or  low-sodium soups. Unsalted popcorn and pretzels. Low-salt or salt-free chips. What foods are not recommended? The items listed may not be a complete list. Talk with your dietitian about what dietary choices are best for you. Grains Instant hot cereals. Bread stuffing, pancake, and biscuit mixes. Croutons. Seasoned rice or pasta mixes. Noodle soup cups. Boxed or frozen macaroni and cheese. Regular salted crackers. Self-rising flour. Vegetables Sauerkraut, pickled vegetables, and relishes. Olives. [place] fries. Onion rings. Regular canned vegetables (not low-sodium or reduced-sodium). Regular canned tomato sauce and paste (not low-sodium or reduced-sodium). Regular tomato and vegetable juice (not low-sodium or reduced-sodium). Frozen vegetables in sauces. Meats and other protein foods Meat or fish that is salted, canned, smoked, spiced, or pickled. Bacon, ham, sausage, hotdogs, corned beef, chipped beef, packaged lunch meats, salt pork, jerky, pickled herring, anchovies, regular canned tuna, sardines, salted nuts. Dairy Processed cheese and cheese spreads. Cheese curds. Blue cheese. Feta cheese. String cheese. Regular cottage cheese. Buttermilk. Canned milk. Fats and oils Salted butter. Regular margarine. Ghee. Bacon fat. Seasonings and other foods Onion salt, garlic salt, seasoned salt, table salt, and sea salt. Canned and packaged gravies. Worcestershire sauce. Tartar sauce. Barbecue sauce. Teriyaki sauce. Soy sauce, including reduced-sodium. Steak sauce. Fish sauce. Oyster sauce. Cocktail sauce. Horseradish that you find on the shelf. Regular ketchup and mustard. Meat flavorings and tenderizers. Bouillon cubes. Hot sauce and Tabasco sauce. Premade or packaged marinades. Premade or packaged taco seasonings. Relishes. Regular salad dressings. Salsa. Potato and tortilla chips. Corn chips and puffs. Salted popcorn and pretzels. Canned or dried soups. Pizza. Frozen entrees and pot pies. Summary  Eating  less sodium can help lower your blood pressure, reduce swelling, and protect your heart, liver, and kidneys.  Most people on this plan should limit their sodium intake to 1,500-2,000 mg (milligrams) of sodium each day.  Canned, boxed, and frozen foods are high in sodium. Restaurant foods, fast foods, and pizza are also very high in sodium. You also get sodium by adding salt to food.  Try to cook at home, eat more fresh fruits and vegetables, and eat less fast food, canned, processed, or prepared foods. This information is not intended to replace advice given to you by your health care Kaidyn Javid. Make sure you discuss any questions you have with your health care Rudi Bunyard. Document Revised: 06/02/2017 Document Reviewed: 06/13/2016 Elsevier Patient Education  2020 [employer].

## 2019-11-28 LAB — BRAIN NATRIURETIC PEPTIDE: BNP: 43.8 pg/mL (ref 0.0–100.0)

## 2019-11-29 ENCOUNTER — Encounter: Payer: Self-pay | Admitting: Internal Medicine

## 2019-11-29 ENCOUNTER — Ambulatory Visit: Payer: Medicare HMO | Admitting: Internal Medicine

## 2019-11-29 ENCOUNTER — Telehealth: Payer: Self-pay | Admitting: Medical

## 2019-11-29 ENCOUNTER — Other Ambulatory Visit: Payer: Self-pay

## 2019-11-29 VITALS — BP 136/84 | HR 99 | Temp 98.0°F | Ht 62.0 in | Wt 277.0 lb

## 2019-11-29 DIAGNOSIS — J449 Chronic obstructive pulmonary disease, unspecified: Secondary | ICD-10-CM | POA: Diagnosis not present

## 2019-11-29 DIAGNOSIS — I5031 Acute diastolic (congestive) heart failure: Secondary | ICD-10-CM | POA: Diagnosis not present

## 2019-11-29 DIAGNOSIS — Z8616 Personal history of COVID-19: Secondary | ICD-10-CM | POA: Diagnosis not present

## 2019-11-29 DIAGNOSIS — U071 COVID-19: Secondary | ICD-10-CM | POA: Diagnosis not present

## 2019-11-29 DIAGNOSIS — I088 Other rheumatic multiple valve diseases: Secondary | ICD-10-CM | POA: Diagnosis not present

## 2019-11-29 DIAGNOSIS — J982 Interstitial emphysema: Secondary | ICD-10-CM | POA: Diagnosis not present

## 2019-11-29 DIAGNOSIS — J9601 Acute respiratory failure with hypoxia: Secondary | ICD-10-CM | POA: Diagnosis not present

## 2019-11-29 DIAGNOSIS — J1282 Pneumonia due to coronavirus disease 2019: Secondary | ICD-10-CM | POA: Diagnosis not present

## 2019-11-29 DIAGNOSIS — I0981 Rheumatic heart failure: Secondary | ICD-10-CM | POA: Diagnosis not present

## 2019-11-29 DIAGNOSIS — I480 Paroxysmal atrial fibrillation: Secondary | ICD-10-CM | POA: Diagnosis not present

## 2019-11-29 DIAGNOSIS — E1151 Type 2 diabetes mellitus with diabetic peripheral angiopathy without gangrene: Secondary | ICD-10-CM | POA: Diagnosis not present

## 2019-11-29 NOTE — Telephone Encounter (Signed)
Spoke to physical therapist with [facility]- he is with the patient at present time.  He states patient is wanting to know how much oxygen should she be on once oxygen arrives at her home.   Per Order - 2 liter by nasal cannula--   Oxygen tank should arrive today per company it is on delivery.  . Voiced understnding

## 2019-11-29 NOTE — Patient Instructions (Signed)
ICD-10-CM   1. Acute respiratory failure with hypoxia (HCC)  J96.01   2. History of 2019 novel coronavirus disease (COVID-19)  Z86.16      Admit to [facility] via ER I spoke to ICU 3 address at [facility] and they will address your husband's funeral process I recommend that you strongly get admitted otherwise your life can be jeopardized and be threatened  Follow-up -2 to 4 weeks with nurse practitioner in [place] [facility]

## 2019-11-29 NOTE — Telephone Encounter (Signed)
Spoke with [employer] again and they should be delivering the oxygen today, advised patient

## 2019-11-29 NOTE — Telephone Encounter (Signed)
Spoke with Dewitt Hoes at [facility], order received and she will follow up on Oxygen. Spoke with patient and advised She is currently at pulmonologist now

## 2019-11-29 NOTE — Telephone Encounter (Signed)
New message   Per the office visit note on 11/27/19 it states that the patient needs oxygen at her home. The patient is calling to see when she will receive this oxygen at her home. Please advise.

## 2019-11-29 NOTE — Progress Notes (Signed)
OV 2019/12/29  Subjective:  Patient ID: Julia Mcfarland, female , DOB: [birth year] , age 74 y.o. , MRN: [id number] , ADDRESS: [place] [id number]   2019-12-29 -   Chief Complaint  Patient presents with  . Pulmonary Consult    pulmonary fibrosis     HPI [name] 74 y.o. -  Is the wife of [name] A. Ascheman Female, 74 y.o., [birth year]MRN: [id number]. [name] died of covid 19 Dec 29, 2019 at bed 22m13 [place] today AM. Jason Coop -> presented to [facility] for new consult. Post covid.      She herself was admitted at [facility] on October 28, 2019 on the hospitalist service and discharged on Nov 14, 2019 after more than 2 weeks in the hospital.  She tells me that post discharge home physical therapy has been working with her and she is beginning to feel subjectively better.  However she does notice that her pedal edema got significantly worse while at home.  Lasix was prescribed but the oral Lasix has not worked well.  She is also noticing that when physical therapy works with that she has gotten progressively hypoxemic this past 1 week.  Today when she walked in and we sat her at rest on room air she was 84%.  Review of the records indicate it was similar when she visited cardiology clinic on Nov 25, 2019.  Oxygen was prescribed but this has not been started yet. The last known chest x-ray that I can personally visualized is on February 28, 2017.  This looks clear to me.  She then had Covid on October 28, 2019.  This chest x-ray started showing infiltrates.  The most recent chest x-ray is Nov 25, 2019 that shows severe bilateral infiltrates and ARDS pattern.  This is actually worse than the admission chest x-ray   So today she presents to the pulmonary clinic for new evaluation.  She is 84% on room air at rest.  We also found out that her husband just died this morning from COVID-19.  I called Dr. Vaughan Browner and spoke to him and also Almyra Free and the  charge nurse on [place] [facility] at [facility].  They confirm the patients husband is deceased.  They been trying to get hold of the wife my patient of today.   Echo October 29, 2019 had normal ejection fraction.  Diastolic dysfunction is not known. ROS - per HPI     has a past medical history of Allergy, Arthritis, Chicken pox, Chronic venous insufficiency, Degenerative joint disease, Morbid obesity ([place]), Osteoarthritis, and PONV (postoperative nausea and vomiting).   reports that she has never smoked. She has never used smokeless tobacco.  Past Surgical History:  Procedure Laterality Date  . ABDOMINAL HYSTERECTOMY  1988  . CARPAL TUNNEL RELEASE Right   . REPLACEMENT TOTAL KNEE Right 2010  . TOTAL KNEE ARTHROPLASTY Left 03/07/2017   Procedure: LEFT TOTAL KNEE ARTHROPLASTY;  Surgeon: Meredith Pel, MD;  Location: [facility];  Service: Orthopedics;  Laterality: Left;    No Known Allergies  Immunization History  Administered Date(s) Administered  . Influenza,inj,Quad PF,6+ Mos 07/27/2018  . Pneumococcal Conjugate-13 08/21/2014  . Pneumococcal Polysaccharide-23 12/23/2015  . Tdap 08/14/2013    Family History  Problem Relation Age of Onset  . Arthritis Mother   . Heart disease Mother   . Hypertension Mother   . Diabetes Mother   . Stroke Father   .  Hypertension Father   . Stroke Sister   . Hypertension Sister   . Heart disease Maternal Grandfather   . Diabetes Paternal Grandmother   . Heart disease Paternal Grandfather      Current Outpatient Medications:  .  albuterol (VENTOLIN HFA) 108 (90 Base) MCG/ACT inhaler, Inhale 2 puffs into the lungs every 6 (six) hours as needed for wheezing or shortness of breath., Disp: 8 g, Rfl: 1 .  amiodarone (PACERONE) 200 MG tablet, Take 1 tablet (200 mg total) by mouth daily., Disp: 30 tablet, Rfl: 0 .  apixaban (ELIQUIS) 5 MG TABS tablet, Take 1 tablet (5 mg total) by mouth 2 (two) times daily., Disp: 60 tablet, Rfl: 0 .   Cholecalciferol (VITAMIN D3) 5000 UNITS CAPS, Take 5,000 Units by mouth daily. , Disp: , Rfl:  .  fexofenadine (ALLEGRA ALLERGY) 180 MG tablet, Take 1 tablet (180 mg total) by mouth daily., Disp: 90 tablet, Rfl: 2 .  furosemide (LASIX) 40 MG tablet, Take 1 tablet (40 mg total) by mouth 2 (two) times daily., Disp: 60 tablet, Rfl: 3 .  hydroxypropyl methylcellulose / hypromellose (ISOPTO TEARS / GONIOVISC) 2.5 % ophthalmic solution, Place 1 drop into both eyes daily as needed for dry eyes., Disp: , Rfl:  .  potassium chloride (KLOR-CON) 20 MEQ tablet, Take 1 tablet (20 mEq total) by mouth 2 (two) times daily., Disp: 60 tablet, Rfl: 5 .  vitamin B-12 (CYANOCOBALAMIN) 500 MCG tablet, Take 500 mcg by mouth daily., Disp: , Rfl:       Objective:   Vitals:   11/29/19 0937 11/29/19 0941  BP: 136/84   Pulse: 99   Temp: 98 F (36.7 C)   TempSrc: Temporal   SpO2: (!) 84% 91%  Weight: 277 lb (125.6 kg)   Height: 5\' 2"  (1.575 m)    84% at rest as soon as she sat in the room. Estimated body mass index is 50.66 kg/m as calculated from the following:   Height as of this encounter: 5\' 2"  (1.575 m).   Weight as of this encounter: 277 lb (125.6 kg).  @WEIGHTCHANGE @  [employer]   11/29/19 0937  Weight: 277 lb (125.6 kg)     Physical Exam Morbidly obese female sitting in a wheelchair.  Pulse ox is 84% on room air at rest.  Fine crackles present.  4+ edema.  She is upset that her husband passed away and appropriately.        Assessment:       ICD-10-CM   1. Acute respiratory failure with hypoxia (HCC)  J96.01   2. History of 2019 novel coronavirus disease (COVID-19)  Z86.16    She has worsening chest x-ray post Covid.  She is more hypoxemic post Covid.  She is worsening pedal edema post Covid.  With obesity this all fits in with diastolic dysfunction acute diastolic heart failure.  The other alternative etiologies acute lung injury post Covid.  She is very hypoxemic.  She absolutely needs  admission.  She initially agreed but then she started adamantly refusing.  This is because she wants to go home and get her husband's affairs in order.  She says she will just wait for home oxygen to be delivered and she can manage.  The medical assistant went and spoke to her but she still adamantly saying she will go only home.  She does not want to be taken to the emergency department for admission.  I have categorically told her that she absolutely needs admission  but she is not listening.  She has complete capacity of her decisions.  We have explained to her that she will die and there will be another death in the family which is her death.  I called the medical service at [facility].  I have asked for the social work to call her and reassure her that the husband's funeral process will be handle such that her health can be taken care of safely without jeopardizing the husband's funeral process.  We waiting this call at this moment at 10:12 AM  Subsequent to this despite [place] telling other the social worker would call her.  Patient was adamant that she would go home.  She does not have oxygen.  We could not give our oxygen tank because her oxygen tank supplied by [employer].  Patient had rejected [employer] and gone with another company.  She does not know which company.  All she knows is that that particular company called her this morning when she was on her way to see [place] and she got off that call.  She believes her oxygen will be delivered today.  I explained to her the oxygen is a treatment but not a solution for her problem which I believe can be life-threatening given her diffuse pulmonary infiltrates and severe edema.  The CMA also explained this.  She was just adamant and wanted to leave.  She refused to go to the ER.  Therefore she left without oxygen    Plan:     Patient Instructions     ICD-10-CM   1. Acute respiratory failure with hypoxia (HCC)  J96.01   2. History of 2019 novel  coronavirus disease (COVID-19)  Z86.16      Admit to [facility] via ER I spoke to ICU 3 address at [facility] and they will address your husband's funeral process I recommend that you strongly get admitted otherwise your life can be jeopardized and be threatened  Follow-up -2 to 4 weeks with nurse practitioner in [facility] clinic   ( Level 05 visit:  New 60-74 min   in  visit type: on-site physical face to visit  in total care time and counseling or/and coordination of care by this undersigned MD - Dr Brand Males. This includes one or more of the following on this same day 11/29/2019: pre-charting, chart review, note writing, documentation discussion of test results, diagnostic or treatment recommendations, prognosis, risks and benefits of management options, instructions, education, compliance or risk-factor reduction. It excludes time spent by the [place] or office staff in the care of the patient. Actual time 73 min)   SIGNATURE    Dr. Brand Males, M.D., F.C.C.P,  Pulmonary and Critical Care Medicine Staff Physician, [place] Director - Interstitial Lung Disease  Program  P[facility] at [place], [place], [address]  Pager: [phone], If no answer or between  15:00h - 7:00h: call [phone] Telephone: [phone]  10:10 AM 11/29/2019

## 2019-12-01 NOTE — Progress Notes (Signed)
Cardiology Office Note   Date:  12/04/2019   ID:  Julia Mcfarland 22-Mar-1946, MRN [id number]  PCP:  [name], NP  Cardiologist:  Sanda Klein, MD EP: None  Chief Complaint  Patient presents with  . Follow-up    hypoxia      History of Present Illness: Julia Mcfarland is a 74 y.o. female with PMH of chronic diastolic CHF, paroxysmal atrial fibrillation, chronic venous insufficiency, and recent COVID-19 PNA, who presents for close outpatient follow-up of her hypoxia.  She was last evaluated by cardiology at an outpatient visit with myself 11/27/19, at which time she was hypoxic to the mid 80s at rest, and profoundly hypoxic to the 60s with ambulation. Prior to this she was admitted to the hospital  from 10/28/19-11/14/19 after presenting with SOB and found to by in atrial fibrillation with RVR. She was subsequently diagnosed with COVID-19 PNA managed with IV antibiotics, remdesivir, and supportive care. Her hospital course was further complicated by pneumomediastinum and epistaxis. She was discharged home on amiodarone 200mg  daily for rate/rhythm control and eliquis for stroke ppx. Her blood pressures were soft limiting AV nodal blocking agents. Echo 10/29/19 showed EF 55-60%, no RWMA, indeterminate LV diastolic function, mild LAE, and no signiifiant valvular abnormalities.   Home O2 was ordered at her visit with myself 11/27/19 and she was recommended to increase lasix to 40mg  BID until seen in follow-up in 1 week for possible diastolic CHF component.   She was seen by pulmonology 11/29/19 and had continued hypoxia which was felt to be related to acute diastolic CHF with possible post-COVID lung injury contributing. She was recommended for admission at that time, though refused. Unfortunately at the time of her pulmonary visit she was informed that her husband passed away from COVID-19 that morning and the hospital had been trying to reach her but was unsuccessful.   She presents  today for close follow-up. Condolences given in light of her husbands recent passing. They were married for 40 years and had two children. Planning for a funeral service on Sunday. She has good family support at home and seems to be coping well, all things considered. She feels her breathing has improved significantly with her home O2. Weight is down 3lbs from last week. She feels like her legs are less heavy and swelling has improved. No complaints of chest pain, palpitations, dizziness, lightheadedness, or syncope.   Past Medical History:  Diagnosis Date  . Allergy   . Arthritis   . Chicken pox   . Chronic venous insufficiency    a. Uses furosemide prn.  . Degenerative joint disease   . Morbid obesity ([place])   . Osteoarthritis   . PONV (postoperative nausea and vomiting)     Past Surgical History:  Procedure Laterality Date  . ABDOMINAL HYSTERECTOMY  1988  . CARPAL TUNNEL RELEASE Right   . REPLACEMENT TOTAL KNEE Right 2010  . TOTAL KNEE ARTHROPLASTY Left 03/07/2017   Procedure: LEFT TOTAL KNEE ARTHROPLASTY;  Surgeon: Meredith Pel, MD;  Location: [facility];  Service: Orthopedics;  Laterality: Left;     Current Outpatient Medications  Medication Sig Dispense Refill  . albuterol (VENTOLIN HFA) 108 (90 Base) MCG/ACT inhaler Inhale 2 puffs into the lungs every 6 (six) hours as needed for wheezing or shortness of breath. 8 g 1  . amiodarone (PACERONE) 200 MG tablet Take 1 tablet (200 mg total) by mouth daily. 30 tablet 0  . apixaban (ELIQUIS) 5 MG  TABS tablet Take 1 tablet (5 mg total) by mouth 2 (two) times daily. 60 tablet 0  . Cholecalciferol (VITAMIN D3) 5000 UNITS CAPS Take 5,000 Units by mouth daily.     . fexofenadine (ALLEGRA ALLERGY) 180 MG tablet Take 1 tablet (180 mg total) by mouth daily. 90 tablet 2  . furosemide (LASIX) 40 MG tablet Take 1 tablet (40 mg total) by mouth 2 (two) times daily. 60 tablet 3  . hydroxypropyl methylcellulose / hypromellose (ISOPTO TEARS /  GONIOVISC) 2.5 % ophthalmic solution Place 1 drop into both eyes daily as needed for dry eyes.    . potassium chloride (KLOR-CON) 20 MEQ tablet Take 1 tablet (20 mEq total) by mouth 2 (two) times daily. 60 tablet 5  . vitamin B-12 (CYANOCOBALAMIN) 500 MCG tablet Take 500 mcg by mouth daily.     No current facility-administered medications for this visit.    Allergies:   Patient has no known allergies.    Social History:  The patient  reports that she has never smoked. She has never used smokeless tobacco. She reports current alcohol use. She reports that she does not use drugs.   Family History:  The patient's family history includes Arthritis in her mother; Diabetes in her mother and paternal grandmother; Heart disease in her maternal grandfather, mother, and paternal grandfather; Hypertension in her father, mother, and sister; Stroke in her father and sister.    ROS:  Please see the history of present illness.   Otherwise, review of systems are positive for none.   All other systems are reviewed and negative.    PHYSICAL EXAM: VS:  BP 104/60   Pulse 97   Ht 5\' 2"  (1.575 m)   Wt 273 lb (123.8 kg)   SpO2 96%   BMI 49.93 kg/m  , BMI Body mass index is 49.93 kg/m. GEN: Well nourished, well developed, in no acute distress HEENT: sclera anicteric Neck: no JVD, carotid bruits, or masses Cardiac: RRR; no murmurs, rubs, or gallops, 1-2+ LE edema  Respiratory:  clear to auscultation bilaterally, conversationally SOB GI: soft, nontender, nondistended, + BS MS: no deformity or atrophy Skin: warm and dry, no rash Neuro:  Strength and sensation are intact Psych: euthymic mood, full affect   EKG:  EKG is not ordered today.   Recent Labs: 10/28/2019: TSH 0.725 11/07/2019: ALT 28; Magnesium 2.0 11/25/2019: BUN 9; Creatinine, Ser 0.89; Hemoglobin 12.0; Platelets 190.0; Potassium 3.1; Sodium 144 11/27/2019: BNP 43.8    Lipid Panel    Component Value Date/Time   CHOL 133 10/29/2019 0450     TRIG 84 10/29/2019 0450   HDL 26 (L) 10/29/2019 0450   CHOLHDL 5.1 10/29/2019 0450   VLDL 17 10/29/2019 0450   LDLCALC 90 10/29/2019 0450   LDLDIRECT 113.6 08/14/2013 1557      Wt Readings from Last 3 Encounters:  12/04/19 273 lb (123.8 kg)  11/29/19 277 lb (125.6 kg)  11/27/19 277 lb (125.6 kg)      Other studies Reviewed: Additional studies/ records that were reviewed today include:   Echocardiogram 10/29/19: 1. Normal LV function; mild LAE.  2. Left ventricular ejection fraction, by estimation, is 55 to 60%. The  left ventricle has normal function. The left ventricle has no regional  wall motion abnormalities. Left ventricular diastolic function could not  be evaluated.  3. Right ventricular systolic function is normal. The right ventricular  size is normal. Tricuspid regurgitation signal is inadequate for assessing  PA pressure.  4. Left  atrial size was mildly dilated.  5. The mitral valve is normal in structure. Trivial mitral valve  regurgitation. No evidence of mitral stenosis.  6. The aortic valve is tricuspid. Aortic valve regurgitation is not  visualized. No aortic stenosis is present.  7. The inferior vena cava is normal in size with greater than 50%  respiratory variability, suggesting right atrial pressure of 3 mmHg.      ASSESSMENT AND PLAN:   1. Acute diastolic CHF: O2 sats improved to 96% today. Weight is down 3lbs. Still conversationally SOB and has 1-2+ LE edema on exam - Will continue lasix 40mg  BID for now with potassium supplement - Will check a BMET today for close monitoring of her kidney function and electrolytes.   2. Paroxysmal atrial fibrillation: occurred in the setting of COVID-19 PNA. Maintaining sinus rhythm on EKG today with sinus rhythm. No complaints of bleeding.  - Continue amiodarone for rate/rhythm control - Continue eliquis for stroke ppx  3. Recent COVID-19 PNA: breathing has improved with O2 via [place].  - Encouraged  follow-up with pulmonology for ongoing management of her post-COVID lung injury.    Current medicines are reviewed at length with the patient today.  The patient does not have concerns regarding medicines.  The following changes have been made:  As above  Labs/ tests ordered today include:   Orders Placed This Encounter  Procedures  . Basic metabolic panel     Disposition:   FU with Dr. Sallyanne Kuster in 2 months  Signed, Abigail Butts, PA-C  12/04/2019 4:43 PM

## 2019-12-04 ENCOUNTER — Other Ambulatory Visit: Payer: Self-pay

## 2019-12-04 ENCOUNTER — Ambulatory Visit: Payer: Medicare HMO | Admitting: Medical

## 2019-12-04 ENCOUNTER — Encounter: Payer: Self-pay | Admitting: Medical

## 2019-12-04 VITALS — BP 104/60 | HR 97 | Ht 62.0 in | Wt 273.0 lb

## 2019-12-04 DIAGNOSIS — R7981 Abnormal blood-gas level: Secondary | ICD-10-CM | POA: Diagnosis not present

## 2019-12-04 DIAGNOSIS — I480 Paroxysmal atrial fibrillation: Secondary | ICD-10-CM

## 2019-12-04 DIAGNOSIS — I5031 Acute diastolic (congestive) heart failure: Secondary | ICD-10-CM | POA: Diagnosis not present

## 2019-12-04 DIAGNOSIS — Z8616 Personal history of COVID-19: Secondary | ICD-10-CM | POA: Diagnosis not present

## 2019-12-04 NOTE — Patient Instructions (Addendum)
Medication Instructions:  Your physician recommends that you continue on your current medications as directed. Please refer to the Current Medication list given to you today.  *If you need a refill on your cardiac medications before your next appointment, please call your pharmacy*  Lab Work: Your physician recommends that you return for lab work TODAY:   BMET  If you have labs (blood work) drawn today and your tests are completely normal, you will receive your results only by: Marland Kitchen MyChart Message (if you have MyChart) OR . A paper copy in the mail If you have any lab test that is abnormal or we need to change your treatment, we will call you to review the results.  Testing/Procedures: NONE ordered at this time of appointment   Follow-Up: At [facility], you and your health needs are our priority.  As part of our continuing mission to provide you with exceptional heart care, we have created designated Provider Care Teams.  These Care Teams include your primary Cardiologist (physician) and Advanced Practice Providers (APPs -  Physician Assistants and Nurse Practitioners) who all work together to provide you with the care you need, when you need it.  We recommend signing up for the patient portal called "MyChart".  Sign up information is provided on this After Visit Summary.  MyChart is used to connect with patients for Virtual Visits (Telemedicine).  Patients are able to view lab/test results, encounter notes, upcoming appointments, etc.  Non-urgent messages can be sent to your provider as well.   To learn more about what you can do with MyChart, go to [email or web address].    Your next appointment:   2 month(s)  The format for your next appointment:   In Person  Provider:   Sanda Klein, MD  Other Instructions  PLEASE schedule an appointment with your Pulmonologist within the next month  PLEASE read the information given at today's appointment on [employer] [facility]  [employer]      Low-Sodium Eating Plan Sodium, which is an element that makes up salt, helps you maintain a healthy balance of fluids in your body. Too much sodium can increase your blood pressure and cause fluid and waste to be held in your body. Your health care provider or dietitian may recommend following this plan if you have high blood pressure (hypertension), kidney disease, liver disease, or heart failure. Eating less sodium can help lower your blood pressure, reduce swelling, and protect your heart, liver, and kidneys. What are tips for following this plan? General guidelines  Most people on this plan should limit their sodium intake to 1,500-2,000 mg (milligrams) of sodium each day. Reading food labels   The Nutrition Facts label lists the amount of sodium in one serving of the food. If you eat more than one serving, you must multiply the listed amount of sodium by the number of servings.  Choose foods with less than 140 mg of sodium per serving.  Avoid foods with 300 mg of sodium or more per serving. Shopping  Look for lower-sodium products, often labeled as "low-sodium" or "no salt added."  Always check the sodium content even if foods are labeled as "unsalted" or "no salt added".  Buy fresh foods. ? Avoid canned foods and premade or frozen meals. ? Avoid canned, cured, or processed meats  Buy breads that have less than 80 mg of sodium per slice. Cooking  Eat more home-cooked food and less restaurant, buffet, and fast food.  Avoid adding salt when cooking. Use salt-free seasonings  or herbs instead of table salt or sea salt. Check with your health care provider or pharmacist before using salt substitutes.  Cook with plant-based oils, such as canola, sunflower, or olive oil. Meal planning  When eating at a restaurant, ask that your food be prepared with less salt or no salt, if possible.  Avoid foods that contain MSG (monosodium glutamate). MSG is sometimes added to  [place] food, bouillon, and some canned foods. What foods are recommended? The items listed may not be a complete list. Talk with your dietitian about what dietary choices are best for you. Grains Low-sodium cereals, including oats, puffed wheat and rice, and shredded wheat. Low-sodium crackers. Unsalted rice. Unsalted pasta. Low-sodium bread. Whole-grain breads and whole-grain pasta. Vegetables Fresh or frozen vegetables. "No salt added" canned vegetables. "No salt added" tomato sauce and paste. Low-sodium or reduced-sodium tomato and vegetable juice. Fruits Fresh, frozen, or canned fruit. Fruit juice. Meats and other protein foods Fresh or frozen (no salt added) meat, poultry, seafood, and fish. Low-sodium canned tuna and salmon. Unsalted nuts. Dried peas, beans, and lentils without added salt. Unsalted canned beans. Eggs. Unsalted nut butters. Dairy Milk. Soy milk. Cheese that is naturally low in sodium, such as ricotta cheese, fresh mozzarella, or Swiss cheese Low-sodium or reduced-sodium cheese. Cream cheese. Yogurt. Fats and oils Unsalted butter. Unsalted margarine with no trans fat. Vegetable oils such as canola or olive oils. Seasonings and other foods Fresh and dried herbs and spices. Salt-free seasonings. Low-sodium mustard and ketchup. Sodium-free salad dressing. Sodium-free light mayonnaise. Fresh or refrigerated horseradish. Lemon juice. Vinegar. Homemade, reduced-sodium, or low-sodium soups. Unsalted popcorn and pretzels. Low-salt or salt-free chips. What foods are not recommended? The items listed may not be a complete list. Talk with your dietitian about what dietary choices are best for you. Grains Instant hot cereals. Bread stuffing, pancake, and biscuit mixes. Croutons. Seasoned rice or pasta mixes. Noodle soup cups. Boxed or frozen macaroni and cheese. Regular salted crackers. Self-rising flour. Vegetables Sauerkraut, pickled vegetables, and relishes. Olives. [place] fries.  Onion rings. Regular canned vegetables (not low-sodium or reduced-sodium). Regular canned tomato sauce and paste (not low-sodium or reduced-sodium). Regular tomato and vegetable juice (not low-sodium or reduced-sodium). Frozen vegetables in sauces. Meats and other protein foods Meat or fish that is salted, canned, smoked, spiced, or pickled. Bacon, ham, sausage, hotdogs, corned beef, chipped beef, packaged lunch meats, salt pork, jerky, pickled herring, anchovies, regular canned tuna, sardines, salted nuts. Dairy Processed cheese and cheese spreads. Cheese curds. Blue cheese. Feta cheese. String cheese. Regular cottage cheese. Buttermilk. Canned milk. Fats and oils Salted butter. Regular margarine. Ghee. Bacon fat. Seasonings and other foods Onion salt, garlic salt, seasoned salt, table salt, and sea salt. Canned and packaged gravies. Worcestershire sauce. Tartar sauce. Barbecue sauce. Teriyaki sauce. Soy sauce, including reduced-sodium. Steak sauce. Fish sauce. Oyster sauce. Cocktail sauce. Horseradish that you find on the shelf. Regular ketchup and mustard. Meat flavorings and tenderizers. Bouillon cubes. Hot sauce and Tabasco sauce. Premade or packaged marinades. Premade or packaged taco seasonings. Relishes. Regular salad dressings. Salsa. Potato and tortilla chips. Corn chips and puffs. Salted popcorn and pretzels. Canned or dried soups. Pizza. Frozen entrees and pot pies. Summary  Eating less sodium can help lower your blood pressure, reduce swelling, and protect your heart, liver, and kidneys.  Most people on this plan should limit their sodium intake to 1,500-2,000 mg (milligrams) of sodium each day.  Canned, boxed, and frozen foods are high in sodium. Restaurant foods, fast  foods, and pizza are also very high in sodium. You also get sodium by adding salt to food.  Try to cook at home, eat more fresh fruits and vegetables, and eat less fast food, canned, processed, or prepared foods. This  information is not intended to replace advice given to you by your health care provider. Make sure you discuss any questions you have with your health care provider. Document Revised: 06/02/2017 Document Reviewed: 06/13/2016 Elsevier Patient Education  2020 [employer].

## 2019-12-05 ENCOUNTER — Telehealth: Payer: Self-pay | Admitting: Internal Medicine

## 2019-12-05 LAB — BASIC METABOLIC PANEL
BUN/Creatinine Ratio: 10 — ABNORMAL LOW (ref 12–28)
BUN: 10 mg/dL (ref 8–27)
CO2: 29 mmol/L (ref 20–29)
Calcium: 9.1 mg/dL (ref 8.7–10.3)
Chloride: 99 mmol/L (ref 96–106)
Creatinine, Ser: 0.98 mg/dL (ref 0.57–1.00)
GFR calc Af Amer: 67 mL/min/{1.73_m2} (ref >59)
GFR calc non Af Amer: 58 mL/min/{1.73_m2} — ABNORMAL LOW (ref >59)
Glucose: 210 mg/dL — ABNORMAL HIGH (ref 65–99)
Potassium: 3.8 mmol/L (ref 3.5–5.2)
Sodium: 146 mmol/L — ABNORMAL HIGH (ref 134–144)

## 2019-12-05 NOTE — Telephone Encounter (Signed)
Thanks a lot Johnson Siding for going above and beyond and checking on the patient.  Please do ensure a follow-up in the next few weeks at pulmonary clinic in [place] with either myself or nurse practitioner

## 2019-12-05 NOTE — Telephone Encounter (Signed)
I called and spoke with the pt on 12/04/19 to check and see how she was feeling since her appt with Dr Chase Caller on 11/29/19. She reported to me that she is feeling much improved. Her o2 was delivered to her that day and she has been using this 24/7. She feels that this has really helped her breathing. She was seen by cards 12/04/19- reports lost a lot of fluid on diuretic and feels this has helped as well. She states plans to f/u with our office and will call back to schedule an appt at a later time. I have notified Dr Chase Caller of this encounter via secure chat.

## 2019-12-06 DIAGNOSIS — J982 Interstitial emphysema: Secondary | ICD-10-CM | POA: Diagnosis not present

## 2019-12-06 DIAGNOSIS — U071 COVID-19: Secondary | ICD-10-CM | POA: Diagnosis not present

## 2019-12-06 DIAGNOSIS — J9601 Acute respiratory failure with hypoxia: Secondary | ICD-10-CM | POA: Diagnosis not present

## 2019-12-06 DIAGNOSIS — I5031 Acute diastolic (congestive) heart failure: Secondary | ICD-10-CM | POA: Diagnosis not present

## 2019-12-06 DIAGNOSIS — I088 Other rheumatic multiple valve diseases: Secondary | ICD-10-CM | POA: Diagnosis not present

## 2019-12-06 DIAGNOSIS — J1282 Pneumonia due to coronavirus disease 2019: Secondary | ICD-10-CM | POA: Diagnosis not present

## 2019-12-06 DIAGNOSIS — I480 Paroxysmal atrial fibrillation: Secondary | ICD-10-CM | POA: Diagnosis not present

## 2019-12-06 DIAGNOSIS — E1151 Type 2 diabetes mellitus with diabetic peripheral angiopathy without gangrene: Secondary | ICD-10-CM | POA: Diagnosis not present

## 2019-12-06 DIAGNOSIS — I0981 Rheumatic heart failure: Secondary | ICD-10-CM | POA: Diagnosis not present

## 2019-12-06 NOTE — Progress Notes (Signed)
Updated medication list per Roby Lofts, PA-C medication changes

## 2019-12-10 NOTE — Telephone Encounter (Signed)
LMTCB x 1 

## 2019-12-11 ENCOUNTER — Other Ambulatory Visit: Payer: Self-pay

## 2019-12-11 ENCOUNTER — Other Ambulatory Visit (INDEPENDENT_AMBULATORY_CARE_PROVIDER_SITE_OTHER): Payer: Medicare HMO

## 2019-12-11 DIAGNOSIS — E876 Hypokalemia: Secondary | ICD-10-CM

## 2019-12-11 LAB — POTASSIUM: Potassium: 3.8 mEq/L (ref 3.5–5.1)

## 2019-12-13 DIAGNOSIS — U071 COVID-19: Secondary | ICD-10-CM | POA: Diagnosis not present

## 2019-12-13 DIAGNOSIS — I5031 Acute diastolic (congestive) heart failure: Secondary | ICD-10-CM | POA: Diagnosis not present

## 2019-12-13 DIAGNOSIS — J1282 Pneumonia due to coronavirus disease 2019: Secondary | ICD-10-CM | POA: Diagnosis not present

## 2019-12-13 DIAGNOSIS — J9601 Acute respiratory failure with hypoxia: Secondary | ICD-10-CM | POA: Diagnosis not present

## 2019-12-13 DIAGNOSIS — J982 Interstitial emphysema: Secondary | ICD-10-CM | POA: Diagnosis not present

## 2019-12-13 DIAGNOSIS — E1151 Type 2 diabetes mellitus with diabetic peripheral angiopathy without gangrene: Secondary | ICD-10-CM | POA: Diagnosis not present

## 2019-12-13 DIAGNOSIS — I480 Paroxysmal atrial fibrillation: Secondary | ICD-10-CM | POA: Diagnosis not present

## 2019-12-13 DIAGNOSIS — I0981 Rheumatic heart failure: Secondary | ICD-10-CM | POA: Diagnosis not present

## 2019-12-13 DIAGNOSIS — I088 Other rheumatic multiple valve diseases: Secondary | ICD-10-CM | POA: Diagnosis not present

## 2019-12-16 DIAGNOSIS — J9601 Acute respiratory failure with hypoxia: Secondary | ICD-10-CM | POA: Diagnosis not present

## 2019-12-16 DIAGNOSIS — U071 COVID-19: Secondary | ICD-10-CM | POA: Diagnosis not present

## 2019-12-16 DIAGNOSIS — E1151 Type 2 diabetes mellitus with diabetic peripheral angiopathy without gangrene: Secondary | ICD-10-CM | POA: Diagnosis not present

## 2019-12-16 DIAGNOSIS — I0981 Rheumatic heart failure: Secondary | ICD-10-CM | POA: Diagnosis not present

## 2019-12-16 DIAGNOSIS — I480 Paroxysmal atrial fibrillation: Secondary | ICD-10-CM | POA: Diagnosis not present

## 2019-12-16 DIAGNOSIS — I5031 Acute diastolic (congestive) heart failure: Secondary | ICD-10-CM | POA: Diagnosis not present

## 2019-12-16 DIAGNOSIS — I088 Other rheumatic multiple valve diseases: Secondary | ICD-10-CM | POA: Diagnosis not present

## 2019-12-16 DIAGNOSIS — J982 Interstitial emphysema: Secondary | ICD-10-CM | POA: Diagnosis not present

## 2019-12-16 DIAGNOSIS — J1282 Pneumonia due to coronavirus disease 2019: Secondary | ICD-10-CM | POA: Diagnosis not present

## 2019-12-18 NOTE — Telephone Encounter (Signed)
appt scheduled with MR in [place] for 01/24/20 at 2:30- I put in 30 min slot since he did not get a lot of time with her last ov

## 2019-12-19 ENCOUNTER — Other Ambulatory Visit: Payer: Self-pay | Admitting: Internal Medicine

## 2019-12-19 DIAGNOSIS — Z6841 Body Mass Index (BMI) 40.0 and over, adult: Secondary | ICD-10-CM

## 2019-12-19 DIAGNOSIS — I0981 Rheumatic heart failure: Secondary | ICD-10-CM | POA: Diagnosis not present

## 2019-12-19 DIAGNOSIS — J982 Interstitial emphysema: Secondary | ICD-10-CM | POA: Diagnosis not present

## 2019-12-19 DIAGNOSIS — J302 Other seasonal allergic rhinitis: Secondary | ICD-10-CM

## 2019-12-19 DIAGNOSIS — M47814 Spondylosis without myelopathy or radiculopathy, thoracic region: Secondary | ICD-10-CM

## 2019-12-19 DIAGNOSIS — Z9181 History of falling: Secondary | ICD-10-CM

## 2019-12-19 DIAGNOSIS — I700 Atherosclerosis of aorta: Secondary | ICD-10-CM

## 2019-12-19 DIAGNOSIS — J1282 Pneumonia due to coronavirus disease 2019: Secondary | ICD-10-CM | POA: Diagnosis not present

## 2019-12-19 DIAGNOSIS — S5011XD Contusion of right forearm, subsequent encounter: Secondary | ICD-10-CM

## 2019-12-19 DIAGNOSIS — I872 Venous insufficiency (chronic) (peripheral): Secondary | ICD-10-CM

## 2019-12-19 DIAGNOSIS — Z7901 Long term (current) use of anticoagulants: Secondary | ICD-10-CM

## 2019-12-19 DIAGNOSIS — E1151 Type 2 diabetes mellitus with diabetic peripheral angiopathy without gangrene: Secondary | ICD-10-CM | POA: Diagnosis not present

## 2019-12-19 DIAGNOSIS — J9601 Acute respiratory failure with hypoxia: Secondary | ICD-10-CM | POA: Diagnosis not present

## 2019-12-19 DIAGNOSIS — S40021D Contusion of right upper arm, subsequent encounter: Secondary | ICD-10-CM

## 2019-12-19 DIAGNOSIS — Z96653 Presence of artificial knee joint, bilateral: Secondary | ICD-10-CM

## 2019-12-19 DIAGNOSIS — E876 Hypokalemia: Secondary | ICD-10-CM

## 2019-12-19 DIAGNOSIS — U071 COVID-19: Secondary | ICD-10-CM | POA: Diagnosis not present

## 2019-12-19 DIAGNOSIS — S5012XD Contusion of left forearm, subsequent encounter: Secondary | ICD-10-CM

## 2019-12-19 DIAGNOSIS — N179 Acute kidney failure, unspecified: Secondary | ICD-10-CM

## 2019-12-19 DIAGNOSIS — S40022D Contusion of left upper arm, subsequent encounter: Secondary | ICD-10-CM

## 2019-12-19 DIAGNOSIS — I480 Paroxysmal atrial fibrillation: Secondary | ICD-10-CM | POA: Diagnosis not present

## 2019-12-19 DIAGNOSIS — I5031 Acute diastolic (congestive) heart failure: Secondary | ICD-10-CM | POA: Diagnosis not present

## 2019-12-19 DIAGNOSIS — I088 Other rheumatic multiple valve diseases: Secondary | ICD-10-CM

## 2019-12-19 NOTE — Telephone Encounter (Signed)
Is pt supposed to continue Rx? Please advise

## 2019-12-20 NOTE — Telephone Encounter (Signed)
Yes, per cards

## 2019-12-27 ENCOUNTER — Telehealth: Payer: Self-pay | Admitting: Cardiovascular Disease

## 2019-12-27 DIAGNOSIS — U071 COVID-19: Secondary | ICD-10-CM | POA: Diagnosis not present

## 2019-12-27 DIAGNOSIS — E1151 Type 2 diabetes mellitus with diabetic peripheral angiopathy without gangrene: Secondary | ICD-10-CM | POA: Diagnosis not present

## 2019-12-27 DIAGNOSIS — I0981 Rheumatic heart failure: Secondary | ICD-10-CM | POA: Diagnosis not present

## 2019-12-27 DIAGNOSIS — J9601 Acute respiratory failure with hypoxia: Secondary | ICD-10-CM | POA: Diagnosis not present

## 2019-12-27 DIAGNOSIS — I480 Paroxysmal atrial fibrillation: Secondary | ICD-10-CM | POA: Diagnosis not present

## 2019-12-27 DIAGNOSIS — J1282 Pneumonia due to coronavirus disease 2019: Secondary | ICD-10-CM | POA: Diagnosis not present

## 2019-12-27 DIAGNOSIS — I5031 Acute diastolic (congestive) heart failure: Secondary | ICD-10-CM | POA: Diagnosis not present

## 2019-12-27 DIAGNOSIS — J982 Interstitial emphysema: Secondary | ICD-10-CM | POA: Diagnosis not present

## 2019-12-27 DIAGNOSIS — I088 Other rheumatic multiple valve diseases: Secondary | ICD-10-CM | POA: Diagnosis not present

## 2019-12-27 NOTE — Telephone Encounter (Signed)
Patient states that her oxygen level is up to 96% and she no longer needs her oxygen tank. She wants to know what she should do with it so she doesn't have to pay for next month.

## 2019-12-27 NOTE — Telephone Encounter (Signed)
Spoke to patient she stated she thinks she does not need O2 any longer.Stated O2 sat 96%.She only uses when she thinks she needs it.She wanted to ask Dr.Croitoru if he will discontinue.Advised I will send message to him for advice.

## 2019-12-30 DIAGNOSIS — U071 COVID-19: Secondary | ICD-10-CM | POA: Diagnosis not present

## 2019-12-30 DIAGNOSIS — J449 Chronic obstructive pulmonary disease, unspecified: Secondary | ICD-10-CM | POA: Diagnosis not present

## 2019-12-30 NOTE — Telephone Encounter (Signed)
Call placed to [facility] at [phone]. They will need a discontinuation order faxed to [phone].  The patient has been called and made aware that this will be taken care of when the provider is back in the office to do a discontinuation letter.

## 2019-12-30 NOTE — Telephone Encounter (Signed)
Understood. We cannot force her to use it. Cancel O2 please.

## 2019-12-30 NOTE — Telephone Encounter (Signed)
Thanks, Daleen Snook. That is a very good idea.

## 2019-12-30 NOTE — Telephone Encounter (Signed)
Spoke with the patient. She was adamant that her oxygen was fine now. She stated that she checks it first thing in the morning and when she ambulates and it is always in the mid to high 90's. She does not feel an appointment is necessary and stated that she cannot afford to keep the oxygen. She wants it sent back.   She has been educated on the problems that can occur if her O2 runs too low and she does not have the oxygen supplement at home anymore. She was still adamant that she no longer wishes to use it.

## 2020-01-01 NOTE — Telephone Encounter (Signed)
Discontinuation fax has been sent.

## 2020-01-17 ENCOUNTER — Emergency Department (HOSPITAL_COMMUNITY)
Admission: EM | Admit: 2020-01-17 | Discharge: 2020-01-17 | Disposition: A | Discharge status: Home / Self Care | Payer: Medicare HMO | Attending: Emergency Medicine | Admitting: Emergency Medicine

## 2020-01-17 ENCOUNTER — Other Ambulatory Visit: Payer: Self-pay

## 2020-01-17 ENCOUNTER — Ambulatory Visit (HOSPITAL_COMMUNITY)
Admission: EM | Admit: 2020-01-17 | Discharge: 2020-01-17 | Disposition: A | Discharge status: Other Acute Inpatient Hospital | Payer: Medicare HMO | Source: Home / Self Care

## 2020-01-17 ENCOUNTER — Encounter: Payer: Self-pay | Admitting: Cardiovascular Disease

## 2020-01-17 ENCOUNTER — Emergency Department (HOSPITAL_COMMUNITY): Payer: Medicare HMO

## 2020-01-17 DIAGNOSIS — R42 Dizziness and giddiness: Secondary | ICD-10-CM | POA: Diagnosis not present

## 2020-01-17 DIAGNOSIS — S0101XA Laceration without foreign body of scalp, initial encounter: Secondary | ICD-10-CM

## 2020-01-17 DIAGNOSIS — S0993XA Unspecified injury of face, initial encounter: Secondary | ICD-10-CM | POA: Diagnosis not present

## 2020-01-17 DIAGNOSIS — S199XXA Unspecified injury of neck, initial encounter: Secondary | ICD-10-CM | POA: Diagnosis not present

## 2020-01-17 DIAGNOSIS — Y929 Unspecified place or not applicable: Secondary | ICD-10-CM | POA: Diagnosis not present

## 2020-01-17 DIAGNOSIS — Z7901 Long term (current) use of anticoagulants: Secondary | ICD-10-CM | POA: Insufficient documentation

## 2020-01-17 DIAGNOSIS — W19XXXA Unspecified fall, initial encounter: Secondary | ICD-10-CM

## 2020-01-17 DIAGNOSIS — Y9389 Activity, other specified: Secondary | ICD-10-CM | POA: Insufficient documentation

## 2020-01-17 DIAGNOSIS — Y999 Unspecified external cause status: Secondary | ICD-10-CM | POA: Insufficient documentation

## 2020-01-17 DIAGNOSIS — Z79899 Other long term (current) drug therapy: Secondary | ICD-10-CM | POA: Insufficient documentation

## 2020-01-17 DIAGNOSIS — W101XXA Fall (on)(from) sidewalk curb, initial encounter: Secondary | ICD-10-CM | POA: Insufficient documentation

## 2020-01-17 DIAGNOSIS — S0990XA Unspecified injury of head, initial encounter: Secondary | ICD-10-CM | POA: Diagnosis not present

## 2020-01-17 DIAGNOSIS — S01511A Laceration without foreign body of lip, initial encounter: Secondary | ICD-10-CM | POA: Diagnosis not present

## 2020-01-17 MED ORDER — AMOXICILLIN-POT CLAVULANATE 875-125 MG PO TABS
1.0000 | ORAL_TABLET | Freq: Two times a day (BID) | ORAL | 0 refills | Status: DC
Start: 2020-01-17 — End: 2020-01-27

## 2020-01-17 MED ORDER — LIDOCAINE-EPINEPHRINE-TETRACAINE (LET) TOPICAL GEL
3.0000 mL | Freq: Once | TOPICAL | Status: AC
  Administered 2020-01-17: 3 mL via TOPICAL
  Filled 2020-01-17: qty 3

## 2020-01-17 MED ORDER — LIDOCAINE-EPINEPHRINE-TETRACAINE (LET) TOPICAL GEL: 3.0000 mL | Freq: Once | TOPICAL | Status: DC

## 2020-01-17 NOTE — ED Notes (Signed)
Patient verbalizes understanding of discharge instructions. Opportunity for questioning and answers were provided. Armband removed by staff, pt discharged from ED.  

## 2020-01-17 NOTE — Progress Notes (Signed)
Pt has lac to L head at the hairline.  Bleeding controlled.  Blood noted to lips and abrasion to L foot.

## 2020-01-17 NOTE — Progress Notes (Signed)
TRN irrigated and cleaned head lac.  LET gel applied to gauze and placed on lac.  Stapler at bedside.

## 2020-01-17 NOTE — Discharge Instructions (Addendum)
Please return to the ED in 10 days time for staple removal. Your PCP can also remove these if they have a staple remover in their office.  I have prescribed antibiotics to cover for your lip lacerations given you bit down on your lip and these can get infected. I would recommend softer foods for the next couple of days while this closes on its own. You can also perform salt water gargles.   It is very important that you contact your cardiologist when their office reopens to discuss if they wanted you on the Eliquis long term.   Return to the ED IMMEDIATELY for any worsening symptoms including signs of infection to your scalp or lip - these include: Redness/swelling around the wound, drainage of pus, fevers > 100.4, chills.

## 2020-01-17 NOTE — ED Notes (Signed)
Pt with fall and head injury; pt recently stopped taking eliquis and is having some confusion after fall; per NB pt to be eval in ED; family verbalized understanding and pt ambulated out of department

## 2020-01-17 NOTE — ED Notes (Signed)
Pt's gold earrings and golden watch placed in cup with patient label sticker and in a patient's belongings bag.

## 2020-01-17 NOTE — ED Triage Notes (Signed)
Pt arrives to ED after a fall. Pt takes eliquis and hit head. Pt c/o dizziness. Level 2 trauma activated.

## 2020-01-17 NOTE — ED Provider Notes (Signed)
[facility] Provider Note   CSN: [id number] Arrival date & time: 01/17/20  1531     History Chief Complaint  Patient presents with  . Trauma    Julia Mcfarland is a 74 y.o. female with PMHx pAF on Eliquis who presents to the ED today with complaint of head trauma s/2 mechanical fall that occurred approximately 30 minutes PTA.  Patient reports she was stepping out of the truck when she tripped over the curb causing her to hit her frontal head along the hairline as well as her mouth.  No loss of consciousness.  Patient states that she felt fine prior to tripping and denies any prodrome however states that she now feels dizzy.  She also states her upper teeth feel "funny."  She is having some mild neck pain as well.  She has no other complaints at this time.   The history is provided by the patient and medical records.       Past Medical History:  Diagnosis Date  . Allergy   . Arthritis   . Chicken pox   . Chronic venous insufficiency    a. Uses furosemide prn.  . Degenerative joint disease   . Morbid obesity ([place])   . Osteoarthritis   . PONV (postoperative nausea and vomiting)     Patient Active Problem List   Diagnosis Date Noted  . Persistent atrial fibrillation ([place]) 10/28/2019  . Chronic venous insufficiency 02/18/2019  . Prediabetes 08/21/2014  . Osteoporosis 08/14/2013    Past Surgical History:  Procedure Laterality Date  . ABDOMINAL HYSTERECTOMY  1988  . CARPAL TUNNEL RELEASE Right   . REPLACEMENT TOTAL KNEE Right 2010  . TOTAL KNEE ARTHROPLASTY Left 03/07/2017   Procedure: LEFT TOTAL KNEE ARTHROPLASTY;  Surgeon: Meredith Pel, MD;  Location: [facility];  Service: Orthopedics;  Laterality: Left;     OB History   No obstetric history on file.     Family History  Problem Relation Age of Onset  . Arthritis Mother   . Heart disease Mother   . Hypertension Mother   . Diabetes Mother   . Stroke Father   . Hypertension  Father   . Stroke Sister   . Hypertension Sister   . Heart disease Maternal Grandfather   . Diabetes Paternal Grandmother   . Heart disease Paternal Grandfather     Social History   Tobacco Use  . Smoking status: Never Smoker  . Smokeless tobacco: Never Used  Vaping Use  . Vaping Use: Never used  Substance Use Topics  . Alcohol use: Yes    Comment: occasional  . Drug use: No    Home Medications Prior to Admission medications   Medication Sig Start Date End Date Taking? Authorizing Provider  albuterol (VENTOLIN HFA) 108 (90 Base) MCG/ACT inhaler Inhale 2 puffs into the lungs every 6 (six) hours as needed for wheezing or shortness of breath. 09/19/19  Yes Baity, Coralie Keens, NP  apixaban (ELIQUIS) 5 MG TABS tablet Take 1 tablet (5 mg total) by mouth 2 (two) times daily. 11/14/19  Yes Ghimire, Henreitta Leber, MD  Ascorbic Acid (VITAMIN C) 1000 MG tablet Take 1,000 mg by mouth daily.   Yes [provider]  Cholecalciferol (VITAMIN D3) 5000 UNITS CAPS Take 5,000 Units by mouth daily.    Yes [provider]  furosemide (LASIX) 40 MG tablet Take 60 mg by mouth daily.    Yes [provider]  hydroxypropyl methylcellulose / hypromellose (  ISOPTO TEARS / GONIOVISC) 2.5 % ophthalmic solution Place 1 drop into both eyes daily as needed for dry eyes.   Yes [provider]  KLOR-CON M10 10 MEQ tablet TAKE 1 TABLET BY MOUTH EVERY DAY Patient taking differently: Take 10 mEq by mouth.  12/20/19  Yes Baity, Coralie Keens, NP  vitamin B-12 (CYANOCOBALAMIN) 500 MCG tablet Take 500 mcg by mouth daily.   Yes [provider]  amiodarone (PACERONE) 200 MG tablet Take 1 tablet (200 mg total) by mouth daily. Patient not taking: Reported on 01/17/2020 11/15/19   Jonetta Osgood, MD  amoxicillin-clavulanate (AUGMENTIN) 875-125 MG tablet Take 1 tablet by mouth every 12 (twelve) hours. 01/17/20   Eustaquio Maize, PA-C  fexofenadine (ALLEGRA ALLERGY) 180 MG tablet Take 1 tablet (180  mg total) by mouth daily. Patient not taking: Reported on 01/17/2020 09/19/19   [name], NP    Allergies    Patient has no known allergies.  Review of Systems   Review of Systems  Eyes: Negative for visual disturbance.  Skin: Positive for wound.  Neurological: Positive for headaches. Negative for syncope.  All other systems reviewed and are negative.   Physical Exam Updated Vital Signs BP (!) 148/92   Pulse 90   Temp 98.7 F (37.1 C) (Oral)   Resp 16   SpO2 95%   Physical Exam Vitals and nursing note reviewed.  Constitutional:      Appearance: She is not ill-appearing.  HENT:     Head: Normocephalic.     Comments: Dried blood noted to the frontal hairline; difficult to assess wound at this time; bleeding controlled; mild TTP No raccoon's sign or battle's sign. Negative hemotympanum bilaterally.     Right Ear: Tympanic membrane normal.     Left Ear: Tympanic membrane normal.     Mouth/Throat:     Comments: 2 lacerations noted to the inner bottom lip; bleeding controlled; does not involve the vermillion border; is not through and through to outer aspect Eyes:     Extraocular Movements: Extraocular movements intact.     Conjunctiva/sclera: Conjunctivae normal.     Pupils: Pupils are equal, round, and reactive to light.  Neck:     Comments: + Midline C spine TTP; C collar applied Cardiovascular:     Rate and Rhythm: Normal rate and regular rhythm.     Pulses: Normal pulses.  Pulmonary:     Effort: Pulmonary effort is normal.     Breath sounds: Normal breath sounds. No wheezing, rhonchi or rales.  Abdominal:     Palpations: Abdomen is soft.     Tenderness: There is no abdominal tenderness. There is no guarding or rebound.  Musculoskeletal:     Cervical back: Neck supple. Tenderness present.     Comments: No T or L midline spinal TTP. No tenderness to all joints of BUE and BLEs. Moving all extremities without difficulty. Strength and sensation intact. 2+ distal  pulses.   Skin:    General: Skin is warm and dry.  Neurological:     Mental Status: She is alert.     Comments: CN 3-12 grossly intact A&O x4 GCS 15 Sensation and strength intact Gait nonataxic including with tandem walking Coordination with finger-to-nose WNL Neg romberg, neg pronator drift     ED Results / Procedures / Treatments   Labs (all labs ordered are listed, but only abnormal results are displayed) Labs Reviewed - No data to display  EKG EKG Interpretation  Date/Time:  Friday  January 17 2020 16:07:13 EDT Ventricular Rate:  82 PR Interval:    QRS Duration: 90 QT Interval:  391 QTC Calculation: 457 R Axis:   32 Text Interpretation: Sinus rhythm Biatrial enlargement Borderline ST elevation, anterolateral leads Baseline wander in lead(s) V3 No significant change since last tracing Confirmed by Blanchie Dessert [id number]) on 01/17/2020 4:09:53 PM   Radiology CT Head Wo Contrast  Result Date: 01/17/2020 CLINICAL DATA:  [place], anticoagulated, hit head, dizziness EXAM: CT HEAD WITHOUT CONTRAST CT MAXILLOFACIAL WITHOUT CONTRAST CT CERVICAL SPINE WITHOUT CONTRAST TECHNIQUE: Multidetector CT imaging of the head, cervical spine, and maxillofacial structures were performed using the standard protocol without intravenous contrast. Multiplanar CT image reconstructions of the cervical spine and maxillofacial structures were also generated. COMPARISON:  None. FINDINGS: CT HEAD FINDINGS Brain: No acute infarct or hemorrhage. Lateral ventricles and midline structures are unremarkable. No acute extra-axial fluid collections. No mass effect. Vascular: No hyperdense vessel or unexpected calcification. Skull: Normal. Negative for fracture or focal lesion. Other: None. CT MAXILLOFACIAL FINDINGS Osseous: No fracture or mandibular dislocation. No destructive process. Orbits: Negative. No traumatic or inflammatory finding. Sinuses: Clear. Soft tissues: Negative. CT CERVICAL SPINE FINDINGS Alignment:  Alignment is anatomic. Skull base and vertebrae: No acute displaced fractures. Soft tissues and spinal canal: No prevertebral fluid or swelling. No visible canal hematoma. Disc levels: There is diffuse cervical spondylosis and facet hypertrophy. Spondylosis is most pronounced at C3-4, C5-6, and C6-7. Facet hypertrophy is most pronounced at C4-5. There is symmetrical neural foraminal encroachment at C3-4, C5-6, and C6-7. Upper chest: Airway is patent. Scattered ground-glass opacities are seen at the lung apices, which could reflect edema. Other: Reconstructed images demonstrate no additional findings. IMPRESSION: 1. No acute intracranial process. 2. No acute facial bone fracture. 3. No acute cervical spine fracture. Diffuse cervical spondylosis. 4. Scattered ground-glass opacities at the lung apices, which could reflect edema. Electronically Signed   By: Randa Ngo M.D.   On: 01/17/2020 17:07   CT Cervical Spine Wo Contrast  Result Date: 01/17/2020 CLINICAL DATA:  [place], anticoagulated, hit head, dizziness EXAM: CT HEAD WITHOUT CONTRAST CT MAXILLOFACIAL WITHOUT CONTRAST CT CERVICAL SPINE WITHOUT CONTRAST TECHNIQUE: Multidetector CT imaging of the head, cervical spine, and maxillofacial structures were performed using the standard protocol without intravenous contrast. Multiplanar CT image reconstructions of the cervical spine and maxillofacial structures were also generated. COMPARISON:  None. FINDINGS: CT HEAD FINDINGS Brain: No acute infarct or hemorrhage. Lateral ventricles and midline structures are unremarkable. No acute extra-axial fluid collections. No mass effect. Vascular: No hyperdense vessel or unexpected calcification. Skull: Normal. Negative for fracture or focal lesion. Other: None. CT MAXILLOFACIAL FINDINGS Osseous: No fracture or mandibular dislocation. No destructive process. Orbits: Negative. No traumatic or inflammatory finding. Sinuses: Clear. Soft tissues: Negative. CT CERVICAL SPINE  FINDINGS Alignment: Alignment is anatomic. Skull base and vertebrae: No acute displaced fractures. Soft tissues and spinal canal: No prevertebral fluid or swelling. No visible canal hematoma. Disc levels: There is diffuse cervical spondylosis and facet hypertrophy. Spondylosis is most pronounced at C3-4, C5-6, and C6-7. Facet hypertrophy is most pronounced at C4-5. There is symmetrical neural foraminal encroachment at C3-4, C5-6, and C6-7. Upper chest: Airway is patent. Scattered ground-glass opacities are seen at the lung apices, which could reflect edema. Other: Reconstructed images demonstrate no additional findings. IMPRESSION: 1. No acute intracranial process. 2. No acute facial bone fracture. 3. No acute cervical spine fracture. Diffuse cervical spondylosis. 4. Scattered ground-glass opacities at the lung apices, which could reflect edema.  Electronically Signed   By: Randa Ngo M.D.   On: 01/17/2020 17:07   CT Maxillofacial WO CM  Result Date: 01/17/2020 CLINICAL DATA:  [place], anticoagulated, hit head, dizziness EXAM: CT HEAD WITHOUT CONTRAST CT MAXILLOFACIAL WITHOUT CONTRAST CT CERVICAL SPINE WITHOUT CONTRAST TECHNIQUE: Multidetector CT imaging of the head, cervical spine, and maxillofacial structures were performed using the standard protocol without intravenous contrast. Multiplanar CT image reconstructions of the cervical spine and maxillofacial structures were also generated. COMPARISON:  None. FINDINGS: CT HEAD FINDINGS Brain: No acute infarct or hemorrhage. Lateral ventricles and midline structures are unremarkable. No acute extra-axial fluid collections. No mass effect. Vascular: No hyperdense vessel or unexpected calcification. Skull: Normal. Negative for fracture or focal lesion. Other: None. CT MAXILLOFACIAL FINDINGS Osseous: No fracture or mandibular dislocation. No destructive process. Orbits: Negative. No traumatic or inflammatory finding. Sinuses: Clear. Soft tissues: Negative. CT  CERVICAL SPINE FINDINGS Alignment: Alignment is anatomic. Skull base and vertebrae: No acute displaced fractures. Soft tissues and spinal canal: No prevertebral fluid or swelling. No visible canal hematoma. Disc levels: There is diffuse cervical spondylosis and facet hypertrophy. Spondylosis is most pronounced at C3-4, C5-6, and C6-7. Facet hypertrophy is most pronounced at C4-5. There is symmetrical neural foraminal encroachment at C3-4, C5-6, and C6-7. Upper chest: Airway is patent. Scattered ground-glass opacities are seen at the lung apices, which could reflect edema. Other: Reconstructed images demonstrate no additional findings. IMPRESSION: 1. No acute intracranial process. 2. No acute facial bone fracture. 3. No acute cervical spine fracture. Diffuse cervical spondylosis. 4. Scattered ground-glass opacities at the lung apices, which could reflect edema. Electronically Signed   By: Randa Ngo M.D.   On: 01/17/2020 17:07    Procedures .Marland KitchenLaceration Repair  Date/Time: 01/17/2020 6:15 PM Performed by: Eustaquio Maize, PA-C Authorized by: Eustaquio Maize, PA-C   Consent:    Consent obtained:  Verbal   Consent given by:  Patient   Risks discussed:  Pain, infection and poor cosmetic result Anesthesia (see MAR for exact dosages):    Anesthesia method:  Topical application   Topical anesthetic:  LET Laceration details:    Location:  Scalp   Scalp location:  Frontal   Length (cm):  3   Depth (mm):  2 Repair type:    Repair type:  Intermediate Exploration:    Hemostasis achieved with:  LET Skin repair:    Repair method:  Staples   Number of staples:  4 Approximation:    Approximation:  Close Post-procedure details:    Dressing:  Open (no dressing)   Patient tolerance of procedure:  Tolerated well, no immediate complications   (including critical care time)  Medications Ordered in ED Medications  lidocaine-EPINEPHrine-tetracaine (LET) topical gel (has no administration in time  range)  lidocaine-EPINEPHrine-tetracaine (LET) topical gel (3 mLs Topical Given 01/17/20 1728)    ED Course  I have reviewed the triage vital signs and the nursing notes.  Pertinent labs & imaging results that were available during my care of the patient were reviewed by me and considered in my medical decision making (see chart for details).    MDM Rules/Calculators/A&P                          74 year old female presents to the ED today after mechanical fall where she struck her head, no loss of consciousness however patient is on Eliquis for A. fib.  Complains of a "funny feeling" in her teeth as well as some  dizziness.  Level 2 trauma initiated given age and on thinners.  Will immediately take to CAT scan.  She does have some neck pain as well, collar applied.  Will obtain CT head, CT maxillofacial, CT C-spine and reassess.  Tetanus is up-to-date, per immunization records was given in 2015.  Patient does have some dried blood to the hairline along the frontal aspect of her head.  It is difficult to assess for wound at this time given matting with her hair.  Will clean wound once patient gets back from CT scan to further assess depth of wound.  Patient returned from CT scan she endorses that she actually stopped her Eliquis 2 weeks ago.  She was started on this during recent hospitalization in May when she was diagnosed with COVID-19 as well as new onset A. fib.  States she has a follow-up appointment with cardiology in another month and is unsure if they actually want her to stop the Eliquis or not.  We will have her contact them tomorrow for further clarification.   CT scans negative at this time.  C-collar removed.  Wound cleaned, approximately 3 cm laceration noted to the frontal scalp approximately 1 cm distally from hairline.  Will require staples at this time.   Laceration repaired with 4 staples.  Patient instructed to return to the ED in 10 days time for staple removal.  Given the  laceration in the inner lip will discharge home with antibiotics to cover for infection.  I do not feel patient needs suturing of this at this time as is not very deep and I suspect it will close fairly quickly. Pt stable for discharge at this time.   This note was prepared using Dragon voice recognition software and may include unintentional dictation errors due to the inherent limitations of voice recognition software.  Final Clinical Impression(s) / ED Diagnoses Final diagnoses:  Fall, initial encounter  Injury of head, initial encounter  Laceration of scalp, initial encounter  Lip laceration, initial encounter    Rx / DC Orders ED Discharge Orders         Ordered    amoxicillin-clavulanate (AUGMENTIN) 875-125 MG tablet  Every 12 hours     Discontinue  Reprint     01/17/20 1818           Discharge Instructions     Please return to the ED in 10 days time for staple removal. Your PCP can also remove these if they have a staple remover in their office.  I have prescribed antibiotics to cover for your lip lacerations given you bit down on your lip and these can get infected. I would recommend softer foods for the next couple of days while this closes on its own. You can also perform salt water gargles.   It is very important that you contact your cardiologist when their office reopens to discuss if they wanted you on the Eliquis long term.   Return to the ED IMMEDIATELY for any worsening symptoms including signs of infection to your scalp or lip - these include: Redness/swelling around the wound, drainage of pus, fevers > 100.4, chills.        Eustaquio Maize, PA-C 01/17/20 1820    Blanchie Dessert, MD 01/19/20 [phone]

## 2020-01-17 NOTE — ED Notes (Signed)
Only one LET used.

## 2020-01-17 NOTE — Telephone Encounter (Signed)
error 

## 2020-01-24 ENCOUNTER — Ambulatory Visit: Payer: Medicare HMO | Admitting: Internal Medicine

## 2020-01-27 ENCOUNTER — Other Ambulatory Visit: Payer: Self-pay

## 2020-01-27 ENCOUNTER — Encounter: Payer: Self-pay | Admitting: Internal Medicine

## 2020-01-27 ENCOUNTER — Ambulatory Visit (INDEPENDENT_AMBULATORY_CARE_PROVIDER_SITE_OTHER): Payer: Medicare HMO | Admitting: Internal Medicine

## 2020-01-27 VITALS — BP 132/82 | HR 81 | Temp 98.3°F | Wt 262.0 lb

## 2020-01-27 DIAGNOSIS — S0101XD Laceration without foreign body of scalp, subsequent encounter: Secondary | ICD-10-CM

## 2020-01-27 DIAGNOSIS — S0990XD Unspecified injury of head, subsequent encounter: Secondary | ICD-10-CM

## 2020-01-27 DIAGNOSIS — W19XXXD Unspecified fall, subsequent encounter: Secondary | ICD-10-CM

## 2020-01-27 NOTE — Patient Instructions (Signed)
Wound Closure Removal, Care After This sheet gives you information about how to care for yourself after your stitches (sutures), staples, or skin adhesives have been removed. Your health care provider may also give you more specific instructions. If you have problems or questions, contact your health care provider. What can I expect after the procedure? After your sutures or staples have been removed or your skin adhesives have fallen off, it is common to have:  Some discomfort and swelling in the area.  Slight redness in the area. Follow these instructions at home: If you have a bandage:  Wash your hands with soap and water before you change your bandage (dressing). If soap and water are not available, use hand sanitizer.  Change your dressing as told by your health care provider. If your dressing becomes wet or dirty, or develops a bad smell, change it as soon as possible.  If your dressing sticks to your skin, pour warm, clean water over it until it loosens and can be removed without pulling apart the wound edges. Pat the area dry with a soft, clean towel. Do not rub the wound because that may cause bleeding. Wound care   Keep the wound area dry and clean.  Check your wound every day for signs of infection. Check for: ? Redness, swelling, or pain. ? Fluid or blood. ? Warmth. ? Pus or a bad smell.  Wash your hands with soap and water before and after touching your wound.  Apply cream or ointment only as told by your health care provider. If you are using cream or ointment, wash the area with soap and water 2 times a day to remove all the cream or ointment. Rinse off the soap and pat the area dry with a clean towel.  If skin glue or adhesive strips were applied after sutures or staples were removed, leave these closures in place until they peel off on their own. If adhesive strip edges start to loosen and curl up, you may trim the loose edges. Do not remove adhesive strips completely  unless your health care provider tells you to do that.  Continue to protect the wound from injury.  Do not pick at your wound. Picking can cause an infection. Bathing  Do not take baths, swim, or use a hot tub until your health care provider approves.  Ask your health care provider when it is okay to shower.  Follow these steps for showering: ? If you have a dressing, remove it before getting into the shower. ? In the shower, allow soapy water to get on the wound. Avoid scrubbing the wound. ? When you get out of the shower, dry the wound by patting it with a clean towel. ? Reapply a dressing over the wound if needed. Scar care  When your wound has completely healed, take actions to help decrease the size of your scar: ? Wear sunscreen over the scar or cover it with clothing when you are outside. New scars get sunburned easily, which can make scarring worse. ? Gently massage the scarred area. This can decrease scar thickness. General instructions  Take over-the-counter and prescription medicines only as told by your health care provider.  Keep all follow-up visits as told by your health care provider. This is important. Contact a health care provider if:  You have redness, swelling, or pain around your wound.  You have fluid or blood coming from your wound.  Your wound feels warm to the touch.  You have pus   or a bad smell coming from your wound.  Your wound opens up.  You have chills. Get help right away if:  You have a fever.  You have redness that is spreading from your wound. Summary  Change your dressing as told by your health care provider. If your dressing becomes wet or dirty, or develops a bad smell, change it as soon as possible.  Check your wound every day for signs of infection.  Wash your hands with soap and water before and after touching your wound. This information is not intended to replace advice given to you by your health care provider. Make sure  you discuss any questions you have with your health care provider. Document Revised: 06/02/2017 Document Reviewed: 04/10/2017 Elsevier Patient Education  2020 [employer]  

## 2020-01-27 NOTE — Progress Notes (Signed)
Subjective:    Patient ID: Julia Mcfarland, female    DOB: February 10, 1946, 74 y.o.   MRN: [id number]  HPI  Pt presents to the clinic today for ER followup. She went to the ER after falling on a curb and hitting her head. She is on Eliquis.  CT head, cervical spine and maxillofacial negative for acute findings.  She did have a scalp laceration that was repaired with 4 staples.  She was discharged and advised to follow-up with her PCP.  Since discharge, she reports she has had slight headache which has since resolved. She denies dizziness, neck pain or visual changes.   Review of Systems  Past Medical History:  Diagnosis Date  . Allergy   . Arthritis   . Chicken pox   . Chronic venous insufficiency    a. Uses furosemide prn.  . Degenerative joint disease   . Morbid obesity ([place])   . Osteoarthritis   . PONV (postoperative nausea and vomiting)     Current Outpatient Medications  Medication Sig Dispense Refill  . albuterol (VENTOLIN HFA) 108 (90 Base) MCG/ACT inhaler Inhale 2 puffs into the lungs every 6 (six) hours as needed for wheezing or shortness of breath. 8 g 1  . amiodarone (PACERONE) 200 MG tablet Take 1 tablet (200 mg total) by mouth daily. (Patient not taking: Reported on 01/17/2020) 30 tablet 0  . amoxicillin-clavulanate (AUGMENTIN) 875-125 MG tablet Take 1 tablet by mouth every 12 (twelve) hours. 14 tablet 0  . apixaban (ELIQUIS) 5 MG TABS tablet Take 1 tablet (5 mg total) by mouth 2 (two) times daily. 60 tablet 0  . Ascorbic Acid (VITAMIN C) 1000 MG tablet Take 1,000 mg by mouth daily.    . Cholecalciferol (VITAMIN D3) 5000 UNITS CAPS Take 5,000 Units by mouth daily.     . fexofenadine (ALLEGRA ALLERGY) 180 MG tablet Take 1 tablet (180 mg total) by mouth daily. (Patient not taking: Reported on 01/17/2020) 90 tablet 2  . furosemide (LASIX) 40 MG tablet Take 60 mg by mouth daily.     . hydroxypropyl methylcellulose / hypromellose (ISOPTO TEARS / GONIOVISC) 2.5 % ophthalmic solution  Place 1 drop into both eyes daily as needed for dry eyes.    Marland Kitchen KLOR-CON M10 10 MEQ tablet TAKE 1 TABLET BY MOUTH EVERY DAY (Patient taking differently: Take 10 mEq by mouth. ) 90 tablet 0  . vitamin B-12 (CYANOCOBALAMIN) 500 MCG tablet Take 500 mcg by mouth daily.     No current facility-administered medications for this visit.    No Known Allergies  Family History  Problem Relation Age of Onset  . Arthritis Mother   . Heart disease Mother   . Hypertension Mother   . Diabetes Mother   . Stroke Father   . Hypertension Father   . Stroke Sister   . Hypertension Sister   . Heart disease Maternal Grandfather   . Diabetes Paternal Grandmother   . Heart disease Paternal Grandfather     Social History   Socioeconomic History  . Marital status: Married    Spouse name: Not on file  . Number of children: Not on file  . Years of education: Not on file  . Highest education level: Not on file  Occupational History  . Not on file  Tobacco Use  . Smoking status: Never Smoker  . Smokeless tobacco: Never Used  Vaping Use  . Vaping Use: Never used  Substance and Sexual Activity  . Alcohol use: Yes  Comment: occasional  . Drug use: No  . Sexual activity: Never  Other Topics Concern  . Not on file  Social History Narrative   Lives in [place] w/ husband.  Says she's relatively active but does not routinely exercise.   Social Determinants of Health   Financial Resource Strain:   . Difficulty of Paying Living Expenses:   Food Insecurity:   . Worried About Charity fundraiser in the Last Year:   . Arboriculturist in the Last Year:   Transportation Needs:   . Film/video editor (Medical):   Marland Kitchen Lack of Transportation (Non-Medical):   Physical Activity:   . Days of Exercise per Week:   . Minutes of Exercise per Session:   Stress:   . Feeling of Stress :   Social Connections:   . Frequency of Communication with Friends and Family:   . Frequency of Social Gatherings with  Friends and Family:   . Attends Religious Services:   . Active Member of Clubs or Organizations:   . Attends Archivist Meetings:   Marland Kitchen Marital Status:   Intimate Partner Violence:   . Fear of Current or Ex-Partner:   . Emotionally Abused:   Marland Kitchen Physically Abused:   . Sexually Abused:      Constitutional: Denies fever, malaise, fatigue, headache or abrupt weight changes.  HEENT: Denies eye pain, eye redness, ear pain, ringing in the ears, wax buildup, runny nose, nasal congestion, bloody nose, or sore throat. Respiratory: Denies difficulty breathing, shortness of breath, cough or sputum production.   Cardiovascular: Denies chest pain, chest tightness, palpitations or swelling in the hands or feet.  Musculoskeletal: Denies decrease in range of motion, difficulty with gait, muscle pain or joint pain and swelling.  Skin: Patient reports laceration of scalp.  Denies redness, rashes, lesions or ulcercations.  Neurological: Denies dizziness, difficulty with memory, difficulty with speech or problems with balance and coordination.    No other specific complaints in a complete review of systems (except as listed in HPI above).     Objective:   Physical Exam  BP (!) 132/82   Pulse 81   Temp 98.3 F (36.8 C) (Temporal)   Wt (!) 262 lb (118.8 kg)   SpO2 98%   BMI 47.92 kg/m    Wt Readings from Last 3 Encounters:  01/17/20 270 lb (122.5 kg)  12/04/19 273 lb (123.8 kg)  11/29/19 277 lb (125.6 kg)    General: Appears her stated age, obese, in NAD. Skin: Warm, dry and intact. 2 cm linear laceration, well healed, staples intact. HEENT: Head: normal shape and size; Eyes: sclera white, no icterus, conjunctiva pink, PERRLA and EOMs intact;  Cardiovascular: Normal rate and rhythm.  Pulmonary/Chest: Normal effort and positive vesicular breath sounds.  Musculoskeletal: Normal flexion, extension and rotation of the cervical spine. No bony tenderness noted over the spine.    Neurological: Alert and oriented.  Coordination normal.   BMET    Component Value Date/Time   NA 146 (H) 12/04/2019 1631   K 3.8 12/11/2019 1018   CL 99 12/04/2019 1631   CO2 29 12/04/2019 1631   GLUCOSE 210 (H) 12/04/2019 1631   GLUCOSE 145 (H) 11/25/2019 1012   BUN 10 12/04/2019 1631   CREATININE 0.98 12/04/2019 1631   CALCIUM 9.1 12/04/2019 1631   GFRNONAA 58 (L) 12/04/2019 1631   GFRAA 67 12/04/2019 1631    Lipid Panel     Component Value Date/Time   CHOL 133  10/29/2019 0450   TRIG 84 10/29/2019 0450   HDL 26 (L) 10/29/2019 0450   CHOLHDL 5.1 10/29/2019 0450   VLDL 17 10/29/2019 0450   LDLCALC 90 10/29/2019 0450    CBC    Component Value Date/Time   WBC 5.5 11/25/2019 1012   RBC 3.74 (L) 11/25/2019 1012   HGB 12.0 11/25/2019 1012   HCT 36.2 11/25/2019 1012   PLT 190.0 11/25/2019 1012   MCV 96.8 11/25/2019 1012   MCH 30.6 11/13/2019 0458   MCHC 33.1 11/25/2019 1012   RDW 18.9 (H) 11/25/2019 1012   LYMPHSABS 1.7 08/21/2008 1405   MONOABS 0.7 08/21/2008 1405   EOSABS 0.2 08/21/2008 1405   BASOSABS 0.1 08/21/2008 1405    Hgb A1C Lab Results  Component Value Date   HGBA1C 6.3 (H) 10/28/2019          Assessment & Plan:   ER follow-up for Head Injury with Scalp Laceration status post Fall:  ER notes, labs and imaging reviewed Staples removed by this provider No further intervention needed at this time  Return precautions discussed Webb Silversmith, NP This visit occurred during the SARS-CoV-2 public health emergency.  Safety protocols were in place, including screening questions prior to the visit, additional usage of staff PPE, and extensive cleaning of exam room while observing appropriate contact time as indicated for disinfecting solutions.

## 2020-02-06 DIAGNOSIS — H2513 Age-related nuclear cataract, bilateral: Secondary | ICD-10-CM | POA: Diagnosis not present

## 2020-02-06 DIAGNOSIS — H524 Presbyopia: Secondary | ICD-10-CM | POA: Diagnosis not present

## 2020-02-06 DIAGNOSIS — H35363 Drusen (degenerative) of macula, bilateral: Secondary | ICD-10-CM | POA: Diagnosis not present

## 2020-02-06 DIAGNOSIS — H40013 Open angle with borderline findings, low risk, bilateral: Secondary | ICD-10-CM | POA: Diagnosis not present

## 2020-02-06 DIAGNOSIS — H25013 Cortical age-related cataract, bilateral: Secondary | ICD-10-CM | POA: Diagnosis not present

## 2020-02-06 DIAGNOSIS — H43393 Other vitreous opacities, bilateral: Secondary | ICD-10-CM | POA: Diagnosis not present

## 2020-02-06 LAB — HM DIABETES EYE EXAM

## 2020-02-10 ENCOUNTER — Encounter: Payer: Self-pay | Admitting: Cardiovascular Disease

## 2020-02-10 ENCOUNTER — Encounter: Payer: Self-pay | Admitting: *Deleted

## 2020-02-10 ENCOUNTER — Ambulatory Visit: Payer: Medicare HMO | Admitting: Cardiovascular Disease

## 2020-02-10 ENCOUNTER — Other Ambulatory Visit: Payer: Self-pay

## 2020-02-10 VITALS — BP 129/73 | HR 72 | Ht 62.0 in | Wt 271.8 lb

## 2020-02-10 DIAGNOSIS — I700 Atherosclerosis of aorta: Secondary | ICD-10-CM

## 2020-02-10 DIAGNOSIS — I480 Paroxysmal atrial fibrillation: Secondary | ICD-10-CM

## 2020-02-10 DIAGNOSIS — E785 Hyperlipidemia, unspecified: Secondary | ICD-10-CM | POA: Diagnosis not present

## 2020-02-10 DIAGNOSIS — R600 Localized edema: Secondary | ICD-10-CM

## 2020-02-10 NOTE — Progress Notes (Signed)
Patient ID: Julia Mcfarland, female   DOB: [birth year], 74 y.o.   MRN: [id number] Patient enrolled for [employer] to ship a 14 day ZIO XT long term holter monitor to her home.

## 2020-02-10 NOTE — Patient Instructions (Signed)
Medication Instructions:  No changes *If you need a refill on your cardiac medications before your next appointment, please call your pharmacy*   Lab Work: None ordered If you have labs (blood work) drawn today and your tests are completely normal, you will receive your results only by: Marland Kitchen MyChart Message (if you have MyChart) OR . A paper copy in the mail If you have any lab test that is abnormal or we need to change your treatment, we will call you to review the results.   Testing/Procedures: Bryn Gull[facility]- Long Term Monitor Instructions   Your physician has requested you wear your ZIO patch monitor___14____days.   This is a single patch monitor.  [employer] supplies one patch monitor per enrollment.  Additional stickers are not available.   Please do not apply patch if you will be having a Nuclear Stress Test, Echocardiogram, Cardiac CT, MRI, or Chest Xray during the time frame you would be wearing the monitor. The patch cannot be worn during these tests.  You cannot remove and re-apply the ZIO XT patch monitor.   Your ZIO patch monitor will be sent USPS Priority mail from [facility] directly to your home address. The monitor may also be mailed to a PO BOX if home delivery is not available.   It may take 3-5 days to receive your monitor after you have been enrolled.   Once you have received you monitor, please review enclosed instructions.  Your monitor has already been registered assigning a specific monitor serial # to you.   Applying the monitor   Shave hair from upper left chest.   Hold abrader disc by orange tab.  Rub abrader in 40 strokes over left upper chest as indicated in your monitor instructions.   Clean area with 4 enclosed alcohol pads .  Use all pads to assure are is cleaned thoroughly.  Let dry.   Apply patch as indicated in monitor instructions.  Patch will be place under collarbone on left side of chest with arrow pointing upward.   Rub patch adhesive wings for  2 minutes.Remove white label marked "1".  Remove white label marked "2".  Rub patch adhesive wings for 2 additional minutes.   While looking in a mirror, press and release button in center of patch.  A small green light will flash 3-4 times .  This will be your only indicator the monitor has been turned on.     Do not shower for the first 24 hours.  You may shower after the first 24 hours.   Press button if you feel a symptom. You will hear a small click.  Record Date, Time and Symptom in the Patient Log Book.   When you are ready to remove patch, follow instructions on last 2 pages of Patient Log Book.  Stick patch monitor onto last page of Patient Log Book.   Place Patient Log Book in [place] box.  Use locking tab on box and tape box closed securely.  The [employer] has [employer] on it.  Please place in mailbox as soon as possible.  Your physician should have your test results approximately 7 days after the monitor has been mailed back to [facility].   Call [facility] at [phone] if you have questions regarding your ZIO XT patch monitor.  Call them immediately if you see an orange light blinking on your monitor.   If your monitor falls off in less than 4 days contact our Monitor department  at [phone].  If your monitor becomes loose or falls off after 4 days call [employer] at [phone] for suggestions on securing your monitor.     Follow-Up: At [facility], you and your health needs are our priority.  As part of our continuing mission to provide you with exceptional heart care, we have created designated Provider Care Teams.  These Care Teams include your primary Cardiologist (physician) and Advanced Practice Providers (APPs -  Physician Assistants and Nurse Practitioners) who all work together to provide you with the care you need, when you need it.  We recommend signing up for the patient portal called "MyChart".  Sign up information is  provided on this After Visit Summary.  MyChart is used to connect with patients for Virtual Visits (Telemedicine).  Patients are able to view lab/test results, encounter notes, upcoming appointments, etc.  Non-urgent messages can be sent to your provider as well.   To learn more about what you can do with MyChart, go to [email or web address].    Your next appointment:   Follow up as needed with Dr. Sallyanne Kuster

## 2020-02-10 NOTE — Progress Notes (Signed)
Cardiology Office Note:    Date:  02/10/2020   ID:  Julia Mcfarland, DOB [birth year], MRN [id number]  PCP:  [name], NP  [facility] Cardiologist:  Sanda Klein, MD  [place] Electrophysiologist:  None   Referring MD: [name], NP   Chief Complaint  Patient presents with  . Atrial Fibrillation    History of Present Illness:    Julia Mcfarland is a 74 y.o. female with a hx of obesity and leg edema, who had paroxysmal atrial fibrillation during the hospitalization with COVID-19 pneumonia earlier this year.  Unfortunately her husband died of Covid infection around that time as well.  Her daughter also had Covid pneumonia and still has problems with dyspnea and requires oxygen.  Julia Mcfarland herself has recovered well.  After using oxygen for a few weeks she is now back to baseline health status without any dyspnea at rest or with activity.  She received physical therapy for about 6 weeks, 3 days a week, following hospital discharge.  During that time her physical therapist would check her pulse oximeter and never saw her regular rhythm, according to Julia Mcfarland's report.  She has been taking furosemide as needed for a while for leg swelling.  She has noticed that if she skips more than a day the edema recurs.  She has been told that she snores, but she denies daytime hypersomnolence.  She stays busy all the time and never feels tired.  She does not take naps during the daytime.  She wakes up feeling refreshed.  She has never had inappropriate sleeping episodes.  She stopped taking amiodarone and apixaban about a month ago, after experiencing a fall in July 16.  This was a mechanical fall, not associated with dizziness or lightheadedness.  Past Medical History:  Diagnosis Date  . Allergy   . Arthritis   . Chicken pox   . Chronic venous insufficiency    a. Uses furosemide prn.  . Degenerative joint disease   . Morbid obesity ([place])   . Osteoarthritis   . PONV  (postoperative nausea and vomiting)     Past Surgical History:  Procedure Laterality Date  . ABDOMINAL HYSTERECTOMY  1988  . CARPAL TUNNEL RELEASE Right   . REPLACEMENT TOTAL KNEE Right 2010  . TOTAL KNEE ARTHROPLASTY Left 03/07/2017   Procedure: LEFT TOTAL KNEE ARTHROPLASTY;  Surgeon: Meredith Pel, MD;  Location: [place];  Service: Orthopedics;  Laterality: Left;    Current Medications: Current Meds  Medication Sig  . albuterol (VENTOLIN HFA) 108 (90 Base) MCG/ACT inhaler Inhale 2 puffs into the lungs every 6 (six) hours as needed for wheezing or shortness of breath.  . Ascorbic Acid (VITAMIN C) 1000 MG tablet Take 1,000 mg by mouth daily.  . Cholecalciferol (VITAMIN D3) 5000 UNITS CAPS Take 5,000 Units by mouth daily.   . fexofenadine (ALLEGRA ALLERGY) 180 MG tablet Take 1 tablet (180 mg total) by mouth daily.  . furosemide (LASIX) 40 MG tablet Take 60 mg by mouth daily.   . hydroxypropyl methylcellulose / hypromellose (ISOPTO TEARS / GONIOVISC) 2.5 % ophthalmic solution Place 1 drop into both eyes daily as needed for dry eyes.  . vitamin B-12 (CYANOCOBALAMIN) 500 MCG tablet Take 500 mcg by mouth daily.  . [DISCONTINUED] KLOR-CON M10 10 MEQ tablet TAKE 1 TABLET BY MOUTH EVERY DAY (Patient taking differently: Take 10 mEq by mouth. )     Allergies:   Patient has no known allergies.  Social History   Socioeconomic History  . Marital status: Married    Spouse name: Not on file  . Number of children: Not on file  . Years of education: Not on file  . Highest education level: Not on file  Occupational History  . Not on file  Tobacco Use  . Smoking status: Never Smoker  . Smokeless tobacco: Never Used  Vaping Use  . Vaping Use: Never used  Substance and Sexual Activity  . Alcohol use: Yes    Comment: occasional  . Drug use: No  . Sexual activity: Never  Other Topics Concern  . Not on file  Social History Narrative   Lives in [place] w/ husband.  Says she's  relatively active but does not routinely exercise.   Social Determinants of Health   Financial Resource Strain:   . Difficulty of Paying Living Expenses:   Food Insecurity:   . Worried About Charity fundraiser in the Last Year:   . Arboriculturist in the Last Year:   Transportation Needs:   . Film/video editor (Medical):   Marland Kitchen Lack of Transportation (Non-Medical):   Physical Activity:   . Days of Exercise per Week:   . Minutes of Exercise per Session:   Stress:   . Feeling of Stress :   Social Connections:   . Frequency of Communication with Friends and Family:   . Frequency of Social Gatherings with Friends and Family:   . Attends Religious Services:   . Active Member of Clubs or Organizations:   . Attends Archivist Meetings:   Marland Kitchen Marital Status:      Family History: The patient's family history includes Arthritis in her mother; Diabetes in her mother and paternal grandmother; Heart disease in her maternal grandfather, mother, and paternal grandfather; Hypertension in her father, mother, and sister; Stroke in her father and sister.  ROS:   Please see the history of present illness.     All other systems reviewed and are negative.  EKGs/Labs/Other Studies Reviewed:    The following studies were reviewed today: Echo 10/29/2019 1. Normal LV function; mild LAE.  2. Left ventricular ejection fraction, by estimation, is 55 to 60%. The  left ventricle has normal function. The left ventricle has no regional  wall motion abnormalities. Left ventricular diastolic function could not  be evaluated.  3. Right ventricular systolic function is normal. The right ventricular  size is normal. Tricuspid regurgitation signal is inadequate for assessing  PA pressure.  4. Left atrial size was mildly dilated.  5. The mitral valve is normal in structure. Trivial mitral valve  regurgitation. No evidence of mitral stenosis.  6. The aortic valve is tricuspid. Aortic valve  regurgitation is not  visualized. No aortic stenosis is present.  7. The inferior vena cava is normal in size with greater than 50%  respiratory variability, suggesting right atrial pressure of 3 mmHg.   EKG:  EKG is  ordered today.  The ekg ordered today demonstrates sinus rhythm with a ventricular couplet, but otherwise normal tracing  Recent Labs: 10/28/2019: TSH 0.725 11/07/2019: ALT 28; Magnesium 2.0 11/25/2019: Hemoglobin 12.0; Platelets 190.0 11/27/2019: BNP 43.8 12/04/2019: BUN 10; Creatinine, Ser 0.98; Sodium 146 12/11/2019: Potassium 3.8  Recent Lipid Panel    Component Value Date/Time   CHOL 133 10/29/2019 0450   TRIG 84 10/29/2019 0450   HDL 26 (L) 10/29/2019 0450   CHOLHDL 5.1 10/29/2019 0450   VLDL 17 10/29/2019 0450  [place] 90 10/29/2019 0450   LDLDIRECT 113.6 08/14/2013 1557    Physical Exam:    VS:  BP 129/73   Pulse 72   Ht 5\' 2"  (1.575 m)   Wt 271 lb 12.8 oz (123.3 kg)   SpO2 97%   BMI 49.71 kg/m     Wt Readings from Last 3 Encounters:  02/10/20 271 lb 12.8 oz (123.3 kg)  01/27/20 (!) 262 lb (118.8 kg)  01/17/20 270 lb (122.5 kg)     GEN: Morbidly obese, well nourished, well developed in no acute distress HEENT: Normal NECK: No JVD; No carotid bruits LYMPHATICS: No lymphadenopathy CARDIAC: RRR, no murmurs, rubs, gallops RESPIRATORY:  Clear to auscultation without rales, wheezing or rhonchi  ABDOMEN: Soft, non-tender, non-distended MUSCULOSKELETAL:  No edema; No deformity  SKIN: Warm and dry NEUROLOGIC:  Alert and oriented x 3 PSYCHIATRIC:  Normal affect   ASSESSMENT:    1. Paroxysmal atrial fibrillation (HCC)   2. Edema of both legs   3. Morbid obesity ([place])   4. Dyslipidemia   5. Atherosclerosis of aorta (HCC)    PLAN:    In order of problems listed above:  1. AFib: She had paroxysmal atrial fibrillation treated with amiodarone and apixaban during acute COVID-19 pneumonia.  She is no longer taking amiodarone or anticoagulation.  She is  in sinus rhythm today.  She was never aware of the arrhythmia.  Her echocardiogram does show a mildly dilated left atrium and she has morbid obesity, both of which increase the risk for recurrent atrial fibrillation.  CHA2DS2-VASc score is 2 (age and gender), maybe 3 if aortic atherosclerosis is considered equivalent for PAD.  Recommend an extended arrhythmia monitor. If atrial fibrillation is detected would start Eliquis 5 mg BID. 2. Leg edema: Could be related to peripheral venous insufficiency and obesity, but obstructive sleep apnea should be considered.  Her echo does not show signs of right heart enlargement or pulmonary hypertension.  She does not have daytime hypersomnolence. 3. Morbid obesity: Strongly recommend weight loss to reduce the likelihood of future arrhythmia recurrence and other health complications. 4. 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 5. HLP: She has very low HDL cholesterol at 26, but the total and LDL cholesterol is not elevated.  Weight loss and increase physical exercise is strongly recommended for this reason as well. 6. Aortic atherosclerosis: Incidentally noted on chest x-ray report October 28, 2019.  She does not have symptoms of CAD or PAD.  Risk factor modification is indicated, primarily via increased physical exercise and improved diet.      Medication Adjustments/Labs and Tests Ordered: Current medicines are reviewed at length with the patient today.  Concerns regarding medicines are outlined above.  Orders Placed This Encounter  Procedures  . LONG TERM MONITOR (3-14 DAYS)  . EKG 12-Lead   No orders of the defined types were placed in this encounter.   Patient Instructions  Medication Instructions:  No changes *If you need a refill on your cardiac medications before your next appointment, please call your pharmacy*   Lab Work: None ordered If you have labs (blood work) drawn today and your tests are completely normal, you will receive your results  only by: Marland Kitchen MyChart Message (if you have MyChart) OR . A paper copy in the mail If you have any lab test that is abnormal or we need to change your treatment, we will call you to review the results.   Testing/Procedures: [place] [place]- Long Term Monitor Instructions  Your physician has requested you wear your ZIO patch monitor___14____days.   This is a single patch monitor.  [employer] supplies one patch monitor per enrollment.  Additional stickers are not available.   Please do not apply patch if you will be having a Nuclear Stress Test, Echocardiogram, Cardiac CT, MRI, or Chest Xray during the time frame you would be wearing the monitor. The patch cannot be worn during these tests.  You cannot remove and re-apply the ZIO XT patch monitor.   Your ZIO patch monitor will be sent USPS Priority mail from [facility] directly to your home address. The monitor may also be mailed to a PO BOX if home delivery is not available.   It may take 3-5 days to receive your monitor after you have been enrolled.   Once you have received you monitor, please review enclosed instructions.  Your monitor has already been registered assigning a specific monitor serial # to you.   Applying the monitor   Shave hair from upper left chest.   Hold abrader disc by orange tab.  Rub abrader in 40 strokes over left upper chest as indicated in your monitor instructions.   Clean area with 4 enclosed alcohol pads .  Use all pads to assure are is cleaned thoroughly.  Let dry.   Apply patch as indicated in monitor instructions.  Patch will be place under collarbone on left side of chest with arrow pointing upward.   Rub patch adhesive wings for 2 minutes.Remove white label marked "1".  Remove white label marked "2".  Rub patch adhesive wings for 2 additional minutes.   While looking in a mirror, press and release button in center of patch.  A small green light will flash 3-4 times .  This will be your only indicator the  monitor has been turned on.     Do not shower for the first 24 hours.  You may shower after the first 24 hours.   Press button if you feel a symptom. You will hear a small click.  Record Date, Time and Symptom in the Patient Log Book.   When you are ready to remove patch, follow instructions on last 2 pages of Patient Log Book.  Stick patch monitor onto last page of Patient Log Book.   Place Patient Log Book in [place] box.  Use locking tab on box and tape box closed securely.  The [employer] has [employer] on it.  Please place in mailbox as soon as possible.  Your physician should have your test results approximately 7 days after the monitor has been mailed back to [facility].   Call [place] at [phone] if you have questions regarding your ZIO XT patch monitor.  Call them immediately if you see an orange light blinking on your monitor.   If your monitor falls off in less than 4 days contact our Monitor department at [phone].  If your monitor becomes loose or falls off after 4 days call [employer] at [phone] for suggestions on securing your monitor.     Follow-Up: At [facility], you and your health needs are our priority.  As part of our continuing mission to provide you with exceptional heart care, we have created designated Provider Care Teams.  These Care Teams include your primary Cardiologist (physician) and Advanced Practice Providers (APPs -  Physician Assistants and Nurse Practitioners) who all work together to provide you with the care you need, when you need it.  We recommend signing up for the patient portal called "MyChart".  Sign up information is provided on this After Visit Summary.  MyChart is used to connect with patients for Virtual Visits (Telemedicine).  Patients are able to view lab/test results, encounter notes, upcoming appointments, etc.  Non-urgent messages can be sent to your provider as well.   To learn more about  what you can do with MyChart, go to [email or web address].    Your next appointment:   Follow up as needed with Dr. Sallyanne Kuster    Signed, Sanda Klein, MD  02/10/2020 2:32 PM    [place]

## 2020-02-14 ENCOUNTER — Encounter: Payer: Self-pay | Admitting: Nurse Practitioner

## 2020-02-14 NOTE — Progress Notes (Signed)
This encounter was created in error - please disregard.

## 2020-02-15 ENCOUNTER — Other Ambulatory Visit (INDEPENDENT_AMBULATORY_CARE_PROVIDER_SITE_OTHER): Payer: Medicare HMO

## 2020-02-15 DIAGNOSIS — I480 Paroxysmal atrial fibrillation: Secondary | ICD-10-CM | POA: Diagnosis not present

## 2020-02-18 ENCOUNTER — Other Ambulatory Visit: Payer: Self-pay

## 2020-02-18 MED ORDER — FUROSEMIDE 40 MG PO TABS: 60.0000 mg | ORAL_TABLET | Freq: Every day | ORAL | 10 refills | Status: DC

## 2020-03-10 DIAGNOSIS — I480 Paroxysmal atrial fibrillation: Secondary | ICD-10-CM | POA: Diagnosis not present

## 2020-03-13 ENCOUNTER — Telehealth: Payer: Self-pay | Admitting: *Deleted

## 2020-03-13 ENCOUNTER — Other Ambulatory Visit: Payer: Self-pay

## 2020-03-13 ENCOUNTER — Other Ambulatory Visit
Admission: RE | Admit: 2020-03-13 | Discharge: 2020-03-13 | Disposition: A | Discharge status: Home / Self Care | Payer: Medicare HMO | Attending: Internal Medicine | Admitting: Internal Medicine

## 2020-03-13 ENCOUNTER — Encounter: Payer: Self-pay | Admitting: Internal Medicine

## 2020-03-13 ENCOUNTER — Ambulatory Visit: Payer: Medicare HMO | Admitting: Internal Medicine

## 2020-03-13 VITALS — BP 126/74 | HR 80 | Temp 97.3°F | Ht 62.0 in | Wt 272.0 lb

## 2020-03-13 DIAGNOSIS — Z9229 Personal history of other drug therapy: Secondary | ICD-10-CM

## 2020-03-13 DIAGNOSIS — Z8616 Personal history of COVID-19: Secondary | ICD-10-CM

## 2020-03-13 DIAGNOSIS — Z8709 Personal history of other diseases of the respiratory system: Secondary | ICD-10-CM | POA: Diagnosis not present

## 2020-03-13 DIAGNOSIS — R0600 Dyspnea, unspecified: Secondary | ICD-10-CM

## 2020-03-13 DIAGNOSIS — R0609 Other forms of dyspnea: Secondary | ICD-10-CM

## 2020-03-13 DIAGNOSIS — J849 Interstitial pulmonary disease, unspecified: Secondary | ICD-10-CM

## 2020-03-13 DIAGNOSIS — Z7189 Other specified counseling: Secondary | ICD-10-CM | POA: Insufficient documentation

## 2020-03-13 DIAGNOSIS — Z7185 Encounter for immunization safety counseling: Secondary | ICD-10-CM

## 2020-03-13 LAB — CBC WITH DIFFERENTIAL/PLATELET
Abs Immature Granulocytes: 0.01 10*3/uL (ref 0.00–0.07)
Basophils Absolute: 0 10*3/uL (ref 0.0–0.1)
Basophils Relative: 1 %
Eosinophils Absolute: 0.2 10*3/uL (ref 0.0–0.5)
Eosinophils Relative: 5 %
HCT: 38.5 % (ref 36.0–46.0)
Hemoglobin: 13.2 g/dL (ref 12.0–15.0)
Immature Granulocytes: 0 %
Lymphocytes Relative: 33 %
Lymphs Abs: 1.4 10*3/uL (ref 0.7–4.0)
MCH: 30.8 pg (ref 26.0–34.0)
MCHC: 34.3 g/dL (ref 30.0–36.0)
MCV: 89.7 fL (ref 80.0–100.0)
Monocytes Absolute: 0.6 10*3/uL (ref 0.1–1.0)
Monocytes Relative: 15 %
Neutro Abs: 2 10*3/uL (ref 1.7–7.7)
Neutrophils Relative %: 46 %
Platelets: 229 10*3/uL (ref 150–400)
RBC: 4.29 MIL/uL (ref 3.87–5.11)
RDW: 14.5 % (ref 11.5–15.5)
WBC: 4.2 10*3/uL (ref 4.0–10.5)
nRBC: 0 % (ref 0.0–0.2)

## 2020-03-13 LAB — SEDIMENTATION RATE: Sed Rate: 10 mm/hr (ref 0–30)

## 2020-03-13 MED ORDER — METOPROLOL SUCCINATE ER 25 MG PO TB24: 25.0000 mg | ORAL_TABLET | Freq: Every day | ORAL | 3 refills | Status: DC

## 2020-03-13 MED ORDER — APIXABAN 5 MG PO TABS: 5.0000 mg | ORAL_TABLET | Freq: Two times a day (BID) | ORAL | 11 refills | Status: DC

## 2020-03-13 NOTE — Progress Notes (Signed)
OV 2019-12-24  Subjective:  Patient ID: Julia Mcfarland, female , DOB: [birth year] , age 74 y.o. , MRN: [id number] , ADDRESS: [place] [address]   2019-12-24 -   Chief Complaint  Patient presents with  . Pulmonary Consult    pulmonary fibrosis     HPI Julia Mcfarland 74 y.o. -  Is the wife of [name] A. Creedon Female, 50 y.o., March 04, 1947MRN: [id number]. [name] died of covid 19 12-24-2019 at bed 52m3 [place] today AM. PJason Coop-> presented to [facility] for new consult. Post covid.      [facility] and discharged on Nov 14, 2019 after more than 2 weeks in the hospital.  She tells me that post discharge home physical therapy has been working with her and she is beginning to feel subjectively better.  However she does notice that her pedal edema got significantly worse while at home.  Lasix was prescribed but the oral Lasix has not worked well.  She is also noticing that when physical therapy works with that she has gotten progressively hypoxemic this past 1 week.  Today when she walked in and we sat her at rest on room air she was 84%.  Review of the records indicate it was similar when she visited cardiology clinic on Nov 25, 2019.  Oxygen was prescribed but this has not been started yet. The last known chest x-ray that I can personally visualized is on February 28, 2017.  This looks clear to me.  She then had Covid on October 28, 2019.  This chest x-ray started showing infiltrates.  The most recent chest x-ray is Nov 25, 2019 that shows severe bilateral infiltrates and ARDS pattern.  This is actually worse than the admission chest x-ray   So today she presents to the pulmonary clinic for new evaluation.  She is 84% on room air at rest.  We also found out that her husband just died this morning from COVID-19.  I called Dr. MVaughan Brownerand spoke to him and also JAlmyra Freeand the  [facility]  They confirm the patients husband is deceased.  They been trying to get hold of the wife my patient of today.   Echo October 29, 2019 had normal ejection fraction.  Diastolic dysfunction is not known. ROS - per HPI   OV 03/13/2020  Subjective:  Patient ID: Julia Mcfarland female , DOB: [birth year], age 74y.o. , MRN: [id number], [id number]  03/13/2020 -   Chief Complaint  Patient presents with  . Follow-up    Breathing has overall been doing well and she has been off of o2 for the past 2 months.  She has some congestion in the am's and has to clear her throat and states she has been doing this for years.      HPI Julia LIGHTLE89y.o. -presents for post Covid follow-up.  Last seen in MJune 22, 2021when she was extremely hypoxemic and still had significant pulmonary infiltrates after discharge from COVID-19.  Because of her husband's death on the same day she refused admission.  She now presents for follow-up.  She says that after she was started on oxygen she was able to slowly improve.  She says she has had a Holter  monitor which shows intermittent atrial fibrillation.  At this point in time she was able to exert in our office without oxygen.  Although she desaturated 8 points her oxygenation was still adequate and over 88%.  She only got mildly dyspneic.  She has chronic venous stasis edema.  She does not have cough fatigue nausea vomiting anxiety or depression.  She only has shortness of breath.  Symptom score is listed below.  Review of her medications indicates she is on amiodarone  She did ask about vaccination.  Particular she is interested in the Covid vaccine.  She wanted to get the best vaccine.  She is not interested in [employer] vaccine because of the risk of blood clots.  She is not had a flu shot.  She has not had a shingles vaccine.  She is not now interested in all this.     She is struggling emotionally being a widow and also daughter is still suffering from Covid long-haul.  But given the circumstances she seems to be doing okay   SYMPTOM SCALE - ILD 03/13/2020   O2 use o2 at home I think  Shortness of Breath 0 -> 5 scale with 5 being worst (score 6 If unable to do)  At rest 0  Simple tasks - showers, clothes change, eating, shaving 1  Household (dishes, doing bed, laundry) 1  Shopping 1  Walking level at own pace 1  Walking up Stairs 2  Total (30-36) Dyspnea Score 6  How bad is your cough? 0  How bad is your fatigue 0  How bad is nausea 0  How bad is vomiting?  0  How bad is diarrhea? 00  How bad is anxiety? 0  How bad is depression 0        Simple office walk 185 feet x  3 laps goal with forehead probe 03/13/2020   O2 used ra  Number laps completed 3  Comments about pace x  Resting Pulse Ox/HR 100% and 80/min  Final Pulse Ox/HR 92% and 113/min  Desaturated </= 88% no  Desaturated <= 3% points Yes,  [place]  Got Tachycardic >/= 90/min yes  Symptoms at end of test Mild dyspnea at end  Miscellaneous comments Talked and walked      ROS - per HPI     has a past medical history of Allergy, Arthritis, Chicken pox, Chronic venous insufficiency, Degenerative joint disease, Morbid obesity ([place]), Osteoarthritis, and PONV (postoperative nausea and vomiting).   reports that she has never smoked. She has never used smokeless tobacco.  Past Surgical History:  Procedure Laterality Date  . ABDOMINAL HYSTERECTOMY  1988  . CARPAL TUNNEL RELEASE Right   . REPLACEMENT TOTAL KNEE Right 2010  . 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 Influenza,inj,Quad PF,6+ Mos 07/27/2018  . Pneumococcal Conjugate-13 08/21/2014  .  Pneumococcal Polysaccharide-23 12/23/2015  . Tdap 08/14/2013    Family History  Problem Relation Age of Onset  . Arthritis Mother   . Heart disease Mother   . Hypertension Mother   . Diabetes Mother   . Stroke Father   . Hypertension Father   . Stroke Sister   . Hypertension Sister   . Heart disease Maternal Grandfather   . Diabetes Paternal Grandmother   . Heart disease  Paternal Grandfather      Current Outpatient Medications:  .  albuterol (VENTOLIN HFA) 108 (90 Base) MCG/ACT inhaler, Inhale 2 puffs into the lungs every 6 (six) hours as needed for wheezing or shortness of breath., Disp: 8 g, Rfl: 1 .  amiodarone (PACERONE) 200 MG tablet, Take 1 tablet (200 mg total) by mouth daily., Disp: 30 tablet, Rfl: 0 .  apixaban (ELIQUIS) 5 MG TABS tablet, Take 1 tablet (5 mg total) by mouth 2 (two) times daily., Disp: 60 tablet, Rfl: 11 .  Ascorbic Acid (VITAMIN C) 1000 MG tablet, Take 1,000 mg by mouth daily., Disp: , Rfl:  .  Cholecalciferol (VITAMIN D3) 5000 UNITS CAPS, Take 5,000 Units by mouth daily. , Disp: , Rfl:  .  fexofenadine (ALLEGRA ALLERGY) 180 MG tablet, Take 1 tablet (180 mg total) by mouth daily., Disp: 90 tablet, Rfl: 2 .  furosemide (LASIX) 40 MG tablet, Take 1.5 tablets (60 mg total) by mouth daily., Disp: 30 tablet, Rfl: 10 .  hydroxypropyl methylcellulose / hypromellose (ISOPTO TEARS / GONIOVISC) 2.5 % ophthalmic solution, Place 1 drop into both eyes daily as needed for dry eyes., Disp: , Rfl:  .  metoprolol succinate (TOPROL-XL) 25 MG 24 hr tablet, Take 1 tablet (25 mg total) by mouth daily. Take with or immediately following a meal., Disp: 30 tablet, Rfl: 3 .  vitamin B-12 (CYANOCOBALAMIN) 500 MCG tablet, Take 500 mcg by mouth daily., Disp: , Rfl:       Objective:   Vitals:   03/13/20 1124  BP: 126/74  Pulse: 80  Temp: (!) 97.3 F (36.3 C)  TempSrc: Temporal  SpO2: 100%  Weight: 272 lb (123.4 kg)  Height: '5\' 2"'  (1.575 m)    Estimated body mass index is  49.75 kg/m as calculated from the following:   Height as of this encounter: '5\' 2"'  (1.575 m).   Weight as of this encounter: 272 lb (123.4 kg).  '@WEIGHTCHANGE' @  [employer]   03/13/20 1124  Weight: 272 lb (123.4 kg)     Physical Exam  General Appearance:    Alert, cooperative, no distress, appears stated age - yes , Deconditioned looking - no , OBESE  - yes, Sitting on Wheelchair -  no  Head:    Normocephalic, without obvious abnormality, atraumatic  Eyes:    PERRL, conjunctiva/corneas clear,  Ears:    Normal TM's and external ear canals, both ears  Nose:   Nares normal, septum midline, mucosa normal, no drainage    or sinus tenderness. OXYGEN ON  - no . Patient is @ ra   Throat:   Lips, mucosa, and tongue normal; teeth and gums normal. Cyanosis on lips - no  Neck:   Supple, symmetrical, trachea midline, no adenopathy;    thyroid:  no enlargement/tenderness/nodules; no carotid   bruit or JVD  Back:     Symmetric, no curvature, ROM normal, no CVA tenderness  Lungs:     Distress - no , Wheeze no, Barrell Chest - no, Purse lip breathing - no, Crackles - no   Chest Wall:    No tenderness or deformity.    Heart:    Regular rate and rhythm, S1 and S2 normal, no rub   or gallop, Murmur - no  Breast Exam:    NOT DONE  Abdomen:     Soft, non-tender, bowel sounds active all four quadrants,    no masses, no organomegaly. Visceral obesity - yes  Genitalia:   NOT DONE  Rectal:  NOT DONE  Extremities:   Extremities - normal, Has Cane - no, Clubbing - no, Edema - no  Pulses:   2+ and symmetric all extremities  Skin:   Stigmata of Connective Tissue Disease - no  Lymph nodes:   Cervical, supraclavicular, and axillary nodes normal  Psychiatric:  Neurologic:   Pleasant - yes, Anxious - no, Flat affect - no  CAm-ICU - neg, Alert and Oriented x 3 - yes, Moves all 4s - yes, Speech - normal, Cognition - intact           Assessment:       ICD-10-CM   1. History of 2019 novel coronavirus  disease (COVID-19)  Z86.16   2. History of acute respiratory failure  Z87.09   3. Dyspnea on exertion  R06.00   4. Vaccine counseling  Z71.89   5. History of amiodarone therapy  Z92.29        Plan:     Patient Instructions  History of 2019 novel coronavirus disease (COVID-19) History of acute respiratory failure Dyspnea on exertion  -Glad he is significantly better after COVID-19 in the spring 2021 -Nevertheless you still have mild shortness of breath with exertion and a tendency to drop oxygen with exertion  Plan -Do high-resolution CT chest supine and prone -Do echocardiogram as follow-up -Do CBC, chemistry and ESR  Vaccine counseling  -Advised you to get Pfizer or Moderna  Covid vaccine (you are now greater than 90 days since your Covid] -High-dose flu shot when possible - Please talk to PCP [name], NP -  and ensure you get  shingrix ([place]) inactivated vaccine against shingles but maybe afer the above   Followup  - tele visit with app next 1-3 weeks to dsicuss results - if ESR high/significant infilammation in lung - we can discuss 3-9 week steroids        SIGNATURE    Dr. Brand Males, M.D., F.C.C.P,  Pulmonary and Critical Care Medicine Staff Physician, [place] Director - Interstitial Lung Disease  Program  Pulmonary [place] at [place], [place], [address]  Pager: [phone], If no answer or between  15:00h - 7:00h: call [phone] Telephone: [phone]  11:56 AM 03/13/2020

## 2020-03-13 NOTE — Addendum Note (Signed)
Addended by: Santiago Bur on: 03/13/2020 12:40 PM   Modules accepted: Orders

## 2020-03-13 NOTE — Patient Instructions (Addendum)
History of 2019 novel coronavirus disease (COVID-19) History of acute respiratory failure Dyspnea on exertion History of amiodarone therapy  -Glad he is significantly better after COVID-19 in the spring 2021 -Nevertheless you still have mild shortness of breath with exertion and a tendency to drop oxygen with exertion  Plan -Do high-resolution CT chest supine and prone -Do echocardiogram as follow-up -Do CBC, chemistry and ESR  Vaccine counseling  -Advised you to get Pfizer or Moderna  Covid vaccine (you are now greater than 90 days since your Covid] -High-dose flu shot when possible - Please talk to PCP [name], NP -  and ensure you get  shingrix ([employer]) inactivated vaccine against shingles but maybe afer the above   Followup  - tele visit with app next 1-3 weeks to dsicuss results - if ESR high/significant infilammation in lung - we can discuss 3-9 week steroids +/- discussion with cardiology about your amiodarone

## 2020-03-13 NOTE — Telephone Encounter (Signed)
-----   Message from Sanda Klein, MD sent at 03/12/2020  3:53 PM EDT ----- Unfortunately the event monitor still shows episodes of atrial fibrillation: one of them lasted for more than 2 days and was rapid (on the average 136 bpm). Please restart Eliquis 5 mg twice daily. Also recommend metoprolol succinate 25 mg once daily.

## 2020-03-13 NOTE — Telephone Encounter (Signed)
Patient made aware of results and verbalized understanding.  Metoprolol Succinate 25 mg once daily and Eliquis 5 mg twice daily sent into [facility] per her request.

## 2020-03-16 ENCOUNTER — Ambulatory Visit (INDEPENDENT_AMBULATORY_CARE_PROVIDER_SITE_OTHER): Payer: Medicare HMO

## 2020-03-16 ENCOUNTER — Other Ambulatory Visit: Payer: Self-pay

## 2020-03-16 DIAGNOSIS — R0609 Other forms of dyspnea: Secondary | ICD-10-CM

## 2020-03-16 DIAGNOSIS — R0600 Dyspnea, unspecified: Secondary | ICD-10-CM | POA: Diagnosis not present

## 2020-03-16 DIAGNOSIS — Z8616 Personal history of COVID-19: Secondary | ICD-10-CM

## 2020-03-16 LAB — ECHOCARDIOGRAM COMPLETE
AR max vel: 2.74 cm2
AV Peak grad: 13.4 mmHg
Ao pk vel: 1.83 m/s
Area-P 1/2: 2.87 cm2
Calc EF: 68 %
S' Lateral: 3.3 cm
Single Plane A2C EF: 65.1 %
Single Plane A4C EF: 71.1 %

## 2020-03-19 ENCOUNTER — Other Ambulatory Visit: Payer: Self-pay

## 2020-03-19 ENCOUNTER — Ambulatory Visit (INDEPENDENT_AMBULATORY_CARE_PROVIDER_SITE_OTHER): Payer: Medicare HMO | Admitting: Internal Medicine

## 2020-03-19 ENCOUNTER — Encounter: Payer: Self-pay | Admitting: Internal Medicine

## 2020-03-19 VITALS — BP 120/78 | HR 69 | Temp 97.1°F | Ht 61.0 in | Wt 269.0 lb

## 2020-03-19 DIAGNOSIS — Z0000 Encounter for general adult medical examination without abnormal findings: Secondary | ICD-10-CM

## 2020-03-19 DIAGNOSIS — M25512 Pain in left shoulder: Secondary | ICD-10-CM | POA: Diagnosis not present

## 2020-03-19 DIAGNOSIS — M810 Age-related osteoporosis without current pathological fracture: Secondary | ICD-10-CM | POA: Diagnosis not present

## 2020-03-19 DIAGNOSIS — I872 Venous insufficiency (chronic) (peripheral): Secondary | ICD-10-CM | POA: Diagnosis not present

## 2020-03-19 DIAGNOSIS — R7303 Prediabetes: Secondary | ICD-10-CM | POA: Diagnosis not present

## 2020-03-19 DIAGNOSIS — R0982 Postnasal drip: Secondary | ICD-10-CM

## 2020-03-19 DIAGNOSIS — G8929 Other chronic pain: Secondary | ICD-10-CM | POA: Diagnosis not present

## 2020-03-19 DIAGNOSIS — R05 Cough: Secondary | ICD-10-CM

## 2020-03-19 DIAGNOSIS — I4819 Other persistent atrial fibrillation: Secondary | ICD-10-CM | POA: Diagnosis not present

## 2020-03-19 DIAGNOSIS — R059 Cough, unspecified: Secondary | ICD-10-CM

## 2020-03-19 LAB — COMPREHENSIVE METABOLIC PANEL
ALT: 16 U/L (ref 0–35)
AST: 16 U/L (ref 0–37)
Albumin: 4.3 g/dL (ref 3.5–5.2)
Alkaline Phosphatase: 59 U/L (ref 39–117)
BUN: 14 mg/dL (ref 6–23)
CO2: 30 mEq/L (ref 19–32)
Calcium: 9.4 mg/dL (ref 8.4–10.5)
Chloride: 103 mEq/L (ref 96–112)
Creatinine, Ser: 1.03 mg/dL (ref 0.40–1.20)
GFR: 63.55 mL/min (ref >60.00)
Glucose, Bld: 117 mg/dL — ABNORMAL HIGH (ref 70–99)
Potassium: 4.2 mEq/L (ref 3.5–5.1)
Sodium: 139 mEq/L (ref 135–145)
Total Bilirubin: 0.5 mg/dL (ref 0.2–1.2)
Total Protein: 7.3 g/dL (ref 6.0–8.3)

## 2020-03-19 LAB — CBC
HCT: 39 % (ref 36.0–46.0)
Hemoglobin: 12.9 g/dL (ref 12.0–15.0)
MCHC: 33 g/dL (ref 30.0–36.0)
MCV: 93.4 fl (ref 78.0–100.0)
Platelets: 231 10*3/uL (ref 150.0–400.0)
RBC: 4.17 Mil/uL (ref 3.87–5.11)
RDW: 14.4 % (ref 11.5–15.5)
WBC: 3.9 10*3/uL — ABNORMAL LOW (ref 4.0–10.5)

## 2020-03-19 LAB — LIPID PANEL
Cholesterol: 191 mg/dL (ref 0–200)
HDL: 67.5 mg/dL (ref >39.00)
LDL Cholesterol: 112 mg/dL — ABNORMAL HIGH (ref 0–99)
NonHDL: 123.19
Total CHOL/HDL Ratio: 3
Triglycerides: 55 mg/dL (ref 0.0–149.0)
VLDL: 11 mg/dL (ref 0.0–40.0)

## 2020-03-19 LAB — VITAMIN D 25 HYDROXY (VIT D DEFICIENCY, FRACTURES): VITD: 33.67 ng/mL (ref 30.00–100.00)

## 2020-03-19 LAB — HEMOGLOBIN A1C: Hgb A1c MFr Bld: 6.1 % (ref 4.6–6.5)

## 2020-03-19 NOTE — Assessment & Plan Note (Signed)
We will check vitamin D today Bone density ordered Encouraged weightbearing exercise daily Continue calcium and vitamin D OTC

## 2020-03-19 NOTE — Patient Instructions (Signed)

## 2020-03-19 NOTE — Assessment & Plan Note (Signed)
A1c today Encouraged her to consume a low-carb diet and exercise for weight loss 

## 2020-03-19 NOTE — Progress Notes (Signed)
HPI  Patient presents the clinic today for her subsequent annual Medicare wellness exam.  She is also due to follow-up chronic conditions.  CVI: Managed on Furosemide as needed.  She denies shortness of breath but does have a wet cough first thing in the morning which she attributes to post nasal drip. She is not taking an antihistamine OTC.  Echo from 03/2020 reviewed.  Osteoporosis: She is taking Vitamin D and Calcium OTC daily.  She gets daily weightbearing exercise.  Bone density from 09/2014 reviewed.  Prediabetes: Her last A1c was 6.3%.  She is not currently taking any oral diabetic medication at this time.  She does not check her sugars routinely.  She does not check her feet routinely.  Her last eye exam was in 2021, [place].  Afib: Managed on Amiodarone, Metoprolol and Eliquis.  ECG from 02/2019 reviewed.  She follows with cardiology.   Past Medical History:  Diagnosis Date  . Allergy   . Arthritis   . Chicken pox   . Chronic venous insufficiency    a. Uses furosemide prn.  . Degenerative joint disease   . Morbid obesity ([place])   . Osteoarthritis   . PONV (postoperative nausea and vomiting)     Current Outpatient Medications  Medication Sig Dispense Refill  . albuterol (VENTOLIN HFA) 108 (90 Base) MCG/ACT inhaler Inhale 2 puffs into the lungs every 6 (six) hours as needed for wheezing or shortness of breath. 8 g 1  . amiodarone (PACERONE) 200 MG tablet Take 1 tablet (200 mg total) by mouth daily. 30 tablet 0  . apixaban (ELIQUIS) 5 MG TABS tablet Take 1 tablet (5 mg total) by mouth 2 (two) times daily. 60 tablet 11  . Ascorbic Acid (VITAMIN C) 1000 MG tablet Take 1,000 mg by mouth daily.    . Cholecalciferol (VITAMIN D3) 5000 UNITS CAPS Take 5,000 Units by mouth daily.     . fexofenadine (ALLEGRA ALLERGY) 180 MG tablet Take 1 tablet (180 mg total) by mouth daily. 90 tablet 2  . furosemide (LASIX) 40 MG tablet Take 1.5 tablets (60 mg total) by mouth daily. 30 tablet 10  .  hydroxypropyl methylcellulose / hypromellose (ISOPTO TEARS / GONIOVISC) 2.5 % ophthalmic solution Place 1 drop into both eyes daily as needed for dry eyes.    . metoprolol succinate (TOPROL-XL) 25 MG 24 hr tablet Take 1 tablet (25 mg total) by mouth daily. Take with or immediately following a meal. 30 tablet 3  . vitamin B-12 (CYANOCOBALAMIN) 500 MCG tablet Take 500 mcg by mouth daily.     No current facility-administered medications for this visit.    No Known Allergies  Family History  Problem Relation Age of Onset  . Arthritis Mother   . Heart disease Mother   . Hypertension Mother   . Diabetes Mother   . Stroke Father   . Hypertension Father   . Stroke Sister   . Hypertension Sister   . Heart disease Maternal Grandfather   . Diabetes Paternal Grandmother   . Heart disease Paternal Grandfather     Social History   Socioeconomic History  . Marital status: Married    Spouse name: Not on file  . Number of children: Not on file  . Years of education: Not on file  . Highest education level: Not on file  Occupational History  . Not on file  Tobacco Use  . Smoking status: Never Smoker  . Smokeless tobacco: Never Used  Vaping Use  .  Vaping Use: Never used  Substance and Sexual Activity  . Alcohol use: Yes    Comment: occasional  . Drug use: No  . Sexual activity: Never  Other Topics Concern  . Not on file  Social History Narrative   Lives in [place] w/ husband.  Says she's relatively active but does not routinely exercise.   Social Determinants of Health   Financial Resource Strain:   . Difficulty of Paying Living Expenses: Not on file  Food Insecurity:   . Worried About Charity fundraiser in the Last Year: Not on file  . Ran Out of Food in the Last Year: Not on file  Transportation Needs:   . Lack of Transportation (Medical): Not on file  . Lack of Transportation (Non-Medical): Not on file  Physical Activity:   . Days of Exercise per Week: Not on file  .  Minutes of Exercise per Session: Not on file  Stress:   . Feeling of Stress : Not on file  Social Connections:   . Frequency of Communication with Friends and Family: Not on file  . Frequency of Social Gatherings with Friends and Family: Not on file  . Attends Religious Services: Not on file  . Active Member of Clubs or Organizations: Not on file  . Attends Archivist Meetings: Not on file  . Marital Status: Not on file  Intimate Partner Violence:   . Fear of Current or Ex-Partner: Not on file  . Emotionally Abused: Not on file  . Physically Abused: Not on file  . Sexually Abused: Not on file    Hospitiliaztions: 01/2020- fall, 10/2019 admission for Afib with RVR  Health Maintenance:    Flu: 07/2018  Tetanus: 08/2013  Pneumovax: 12/2015  Prevnar: 08/2014  Zostavax: Never  Shingrix: Never  Covid: Pfizer  Mammogram: 09/2019  Pap Smear: 08/2014  Bone Density: 09/2014  Colon Screening: 10/2014  Eye Doctor: annually  Dental Exam: annually   Providers:   PCP: Webb Silversmith, NP  Cardiologist: Dr. Sallyanne Kuster  Pulmonologist: Dr. Chase Caller   I have personally reviewed and have noted:  1. The patient's medical and social history 2. Their use of alcohol, tobacco or illicit drugs 3. Their current medications and supplements 4. The patient's functional ability including ADL's, fall risks, home safety risks and hearing or visual impairment. 5. Diet and physical activities 6. Evidence for depression or mood disorder  Subjective:   Review of Systems:   Constitutional: Denies fever, malaise, fatigue, headache or abrupt weight changes.  HEENT: Denies eye pain, eye redness, ear pain, ringing in the ears, wax buildup, runny nose, nasal congestion, bloody nose, or sore throat. Respiratory: Pt reports intermittent cough. Denies difficulty breathing, shortness of breath, cough or sputum production.   Cardiovascular: Pt reports swelling in feet. Denies chest pain, chest tightness,  palpitations or swelling in the hands.  Gastrointestinal: Denies abdominal pain, bloating, constipation, diarrhea or blood in the stool.  GU: Denies urgency, frequency, pain with urination, burning sensation, blood in urine, odor or discharge. Musculoskeletal: Pt reports intermittent low back pain, left shoulder pain. Denies difficulty with gait, muscle pain or joint swelling.  Skin: Denies redness, rashes, lesions or ulcercations.  Neurological: Denies dizziness, difficulty with memory, difficulty with speech or problems with balance and coordination.  Psych: Denies anxiety, depression, SI/HI.  No other specific complaints in a complete review of systems (except as listed in HPI above).  Objective:  PE:  BP 120/78   Pulse 69   Temp (!)  97.1 F (36.2 C) (Temporal)   Ht '5\' 1"'  (1.549 m)   Wt 269 lb (122 kg)   SpO2 96%   BMI 50.83 kg/m   Wt Readings from Last 3 Encounters:  03/13/20 272 lb (123.4 kg)  02/10/20 271 lb 12.8 oz (123.3 kg)  01/27/20 (!) 262 lb (118.8 kg)    General: Appears her stated age, obese, in NAD. Skin: Warm, dry and intact. No ulcerations noted. HEENT: Head: normal shape and size; Eyes: sclera white, no icterus, conjunctiva pink, PERRLA and EOMs intact;  Neck: Neck supple, trachea midline. No masses, lumps or thyromegaly present.  Cardiovascular: Normal rate and rhythm. S1,S2 noted.  No murmur, rubs or gallops noted. No JVD or BLE edema. No carotid bruits noted. Pulmonary/Chest: Normal effort and positive vesicular breath sounds. No respiratory distress. No wheezes, rales or ronchi noted.  Abdomen: Soft and nontender. Normal bowel sounds. No distention or masses noted. Liver, spleen and kidneys non palpable. Musculoskeletal: Decreased external rotation of the left shoulder, normal internal rotation of the left shoulder. Positive drop can test on the right. No bony tenderness noted over the spine. Strength 4/5 LUE/5/5 RUE. Strength 5/5 BLE. No difficulty with  gait. Neurological: Alert and oriented. Cranial nerves II-XII grossly intact. Coordination normal.  Psychiatric: Mood and affect normal. Behavior is normal. Judgment and thought content normal.     BMET    Component Value Date/Time   NA 146 (H) 12/04/2019 1631   K 3.8 12/11/2019 1018   CL 99 12/04/2019 1631   CO2 29 12/04/2019 1631   GLUCOSE 210 (H) 12/04/2019 1631   GLUCOSE 145 (H) 11/25/2019 1012   BUN 10 12/04/2019 1631   CREATININE 0.98 12/04/2019 1631   CALCIUM 9.1 12/04/2019 1631   GFRNONAA 58 (L) 12/04/2019 1631   GFRAA 67 12/04/2019 1631    Lipid Panel     Component Value Date/Time   CHOL 133 10/29/2019 0450   TRIG 84 10/29/2019 0450   HDL 26 (L) 10/29/2019 0450   CHOLHDL 5.1 10/29/2019 0450   VLDL 17 10/29/2019 0450   LDLCALC 90 10/29/2019 0450    CBC    Component Value Date/Time   WBC 4.2 03/13/2020 1300   RBC 4.29 03/13/2020 1300   HGB 13.2 03/13/2020 1300   HCT 38.5 03/13/2020 1300   PLT 229 03/13/2020 1300   MCV 89.7 03/13/2020 1300   MCH 30.8 03/13/2020 1300   MCHC 34.3 03/13/2020 1300   RDW 14.5 03/13/2020 1300   LYMPHSABS 1.4 03/13/2020 1300   MONOABS 0.6 03/13/2020 1300   EOSABS 0.2 03/13/2020 1300   BASOSABS 0.0 03/13/2020 1300    Hgb A1C Lab Results  Component Value Date   HGBA1C 6.3 (H) 10/28/2019      Assessment and Plan:   Medicare Annual Wellness Visit:  Diet: She does eat some meat. She consumes fruits and veggies. She tries to avoid fried foods. She drinks mostly water, juice. Physical activity: Walking Depression/mood screen: Negative, PHQ 9 score of 0 Hearing: Intact to whispered voice Visual acuity: Grossly normal, performs annual eye exam  ADLs: Capable Fall risk: None Home safety: Good Cognitive evaluation: Intact to orientation, naming, recall and repetition EOL planning: No adv directives, full code/ I agree  Preventative Medicine: She will get her 2nd Covid vaccine, before getting her flu shot this  year.Tetanus, Pneumovax, Prevnar UTD.  She had her first Covid vaccine, second dose scheduled.  She will consider Shingrix vaccine and is aware she must get this at the  pharmacy.  Mammogram UTD.  She declines Pap smear.  Bone density ordered, she will get this done with her mammogram next year.  Colon screening UTD.  Encouraged her to consume a balanced diet and exercise regimen.  Advised her to see an eye doctor and dentist annually.  Will check CBC, C met, lipid, A1c and vitamin D today. Due dates for screening exam given to patient as part of her AVS.  Chronic Left Shoulder Pain:  Concerning for rotator cuff or labral tear She is not interested in x-ray or further evaluation at this time We will continue to monitor  Cough secondary to PND:  Advised her to start taking daily Allegra  Next appointment: 6 months, follow up chronic conditions   Webb Silversmith, NP This visit occurred during the SARS-CoV-2 public health emergency.  Safety protocols were in place, including screening questions prior to the visit, additional usage of staff PPE, and extensive cleaning of exam room while observing appropriate contact time as indicated for disinfecting solutions.

## 2020-03-19 NOTE — Assessment & Plan Note (Signed)
Continue Amiodarone, Metoprolol and Eliquis She will continue to follow with cardiology

## 2020-03-19 NOTE — Assessment & Plan Note (Signed)
Continue Furosemide Encourage weight loss, DASH diet and elevation C met today

## 2020-03-20 ENCOUNTER — Ambulatory Visit
Admission: RE | Admit: 2020-03-20 | Discharge: 2020-03-20 | Disposition: A | Discharge status: Home / Self Care | Payer: Medicare HMO | Source: Ambulatory Visit | Attending: Internal Medicine | Admitting: Internal Medicine

## 2020-03-20 ENCOUNTER — Other Ambulatory Visit: Payer: Self-pay

## 2020-03-20 DIAGNOSIS — J849 Interstitial pulmonary disease, unspecified: Secondary | ICD-10-CM | POA: Insufficient documentation

## 2020-03-20 DIAGNOSIS — R0600 Dyspnea, unspecified: Secondary | ICD-10-CM | POA: Insufficient documentation

## 2020-03-20 DIAGNOSIS — R918 Other nonspecific abnormal finding of lung field: Secondary | ICD-10-CM | POA: Diagnosis not present

## 2020-03-20 DIAGNOSIS — I2510 Atherosclerotic heart disease of native coronary artery without angina pectoris: Secondary | ICD-10-CM | POA: Diagnosis not present

## 2020-03-20 DIAGNOSIS — Z8616 Personal history of COVID-19: Secondary | ICD-10-CM

## 2020-03-20 DIAGNOSIS — R0609 Other forms of dyspnea: Secondary | ICD-10-CM

## 2020-03-20 DIAGNOSIS — I700 Atherosclerosis of aorta: Secondary | ICD-10-CM | POA: Diagnosis not present

## 2020-03-20 DIAGNOSIS — R0602 Shortness of breath: Secondary | ICD-10-CM | POA: Diagnosis not present

## 2020-03-23 ENCOUNTER — Ambulatory Visit (INDEPENDENT_AMBULATORY_CARE_PROVIDER_SITE_OTHER): Payer: Medicare HMO | Admitting: Acute Care

## 2020-03-23 ENCOUNTER — Other Ambulatory Visit: Payer: Self-pay

## 2020-03-23 ENCOUNTER — Encounter: Payer: Self-pay | Admitting: Acute Care

## 2020-03-23 DIAGNOSIS — R0600 Dyspnea, unspecified: Secondary | ICD-10-CM | POA: Diagnosis not present

## 2020-03-23 DIAGNOSIS — J069 Acute upper respiratory infection, unspecified: Secondary | ICD-10-CM

## 2020-03-23 DIAGNOSIS — U071 COVID-19: Secondary | ICD-10-CM | POA: Diagnosis not present

## 2020-03-23 NOTE — Progress Notes (Signed)
Virtual Visit via Telephone Note  I connected with Julia Mcfarland on 03/23/20 Julia  9:30 AM EDT by telephone and verified that I am speaking with the correct person using two identifiers.  Location: [name] Provider: [place], [place], [place], [address]   I discussed the limitations, risks, security and privacy concerns of performing an evaluation and management service by telephone and the availability of in person appointments. I also discussed with the patient that there may be a patient responsible charge related to this service. The patient expressed understanding and agreed to proceed  Synopsis 74 year old female admitted to  [facility] 10/28/2019 with Covid. Discharged 11/14/2019. Pt seen by Dr. Chase Caller for continued hypoxemia and dyspnea after discharge. Pt. Lost her husband to Covid, and her daughter is suffering from long haulers syndrome post Covid. Pt. Has continued to have slow but steady improvement in her dyspnea. .   History of Present Illness: Pt. Presents for follow up of her HRCT and her lab work as work up for continued dyspnea post Covid 10/2019. She states she has been doing well off her oxygen. She states she has had improvement in her dyspnea, but not completely resolved. She has had her first vaccine Pfizer, and is scheduled for her second . She has had improvement in her lower extremity edema. She is compliant with her Lasix 40 daily.She states she has had some congestion but feels it is allergies. No fever or change in secretions. She still has not had return of her smell.      Observations/Objective: HRCT 03/20/2020 Lungs/Pleura: There is scattered irregular and ground-glass opacity throughout the lungs, with some curvilinear opacity about the periphery of the lungs with subpleural sparing. Mild lobular air trapping on expiratory phase imaging. No pleural effusion or pneumothorax. There is scattered irregular and ground-glass opacity throughout the  lungs, with some curvilinear opacity about the periphery of the lungs with subpleural sparing. Findings are generally in keeping with post infectious/inflammatory sequelae of reported COVID infection. Mild lobular air trapping on expiratory phase imaging, consistent with small airways disease. Coronary artery disease.  Aortic Atherosclerosis    9/16: ESR >> 10 CBC    Component Value Date/Time   WBC 3.9 (L) 03/19/2020 1112   RBC 4.17 03/19/2020 1112   HGB 12.9 03/19/2020 1112   HCT 39.0 03/19/2020 1112   PLT 231.0 03/19/2020 1112   MCV 93.4 03/19/2020 1112   MCH 30.8 03/13/2020 1300   MCHC 33.0 03/19/2020 1112   RDW 14.4 03/19/2020 1112   LYMPHSABS 1.4 03/13/2020 1300   MONOABS 0.6 03/13/2020 1300   EOSABS 0.2 03/13/2020 1300   BASOSABS 0.0 03/13/2020 1300    Echo October 29, 2019 had normal ejection fraction.  Diastolic dysfunction is not known. ROS  Assessment and Plan: History of 2019 novel coronavirus disease (COVID-19) History of acute respiratory failure Dyspnea on exertion ESR WNL HRCT consistent with post infectious/inflammatory sequelae of reported COVID Plan Results of lab work and HRCT discussed with patient.  Follow up in 3 months with Dr. Chase Caller to ensure continued improvement. Call sooner for any worsening in breathing, change in secretions or fever. Congratulations on getting your first covid vaccine . Please keep appointment for Vaccine #2 in October Please contact office for sooner follow up if symptoms do not improve or worsen or seek emergency care   Seasonal Allergies Plan Add Zyrtec or non-sedating anti histamine of choice daily Try Flonase nasal spray ( Over the counter)   Follow  Up Instructions: Follow up with Dr. Chase Caller in 3 moinths Call if you need [place] sooner   I discussed the assessment and treatment plan with the patient. The patient was provided an opportunity to ask questions and all were answered. The patient agreed with the plan  and demonstrated an understanding of the instructions.   The patient was advised to call back or seek an in-person evaluation if the symptoms worsen or if the condition fails to improve as anticipated.  I provided 30 minutes of non-face-to-face time during this encounter.   Magdalen Spatz, NP 03/23/2020 10:20 AM

## 2020-03-23 NOTE — Patient Instructions (Signed)
It was good to talk with you today We have reviewed the Results of lab work and HRCT.  Follow up in 3 months with Dr. Chase Caller to ensure continued improvement. Call sooner for any worsening in breathing, change in secretions or fever. Congratulations on getting your first covid vaccine . Please keep appointment for Vaccine #2 in October Please contact office for sooner follow up if symptoms do not improve or worsen or seek emergency care   For your seasonal  Allergies Add Zyrtec or non-sedating anti histamine of choice daily Try Flonase nasal spray ( Over the counter)  Let us know if your secretions increase or if they change color, so we can get you seen. .   Follow Up Instructions: Follow up with Dr. Chase Caller in 3 moinths Call if you need [place] sooner

## 2020-04-08 ENCOUNTER — Telehealth: Payer: Self-pay | Admitting: Medical

## 2020-04-08 NOTE — Telephone Encounter (Signed)
Awaiting order to be faxed to make adjustment on form ./cy

## 2020-04-08 NOTE — Telephone Encounter (Signed)
Julia Mcfarland from [facility] calling in regards to an oxygen order. She states it was sent to them 8/6, but part of the form asks for the length of need and it is blank. She states she needs someone to put in 99 in the blank at section B. She states everything else is correct. Fax: [phone]

## 2020-04-09 NOTE — Telephone Encounter (Signed)
Faxed completed certificate of medical necessity, to the given fax number.

## 2020-04-13 NOTE — Telephone Encounter (Signed)
Form not received at this point, will forward to Teodoro Kil PA covering [place]

## 2020-04-13 NOTE — Telephone Encounter (Signed)
Julia Mcfarland with [employer] states that the certificate of medical necessity is not correct. She states that the leader flow needs to be 2 instead of 3. Also, questions 7, 8 and 9 were answered but those don't need to be filled out unless the leader flow is greater than 4. She states that this can be marked out. She also needs Daleen Snook to initial and date it. She will be faxing the form back so she can make corrections. Fax number to fax b ack is [phone]

## 2020-04-17 NOTE — Telephone Encounter (Signed)
Roby Lofts, PA- C was given the forms in office on 04/16/20 to fill out. I faxed the forms to [employer] to [phone] on 04/17/20 at 8:30 AM.

## 2020-06-06 ENCOUNTER — Other Ambulatory Visit: Payer: Self-pay | Admitting: Cardiovascular Disease

## 2020-06-12 DIAGNOSIS — Z86010 Personal history of colonic polyps: Secondary | ICD-10-CM | POA: Diagnosis not present

## 2020-06-12 DIAGNOSIS — T50B95A Adverse effect of other viral vaccines, initial encounter: Secondary | ICD-10-CM | POA: Diagnosis not present

## 2020-06-12 DIAGNOSIS — R0602 Shortness of breath: Secondary | ICD-10-CM | POA: Diagnosis not present

## 2020-06-23 ENCOUNTER — Telehealth: Payer: Self-pay | Admitting: Internal Medicine

## 2020-06-23 NOTE — Telephone Encounter (Signed)
Got request for clearance for colonoscopy for  Julia Mcfarland scheduled 09/04/20  This is by Dr Watt Climes of [facility]  - give appt to see APP  - sheet is on side desk

## 2020-06-24 NOTE — Telephone Encounter (Signed)
Pt is scheduled for a follow up with MR 08/14/20 for a follow up. Since this appt is before the scheduled date of pt's colonoscopy procedure, I have made a note in the f/u with MR that pt will need clearance for the colonoscopy.  Will keep this paper until pt's appt so it can then be filled out by MR.

## 2020-07-01 NOTE — Telephone Encounter (Signed)
Called and s/w Hayley @ [employer] she states that she put a "pop-up" message on pt's account so that when they "go to print it again" they will get this notification and "not send this" paperwork again.

## 2020-08-06 ENCOUNTER — Other Ambulatory Visit: Payer: Self-pay | Admitting: Internal Medicine

## 2020-08-06 DIAGNOSIS — Z1231 Encounter for screening mammogram for malignant neoplasm of breast: Secondary | ICD-10-CM

## 2020-08-10 ENCOUNTER — Telehealth: Payer: Self-pay

## 2020-08-10 NOTE — Telephone Encounter (Signed)
   [facility] Pre-operative Risk Assessment    HEARTCARE STAFF: - Please ensure there is not already an duplicate clearance open for this procedure. - Under Visit Info/Reason for Call, type in Other and utilize the format Clearance MM/DD/YY or Clearance TBD. Do not use dashes or single digits. - If request is for dental extraction, please clarify the # of teeth to be extracted.  Request for surgical clearance:  1. What type of surgery is being performed? Colonoscopy/endoscopy   2. When is this surgery scheduled? March 4,2022   3. What type of clearance is required (medical clearance vs. Pharmacy clearance to hold med vs. Both)? Both  4. Are there any medications that need to be held prior to surgery and how long?Eliquis   5. Practice name and name of physician performing surgery? [facility] Dr. Watt Climes   6. What is the office phone number? [phone]   7.   What is the office fax number? [phone]  8.   Anesthesia type (None, local, MAC, general) ? Propofol   Monia Pouch 08/10/2020, 11:00 AM  _________________________________________________________________   (provider comments below)

## 2020-08-10 NOTE — Telephone Encounter (Signed)
   Primary Cardiologist: Sanda Klein, MD  Chart reviewed as part of pre-operative protocol coverage. Patient was contacted 08/10/2020 in reference to pre-operative risk assessment for pending surgery as outlined below.  Julia Mcfarland was last seen on 02/10/20 by Dr, Sallyanne Kuster.  Since that day, Julia Mcfarland has done well.  She does not have a history of ischemic heart disease. She can complete more than 4.0 METS without angina (climbs stairs, moderate housework).   Per our clinical pharmacist: Patient with diagnosis of atrial fibrillationon Eliquis for anticoagulation.    Procedure: colonoscopy/endoscopy Date of procedure: 09/04/20   CHA2DS2-VASc Score = 3  This indicates a 3.2% annual risk of stroke. The patient's score is based upon: CHF History: Yes HTN History: No Diabetes History: No Stroke History: No Vascular Disease History: No Age Score: 1 Gender Score: 1  CrCl 93.7  (47.8 w/IBW) Platelet count 229  Per office protocol, patient can hold Eliquis for 1 days prior to procedure.   Patient will not need bridging with Lovenox (enoxaparin) around procedure.   Therefore, based on ACC/AHA guidelines, the patient would be at acceptable risk for the planned procedure without further cardiovascular testing.   The patient was advised that if she develops new symptoms prior to surgery to contact our office to arrange for a follow-up visit, and she verbalized understanding.  I will route this recommendation to the requesting party via Epic fax function and remove from pre-op pool. Please call with questions.  Ledora Bottcher, PA 08/10/2020, 3:38 PM

## 2020-08-10 NOTE — Telephone Encounter (Signed)
Patient with diagnosis of atrial fibrillationon Eliquis for anticoagulation.    Procedure: colonoscopy/endoscopy Date of procedure: 09/04/20   CHA2DS2-VASc Score = 3  This indicates a 3.2% annual risk of stroke. The patient's score is based upon: CHF History: Yes HTN History: No Diabetes History: No Stroke History: No Vascular Disease History: No Age Score: 1 Gender Score: 1  CrCl 93.7  (47.8 w/IBW) Platelet count 229  Per office protocol, patient can hold Eliquis for 1 days prior to procedure.   Patient will not need bridging with Lovenox (enoxaparin) around procedure.

## 2020-08-14 ENCOUNTER — Ambulatory Visit: Payer: Medicare HMO | Admitting: Internal Medicine

## 2020-08-14 ENCOUNTER — Other Ambulatory Visit: Payer: Self-pay

## 2020-08-14 ENCOUNTER — Encounter: Payer: Self-pay | Admitting: Internal Medicine

## 2020-08-14 VITALS — BP 124/72 | HR 88 | Temp 98.0°F | Ht 62.0 in | Wt 264.6 lb

## 2020-08-14 DIAGNOSIS — Z8616 Personal history of COVID-19: Secondary | ICD-10-CM

## 2020-08-14 DIAGNOSIS — Z01811 Encounter for preprocedural respiratory examination: Secondary | ICD-10-CM

## 2020-08-14 DIAGNOSIS — Z8709 Personal history of other diseases of the respiratory system: Secondary | ICD-10-CM

## 2020-08-14 NOTE — Patient Instructions (Addendum)
ICD-10-CM   1. History of 2019 novel coronavirus disease (COVID-19)  Z86.16   2. History of acute respiratory failure  Z87.09   3. Preop respiratory exam  Z01.811     From a COVID-19 pneumonia standpoint he is doing well.  His symptoms are better and your walk test is normal From a lung standpoint it is okay to have colonoscopy although with the obesity you could anticipate sedation problems  -This is a gastroenterology to decide.  Having anesthesia support might be a good idea  -In terms of stopping Eliquis prior to colonoscopy -> indication for Eliquis is atrial fibrillation so we will have to talk to cardiology  Continue to lose weight and be physically active  Follow-up -Do 1 year CT scan of the chest in September 2022 or October 2022 and return for follow-up at that time

## 2020-08-14 NOTE — Progress Notes (Signed)
shows intermittent atrial fibrillation.  At this point in time she was able to exert in our office without oxygen.  Although she desaturated 8 points her oxygenation was still adequate and over 88%.  She only got mildly dyspneic.  She has chronic venous stasis edema.  She does not have cough fatigue nausea vomiting anxiety or depression.  She only has shortness of breath.  Symptom score is listed below.  Review of her medications indicates she is on amiodarone  She did ask about vaccination.  Particular she is interested in the Covid vaccine.  She wanted to get the best vaccine.  She is not interested in [employer] vaccine because of the risk of blood clots.  She is not had a flu shot.  She has not had a shingles vaccine.  She is not now interested in all this.    She  is struggling emotionally being a widow and also daughter is still suffering from Covid long-haul.  But given the circumstances she seems to be doing okay  ROS - per HPI    Telel visit Sept 2021 History of Present Illness: Pt. Presents for follow up of her HRCT and her lab work as work up for continued dyspnea post Covid 10/2019. She states she has been doing well off her oxygen. She states she has had improvement in her dyspnea, but not completely resolved. She has had her first vaccine Pfizer, and is scheduled for her second . She has had improvement in her lower extremity edema. She is compliant with her Lasix 40 daily.She states she has had some congestion but feels it is allergies. No fever or change in secretions. She still has not had return of her smell.   HRCT 03/20/2020 Lungs/Pleura: There is scattered irregular and ground-glass opacity throughout the lungs, with some curvilinear opacity about the periphery of the lungs with subpleural sparing. Mild lobular air trapping on expiratory phase imaging. No pleural effusion or pneumothorax. There is scattered irregular and ground-glass opacity throughout the lungs, with some curvilinear opacity about the periphery of the lungs with subpleural sparing. Findings are generally in keeping with post infectious/inflammatory sequelae of reported COVID infection. Mild lobular air trapping on expiratory phase imaging, consistent with small airways disease. Coronary artery disease.  Aortic Atherosclerosis    9/16: ESR >> 10      OV 08/14/2020  Subjective:  Patient ID: Julia Mcfarland, female , DOB: 12/30/1945 , age 74 y.o. , MRN: [id number] , ADDRESS: [address] [place] [address] PCP Baity, Julia Mcfarland Patient Care Team: Baity, Julia Mcfarland as PCP - General (Internal Medicine) Croitoru, Julia Mcfarland as PCP - Cardiology (Cardiology)  This Provider for this visit: Treatment Team:  Attending Provider: , ,  Mcfarland    08/14/2020 -   Chief Complaint  Patient presents with  . Follow-up    Doing better   Follow-up because of Covid hypoxemic respiratory failure in May 2021   Anticoagulation with apixaban since May 2021 with paroxysmal atrial fibrillation -Duplex lower extremity Nov 03, 2019 - for DVT -No testing in May 2021 admission for CT angiogram pulmonary embolism -Elevated D-dimer of 16.96 on Nov 02, 2019 -> improved to 2.24 on Nov 07, 2019 -Indication for anticoagulation was proximal atrial fibrillation.  Chads 2 DS 2-BASC score 3 feb 2-22 -Followed by Dr. Croitoru  HPI Julia Mcfarland 74 y.o. -returns for follow-up.  This of post Covid pneumonia follow-up.  I saw her in September 2021.      OV 11/29/2019  Subjective:  Patient ID: Julia Mcfarland, female , DOB: 03/12/1946 , age 73 y.o. , MRN: [id number] , ADDRESS: [address] [place] [address]   11/29/2019 -   Chief Complaint  Patient presents with  . Pulmonary Consult    pulmonary fibrosis     HPI Julia Mcfarland 75 y.o. -  Is the wife of Julia Mcfarland Female, 74 y.o., 2/8/1947MRN: [id number]. Julia died of covid 19 11/29/2019 at bed 3m[address] [place] today AM. PAtient Julia Mcfarland -> presented to [facility] for new consult. Post covid.      She herself was admitted at [facility] on October 28, 2019 on the hospitalist service and discharged on Nov 14, 2019 after more than 2 weeks in the hospital.  She tells me that post discharge home physical therapy has been working with her and she is beginning to feel subjectively better.  However she does notice that her pedal edema got significantly worse while at home.  Lasix was prescribed but the oral Lasix has not worked well.  She is also noticing that when physical therapy works with that she has gotten progressively hypoxemic this past 1 week.  Today when she walked in and we sat her at rest on room air she was 84%.  Review of the records indicate it was similar when she visited cardiology clinic on Nov 25, 2019.  Oxygen was prescribed but this has not been started yet. The last known chest x-ray that I can personally visualized is on February 28, 2017.  This looks clear to me.  She then had Covid on October 28, 2019.  This chest x-ray started showing infiltrates.  The most recent chest x-ray is Nov 25, 2019 that shows severe bilateral infiltrates and ARDS pattern.  This is actually worse than the admission chest x-ray   So today she presents to the pulmonary clinic for new evaluation.  She is 84% on room air at rest.  We also found out that her husband just died this morning from COVID-19.  I called Julia Mcfarland and spoke to him and also Julie and the charge  nurse on [facility].  They confirm the patients husband is deceased.  They been trying to get hold of the wife my patient of today.   Echo October 29, 2019 had normal ejection fraction.  Diastolic dysfunction is not known. ROS - per HPI   OV 03/13/2020  Subjective:  Patient ID: Julia Mcfarland, female , DOB: 01/17/1946 , age 74 y.o. , MRN: [id number] , ADDRESS: [address] [place] [address]   03/13/2020 -   Chief Complaint  Patient presents with  . Follow-up    Breathing has overall been doing well and she has been off of o2 for the past 2 months.  She has some congestion in the am's and has to clear her throat and states she has been doing this for years.      HPI Daryle R Whitis 74 y.o. -presents for post Covid follow-up.  Last seen in May 2021 when she was extremely hypoxemic and still had significant pulmonary infiltrates after discharge from COVID-19.  Because of her husband's death on the same day she refused admission.  She now presents for follow-up.  She says that after she was started on oxygen she was able to slowly improve.  She says she has had a Holter monitor which   shows intermittent atrial fibrillation.  At this point in time she was able to exert in our office without oxygen.  Although she desaturated 8 points her oxygenation was still adequate and over 88%.  She only got mildly dyspneic.  She has chronic venous stasis edema.  She does not have cough fatigue nausea vomiting anxiety or depression.  She only has shortness of breath.  Symptom score is listed below.  Review of her medications indicates she is on amiodarone  She did ask about vaccination.  Particular she is interested in the Covid vaccine.  She wanted to get the best vaccine.  She is not interested in [employer] vaccine because of the risk of blood clots.  She is not had a flu shot.  She has not had a shingles vaccine.  She is not now interested in all this.    She  is struggling emotionally being a widow and also daughter is still suffering from Covid long-haul.  But given the circumstances she seems to be doing okay  ROS - per HPI    Telel visit Sept 2021 History of Present Illness: Pt. Presents for follow up of her HRCT and her lab work as work up for continued dyspnea post Covid 10/2019. She states she has been doing well off her oxygen. She states she has had improvement in her dyspnea, but not completely resolved. She has had her first vaccine Pfizer, and is scheduled for her second . She has had improvement in her lower extremity edema. She is compliant with her Lasix 40 daily.She states she has had some congestion but feels it is allergies. No fever or change in secretions. She still has not had return of her smell.   HRCT 03/20/2020 Lungs/Pleura: There is scattered irregular and ground-glass opacity throughout the lungs, with some curvilinear opacity about the periphery of the lungs with subpleural sparing. Mild lobular air trapping on expiratory phase imaging. No pleural effusion or pneumothorax. There is scattered irregular and ground-glass opacity throughout the lungs, with some curvilinear opacity about the periphery of the lungs with subpleural sparing. Findings are generally in keeping with post infectious/inflammatory sequelae of reported COVID infection. Mild lobular air trapping on expiratory phase imaging, consistent with small airways disease. Coronary artery disease.  Aortic Atherosclerosis    9/16: ESR >> 10      OV 08/14/2020  Subjective:  Patient ID: Julia Mcfarland, female , DOB: 12/30/1945 , age 74 y.o. , MRN: [id number] , ADDRESS: [address] [place] [address] PCP Baity, Julia Mcfarland Patient Care Team: Baity, Julia Mcfarland as PCP - General (Internal Medicine) Croitoru, Julia Mcfarland as PCP - Cardiology (Cardiology)  This Provider for this visit: Treatment Team:  Attending Provider: , ,  Mcfarland    08/14/2020 -   Chief Complaint  Patient presents with  . Follow-up    Doing better   Follow-up because of Covid hypoxemic respiratory failure in May 2021   Anticoagulation with apixaban since May 2021 with paroxysmal atrial fibrillation -Duplex lower extremity Nov 03, 2019 - for DVT -No testing in May 2021 admission for CT angiogram pulmonary embolism -Elevated D-dimer of 16.96 on Nov 02, 2019 -> improved to 2.24 on Nov 07, 2019 -Indication for anticoagulation was proximal atrial fibrillation.  Chads 2 DS 2-BASC score 3 feb 2-22 -Followed by Dr. Croitoru  HPI Julia Mcfarland 74 y.o. -returns for follow-up.  This of post Covid pneumonia follow-up.  I saw her in September 2021.    shows intermittent atrial fibrillation.  At this point in time she was able to exert in our office without oxygen.  Although she desaturated 8 points her oxygenation was still adequate and over 88%.  She only got mildly dyspneic.  She has chronic venous stasis edema.  She does not have cough fatigue nausea vomiting anxiety or depression.  She only has shortness of breath.  Symptom score is listed below.  Review of her medications indicates she is on amiodarone  She did ask about vaccination.  Particular she is interested in the Covid vaccine.  She wanted to get the best vaccine.  She is not interested in [employer] vaccine because of the risk of blood clots.  She is not had a flu shot.  She has not had a shingles vaccine.  She is not now interested in all this.    She  is struggling emotionally being a widow and also daughter is still suffering from Covid long-haul.  But given the circumstances she seems to be doing okay  ROS - per HPI    Telel visit Sept 2021 History of Present Illness: Pt. Presents for follow up of her HRCT and her lab work as work up for continued dyspnea post Covid 10/2019. She states she has been doing well off her oxygen. She states she has had improvement in her dyspnea, but not completely resolved. She has had her first vaccine Pfizer, and is scheduled for her second . She has had improvement in her lower extremity edema. She is compliant with her Lasix 40 daily.She states she has had some congestion but feels it is allergies. No fever or change in secretions. She still has not had return of her smell.   HRCT 03/20/2020 Lungs/Pleura: There is scattered irregular and ground-glass opacity throughout the lungs, with some curvilinear opacity about the periphery of the lungs with subpleural sparing. Mild lobular air trapping on expiratory phase imaging. No pleural effusion or pneumothorax. There is scattered irregular and ground-glass opacity throughout the lungs, with some curvilinear opacity about the periphery of the lungs with subpleural sparing. Findings are generally in keeping with post infectious/inflammatory sequelae of reported COVID infection. Mild lobular air trapping on expiratory phase imaging, consistent with small airways disease. Coronary artery disease.  Aortic Atherosclerosis    9/16: ESR >> 10      OV 08/14/2020  Subjective:  Patient ID: Julia Mcfarland, female , DOB: [birth year] , age 76 y.o. , MRN: [id number] , ADDRESS: [place] [address] PCP [name], Mcfarland Patient Care Team: [name], Mcfarland as PCP - General (Internal Medicine) Sanda Klein, Mcfarland as PCP - Cardiology (Cardiology)  This Provider for this visit: Treatment Team:  Attending Provider: Brand Males,  Mcfarland    08/14/2020 -   Chief Complaint  Patient presents with  . Follow-up    Doing better   Follow-up because of Covid hypoxemic respiratory failure in May 2021   Anticoagulation with apixaban since May 2021 with paroxysmal atrial fibrillation -Duplex lower extremity Nov 03, 2019 - for DVT -No testing in May 2021 admission for CT angiogram pulmonary embolism -Elevated D-dimer of 16.96 on Nov 02, 2019 -> improved to 2.24 on Nov 07, 2019 -Indication for anticoagulation was proximal atrial fibrillation.  Chads 2 DS 2-BASC score 3 feb 2-22 -Followed by Dr. Sallyanne Kuster  HPI Julia Mcfarland 75 y.o. -returns for follow-up.  This of post Covid pneumonia follow-up.  I saw her in September 2021.  shows intermittent atrial fibrillation.  At this point in time she was able to exert in our office without oxygen.  Although she desaturated 8 points her oxygenation was still adequate and over 88%.  She only got mildly dyspneic.  She has chronic venous stasis edema.  She does not have cough fatigue nausea vomiting anxiety or depression.  She only has shortness of breath.  Symptom score is listed below.  Review of her medications indicates she is on amiodarone  She did ask about vaccination.  Particular she is interested in the Covid vaccine.  She wanted to get the best vaccine.  She is not interested in [employer] vaccine because of the risk of blood clots.  She is not had a flu shot.  She has not had a shingles vaccine.  She is not now interested in all this.    She  is struggling emotionally being a widow and also daughter is still suffering from Covid long-haul.  But given the circumstances she seems to be doing okay  ROS - per HPI    Telel visit Sept 2021 History of Present Illness: Pt. Presents for follow up of her HRCT and her lab work as work up for continued dyspnea post Covid 10/2019. She states she has been doing well off her oxygen. She states she has had improvement in her dyspnea, but not completely resolved. She has had her first vaccine Pfizer, and is scheduled for her second . She has had improvement in her lower extremity edema. She is compliant with her Lasix 40 daily.She states she has had some congestion but feels it is allergies. No fever or change in secretions. She still has not had return of her smell.   HRCT 03/20/2020 Lungs/Pleura: There is scattered irregular and ground-glass opacity throughout the lungs, with some curvilinear opacity about the periphery of the lungs with subpleural sparing. Mild lobular air trapping on expiratory phase imaging. No pleural effusion or pneumothorax. There is scattered irregular and ground-glass opacity throughout the lungs, with some curvilinear opacity about the periphery of the lungs with subpleural sparing. Findings are generally in keeping with post infectious/inflammatory sequelae of reported COVID infection. Mild lobular air trapping on expiratory phase imaging, consistent with small airways disease. Coronary artery disease.  Aortic Atherosclerosis    9/16: ESR >> 10      OV 08/14/2020  Subjective:  Patient ID: Julia Mcfarland, female , DOB: 12/30/1945 , age 74 y.o. , MRN: [id number] , ADDRESS: [address] [place] [address] PCP Baity, Julia Mcfarland Patient Care Team: Baity, Julia Mcfarland as PCP - General (Internal Medicine) Croitoru, Julia Mcfarland as PCP - Cardiology (Cardiology)  This Provider for this visit: Treatment Team:  Attending Provider: , ,  Mcfarland    08/14/2020 -   Chief Complaint  Patient presents with  . Follow-up    Doing better   Follow-up because of Covid hypoxemic respiratory failure in May 2021   Anticoagulation with apixaban since May 2021 with paroxysmal atrial fibrillation -Duplex lower extremity Nov 03, 2019 - for DVT -No testing in May 2021 admission for CT angiogram pulmonary embolism -Elevated D-dimer of 16.96 on Nov 02, 2019 -> improved to 2.24 on Nov 07, 2019 -Indication for anticoagulation was proximal atrial fibrillation.  Chads 2 DS 2-BASC score 3 feb 2-22 -Followed by Dr. Croitoru  HPI Julia Mcfarland 74 y.o. -returns for follow-up.  This of post Covid pneumonia follow-up.  I saw her in September 2021.

## 2020-08-14 NOTE — Addendum Note (Signed)
Addended byAdalberto Cole M on: 08/14/2020 12:12 PM   Modules accepted: Orders

## 2020-08-21 ENCOUNTER — Other Ambulatory Visit: Payer: Self-pay | Admitting: Gastroenterology

## 2020-08-25 ENCOUNTER — Other Ambulatory Visit: Payer: Self-pay

## 2020-08-25 NOTE — Progress Notes (Signed)
Attempted to obtain medical history via telephone, unable to reach at this time. I left a voicemail to return pre surgical testing department's phone call.  

## 2020-08-28 ENCOUNTER — Other Ambulatory Visit: Payer: Self-pay

## 2020-08-28 ENCOUNTER — Other Ambulatory Visit
Admission: RE | Admit: 2020-08-28 | Discharge: 2020-08-28 | Disposition: A | Discharge status: Home / Self Care | Payer: Medicare HMO | Source: Ambulatory Visit | Attending: Gastroenterology | Admitting: Gastroenterology

## 2020-08-28 ENCOUNTER — Other Ambulatory Visit: Payer: Medicare HMO

## 2020-08-28 DIAGNOSIS — Z20822 Contact with and (suspected) exposure to covid-19: Secondary | ICD-10-CM | POA: Insufficient documentation

## 2020-08-28 DIAGNOSIS — Z01812 Encounter for preprocedural laboratory examination: Secondary | ICD-10-CM | POA: Insufficient documentation

## 2020-08-28 LAB — SARS CORONAVIRUS 2 (TAT 6-24 HRS): SARS Coronavirus 2: NEGATIVE

## 2020-09-01 ENCOUNTER — Other Ambulatory Visit: Payer: Self-pay

## 2020-09-01 ENCOUNTER — Encounter (HOSPITAL_COMMUNITY)
Admission: RE | Disposition: A | Discharge status: Home / Self Care | Payer: Self-pay | Source: Home / Self Care | Attending: Gastroenterology

## 2020-09-01 ENCOUNTER — Ambulatory Visit (HOSPITAL_COMMUNITY)
Admission: RE | Admit: 2020-09-01 | Discharge: 2020-09-01 | Disposition: A | Discharge status: Home / Self Care | Payer: Medicare HMO | Attending: Gastroenterology | Admitting: Gastroenterology

## 2020-09-01 ENCOUNTER — Encounter (HOSPITAL_COMMUNITY): Payer: Self-pay | Admitting: Gastroenterology

## 2020-09-01 ENCOUNTER — Ambulatory Visit (HOSPITAL_COMMUNITY): Payer: Medicare HMO | Admitting: Anesthesiology

## 2020-09-01 DIAGNOSIS — Z86010 Personal history of colonic polyps: Secondary | ICD-10-CM | POA: Diagnosis not present

## 2020-09-01 DIAGNOSIS — K621 Rectal polyp: Secondary | ICD-10-CM | POA: Insufficient documentation

## 2020-09-01 DIAGNOSIS — Z86718 Personal history of other venous thrombosis and embolism: Secondary | ICD-10-CM | POA: Insufficient documentation

## 2020-09-01 DIAGNOSIS — D125 Benign neoplasm of sigmoid colon: Secondary | ICD-10-CM | POA: Diagnosis not present

## 2020-09-01 DIAGNOSIS — D122 Benign neoplasm of ascending colon: Secondary | ICD-10-CM | POA: Diagnosis not present

## 2020-09-01 DIAGNOSIS — Z1211 Encounter for screening for malignant neoplasm of colon: Secondary | ICD-10-CM | POA: Insufficient documentation

## 2020-09-01 DIAGNOSIS — K635 Polyp of colon: Secondary | ICD-10-CM | POA: Diagnosis not present

## 2020-09-01 DIAGNOSIS — K5730 Diverticulosis of large intestine without perforation or abscess without bleeding: Secondary | ICD-10-CM | POA: Insufficient documentation

## 2020-09-01 DIAGNOSIS — I4819 Other persistent atrial fibrillation: Secondary | ICD-10-CM | POA: Diagnosis not present

## 2020-09-01 DIAGNOSIS — D126 Benign neoplasm of colon, unspecified: Secondary | ICD-10-CM | POA: Diagnosis not present

## 2020-09-01 HISTORY — PX: COLONOSCOPY WITH PROPOFOL: SHX5780

## 2020-09-01 HISTORY — PX: POLYPECTOMY: SHX5525

## 2020-09-01 SURGERY — COLONOSCOPY WITH PROPOFOL
Anesthesia: Monitor Anesthesia Care

## 2020-09-01 MED ORDER — SODIUM CHLORIDE 0.9 % IV SOLN: INTRAVENOUS | Status: DC

## 2020-09-01 MED ORDER — LACTATED RINGERS IV SOLN
INTRAVENOUS | Status: DC
  Administered 2020-09-01: 1000 mL via INTRAVENOUS

## 2020-09-01 MED ORDER — PROPOFOL 500 MG/50ML IV EMUL
INTRAVENOUS | Status: DC | PRN
  Administered 2020-09-01: 125 ug/kg/min via INTRAVENOUS

## 2020-09-01 SURGICAL SUPPLY — 22 items

## 2020-09-01 NOTE — Transfer of Care (Signed)
Immediate Anesthesia Transfer of Care Note  [name]  Procedure(s) Performed: Procedure(s): COLONOSCOPY WITH PROPOFOL (N/A) POLYPECTOMY  Patient Location: PACU and Endoscopy Unit  Anesthesia Type:MAC  Level of Consciousness: awake, alert  and oriented  Airway & Oxygen Therapy: Patient Spontanous Breathing and Patient connected to nasal cannula oxygen  Post-op Assessment: Report given to RN and Post -op Vital signs reviewed and stable  Post vital signs: Reviewed and stable  Last Vitals:  Vitals:   09/01/20 1139  BP: (!) 143/77  Pulse: 67  Resp: 17  Temp: 36.9 C  SpO2: 267%    Complications: No apparent anesthesia complications

## 2020-09-01 NOTE — Progress Notes (Signed)
Julia Mcfarland 12:41 PM  Subjective: Patient is asymptomatic from a GI standpoint and she is due for colonic screening and she has no new complaints since she was seen recently in our office  Objective: Vital signs stable afebrile no acute distress exam please see preassessment evaluation  Assessment: History of colon polyp  Plan: Okay to proceed with colonoscopy with anesthesia assistance  [facility]  office [phone] After 5PM or if no answer call [phone]

## 2020-09-01 NOTE — Op Note (Addendum)
[facility] Patient Name: Julia Mcfarland Procedure Date: 09/01/2020 MRN: [id number] Attending MD: Clarene Essex , MD Date of Birth: February 21, 1946 CSN: [id number] Age: 75 Admit Type: Outpatient Procedure:                Colonoscopy Indications:              High risk colon cancer surveillance: Personal                            history of colonic polyps, Last colonoscopy over 5                            years ago Providers:                Clarene Essex, MD, Clyde Lundborg, RN, Laverda Sorenson,                            Technician, Eliberto Ivory, CRNA Referring MD:              Medicines:                Propofol total dose 400 mg IV, 50 mg IV lidocaine Complications:            No immediate complications. Estimated Blood Loss:     Estimated blood loss: none. Procedure:                Pre-Anesthesia Assessment:                           - Prior to the procedure, a History and Physical                            was performed, and patient medications and                            allergies were reviewed. The patient's tolerance of                            previous anesthesia was also reviewed. The risks                            and benefits of the procedure and the sedation                            options and risks were discussed with the patient.                            All questions were answered, and informed consent                            was obtained. Prior Anticoagulants: The patient has                            taken Eliquis (apixaban), last dose was 2 days  prior to procedure. ASA Grade Assessment: III - A                            patient with severe systemic disease. After                            reviewing the risks and benefits, the patient was                            deemed in satisfactory condition to undergo the                            procedure.                           After obtaining informed consent, the  colonoscope                            was passed under direct vision. Throughout the                            procedure, the patient's blood pressure, pulse, and                            oxygen saturations were monitored continuously. The                            CF-HQ190L ([id number]) Olympus colonoscope was                            introduced through the anus and advanced to the the                            cecum, identified by appendiceal orifice and                            ileocecal valve. The ileocecal valve, appendiceal                            orifice, and rectum were photographed. The                            colonoscopy was performed without difficulty. The                            patient tolerated the procedure well. The quality                            of the bowel preparation was adequate. Scope In: 12:56:52 PM Scope Out: 1:17:19 PM Scope Withdrawal Time: 0 hours 15 minutes 32 seconds  Total Procedure Duration: 0 hours 20 minutes 27 seconds  Findings:      Multiple small and large-mouthed diverticula were found in the sigmoid       colon.      A few small-mouthed diverticula  were found in the descending colon.      A small polyp was found in the proximal ascending colon. The polyp was       semi-sessile. The polyp was removed with a cold snare. Resection and       retrieval were complete.      A few hyperplastic polyps were found in the rectum and distal sigmoid       colon. The polyps were small in size. These were biopsied with a cold       forceps for histology.      The exam was otherwise without abnormality. Impression:               - Diverticulosis in the sigmoid colon.                           - Diverticulosis in the descending colon.                           - One small polyp in the proximal ascending colon,                            removed with a cold snare. Resected and retrieved.                           - A few small polyps in the  rectum and in the                            distal sigmoid colon. Biopsied.                           - The examination was otherwise normal. Moderate Sedation:      Not Applicable - Patient had care per Anesthesia. Recommendation:           - Patient has a contact number available for                            emergencies. The signs and symptoms of potential                            delayed complications were discussed with the                            patient. Return to normal activities tomorrow.                            Written discharge instructions were provided to the                            patient.                           - Soft diet today.                           - Continue present medications.                           -  Resume Eliquis (apixaban) at prior dose tomorrow.                           - Await pathology results.                           - Repeat colonoscopy in 5 years for surveillance                            based on pathology results.                           - Return to GI office PRN.                           - Telephone GI clinic for pathology results in 1                            week.                           - Telephone GI clinic if symptomatic PRN. Procedure Code(s):        --- Professional ---                           [phone], Colonoscopy, flexible; with removal of                            tumor(s), polyp(s), or other lesion(s) by snare                            technique                           45380, 47, Colonoscopy, flexible; with biopsy,                            single or multiple Diagnosis Code(s):        --- Professional ---                           Z86.010, Personal history of colonic polyps                           K63.5, Polyp of colon                           K62.1, Rectal polyp                           K57.30, Diverticulosis of large intestine without                            perforation or abscess without  bleeding CPT copyright 2019 American Medical Association. All rights reserved. The codes documented in this report are preliminary and upon coder review may  be revised to meet current compliance requirements. Clarene Essex, MD 09/01/2020  1:28:41 PM This report has been signed electronically. Number of Addenda: 0

## 2020-09-01 NOTE — Anesthesia Preprocedure Evaluation (Addendum)
Anesthesia Evaluation  Patient identified by MRN, date of birth, ID band Patient awake    Reviewed: Allergy & Precautions, NPO status , Patient's Chart, lab work & pertinent test results  History of Anesthesia Complications (+) PONV and history of anesthetic complications  Airway Mallampati: I  TM Distance: >3 FB Neck ROM: Full    Dental  (+) Edentulous Upper, Edentulous Lower, Dental Advisory Given   Pulmonary neg pulmonary ROS,    Pulmonary exam normal        Cardiovascular Normal cardiovascular exam+ dysrhythmias Atrial Fibrillation      Neuro/Psych negative neurological ROS     GI/Hepatic negative GI ROS, Neg liver ROS,   Endo/Other  Morbid obesity  Renal/GU negative Renal ROS     Musculoskeletal negative musculoskeletal ROS (+)   Abdominal   Peds  Hematology negative hematology ROS (+)   Anesthesia Other Findings   Reproductive/Obstetrics                            Anesthesia Physical Anesthesia Plan  ASA: III  Anesthesia Plan: MAC   Post-op Pain Management:    Induction:   PONV Risk Score and Plan: 3 and Ondansetron  Airway Management Planned: Natural Airway  Additional Equipment:   Intra-op Plan:   Post-operative Plan:   Informed Consent: I have reviewed the patients History and Physical, chart, labs and discussed the procedure including the risks, benefits and alternatives for the proposed anesthesia with the patient or authorized representative who has indicated his/her understanding and acceptance.     Dental advisory given  Plan Discussed with: Anesthesiologist and CRNA  Anesthesia Plan Comments:        Anesthesia Quick Evaluation

## 2020-09-01 NOTE — Anesthesia Postprocedure Evaluation (Signed)
Anesthesia Post Note  [name]  Procedure(s) Performed: COLONOSCOPY WITH PROPOFOL (N/A ) POLYPECTOMY     Patient location during evaluation: Endoscopy Anesthesia Type: MAC Level of consciousness: awake and alert Pain management: pain level controlled Vital Signs Assessment: post-procedure vital signs reviewed and stable Respiratory status: spontaneous breathing, nonlabored ventilation, respiratory function stable and patient connected to nasal cannula oxygen Cardiovascular status: blood pressure returned to baseline and stable Postop Assessment: no apparent nausea or vomiting Anesthetic complications: no   No complications documented.  Last Vitals:  Vitals:   09/01/20 1330 09/01/20 1340  BP: 121/80 (!) 142/91  Pulse: 70 67  Resp: 15 (!) 21  Temp:    SpO2: 100% 100%    Last Pain:  Vitals:   09/01/20 1340  TempSrc:   PainSc: 0-No pain                 Liliauna Santoni DANIEL

## 2020-09-01 NOTE — Discharge Instructions (Signed)
Call if question or problem otherwise if doing well tomorrow may resume your Eliquis and call for biopsy report in 1 week and follow-up as needed and probable repeat colonoscopy in 5 years if doing well medically.  YOU HAD AN ENDOSCOPIC PROCEDURE TODAY: Refer to the procedure report and other information in the discharge instructions given to you for any specific questions about what was found during the examination. If this information does not answer your questions, please call [facility] office at [phone] to clarify.   YOU SHOULD EXPECT: Some feelings of bloating in the abdomen. Passage of more gas than usual. Walking can help get rid of the air that was put into your GI tract during the procedure and reduce the bloating. If you had a lower endoscopy (such as a colonoscopy or flexible sigmoidoscopy) you may notice spotting of blood in your stool or on the toilet paper. Some abdominal soreness may be present for a day or two, also.  DIET: Your first meal following the procedure should be a light meal and then it is ok to progress to your normal diet. A half-sandwich or bowl of soup is an example of a good first meal. Heavy or fried foods are harder to digest and may make you feel nauseous or bloated. Drink plenty of fluids but you should avoid alcoholic beverages for 24 hours. If you had a esophageal dilation, please see attached instructions for diet.   ACTIVITY: Your care partner should take you home directly after the procedure. You should plan to take it easy, moving slowly for the rest of the day. You can resume normal activity the day after the procedure however YOU SHOULD NOT DRIVE, use power tools, machinery or perform tasks that involve climbing or major physical exertion for 24 hours (because of the sedation medicines used during the test).   SYMPTOMS TO REPORT IMMEDIATELY: A gastroenterologist can be reached at any hour. Please call [phone]  for any of the following symptoms:   . Following lower endoscopy (colonoscopy, flexible sigmoidoscopy) Excessive amounts of blood in the stool  Significant tenderness, worsening of abdominal pains  Swelling of the abdomen that is new, acute  Fever of 100 or higher    FOLLOW UP:  If any biopsies were taken you will be contacted by phone or by letter within the next 1-3 weeks. Call [phone]  if you have not heard about the biopsies in 3 weeks.  Please also call with any specific questions about appointments or follow up tests.

## 2020-09-02 LAB — SURGICAL PATHOLOGY

## 2020-09-08 ENCOUNTER — Ambulatory Visit
Admission: RE | Admit: 2020-09-08 | Discharge: 2020-09-08 | Disposition: A | Discharge status: Home / Self Care | Payer: Medicare HMO | Source: Ambulatory Visit | Attending: Internal Medicine | Admitting: Internal Medicine

## 2020-09-08 ENCOUNTER — Other Ambulatory Visit: Payer: Self-pay

## 2020-09-08 DIAGNOSIS — Z1231 Encounter for screening mammogram for malignant neoplasm of breast: Secondary | ICD-10-CM | POA: Diagnosis not present

## 2020-10-05 DIAGNOSIS — I872 Venous insufficiency (chronic) (peripheral): Secondary | ICD-10-CM | POA: Diagnosis not present

## 2020-10-05 DIAGNOSIS — Z0000 Encounter for general adult medical examination without abnormal findings: Secondary | ICD-10-CM | POA: Diagnosis not present

## 2020-10-05 DIAGNOSIS — M810 Age-related osteoporosis without current pathological fracture: Secondary | ICD-10-CM | POA: Diagnosis not present

## 2020-10-05 DIAGNOSIS — Z0189 Encounter for other specified special examinations: Secondary | ICD-10-CM | POA: Diagnosis not present

## 2020-10-05 DIAGNOSIS — J45909 Unspecified asthma, uncomplicated: Secondary | ICD-10-CM | POA: Diagnosis not present

## 2020-10-05 DIAGNOSIS — E559 Vitamin D deficiency, unspecified: Secondary | ICD-10-CM | POA: Diagnosis not present

## 2020-10-05 DIAGNOSIS — Z79899 Other long term (current) drug therapy: Secondary | ICD-10-CM | POA: Diagnosis not present

## 2020-10-05 DIAGNOSIS — I4891 Unspecified atrial fibrillation: Secondary | ICD-10-CM | POA: Diagnosis not present

## 2020-11-17 ENCOUNTER — Other Ambulatory Visit: Payer: Self-pay | Admitting: Internal Medicine

## 2020-11-27 NOTE — Telephone Encounter (Signed)
Pharmacy requests refill on: albuterol inhaler  LAST REFILL: 09/19/19, 1 refill  LAST OV:   03/19/20 Annual Exam with Baity NEXT OV:  No upcoming appts PHARMACY:  [facility] [place]

## 2021-01-12 ENCOUNTER — Ambulatory Visit (INDEPENDENT_AMBULATORY_CARE_PROVIDER_SITE_OTHER): Payer: Medicare HMO | Admitting: Family Medicine

## 2021-01-12 ENCOUNTER — Other Ambulatory Visit: Payer: Self-pay

## 2021-01-12 ENCOUNTER — Encounter: Payer: Self-pay | Admitting: Family Medicine

## 2021-01-12 DIAGNOSIS — M25562 Pain in left knee: Secondary | ICD-10-CM | POA: Diagnosis not present

## 2021-01-12 DIAGNOSIS — M25561 Pain in right knee: Secondary | ICD-10-CM | POA: Diagnosis not present

## 2021-01-12 NOTE — Progress Notes (Signed)
Office Visit Note   [name]           Date of Birth: 07-01-1946           MRN: [id number] Visit Date: 01/12/2021 Requested by: [name], NP [address] [place],  [place] [address] PCP: [name], NP  Subjective: Chief Complaint  Patient presents with   Left Knee - Pain    Left knee feels like it will give way when she is walking - x several days now. Swelling in both knees. 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 Marlou Sa. Ambulates with cane.    Right Knee - Pain    HPI: She is here with bilateral knee pain, but primarily in the left knee.  She has had both knees replaced, the left knee was 2018 and the right knee was about 10 years ago.  She was doing fairly well until about 5 days ago when all of a sudden she woke up with pain in her left knee and it feels like it is going to give way when she stands up to walk.  She has had swelling in both knees.  She does not take medications for them because she is on anticoagulants.  Pain seems to be primarily in the anterior knee.               ROS: No fevers or chills.  All other systems were reviewed and are negative.  Objective: Vital Signs: There were no vitals taken for this visit.  Physical Exam:  General:  Alert and oriented, in no acute distress. Pulm:  Breathing unlabored. Psy:  Normal mood, congruent affect. Skin: No erythema or warmth. Left knee: 1+ effusion, full active extension and flexion of about 110 degrees.  She has tenderness around the patellofemoral joint.  No tenderness over the quadriceps or patellar tendons.    Imaging: No results found.  Assessment & Plan: Acute left knee pain, etiology uncertain. -She will try ice for the next few days.  If symptoms persist, she will come back in for 2 view left knee x-rays.     Procedures: No procedures performed        PMFS History: Patient Active Problem List   Diagnosis Date Noted   Persistent atrial fibrillation ([place])  10/28/2019   Chronic venous insufficiency 02/18/2019   Prediabetes 08/21/2014   Osteoporosis 08/14/2013   Past Medical History:  Diagnosis Date   Allergy    Arthritis    Chicken pox    Chronic venous insufficiency    a. Uses furosemide prn.   Degenerative joint disease    Morbid obesity ([place])    Osteoarthritis    PONV (postoperative nausea and vomiting)     Family History  Problem Relation Age of Onset   Arthritis Mother    Heart disease Mother    Hypertension Mother    Diabetes Mother    Stroke Father    Hypertension Father    Stroke Sister    Hypertension Sister    Heart disease Maternal Grandfather    Diabetes Paternal Grandmother    Heart disease Paternal Grandfather     Past Surgical History:  Procedure Laterality Date   ABDOMINAL HYSTERECTOMY  1988   CARPAL TUNNEL RELEASE Right    COLONOSCOPY WITH PROPOFOL N/A 09/01/2020   Procedure: COLONOSCOPY WITH PROPOFOL;  Surgeon: Clarene Essex, MD;  Location: [facility];  Service: Endoscopy;  Laterality: N/A;   POLYPECTOMY  09/01/2020  Procedure: POLYPECTOMY;  Surgeon: Clarene Essex, MD;  Location: [facility];  Service: Endoscopy;;   REPLACEMENT TOTAL KNEE Right 2010   TOTAL KNEE ARTHROPLASTY Left 03/07/2017   Procedure: LEFT TOTAL KNEE ARTHROPLASTY;  Surgeon: Meredith Pel, MD;  Location: [facility];  Service: Orthopedics;  Laterality: Left;   Social History   Occupational History   Not on file  Tobacco Use   Smoking status: Never   Smokeless tobacco: Never  Vaping Use   Vaping Use: Never used  Substance and Sexual Activity   Alcohol use: Yes    Comment: occasional   Drug use: No   Sexual activity: Never

## 2021-01-13 ENCOUNTER — Encounter: Payer: Self-pay | Admitting: Internal Medicine

## 2021-03-22 ENCOUNTER — Other Ambulatory Visit: Payer: Self-pay

## 2021-03-22 ENCOUNTER — Ambulatory Visit
Admission: RE | Admit: 2021-03-22 | Discharge: 2021-03-22 | Disposition: A | Discharge status: Home / Self Care | Payer: Medicare HMO | Source: Ambulatory Visit | Attending: Internal Medicine | Admitting: Internal Medicine

## 2021-03-22 DIAGNOSIS — Z8709 Personal history of other diseases of the respiratory system: Secondary | ICD-10-CM

## 2021-03-22 DIAGNOSIS — Z8616 Personal history of COVID-19: Secondary | ICD-10-CM

## 2021-07-30 ENCOUNTER — Other Ambulatory Visit: Payer: Self-pay

## 2021-07-30 ENCOUNTER — Encounter: Payer: Self-pay | Admitting: Surgical

## 2021-07-30 ENCOUNTER — Ambulatory Visit (INDEPENDENT_AMBULATORY_CARE_PROVIDER_SITE_OTHER): Payer: Medicare HMO

## 2021-07-30 ENCOUNTER — Ambulatory Visit: Payer: Medicare HMO | Admitting: Surgical

## 2021-07-30 DIAGNOSIS — Z96652 Presence of left artificial knee joint: Secondary | ICD-10-CM

## 2021-07-30 NOTE — Progress Notes (Signed)
Office Visit Note   [name]           Date of Birth: 18-Jun-1946           MRN: [id number] Visit Date: 07/30/2021 Requested by: [name], NP [address] [place],  [place] [address] PCP: [name], NP  Subjective: Chief Complaint  Patient presents with   Left Knee - Pain    HPI: Julia Mcfarland is a 76 y.o. female who presents to the office complaining of left knee pain.  Patient has history of bilateral total knee arthroplasty.  Left total knee arthroplasty performed in 2018.  She states her left knee has more mobility than her right knee.  Left knee has occasional pain and occasional instability episodes about every other day.  Has not caused her to fall.  She states that she is quite active with keeping up with yard work, housework, Garment/textile technologist.  Denies any fevers, chills, night sweats, drainage, change in the appearance of the incision.  She does not feel ill.  No groin pain.  No back pain, radicular pain, numbness/tingling in her knee..                ROS: All systems reviewed are negative as they relate to the chief complaint within the history of present illness.  Patient denies fevers or chills.  Assessment & Plan: Visit Diagnoses:  1. History of left knee replacement     Plan: Patient is a 76 year old female who presents for evaluation of left knee pain and instability.  Radiographs of the left knee taken today demonstrate stable prosthesis.  No sign of infection and no effusion today.  No concern for radiculopathy causing weakness.  Discussed options available patient and after discussion, she would like to try strength training program at home by performing straight leg raises with and without ankle weights.  She will try this and see if this improves her knee stability.  No obvious reason on exam today why her knee would give out on her.  If she is still having knee instability in March, she will reach back out to the office.  A handicap placard was provided  for her today.  Follow-Up Instructions: No follow-ups on file.   Orders:  Orders Placed This Encounter  Procedures   XR Knee 1-2 Views Left   No orders of the defined types were placed in this encounter.     Procedures: No procedures performed   Clinical Data: No additional findings.  Objective: Vital Signs: There were no vitals taken for this visit.  Physical Exam:  Constitutional: Patient appears well-developed HEENT:  Head: Normocephalic Eyes:EOM are normal Neck: Normal range of motion Cardiovascular: Normal rate Pulmonary/chest: Effort normal Neurologic: Patient is alert Skin: Skin is warm Psychiatric: Patient has normal mood and affect  Ortho Exam: Ortho exam demonstrates left knee with 0 degrees extension and 100 degrees of knee flexion.  No effusion noted.  No warmth over the left knee.  Incision is well-healed from prior surgery.  No calf tenderness.  Negative Homans' sign.  Excellent hip flexion, quadricep, hamstring, dorsiflexion, plantarflexion strength.  Negative straight leg raise.  No pain with hip range of motion.  No mid flexion instability.  No significant hyperextension.  Specialty Comments:  No specialty comments available.  Imaging: No results found.   PMFS History: Patient Active Problem List   Diagnosis Date Noted   Persistent atrial fibrillation ([place]) 10/28/2019   Chronic venous insufficiency 02/18/2019  Prediabetes 08/21/2014   Osteoporosis 08/14/2013   Past Medical History:  Diagnosis Date   Allergy    Arthritis    Chicken pox    Chronic venous insufficiency    a. Uses furosemide prn.   Degenerative joint disease    Morbid obesity ([place])    Osteoarthritis    PONV (postoperative nausea and vomiting)     Family History  Problem Relation Age of Onset   Arthritis Mother    Heart disease Mother    Hypertension Mother    Diabetes Mother    Stroke Father    Hypertension Father    Stroke Sister    Hypertension Sister    Heart  disease Maternal Grandfather    Diabetes Paternal Grandmother    Heart disease Paternal Grandfather     Past Surgical History:  Procedure Laterality Date   ABDOMINAL HYSTERECTOMY  1988   CARPAL TUNNEL RELEASE Right    COLONOSCOPY WITH PROPOFOL N/A 09/01/2020   Procedure: COLONOSCOPY WITH PROPOFOL;  Surgeon: Clarene Essex, MD;  Location: [facility];  Service: Endoscopy;  Laterality: N/A;   POLYPECTOMY  09/01/2020   Procedure: POLYPECTOMY;  Surgeon: Clarene Essex, MD;  Location: [facility];  Service: Endoscopy;;   REPLACEMENT TOTAL KNEE Right 2010   TOTAL KNEE ARTHROPLASTY Left 03/07/2017   Procedure: LEFT TOTAL KNEE ARTHROPLASTY;  Surgeon: Meredith Pel, MD;  Location: [facility];  Service: Orthopedics;  Laterality: Left;   Social History   Occupational History   Not on file  Tobacco Use   Smoking status: Never   Smokeless tobacco: Never  Vaping Use   Vaping Use: Never used  Substance and Sexual Activity   Alcohol use: Yes    Comment: occasional   Drug use: No   Sexual activity: Never

## 2021-08-06 ENCOUNTER — Other Ambulatory Visit: Payer: Self-pay | Admitting: Student

## 2021-08-06 DIAGNOSIS — Z1231 Encounter for screening mammogram for malignant neoplasm of breast: Secondary | ICD-10-CM

## 2021-09-09 ENCOUNTER — Other Ambulatory Visit: Payer: Self-pay

## 2021-09-09 ENCOUNTER — Ambulatory Visit
Admission: RE | Admit: 2021-09-09 | Discharge: 2021-09-09 | Disposition: A | Discharge status: Home / Self Care | Payer: Medicare HMO | Source: Ambulatory Visit | Attending: Student | Admitting: Student

## 2021-09-09 DIAGNOSIS — Z1231 Encounter for screening mammogram for malignant neoplasm of breast: Secondary | ICD-10-CM

## 2021-10-22 IMAGING — DX DG CHEST 1V PORT
1 series · 1 of 1 positions shown · non-contrast
Comparison: 02/21/2020

CLINICAL DATA: Shortness of breath. [id number] viral pneumonia.

EXAM:
PORTABLE CHEST 1 VIEW

[chest]
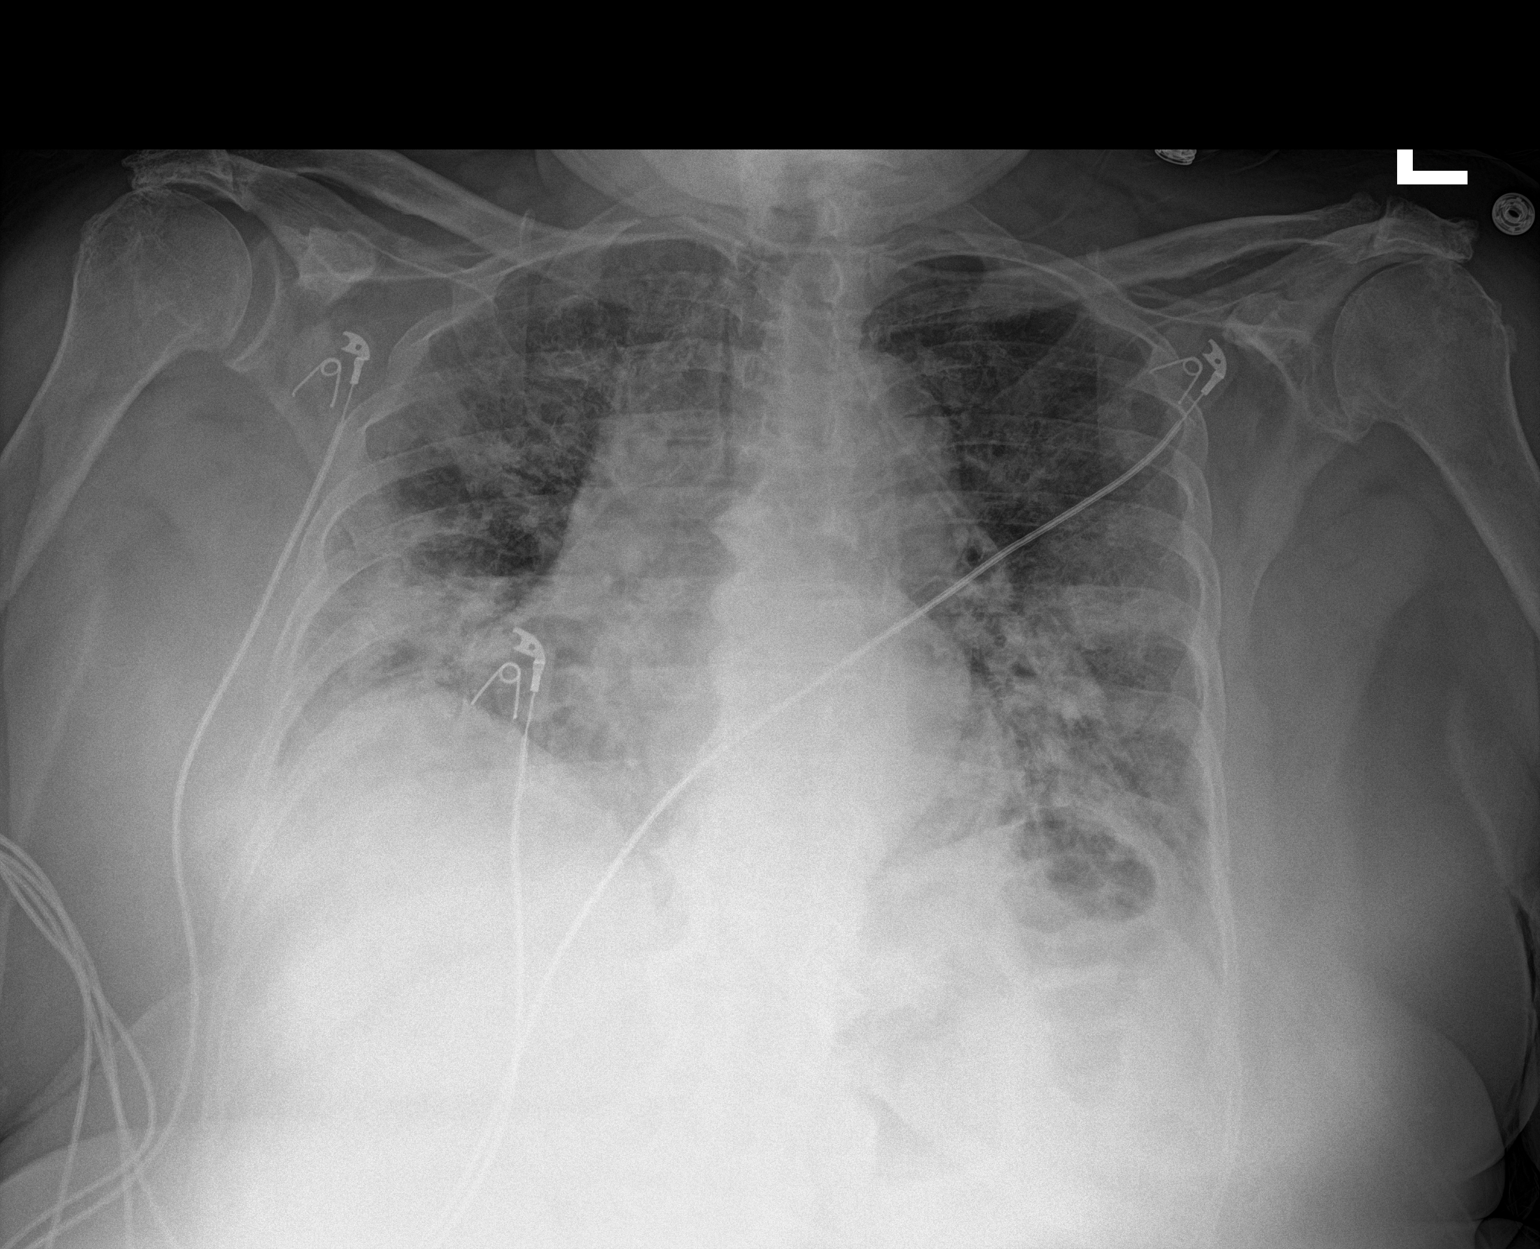

[1 of 1 positions shown; findings below may reference images not displayed]

FINDINGS: Heart size remains within normal limits. Bilateral diffuse
heterogeneous airspace opacity shows no significant change. No
evidence of pneumothorax or pleural effusion.
IMPRESSION: No significant change in diffuse heterogeneous airspace disease.

## 2021-10-26 IMAGING — DX DG CHEST 1V PORT
1 series · 1 of 1 positions shown · non-contrast
Comparison: 11/04/2019

CLINICAL DATA: PICC line placement

EXAM:
PORTABLE CHEST 1 VIEW

[chest ap]
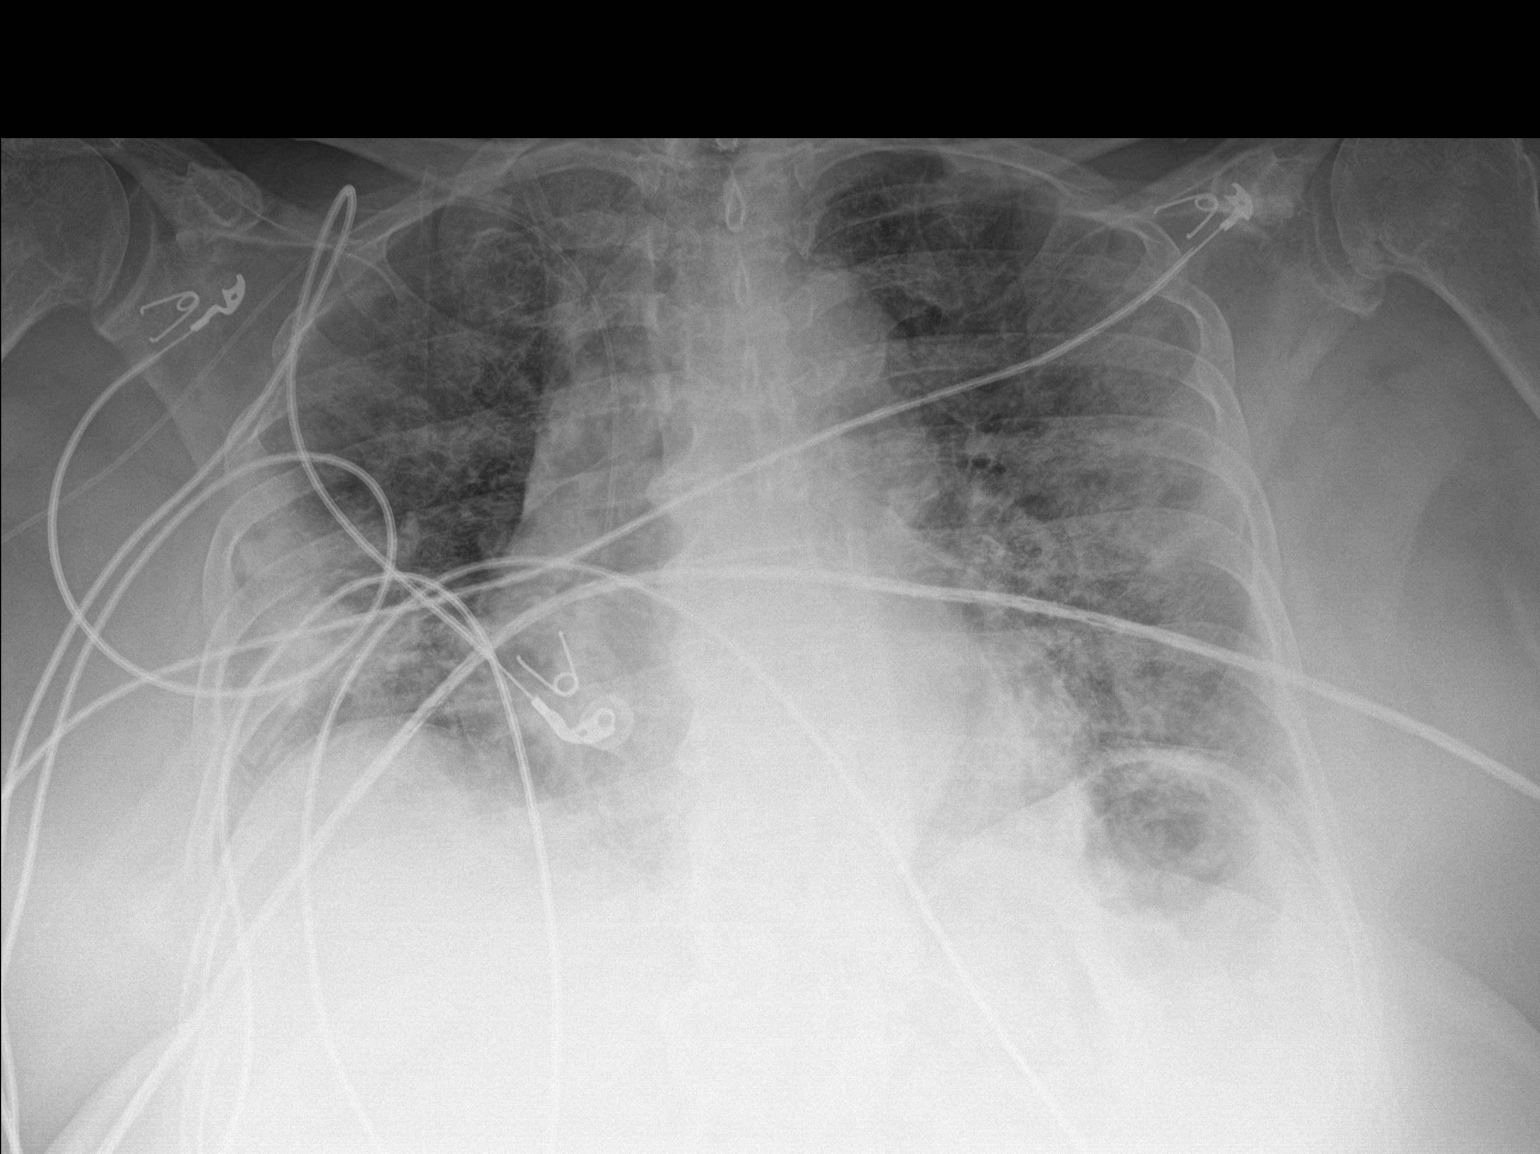

[1 of 1 positions shown; findings below may reference images not displayed]

FINDINGS: Right PICC line is been placed. The tip is at the cavoatrial
junction. Stable diffuse bilateral airspace disease. No visible
effusion or pneumothorax. Subcutaneous emphysema again noted in the
right neck base, stable. No acute bony abnormality.
IMPRESSION: Right PICC line tip at the cavoatrial junction.

Stable bilateral airspace disease.

## 2021-10-27 IMAGING — DX DG CHEST 1V PORT
1 series · 1 of 1 positions shown · non-contrast
Comparison: November 04, 2019 and November 02, 2019

CLINICAL DATA: Shortness of breath

EXAM:
PORTABLE CHEST 1 VIEW

[chest ap]
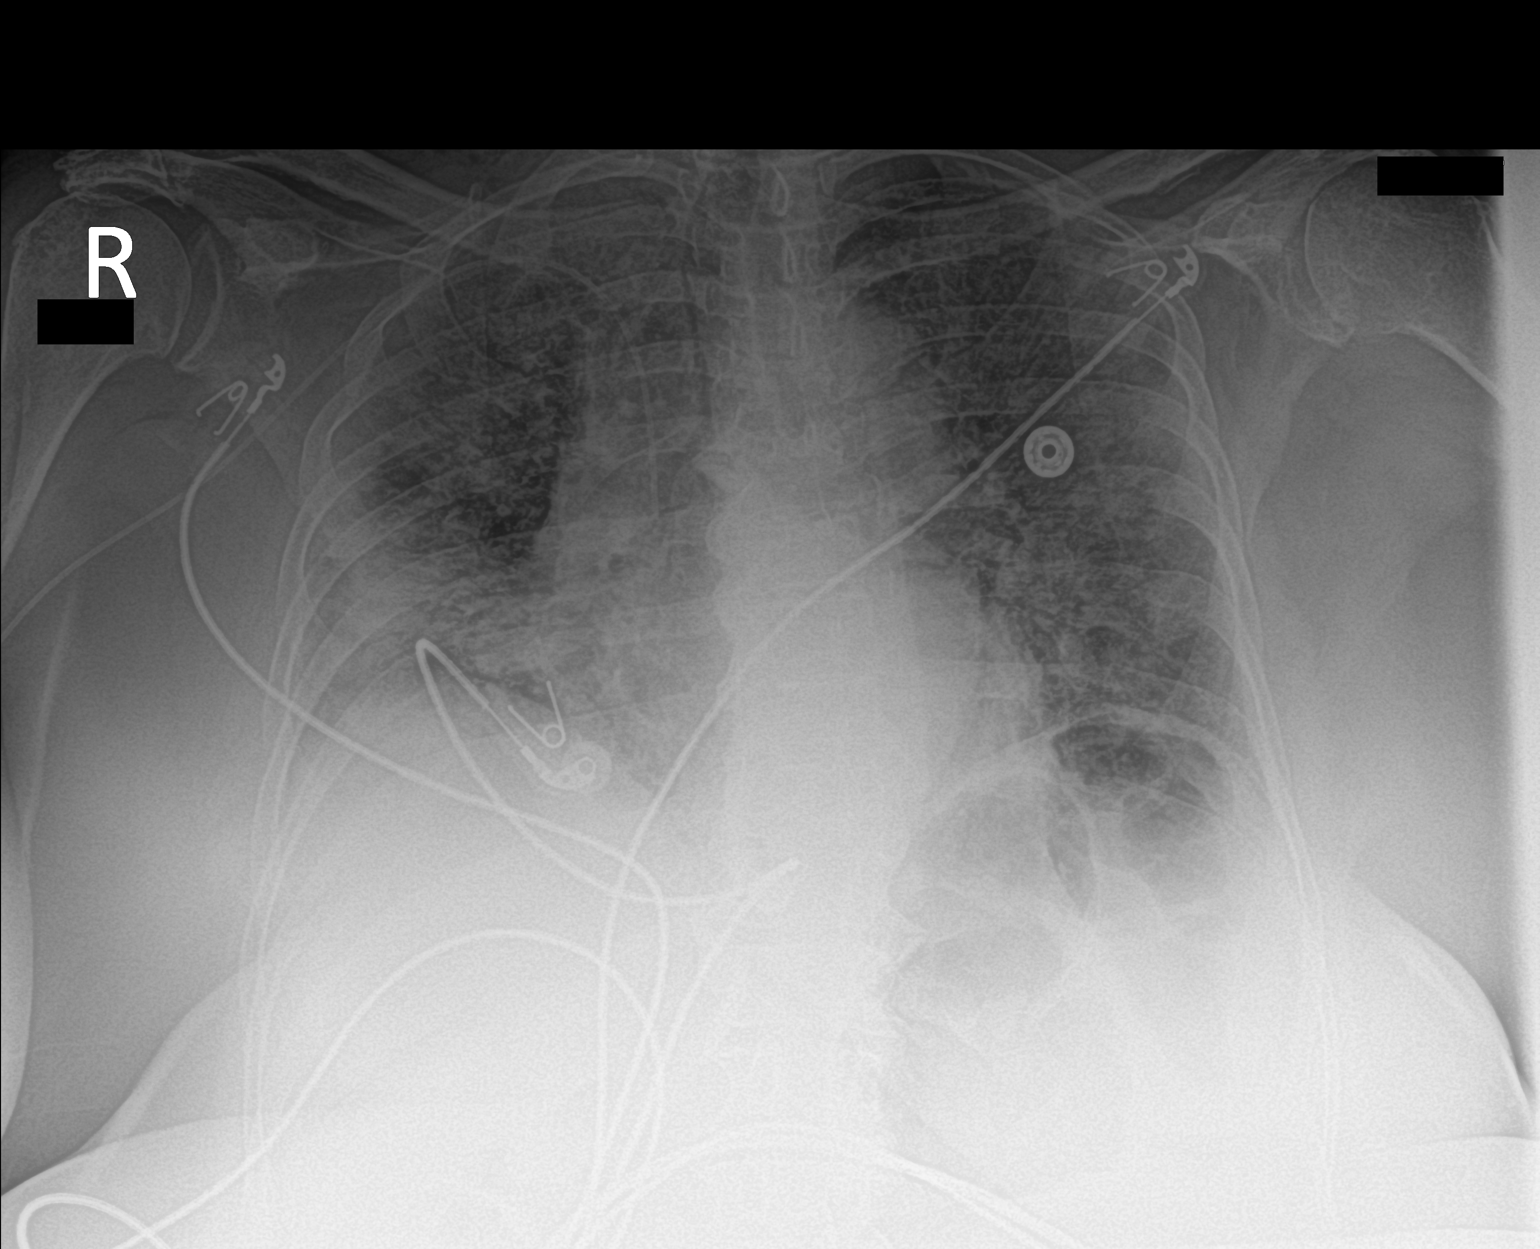

[1 of 1 positions shown; findings below may reference images not displayed]

FINDINGS: Central catheter tip is in the superior vena cava. No pneumothorax.
Note that there is subcutaneous air in the supraclavicular regions,
stable. There is also a degree of pneumomediastinum, less pronounced
than on study from 3 days prior. There is airspace opacity
bilaterally with consolidation in the right lower lobe. No new
opacity evident. Heart size and pulmonary vascularity are normal. No
adenopathy. There is degenerative change in thoracic spine.
IMPRESSION: Multifocal airspace opacity with consolidation right base, similar
to recent studies.

Stable cardiac silhouette. Central catheter tip in superior vena
cava.

No pneumothorax. However, there is pneumomediastinum as well as
subcutaneous air in the supraclavicular regions, more on the right
than the left.

## 2022-02-14 ENCOUNTER — Ambulatory Visit: Payer: Medicare HMO | Admitting: Orthopedic Surgery

## 2022-02-16 ENCOUNTER — Ambulatory Visit: Payer: Medicare HMO | Admitting: Orthopedic Surgery

## 2022-02-16 ENCOUNTER — Encounter: Payer: Self-pay | Admitting: Orthopedic Surgery

## 2022-02-16 DIAGNOSIS — M25562 Pain in left knee: Secondary | ICD-10-CM

## 2022-02-16 DIAGNOSIS — Z96652 Presence of left artificial knee joint: Secondary | ICD-10-CM

## 2022-02-16 DIAGNOSIS — M25561 Pain in right knee: Secondary | ICD-10-CM | POA: Diagnosis not present

## 2022-02-16 NOTE — Progress Notes (Signed)
Office Visit Note   [name]           Date of Birth: [birth year]           MRN: [id number] Visit Date: 02/16/2022 Requested by: Julia Fenton, NP [address] [place],  [place] [address] PCP: Julia Fenton, NP  Subjective: Chief Complaint  Patient presents with   Left Knee - Pain    HPI: Julia Mcfarland is a 76 year old patient with left leg pain.  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  Is on Eliquis for A-fib.  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  She wants to work part-time at [employer].  Husband passed away 2 years ago.  No fevers and chills.              ROS: All systems reviewed are negative as they relate to the chief complaint within the history of present illness.  Patient denies  fevers or chills.   Assessment & Plan: Visit Diagnoses:  1. History of left knee replacement   2. Acute pain of both knees     Plan: Impression is well-functioning bilateral total knee replacements.  Right one is a little bit stiff which is unchanged.  Left one has no instability or patellar instability or laxity.  Radiographs look good bilaterally.  No effusion.  No indication for further work-up for instability or infection.  Episodic symptoms not un expected in this case.  Handicap placard renewed follow-up as needed  Follow-Up Instructions: Return if symptoms worsen or fail to improve.   Orders:  No orders of the defined types were placed in this encounter.  No orders of the defined types were placed in this encounter.     Procedures: No procedures performed   Clinical Data: No additional findings.  Objective: Vital Signs: There were no vitals taken for this visit.  Physical Exam:   Constitutional: Patient appears well-developed HEENT:  Head: Normocephalic Eyes:EOM are normal Neck: Normal range of motion Cardiovascular: Normal rate Pulmonary/chest:  Effort normal Neurologic: Patient is alert Skin: Skin is warm Psychiatric: Patient has normal mood and affect   Ortho Exam: Ortho exam of the right demonstrates range of motion 0-80.  On the left range of motion 0-1 10.  No effusion in either knee.  Patella tracks well bilaterally.  Slight bruise on the left leg from stepping into a table.  Patient is on Eliquis.  Pedal pulses palpable.  No groin pain with internal/external rotation on either side.  No masses lymphadenopathy or skin changes noted in the leg region.  Extensor mechanism intact bilaterally.  Specialty Comments:  No specialty comments available.  Imaging: No results found.   PMFS History: Patient Active Problem List   Diagnosis Date Noted   Persistent atrial fibrillation ([facility]) 10/28/2019   Chronic venous insufficiency 02/18/2019   Prediabetes 08/21/2014   Osteoporosis 08/14/2013   Past Medical History:  Diagnosis Date   Allergy    Arthritis    Chicken pox    Chronic venous insufficiency    a. Uses furosemide prn.   Degenerative joint disease    Morbid obesity ([place])    Osteoarthritis    PONV (postoperative nausea and vomiting)     Family History  Problem Relation Age of Onset   Arthritis Mother    Heart disease Mother    Hypertension Mother    Diabetes Mother  Stroke Father    Hypertension Father    Stroke Sister    Hypertension Sister    Heart disease Maternal Grandfather    Diabetes Paternal Grandmother    Heart disease Paternal Grandfather     Past Surgical History:  Procedure Laterality Date   ABDOMINAL HYSTERECTOMY  1988   CARPAL TUNNEL RELEASE Right    COLONOSCOPY WITH PROPOFOL N/A 09/01/2020   Procedure: COLONOSCOPY WITH PROPOFOL;  Surgeon: Clarene Essex, MD;  Location: [facility];  Service: Endoscopy;  Laterality: N/A;   POLYPECTOMY  09/01/2020   Procedure: POLYPECTOMY;  Surgeon: Clarene Essex, MD;  Location: [facility];  Service: Endoscopy;;   REPLACEMENT TOTAL KNEE Right 2010   TOTAL KNEE  ARTHROPLASTY Left 03/07/2017   Procedure: LEFT TOTAL KNEE ARTHROPLASTY;  Surgeon: Meredith Pel, MD;  Location: [facility];  Service: Orthopedics;  Laterality: Left;   Social History   Occupational History   Not on file  Tobacco Use   Smoking status: Never   Smokeless tobacco: Never  Vaping Use   Vaping Use: Never used  Substance and Sexual Activity   Alcohol use: Yes    Comment: occasional   Drug use: No   Sexual activity: Never

## 2022-02-21 ENCOUNTER — Ambulatory Visit: Payer: Medicare HMO | Admitting: Orthopedic Surgery

## 2022-08-03 ENCOUNTER — Emergency Department (HOSPITAL_BASED_OUTPATIENT_CLINIC_OR_DEPARTMENT_OTHER): Payer: Medicare HMO | Admitting: Radiology

## 2022-08-03 ENCOUNTER — Inpatient Hospital Stay (HOSPITAL_BASED_OUTPATIENT_CLINIC_OR_DEPARTMENT_OTHER)
Admission: EM | Admit: 2022-08-03 | Discharge: 2022-08-06 | DRG: 291 | Disposition: A | Discharge status: Home / Self Care | Payer: Medicare HMO | Attending: Family Medicine | Admitting: Family Medicine

## 2022-08-03 ENCOUNTER — Emergency Department (HOSPITAL_BASED_OUTPATIENT_CLINIC_OR_DEPARTMENT_OTHER): Payer: Medicare HMO

## 2022-08-03 ENCOUNTER — Other Ambulatory Visit: Payer: Self-pay

## 2022-08-03 ENCOUNTER — Encounter (HOSPITAL_BASED_OUTPATIENT_CLINIC_OR_DEPARTMENT_OTHER): Payer: Self-pay | Admitting: Emergency Medicine

## 2022-08-03 DIAGNOSIS — Z6841 Body Mass Index (BMI) 40.0 and over, adult: Secondary | ICD-10-CM

## 2022-08-03 DIAGNOSIS — Z90710 Acquired absence of both cervix and uterus: Secondary | ICD-10-CM

## 2022-08-03 DIAGNOSIS — I509 Heart failure, unspecified: Secondary | ICD-10-CM

## 2022-08-03 DIAGNOSIS — Z7901 Long term (current) use of anticoagulants: Secondary | ICD-10-CM

## 2022-08-03 DIAGNOSIS — I340 Nonrheumatic mitral (valve) insufficiency: Secondary | ICD-10-CM | POA: Diagnosis not present

## 2022-08-03 DIAGNOSIS — I130 Hypertensive heart and chronic kidney disease with heart failure and stage 1 through stage 4 chronic kidney disease, or unspecified chronic kidney disease: Principal | ICD-10-CM | POA: Diagnosis present

## 2022-08-03 DIAGNOSIS — Z79899 Other long term (current) drug therapy: Secondary | ICD-10-CM | POA: Diagnosis not present

## 2022-08-03 DIAGNOSIS — T45516A Underdosing of anticoagulants, initial encounter: Secondary | ICD-10-CM | POA: Diagnosis present

## 2022-08-03 DIAGNOSIS — I361 Nonrheumatic tricuspid (valve) insufficiency: Secondary | ICD-10-CM | POA: Diagnosis not present

## 2022-08-03 DIAGNOSIS — I4819 Other persistent atrial fibrillation: Secondary | ICD-10-CM | POA: Diagnosis present

## 2022-08-03 DIAGNOSIS — Z8249 Family history of ischemic heart disease and other diseases of the circulatory system: Secondary | ICD-10-CM | POA: Diagnosis not present

## 2022-08-03 DIAGNOSIS — M1990 Unspecified osteoarthritis, unspecified site: Secondary | ICD-10-CM | POA: Diagnosis present

## 2022-08-03 DIAGNOSIS — Z8616 Personal history of COVID-19: Secondary | ICD-10-CM | POA: Diagnosis not present

## 2022-08-03 DIAGNOSIS — E1122 Type 2 diabetes mellitus with diabetic chronic kidney disease: Secondary | ICD-10-CM | POA: Diagnosis present

## 2022-08-03 DIAGNOSIS — E876 Hypokalemia: Secondary | ICD-10-CM | POA: Diagnosis present

## 2022-08-03 DIAGNOSIS — Z96653 Presence of artificial knee joint, bilateral: Secondary | ICD-10-CM | POA: Diagnosis present

## 2022-08-03 DIAGNOSIS — Z8261 Family history of arthritis: Secondary | ICD-10-CM | POA: Diagnosis not present

## 2022-08-03 DIAGNOSIS — I4891 Unspecified atrial fibrillation: Secondary | ICD-10-CM

## 2022-08-03 DIAGNOSIS — J9601 Acute respiratory failure with hypoxia: Secondary | ICD-10-CM | POA: Diagnosis not present

## 2022-08-03 DIAGNOSIS — I872 Venous insufficiency (chronic) (peripheral): Secondary | ICD-10-CM | POA: Diagnosis present

## 2022-08-03 DIAGNOSIS — I5032 Chronic diastolic (congestive) heart failure: Secondary | ICD-10-CM | POA: Diagnosis present

## 2022-08-03 DIAGNOSIS — I083 Combined rheumatic disorders of mitral, aortic and tricuspid valves: Secondary | ICD-10-CM | POA: Diagnosis present

## 2022-08-03 DIAGNOSIS — I5033 Acute on chronic diastolic (congestive) heart failure: Secondary | ICD-10-CM | POA: Diagnosis not present

## 2022-08-03 DIAGNOSIS — I425 Other restrictive cardiomyopathy: Secondary | ICD-10-CM | POA: Diagnosis present

## 2022-08-03 DIAGNOSIS — N1831 Chronic kidney disease, stage 3a: Secondary | ICD-10-CM | POA: Diagnosis not present

## 2022-08-03 DIAGNOSIS — I482 Chronic atrial fibrillation, unspecified: Secondary | ICD-10-CM | POA: Diagnosis present

## 2022-08-03 DIAGNOSIS — I5031 Acute diastolic (congestive) heart failure: Secondary | ICD-10-CM | POA: Diagnosis not present

## 2022-08-03 LAB — BASIC METABOLIC PANEL
Anion gap: 9 (ref 5–15)
BUN: 19 mg/dL (ref 8–23)
CO2: 28 mmol/L (ref 22–32)
Calcium: 9.3 mg/dL (ref 8.9–10.3)
Chloride: 110 mmol/L (ref 98–111)
Creatinine, Ser: 1.06 mg/dL — ABNORMAL HIGH (ref 0.44–1.00)
GFR, Estimated: 55 mL/min — ABNORMAL LOW (ref >60)
Glucose, Bld: 142 mg/dL — ABNORMAL HIGH (ref 70–99)
Potassium: 3.6 mmol/L (ref 3.5–5.1)
Sodium: 147 mmol/L — ABNORMAL HIGH (ref 135–145)

## 2022-08-03 LAB — CBC WITH DIFFERENTIAL/PLATELET
Abs Immature Granulocytes: 0.02 10*3/uL (ref 0.00–0.07)
Basophils Absolute: 0.1 10*3/uL (ref 0.0–0.1)
Basophils Relative: 1 %
Eosinophils Absolute: 0.1 10*3/uL (ref 0.0–0.5)
Eosinophils Relative: 2 %
HCT: 40.7 % (ref 36.0–46.0)
Hemoglobin: 13.2 g/dL (ref 12.0–15.0)
Immature Granulocytes: 0 %
Lymphocytes Relative: 26 %
Lymphs Abs: 1.2 10*3/uL (ref 0.7–4.0)
MCH: 30.9 pg (ref 26.0–34.0)
MCHC: 32.4 g/dL (ref 30.0–36.0)
MCV: 95.3 fL (ref 80.0–100.0)
Monocytes Absolute: 0.7 10*3/uL (ref 0.1–1.0)
Monocytes Relative: 14 %
Neutro Abs: 2.8 10*3/uL (ref 1.7–7.7)
Neutrophils Relative %: 57 %
Platelets: 230 10*3/uL (ref 150–400)
RBC: 4.27 MIL/uL (ref 3.87–5.11)
RDW: 14 % (ref 11.5–15.5)
WBC: 4.9 10*3/uL (ref 4.0–10.5)
nRBC: 0 % (ref 0.0–0.2)

## 2022-08-03 LAB — TROPONIN I (HIGH SENSITIVITY)
Troponin I (High Sensitivity): 5 ng/L (ref <18)
Troponin I (High Sensitivity): 4 ng/L (ref <18)

## 2022-08-03 LAB — MAGNESIUM: Magnesium: 2 mg/dL (ref 1.7–2.4)

## 2022-08-03 LAB — BRAIN NATRIURETIC PEPTIDE: B Natriuretic Peptide: 284.4 pg/mL — ABNORMAL HIGH (ref 0.0–100.0)

## 2022-08-03 LAB — TSH: TSH: 0.778 u[IU]/mL (ref 0.350–4.500)

## 2022-08-03 MED ORDER — ALBUTEROL SULFATE (2.5 MG/3ML) 0.083% IN NEBU: 2.5000 mg | INHALATION_SOLUTION | RESPIRATORY_TRACT | Status: DC | PRN

## 2022-08-03 MED ORDER — METOPROLOL SUCCINATE ER 25 MG PO TB24
25.0000 mg | ORAL_TABLET | Freq: Every day | ORAL | Status: DC
  Administered 2022-08-04 (×2): 25 mg via ORAL
  Filled 2022-08-03 (×2): qty 1

## 2022-08-03 MED ORDER — FUROSEMIDE 10 MG/ML IJ SOLN
40.0000 mg | Freq: Two times a day (BID) | INTRAMUSCULAR | Status: DC
  Administered 2022-08-04 – 2022-08-05 (×5): 40 mg via INTRAVENOUS
  Filled 2022-08-03 (×5): qty 4

## 2022-08-03 MED ORDER — APIXABAN 5 MG PO TABS
5.0000 mg | ORAL_TABLET | Freq: Two times a day (BID) | ORAL | Status: DC
  Administered 2022-08-03 – 2022-08-06 (×6): 5 mg via ORAL
  Filled 2022-08-03 (×4): qty 1
  Filled 2022-08-03: qty 2
  Filled 2022-08-03: qty 1

## 2022-08-03 MED ORDER — DILTIAZEM HCL 25 MG/5ML IV SOLN
20.0000 mg | Freq: Once | INTRAVENOUS | Status: AC
  Administered 2022-08-03: 20 mg via INTRAVENOUS
  Filled 2022-08-03: qty 5

## 2022-08-03 MED ORDER — DILTIAZEM HCL-DEXTROSE 125-5 MG/125ML-% IV SOLN (PREMIX)
5.0000 mg/h | INTRAVENOUS | Status: DC
  Administered 2022-08-03: 5 mg/h via INTRAVENOUS
  Administered 2022-08-04: 15 mg/h via INTRAVENOUS
  Administered 2022-08-04: 5 mg/h via INTRAVENOUS
  Filled 2022-08-03 (×3): qty 125

## 2022-08-03 MED ORDER — ACETAMINOPHEN 325 MG PO TABS: 650.0000 mg | ORAL_TABLET | Freq: Four times a day (QID) | ORAL | Status: DC | PRN

## 2022-08-03 MED ORDER — ALBUTEROL SULFATE HFA 108 (90 BASE) MCG/ACT IN AERS
2.0000 | INHALATION_SPRAY | RESPIRATORY_TRACT | Status: DC | PRN
  Administered 2022-08-03: 2 via RESPIRATORY_TRACT
  Filled 2022-08-03: qty 6.7

## 2022-08-03 MED ORDER — FUROSEMIDE 10 MG/ML IJ SOLN: 40.0000 mg | Freq: Two times a day (BID) | INTRAMUSCULAR | Status: DC

## 2022-08-03 MED ORDER — ALBUTEROL SULFATE (2.5 MG/3ML) 0.083% IN NEBU: 3.0000 mL | INHALATION_SOLUTION | Freq: Four times a day (QID) | RESPIRATORY_TRACT | Status: DC | PRN

## 2022-08-03 MED ORDER — SODIUM CHLORIDE 0.9% FLUSH
3.0000 mL | Freq: Two times a day (BID) | INTRAVENOUS | Status: DC
  Administered 2022-08-04 – 2022-08-05 (×4): 3 mL via INTRAVENOUS

## 2022-08-03 MED ORDER — ACETAMINOPHEN 650 MG RE SUPP: 650.0000 mg | Freq: Four times a day (QID) | RECTAL | Status: DC | PRN

## 2022-08-03 MED ORDER — SENNOSIDES-DOCUSATE SODIUM 8.6-50 MG PO TABS: 1.0000 | ORAL_TABLET | Freq: Every evening | ORAL | Status: DC | PRN

## 2022-08-03 NOTE — ED Notes (Signed)
Pt has been trending up to as high as 150, rate dose change to 10

## 2022-08-03 NOTE — ED Provider Notes (Signed)
  Physical Exam  BP 126/83 (BP Location: Left Arm)   Pulse (!) 150   Temp 98.5 F (36.9 C) (Oral)   Resp 19   Ht '5\' 2"'$  (1.575 m)   Wt 118.8 kg   SpO2 100%   BMI 47.90 kg/m     Procedures  Procedures  ED Course / MDM    Medical Decision Making Amount and/or Complexity of Data Reviewed Radiology: ordered.  Risk Prescription drug management. Decision regarding hospitalization.   69F presenting with concern for afib with RVR and concern for CHF. Unclear if she has been fully compliant with her AC. On Cardizem and resuming her home AC, last dose of AC yesterday. Likely admission. BNP resulted mildly elevated at 284. Troponin is 5.  The patient admits to intermittent compliance with his Eliquis over the last 3 days.  She is not safe for immediate cardioversion in the emergency department.  She remains in atrial fibrillation with RVR with heart rates in the 120s.  I explained to the patient that it would not be safe to cardiovert her given her risk for stroke and noncompliance with her anticoagulation, I also explained that it would not be safe to send her home currently with heart rates uncontrolled.  The patient does endorse some unilateral swelling left greater than right and in the setting of intermittent compliance with her anticoagulation, will undertake DVT study of the left lower extremity.  I recommended admission for observation, continued anticoagulation and rate control.  Hospitalist medicine consulted for admission.       Regan Lemming, MD 08/03/22 [phone]

## 2022-08-03 NOTE — Plan of Care (Signed)
Plan of Care Note for accepted transfer  [name]    UGQ:[id number]  DOA: 08/03/2022     Nursing staff, Please call [facility] number on Amion as soon as patient's arrival to the unit (not the listed attending) so that the appropriate admitting provider can evaluate the pt. ASAP to avoid any delay in care.  Facility requesting transfer: ED at [facility] Requesting Provider: Dr. Armandina Gemma Reason for transfer: [place] course:   77 y.o with hx of Afib, obesity, chronic venous insufficiency, came to ED for SOB and b/l LE swelling. Per ED provider patient was non-compliant with eliquis so did not attempt cardioversion. HR in 150s so started on diltiazem drip. She is not hypoxic. Troponin normal, elevated BNP. Request transfer for HLOC for Afib management.   Plan of care: The patient is accepted for admission to Telemetry unit, at [facility] stopping diltiazem drip for PO metoprolol Repeat echocardiogram  Author: Lorelei Pont, MD  08/03/2022  Check [email or web address] for on-call coverage.

## 2022-08-03 NOTE — ED Notes (Signed)
US at bedside

## 2022-08-03 NOTE — ED Provider Notes (Signed)
Welcome Provider Note   CSN: [id number] Arrival date & time: 08/03/22  1340     History  Chief Complaint  Patient presents with   Shortness of Breath    Julia Mcfarland is a 77 y.o. female.  HPI   77 year old female with past medical history of atrial fibrillation on anticoagulation, arthritis presents to the emergency department with concern for fatigue, shortness of breath and leg swelling.  Patient states that she has been compliant with her medications however "sometimes misses doses".  She states over the past couple days the swelling in her legs has been worsening.  She feels short of breath with some mild chest heaviness.  She endorses a nonproductive cough but denies any fever, vomiting or diarrhea.  Home Medications Prior to Admission medications   Medication Sig Start Date End Date Taking? Authorizing Provider  albuterol (VENTOLIN HFA) 108 (90 Base) MCG/ACT inhaler INHALE 2 PUFFS INTO LUNGS EVERY 6 HOURS AS NEEDED FOR WHEEZING OR SHORTNESS OF BREATH 11/28/20   Dutch Quint B, FNP  apixaban (ELIQUIS) 5 MG TABS tablet Take 1 tablet (5 mg total) by mouth 2 (two) times daily. 03/13/20   Croitoru, Mihai, MD  Ascorbic Acid (VITAMIN C) 1000 MG tablet Take 1,000 mg by mouth daily.    [provider]  Cholecalciferol (VITAMIN D3) 5000 UNITS CAPS Take 5,000 Units by mouth daily.     [provider]  furosemide (LASIX) 40 MG tablet Take 1.5 tablets (60 mg total) by mouth daily. 02/18/20   Kroeger, Lorelee Cover., PA-C  hydroxypropyl methylcellulose / hypromellose (ISOPTO TEARS / GONIOVISC) 2.5 % ophthalmic solution Place 1 drop into both eyes daily as needed for dry eyes.    [provider]  metoprolol succinate (TOPROL-XL) 25 MG 24 hr tablet TAKE 1 TABLET BY MOUTH EVERY DAY WITH OR IMMEDIATELY FOLLOWING A MEAL Patient taking differently: Take 25 mg by mouth daily. 06/08/20   Croitoru, Mihai, MD  vitamin B-12  (CYANOCOBALAMIN) 500 MCG tablet Take 500 mcg by mouth daily.    [provider]      Allergies    Patient has no known allergies.    Review of Systems   Review of Systems  Constitutional:  Positive for fatigue. Negative for fever.  Respiratory:  Positive for cough, chest tightness and shortness of breath.   Cardiovascular:  Positive for leg swelling. Negative for chest pain.  Gastrointestinal:  Negative for abdominal pain, diarrhea and vomiting.  Skin:  Negative for rash.  Neurological:  Negative for headaches.    Physical Exam Updated Vital Signs BP 126/83 (BP Location: Left Arm)   Pulse (!) 150   Temp 98.5 F (36.9 C) (Oral)   Resp 19   Ht '5\' 2"'$  (1.575 m)   Wt 118.8 kg   SpO2 100%   BMI 47.90 kg/m  Physical Exam Vitals and nursing note reviewed.  Constitutional:      Appearance: Normal appearance. She is obese.  HENT:     Head: Normocephalic.     Mouth/Throat:     Mouth: Mucous membranes are moist.  Cardiovascular:     Rate and Rhythm: Normal rate.  Pulmonary:     Effort: Pulmonary effort is normal. No respiratory distress.     Breath sounds: Rales present.  Abdominal:     Palpations: Abdomen is soft.     Tenderness: There is no abdominal tenderness.  Musculoskeletal:     Right lower leg: Edema present.  Left lower leg: Edema present.  Skin:    General: Skin is warm.  Neurological:     Mental Status: She is alert and oriented to person, place, and time. Mental status is at baseline.  Psychiatric:        Mood and Affect: Mood normal.     ED Results / Procedures / Treatments   Labs (all labs ordered are listed, but only abnormal results are displayed) Labs Reviewed  BASIC METABOLIC PANEL - Abnormal; Notable for the following components:      Result Value   Sodium 147 (*)    Glucose, Bld 142 (*)    Creatinine, Ser 1.06 (*)    GFR, Estimated 55 (*)    All other components within normal limits  MAGNESIUM  CBC WITH DIFFERENTIAL/PLATELET   TSH  BRAIN NATRIURETIC PEPTIDE  TROPONIN I (HIGH SENSITIVITY)    EKG None  Radiology DG Chest Port 1 View  Result Date: 08/03/2022 CLINICAL DATA:  Shortness of breath. EXAM: PORTABLE CHEST 1 VIEW COMPARISON:  11/25/2019 FINDINGS: Cardiac enlargement. Blunting of the right costophrenic angle. Increase interstitial markings are noted bilaterally. No airspace opacities. Visualized osseous structures appear intact. IMPRESSION: 1. Suspect mild congestive heart failure. 2. Small right pleural effusion. Electronically Signed   By: Kerby Moors M.D.   On: 08/03/2022 14:33    Procedures .Critical Care  Performed by: Lorelle Gibbs, DO Authorized by: Lorelle Gibbs, DO   Critical care provider statement:    Critical care time (minutes):  30   Critical care was necessary to treat or prevent imminent or life-threatening deterioration of the following conditions:  Cardiac failure   Critical care was time spent personally by me on the following activities:  Development of treatment plan with patient or surrogate, discussions with consultants, evaluation of patient's response to treatment, examination of patient, ordering and review of laboratory studies, ordering and review of radiographic studies, ordering and performing treatments and interventions, pulse oximetry, re-evaluation of patient's condition and review of old charts   I assumed direction of critical care for this patient from another provider in my specialty: no       Medications Ordered in ED Medications  albuterol (VENTOLIN HFA) 108 (90 Base) MCG/ACT inhaler 2 puff (has no administration in time range)  diltiazem (CARDIZEM) injection 20 mg (20 mg Intravenous Given 08/03/22 1445)    ED Course/ Medical Decision Making/ A&P                             Medical Decision Making Amount and/or Complexity of Data Reviewed Radiology: ordered.  Risk Prescription drug management.   77 year old female presents emergency  department with fatigue, shortness of breath and leg swelling.  Sounds like intermittent compliance with medications including anticoagulation.  No hypoxia.  Patient has rales bilaterally and pitting edema.  EKG shows atrial fibrillation with rates in the 150s, stable blood pressure.  Patient will be given a dose of Cardizem, initial evaluation and started, concern for CHF.  Last echo was back in 2021 with normal EF.  Patient given 1 dose of Cardizem with improvement of rate however will be transition to Cardizem drip.  Patient signed out pending lab results and further evaluation.  Anticipate admission for A-fib with RVR and possible new onset CHF.        Final Clinical Impression(s) / ED Diagnoses Final diagnoses:  None    Rx / DC Orders ED Discharge Orders  None         Lorelle Gibbs, DO 08/03/22 1459

## 2022-08-03 NOTE — ED Notes (Signed)
ED Provider at bedside. 

## 2022-08-03 NOTE — Progress Notes (Signed)
   08/03/22 2154  Assess: MEWS Score  Temp 98.4 F (36.9 C)  BP (!) 131/92  MAP (mmHg) 106  Pulse Rate (!) 45  ECG Heart Rate (!) 115  Resp (!) 24  SpO2 98 %  O2 Device Room Air  Assess: MEWS Score  MEWS Temp 0  MEWS Systolic 0  MEWS Pulse 2  MEWS RR 1  MEWS LOC 0  MEWS Score 3  MEWS Score Color Yellow  Assess: if the MEWS score is Yellow or Red  Were vital signs taken at a resting state? Yes  Focused Assessment No change from prior assessment  Does the patient meet 2 or more of the SIRS criteria? No  MEWS guidelines implemented *See Row Information* Yes  Treat  MEWS Interventions Other (Comment);Escalated (See documentation below)  Pain Scale 0-10  Pain Score 0  Take Vital Signs  Increase Vital Sign Frequency  Yellow: Q 2hr X 2 then Q 4hr X 2, if remains yellow, continue Q 4hrs  Notify: Charge Nurse/RN  Name of Charge Nurse/RN Notified Shannel RN  Date Charge Nurse/RN Notified 08/03/22  Time Charge Nurse/RN Notified 2200  Assess: SIRS CRITERIA  SIRS Temperature  0  SIRS Pulse 1  SIRS Respirations  1  SIRS WBC 0  SIRS Score Sum  2

## 2022-08-03 NOTE — H&P (Incomplete)
History and Physical    OYINDAMOLA MUNTER ZOX:[id number] DOB: 08/26/1945 DOA: 08/03/2022  PCP: Hillery Aldo, NP   Patient coming from: Home   Chief Complaint: SOB, leg swelling, palpitations   HPI: Julia Mcfarland is a pleasant 77 y.o. female with medical history significant for atrial fibrillation on Eliquis, chronic diastolic CHF, CKD IIIa, and BMI 48, now presenting to the emergency department for evaluation of shortness of breath, leg swelling, and palpitations.  Patient has not been using Lasix recently and does not take metoprolol or Eliquis consistently.  She began to notice increased leg swelling, particularly on the left, over the past week, and then had insidious onset of dyspnea which has worsened significantly in the past 1 to 2 days.  She has also had palpitations for the past 1 to 2 days.  She has been waking up at night due to palpitations.  She denies cough, chest pain, fever, or chills.  [facility] ED Course: Upon arrival to the ED, patient is found to be afebrile and saturating well on room air with heart rate 150 and elevated blood pressure.  EKG demonstrates rapid atrial fibrillation with rate 148.  Chest x-ray features of mild CHF changes.  Venous ultrasound of the left lower extremity is negative for DVT.  Blood work is notable for sodium 147, creatinine 1.06, normal troponin x 2, normal TSH, and BNP 284.  Patient was given Eliquis in the emergency department, 20 mg IV diltiazem, and was started on diltiazem infusion.  She was transferred to [facility] for admission.  Review of Systems:  All other systems reviewed and apart from HPI, are negative.  Past Medical History:  Diagnosis Date   Allergy    Arthritis    Chicken pox    Chronic venous insufficiency    a. Uses furosemide prn.   Degenerative joint disease    Morbid obesity (HCC)    Osteoarthritis    PONV (postoperative nausea and vomiting)     Past Surgical History:  Procedure Laterality  Date   ABDOMINAL HYSTERECTOMY  1988   CARPAL TUNNEL RELEASE Right    COLONOSCOPY WITH PROPOFOL N/A 09/01/2020   Procedure: COLONOSCOPY WITH PROPOFOL;  Surgeon: Vida Rigger, MD;  Location: [facility] ENDOSCOPY;  Service: Endoscopy;  Laterality: N/A;   POLYPECTOMY  09/01/2020   Procedure: POLYPECTOMY;  Surgeon: Vida Rigger, MD;  Location: [facility] ENDOSCOPY;  Service: Endoscopy;;   REPLACEMENT TOTAL KNEE Right 2010   TOTAL KNEE ARTHROPLASTY Left 03/07/2017   Procedure: LEFT TOTAL KNEE ARTHROPLASTY;  Surgeon: Cammy Copa, MD;  Location: [facility] OR;  Service: Orthopedics;  Laterality: Left;    Social History:   reports that she has never smoked. She has never used smokeless tobacco. She reports current alcohol use. She reports that she does not use drugs.  No Known Allergies  Family History  Problem Relation Age of Onset   Arthritis Mother    Heart disease Mother    Hypertension Mother    Diabetes Mother    Stroke Father    Hypertension Father    Stroke Sister    Hypertension Sister    Heart disease Maternal Grandfather    Diabetes Paternal Grandmother    Heart disease Paternal Grandfather      Prior to Admission medications   Medication Sig Start Date End Date Taking? Authorizing Provider  albuterol (VENTOLIN HFA) 108 (90 Base) MCG/ACT inhaler INHALE 2 PUFFS INTO LUNGS EVERY 6 HOURS AS NEEDED FOR WHEEZING OR SHORTNESS OF BREATH 11/28/20  Eulis Foster, FNP  apixaban (ELIQUIS) 5 MG TABS tablet Take 1 tablet (5 mg total) by mouth 2 (two) times daily. 03/13/20   Croitoru, Mihai, MD  Ascorbic Acid (VITAMIN C) 1000 MG tablet Take 1,000 mg by mouth daily.    [provider]  Cholecalciferol (VITAMIN D3) 5000 UNITS CAPS Take 5,000 Units by mouth daily.     [provider]  furosemide (LASIX) 40 MG tablet Take 1.5 tablets (60 mg total) by mouth daily. 02/18/20   Kroeger, Ovidio Kin., PA-C  hydroxypropyl methylcellulose / hypromellose (ISOPTO TEARS / GONIOVISC) 2.5 % ophthalmic solution  Place 1 drop into both eyes daily as needed for dry eyes.    [provider]  metoprolol succinate (TOPROL-XL) 25 MG 24 hr tablet TAKE 1 TABLET BY MOUTH EVERY DAY WITH OR IMMEDIATELY FOLLOWING A MEAL Patient taking differently: Take 25 mg by mouth daily. 06/08/20   Croitoru, Mihai, MD  vitamin B-12 (CYANOCOBALAMIN) 500 MCG tablet Take 500 mcg by mouth daily.    [provider]    Physical Exam: Vitals:   08/03/22 2000 08/03/22 2045 08/03/22 2100 08/03/22 2154  BP: 119/87 (!) 122/98 118/89 (!) 131/92  Pulse: (!) 109 95 78 (!) 45  Resp: (!) 24 (!) 24 (!) 25 (!) 24  Temp: 98 F (36.7 C)  98 F (36.7 C) 98.4 F (36.9 C)  TempSrc:   Oral Oral  SpO2: 100% 99% 99% 98%  Weight:      Height:         Constitutional: NAD, calm  Eyes: PERTLA, lids and conjunctivae normal ENMT: Mucous membranes are moist. Posterior pharynx clear of any exudate or lesions.   Neck: supple, no masses  Respiratory: Speaking full sentences, no wheezing. No accessory muscle use.  Cardiovascular: S1 & S2 heard, regular rate and rhythm. Bilateral LE edema.   Abdomen: No distension, no tenderness, soft. Bowel sounds active.  Musculoskeletal: no clubbing / cyanosis. No joint deformity upper and lower extremities.   Skin: no significant rashes, lesions, ulcers. Warm, dry, well-perfused. Neurologic: CN 2-12 grossly intact. Moving all extremities. Alert and oriented.  Psychiatric: Pleasant. Cooperative.    Labs and Imaging on Admission: I have personally reviewed following labs and imaging studies  CBC: Recent Labs  Lab 08/03/22 1408  WBC 4.9  NEUTROABS 2.8  HGB 13.2  HCT 40.7  MCV 95.3  PLT 230   Basic Metabolic Panel: Recent Labs  Lab 08/03/22 1408  NA 147*  K 3.6  CL 110  CO2 28  GLUCOSE 142*  BUN 19  CREATININE 1.06*  CALCIUM 9.3  MG 2.0   GFR: Estimated Creatinine Clearance: 56.2 mL/min (A) (by C-G formula based on SCr of 1.06 mg/dL (H)). Liver Function Tests: No results  for input(s): "AST", "ALT", "ALKPHOS", "BILITOT", "PROT", "ALBUMIN" in the last 168 hours. No results for input(s): "LIPASE", "AMYLASE" in the last 168 hours. No results for input(s): "AMMONIA" in the last 168 hours. Coagulation Profile: No results for input(s): "INR", "PROTIME" in the last 168 hours. Cardiac Enzymes: No results for input(s): "CKTOTAL", "CKMB", "CKMBINDEX", "TROPONINI" in the last 168 hours. BNP (last 3 results) No results for input(s): "PROBNP" in the last 8760 hours. HbA1C: No results for input(s): "HGBA1C" in the last 72 hours. CBG: No results for input(s): "GLUCAP" in the last 168 hours. Lipid Profile: No results for input(s): "CHOL", "HDL", "LDLCALC", "TRIG", "CHOLHDL", "LDLDIRECT" in the last 72 hours. Thyroid Function Tests: Recent Labs    08/03/22 1408  TSH  0.778   Anemia Panel: No results for input(s): "VITAMINB12", "FOLATE", "FERRITIN", "TIBC", "IRON", "RETICCTPCT" in the last 72 hours. Urine analysis:    Component Value Date/Time   COLORURINE YELLOW 02/28/2017 1154   APPEARANCEUR CLEAR 02/28/2017 1154   LABSPEC 1.010 02/28/2017 1154   PHURINE 6.0 02/28/2017 1154   GLUCOSEU NEGATIVE 02/28/2017 1154   HGBUR NEGATIVE 02/28/2017 1154   BILIRUBINUR neg 07/27/2018 1426   KETONESUR NEGATIVE 02/28/2017 1154   PROTEINUR Negative 07/27/2018 1426   PROTEINUR NEGATIVE 02/28/2017 1154   UROBILINOGEN 0.2 07/27/2018 1426   UROBILINOGEN 1.0 08/21/2008 1407   NITRITE neg 07/27/2018 1426   NITRITE NEGATIVE 02/28/2017 1154   LEUKOCYTESUR Negative 07/27/2018 1426   Sepsis Labs: @LABRCNTIP (procalcitonin:4,lacticidven:4) )No results found for this or any previous visit (from the past 240 hour(s)).   Radiological Exams on Admission: US Venous Img Lower Unilateral Left  Result Date: 08/03/2022 CLINICAL DATA:  Left lower extremity pain and edema for the past 2 days. Evaluate for DVT. EXAM: LEFT LOWER EXTREMITY VENOUS DOPPLER ULTRASOUND TECHNIQUE: Gray-scale  sonography with graded compression, as well as color Doppler and duplex ultrasound were performed to evaluate the lower extremity deep venous systems from the level of the common femoral vein and including the common femoral, femoral, profunda femoral, popliteal and calf veins including the posterior tibial, peroneal and gastrocnemius veins when visible. The superficial great saphenous vein was also interrogated. Spectral Doppler was utilized to evaluate flow at rest and with distal augmentation maneuvers in the common femoral, femoral and popliteal veins. COMPARISON:  None Available. FINDINGS: Contralateral Common Femoral Vein: Respiratory phasicity is normal and symmetric with the symptomatic side. No evidence of thrombus. Normal compressibility. Common Femoral Vein: No evidence of thrombus. Normal compressibility, respiratory phasicity and response to augmentation. Saphenofemoral Junction: No evidence of thrombus. Normal compressibility and flow on color Doppler imaging. Profunda Femoral Vein: No evidence of thrombus. Normal compressibility and flow on color Doppler imaging. Femoral Vein: No evidence of thrombus. Normal compressibility, respiratory phasicity and response to augmentation. Popliteal Vein: No evidence of thrombus. Normal compressibility, respiratory phasicity and response to augmentation. Calf Veins: No evidence of thrombus. Normal compressibility and flow on color Doppler imaging. Superficial Great Saphenous Vein: No evidence of thrombus. Normal compressibility. Other Findings:  None. IMPRESSION: No evidence of DVT within the left lower extremity. Electronically Signed   By: Simonne Come M.D.   On: 08/03/2022 17:18   DG Chest Port 1 View  Result Date: 08/03/2022 CLINICAL DATA:  Shortness of breath. EXAM: PORTABLE CHEST 1 VIEW COMPARISON:  11/25/2019 FINDINGS: Cardiac enlargement. Blunting of the right costophrenic angle. Increase interstitial markings are noted bilaterally. No airspace  opacities. Visualized osseous structures appear intact. IMPRESSION: 1. Suspect mild congestive heart failure. 2. Small right pleural effusion. Electronically Signed   By: Signa Kell M.D.   On: 08/03/2022 14:33    EKG: Independently reviewed. Atrial fibrillation with RVR, rate 148.   Assessment/Plan   1. Atrial fibrillation with RVR  - Presents with SOB, increased leg swelling, and palpitation and is found to be in rapid a fib with rate as high as 150s   - Not cardioverted in ED d/t inconsistent Eliquis use  - She has not been taking Toprol or Eliquis consistently  - Resume Toprol with dose now, titrate off diltiazem infusion as able, continue Eliquis    2. Acute on chronic diastolic CHF  - Appears hypervolemic on admission with elevated BNP and mild CHF findings on CXR  - EF was preserved on  TTE in September 2021  - She is prescribed Lasix but has not been taking lately, rapid HR may also be contributing to acute CHF  - Diurese with 40 mg IV Lasix q12h, update echo, monitor wt and I/Os    3. CKD 3a  - Appears to be at baseline  - Renally-dose medications, monitor     DVT prophylaxis: Eliquis  Code Status: Full  Level of Care: Level of care: Telemetry Cardiac Family Communication: none present  Disposition Plan:  Patient is from: home  Anticipated d/c is to: Home  Anticipated d/c date is: 08/06/22  Patient currently: Pending rate-control, echo  Consults called: none  Admission status: Inpatient     Briscoe Deutscher, MD [facility]  08/04/2022, 12:04 AM

## 2022-08-03 NOTE — ED Triage Notes (Signed)
Pt via pov from home with sob and leg swelling x 2 days. She reports that the leg swelling is a new problem. Pt has persistent afib. Pt alert & oriented, nad noted.

## 2022-08-04 ENCOUNTER — Inpatient Hospital Stay (HOSPITAL_COMMUNITY): Payer: Medicare HMO

## 2022-08-04 DIAGNOSIS — I340 Nonrheumatic mitral (valve) insufficiency: Secondary | ICD-10-CM

## 2022-08-04 DIAGNOSIS — I5031 Acute diastolic (congestive) heart failure: Secondary | ICD-10-CM

## 2022-08-04 DIAGNOSIS — I4891 Unspecified atrial fibrillation: Secondary | ICD-10-CM

## 2022-08-04 DIAGNOSIS — I361 Nonrheumatic tricuspid (valve) insufficiency: Secondary | ICD-10-CM | POA: Diagnosis not present

## 2022-08-04 LAB — BASIC METABOLIC PANEL
Anion gap: 9 (ref 5–15)
BUN: 12 mg/dL (ref 8–23)
CO2: 25 mmol/L (ref 22–32)
Calcium: 8.6 mg/dL — ABNORMAL LOW (ref 8.9–10.3)
Chloride: 108 mmol/L (ref 98–111)
Creatinine, Ser: 0.92 mg/dL (ref 0.44–1.00)
GFR, Estimated: >60 mL/min (ref >60)
Glucose, Bld: 130 mg/dL — ABNORMAL HIGH (ref 70–99)
Potassium: 3.3 mmol/L — ABNORMAL LOW (ref 3.5–5.1)
Sodium: 142 mmol/L (ref 135–145)

## 2022-08-04 LAB — CBC
HCT: 37.6 % (ref 36.0–46.0)
Hemoglobin: 12.1 g/dL (ref 12.0–15.0)
MCH: 30.6 pg (ref 26.0–34.0)
MCHC: 32.2 g/dL (ref 30.0–36.0)
MCV: 95.2 fL (ref 80.0–100.0)
Platelets: 219 10*3/uL (ref 150–400)
RBC: 3.95 MIL/uL (ref 3.87–5.11)
RDW: 13.7 % (ref 11.5–15.5)
WBC: 5.1 10*3/uL (ref 4.0–10.5)
nRBC: 0 % (ref 0.0–0.2)

## 2022-08-04 LAB — ECHOCARDIOGRAM COMPLETE
AR max vel: 3.25 cm2
AV Area VTI: 2.99 cm2
AV Area mean vel: 3.14 cm2
AV Mean grad: 5 mmHg
AV Peak grad: 10.2 mmHg
Ao pk vel: 1.6 m/s
Height: 60 in
MV M vel: 4.71 m/s
MV Peak grad: 88.7 mmHg
S' Lateral: 3.4 cm
Weight: 4232.83 oz

## 2022-08-04 MED ORDER — POTASSIUM CHLORIDE CRYS ER 20 MEQ PO TBCR
40.0000 meq | EXTENDED_RELEASE_TABLET | Freq: Once | ORAL | Status: AC
  Administered 2022-08-04: 40 meq via ORAL
  Filled 2022-08-04: qty 2

## 2022-08-04 MED ORDER — DIGOXIN 125 MCG PO TABS
0.2500 mg | ORAL_TABLET | ORAL | Status: AC
  Administered 2022-08-04 (×3): 0.25 mg via ORAL
  Filled 2022-08-04 (×3): qty 2

## 2022-08-04 MED ORDER — POTASSIUM CHLORIDE CRYS ER 20 MEQ PO TBCR
40.0000 meq | EXTENDED_RELEASE_TABLET | Freq: Two times a day (BID) | ORAL | Status: AC
  Administered 2022-08-04 (×2): 40 meq via ORAL
  Filled 2022-08-04 (×2): qty 2

## 2022-08-04 NOTE — Progress Notes (Signed)
Mobility Specialist - Progress Note   08/04/22 1617  Mobility  Activity Refused mobility   Pt just received dinner and wanted to eat. Will follow up if time permits.  Franki Monte  Mobility Specialist Please contact via Solicitor or Rehab office at [phone]

## 2022-08-04 NOTE — Progress Notes (Signed)
[facility] PROGRESS NOTE  Julia Mcfarland (DOB: 20-Mar-1946) XBD:[id number] PCP: Cipriano Mile, NP Outpatient Specialists: Cardiology, Dr. Sallyanne Kuster; Pulmonology, Dr. Chase Caller.   Brief Narrative: Julia Mcfarland is a 77 y.o. female with a history of AFib, chronic HFpEF, stage IIIa CKD, morbid obesity who presented to the ED at [facility] 1/31 as recommended by her cardiologist's office when she reported leg swelling and shortness of breath gradually worsening associated with palpitations. She takes metoprolol, eliquis, and lasix irregularly. She was in AFib with rate of 148bpm and appeared volume overloaded with BNP 284, pulmonary edema on CXR. Diltiazem bolus and infusion started and the patient was transferred to [facility] for acute HFpEF and AFib with RVR. Given IV lasix, home metoprolol and will attempt to wean down on diltiazem infusion. Echocardiogram shows severe biatrial enlargement with mod-severe MR, severe TR, dilated/plethoric IVC, reported concern for restrictive physiology.   Subjective: Swelling has improved since admission, but only been here since about midnight. No chest pain currently.   Objective: BP 105/87 (BP Location: Left Wrist)   Pulse 98   Temp 98.6 F (37 C) (Oral)   Resp 20   Ht (P) 5' (1.524 m)   Wt (P) 120 kg   SpO2 98%   BMI (P) 51.67 kg/m   Gen: Pleasant female in no distress Pulm: Crackles at bases, nonlabored  CV: Irreg irreg, rate in 110's. + dependent pitting edema GI: Soft, NT, ND, +BS Neuro: Alert and oriented. No new focal deficits. Ext: Warm, no deformities Skin: No rashes, lesions or ulcers on visualized skin   Venous U/S LE's: No DVT.  Assessment & Plan: Acute hypoxic respiratory failure: due to acute CHF, placed on 2L O2 overnight.  - Wean oxygen as tolerated  Acute on chronic HFpEF: Suspect precipitated by rapid AFib and nonadherence (both to beta blocker and lasix).  - Stable preserved LVSF by echo today, though restrictive physiology  mentioned with severe BAE, MR, TR.  - Planning to continue lasix '40mg'$  IV BID for now, but would appreciate input from cardiology.  - Strict I/O, weights.   Atrial fibrillation with RVR: Rate improving, still on '5mg'$ /hr diltiazem.  - Restarted metoprolol, BP stable. Wean diltiazem as able for HR <110bpm.  - Restarted eliquis, was not adherent to this PTA.   Stage IIIa CKD: Stable-to-improved - Monitor with diuresis.   Hypokalemia:  - Supplement and monitor  Morbid obesity: Body mass index is 51.67 kg/m (pended).   Patrecia Pour, MD [facility] [email or web address] 08/04/2022, 12:51 PM

## 2022-08-04 NOTE — Consult Note (Addendum)
Cardiology Consultation   Patient ID: Julia Mcfarland MRN: [id number]; DOB: 06/05/1946  Admit date: 08/03/2022 Date of Consult: 08/04/2022  PCP:  Cipriano Mile, NP   [facility] Cardiologist:  Sanda Klein, MD        Patient Profile:   Julia Mcfarland is a 77 y.o. female with a hx of chronic diastolic heart failure, paroxysmal atrial fibrillation with COVID-19, CKD IIIa, obesity who is being seen 08/04/2022 for the evaluation of atrial fibrillation and heart failure at the request of Dr. Bonner Puna.  History of Present Illness:   Ms. [place] presented to the ED at the suggestion of her PCP after she called to report symptoms of palpitations/racing heart beat, shortness of shortness of breath, and worsening lower extremity edema. Per patient, she has chronic lower extremity edema that fluctuates but acutely worsened over the last few days despite PRN use of '60mg'$  lasix. Shortness of breath also developed during this time. Legs reportedly became so swollen that walking became difficult. Patient works part time at [employer] and denies exertional limitations, chest pain, shortness of breath. She denies recent fevers, chills, myalgias. Regarding afib risk factors, patient denies known hx sleep apnea. She has never smoked and very rarely consumes alcohol.   In the ED, patient was placed on diltiazem and given Eliquis.   Patient has a history of paroxysmal atrial fibrillation, first noted when she had COVID-19 in 2021. She was started on Eliquis and Amiodarone at discharge that admission and both were stopped after no recurrence. However, she had a heart monitor placed at August 2021 cardiology follow up and was noted to have a prolonged episode of afib with RVR (>2 days). Due to this, Eliquis was restarted along with Metoprolol succinate 25 mg.    Past Medical History:  Diagnosis Date   Allergy    Arthritis    Chicken pox    Chronic venous insufficiency    a. Uses furosemide prn.    Degenerative joint disease    Morbid obesity ([place])    Osteoarthritis    PONV (postoperative nausea and vomiting)     Past Surgical History:  Procedure Laterality Date   ABDOMINAL HYSTERECTOMY  1988   CARPAL TUNNEL RELEASE Right    COLONOSCOPY WITH PROPOFOL N/A 09/01/2020   Procedure: COLONOSCOPY WITH PROPOFOL;  Surgeon: Clarene Essex, MD;  Location: [facility];  Service: Endoscopy;  Laterality: N/A;   POLYPECTOMY  09/01/2020   Procedure: POLYPECTOMY;  Surgeon: Clarene Essex, MD;  Location: [facility];  Service: Endoscopy;;   REPLACEMENT TOTAL KNEE Right 2010   TOTAL KNEE ARTHROPLASTY Left 03/07/2017   Procedure: LEFT TOTAL KNEE ARTHROPLASTY;  Surgeon: Meredith Pel, MD;  Location: [facility];  Service: Orthopedics;  Laterality: Left;     Home Medications:  Prior to Admission medications   Medication Sig Start Date End Date Taking? Authorizing Provider  albuterol (VENTOLIN HFA) 108 (90 Base) MCG/ACT inhaler INHALE 2 PUFFS INTO LUNGS EVERY 6 HOURS AS NEEDED FOR WHEEZING OR SHORTNESS OF BREATH Patient taking differently: Inhale 2 puffs into the lungs every 6 (six) hours as needed for wheezing or shortness of breath. 11/28/20  Yes Dutch Quint B, FNP  apixaban (ELIQUIS) 5 MG TABS tablet Take 1 tablet (5 mg total) by mouth 2 (two) times daily. 03/13/20  Yes Micca Matura, MD  Ascorbic Acid (VITAMIN C) 1000 MG tablet Take 1,000 mg by mouth daily.   Yes [provider]  Cholecalciferol (VITAMIN D3) 5000 UNITS CAPS Take 5,000 Units  by mouth daily.    Yes [provider]  furosemide (LASIX) 40 MG tablet Take 1.5 tablets (60 mg total) by mouth daily. 02/18/20  Yes Kroeger, Daleen Snook M., PA-C  hydroxypropyl methylcellulose / hypromellose (ISOPTO TEARS / GONIOVISC) 2.5 % ophthalmic solution Place 1 drop into both eyes daily as needed for dry eyes.   Yes [provider]  metoprolol succinate (TOPROL-XL) 25 MG 24 hr tablet TAKE 1 TABLET BY MOUTH EVERY DAY WITH OR IMMEDIATELY  FOLLOWING A MEAL Patient taking differently: Take 25 mg by mouth daily. 06/08/20  Yes Naomii Kreger, MD  polyethylene glycol (MIRALAX / GLYCOLAX) 17 g packet Take 17 g by mouth daily as needed for mild constipation.   Yes [provider]  vitamin B-12 (CYANOCOBALAMIN) 500 MCG tablet Take 500 mcg by mouth daily.   Yes [provider]    Inpatient Medications: Scheduled Meds:  apixaban  5 mg Oral BID   digoxin  0.25 mg Oral Q2H   furosemide  40 mg Intravenous BID   metoprolol succinate  25 mg Oral Daily   potassium chloride  40 mEq Oral BID   sodium chloride flush  3 mL Intravenous Q12H   Continuous Infusions:  diltiazem (CARDIZEM) infusion 5 mg/hr (08/04/22 0611)   PRN Meds: acetaminophen **OR** acetaminophen, albuterol, senna-docusate  Allergies:   No Known Allergies  Social History:   Social History   Socioeconomic History   Marital status: Married    Spouse name: Not on file   Number of children: Not on file   Years of education: Not on file   Highest education level: Not on file  Occupational History   Not on file  Tobacco Use   Smoking status: Never   Smokeless tobacco: Never  Vaping Use   Vaping Use: Never used  Substance and Sexual Activity   Alcohol use: Yes    Comment: occasional   Drug use: No   Sexual activity: Never  Other Topics Concern   Not on file  Social History Narrative   Lives in [place] w/ husband.  Says she's relatively active but does not routinely exercise.   Social Determinants of Health   Financial Resource Strain: Not on file  Food Insecurity: No Food Insecurity (08/03/2022)   Hunger Vital Sign    Worried About Running Out of Food in the Last Year: Never true    Ran Out of Food in the Last Year: Never true  Transportation Needs: No Transportation Needs (08/03/2022)   PRAPARE - Hydrologist (Medical): No    Lack of Transportation (Non-Medical): No  Physical Activity: Not on file   Stress: Not on file  Social Connections: Not on file  Intimate Partner Violence: Not At Risk (08/03/2022)   Humiliation, Afraid, Rape, and Kick questionnaire    Fear of Current or Ex-Partner: No    Emotionally Abused: No    Physically Abused: No    Sexually Abused: No    Family History:    Family History  Problem Relation Age of Onset   Arthritis Mother    Heart disease Mother    Hypertension Mother    Diabetes Mother    Stroke Father    Hypertension Father    Stroke Sister    Hypertension Sister    Heart disease Maternal Grandfather    Diabetes Paternal Grandmother    Heart disease Paternal Grandfather      ROS:  Please see the history of present illness.  All other ROS reviewed and negative.     Physical Exam/Data:   Vitals:   08/04/22 0423 08/04/22 0611 08/04/22 0753 08/04/22 1143  BP: 1'00/79 96/66 96/71 '$ 105/87  Pulse: (!) 120 96 91 98  Resp: 17 20 (!) 22 20  Temp:   98.4 F (36.9 C) 98.6 F (37 C)  TempSrc:   Oral Oral  SpO2: 98% 95% 99% 98%  Weight:      Height:        Intake/Output Summary (Last 24 hours) at 08/04/2022 1424 Last data filed at 08/04/2022 5102 Gross per 24 hour  Intake 163.86 ml  Output 2850 ml  Net -2686.14 ml      08/04/2022   12:29 AM 08/03/2022    1:53 PM 09/01/2020   11:39 AM  Last 3 Weights  Weight (lbs) 264 lb 8.8 oz 261 lb 14.5 oz 261 lb 14.5 oz  Weight (kg) 120 kg 118.8 kg 118.8 kg     Body mass index is 51.67 kg/m (pended).  General:  Well nourished, well developed, in no acute distress HEENT: normal Neck: prominent C-V wave  Vascular: No carotid bruits; Distal pulses 2+ bilaterally Cardiac:  irregularly irregular rate, rapid; quiet systolic murmur LLSB Lungs:  clear to auscultation bilaterally, no wheezing, rhonchi or rales  Abd: soft, nontender, no hepatomegaly  Ext: generalized lower extremity edema, minimal pitting Musculoskeletal:  No deformities, BUE and BLE strength normal and equal Skin: warm and dry  Neuro:   CNs 2-12 intact, no focal abnormalities noted Psych:  Normal affect   EKG:  The EKG was personally reviewed and demonstrates:  afib with RVR Telemetry:  Telemetry was personally reviewed and demonstrates:  atrial fibrillation with RVR. Recent rates between 90 and 120 bpm.   Relevant CV Studies:  08/04/22 TTE  IMPRESSIONS     1. Left ventricular ejection fraction, by estimation, is 60 to 65%. The  left ventricle has normal function. The left ventricle has no regional  wall motion abnormalities. Left ventricular diastolic function could not  be evaluated.   2. Right ventricular systolic function is normal. The right ventricular  size is normal. There is mildly elevated pulmonary artery systolic  pressure.   3. Left atrial size was severely dilated.   4. Right atrial size was severely dilated.   5. The mitral valve is normal in structure. Moderate to severe mitral  valve regurgitation. No evidence of mitral stenosis.   6. 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   7. The aortic valve is normal in structure. There is mild calcification  of the aortic valve. Aortic valve regurgitation is not visualized. Aortic  valve sclerosis/calcification is present, without any evidence of aortic  stenosis.   8. The inferior vena cava is dilated in size with <50% respiratory  variability, suggesting right atrial pressure of 15 mmHg.   9. Severe biatrial enlargement with mod-severe MR and severe TR  concerning for restrictive physiology.   FINDINGS   Left Ventricle: Left ventricular ejection fraction, by estimation, is 60  to 65%. The left ventricle has normal function. The left ventricle has no  regional wall motion abnormalities. The left ventricular internal cavity  size was normal in size. There is   no left ventricular hypertrophy. Left ventricular diastolic function  could not be evaluated due to atrial fibrillation. Left ventricular  diastolic function could not be evaluated.   Right  Ventricle: The right ventricular size is normal. No increase in  right ventricular wall thickness. Right ventricular systolic function  is  normal. There is mildly elevated pulmonary artery systolic pressure. The  tricuspid regurgitant velocity is 2.68   m/s, and with an assumed right atrial pressure of 15 mmHg, the estimated  right ventricular systolic pressure is 16.1 mmHg.   Left Atrium: Left atrial size was severely dilated.   Right Atrium: Right atrial size was severely dilated.   Pericardium: There is no evidence of pericardial effusion.   Mitral Valve: The mitral valve is normal in structure. Moderate to severe  mitral valve regurgitation. No evidence of mitral valve stenosis.   Tricuspid Valve: The tricuspid valve is normal in structure. Tricuspid  valve regurgitation is severe. No evidence of tricuspid stenosis.   Aortic Valve: The aortic valve is normal in structure. There is mild  calcification of the aortic valve. Aortic valve regurgitation is not  visualized. Aortic valve sclerosis/calcification is present, without any  evidence of aortic stenosis. Aortic valve  mean gradient measures 5.0 mmHg. Aortic valve peak gradient measures 10.2  mmHg. Aortic valve area, by VTI measures 2.99 cm.   Pulmonic Valve: The pulmonic valve was normal in structure. Pulmonic valve  regurgitation is trivial. No evidence of pulmonic stenosis.   Aorta: The aortic root is normal in size and structure.   Venous: The inferior vena cava is dilated in size with less than 50%  respiratory variability, suggesting right atrial pressure of 15 mmHg.   IAS/Shunts: No atrial level shunt detected by color flow Doppler.    Laboratory Data:  High Sensitivity Troponin:   Recent Labs  Lab 08/03/22 1408 08/03/22 1655  TROPONINIHS 5 4     Chemistry Recent Labs  Lab 08/03/22 1408 08/04/22 0205  NA 147* 142  K 3.6 3.3*  CL 110 108  CO2 28 25  GLUCOSE 142* 130*  BUN 19 12  CREATININE 1.06*  0.92  CALCIUM 9.3 8.6*  MG 2.0  --   GFRNONAA 55* >60  ANIONGAP 9 9    No results for input(s): "PROT", "ALBUMIN", "AST", "ALT", "ALKPHOS", "BILITOT" in the last 168 hours. Lipids No results for input(s): "CHOL", "TRIG", "HDL", "LABVLDL", "LDLCALC", "CHOLHDL" in the last 168 hours.  Hematology Recent Labs  Lab 08/03/22 1408 08/04/22 0205  WBC 4.9 5.1  RBC 4.27 3.95  HGB 13.2 12.1  HCT 40.7 37.6  MCV 95.3 95.2  MCH 30.9 30.6  MCHC 32.4 32.2  RDW 14.0 13.7  PLT 230 219   Thyroid  Recent Labs  Lab 08/03/22 1408  TSH 0.778    BNP Recent Labs  Lab 08/03/22 1408  BNP 284.4*    DDimer No results for input(s): "DDIMER" in the last 168 hours.   Radiology/Studies:  ECHOCARDIOGRAM COMPLETE  Result Date: 08/04/2022    ECHOCARDIOGRAM REPORT   Patient Name:   KEHLANI VANCAMP Date of Exam: 08/04/2022 Medical Rec #:  [id number]      Height:       62.0 in Accession #:    [id number]     Weight:       261.9 lb Date of Birth:  [birth year]      BSA:          2.144 m Patient Age:    49 years       BP:           96/66 mmHg Patient Gender: F              HR:           99 bpm. Exam Location:  Inpatient Procedure: 2D Echo, Cardiac Doppler and Color Doppler Indications:    Atrial Fibrillation I48.91, CHF-Acute Diastolic H88.50  History:        Patient has prior history of Echocardiogram examinations, most                 recent 03/16/2020. CHF; Arrythmias:Atrial Fibrillation. CKD.  Sonographer:    Ronny Flurry Referring Phys: [id number] [place]  1. Left ventricular ejection fraction, by estimation, is 60 to 65%. The left ventricle has normal function. The left ventricle has no regional wall motion abnormalities. Left ventricular diastolic function could not be evaluated.  2. Right ventricular systolic function is normal. The right ventricular size is normal. There is mildly elevated pulmonary artery systolic pressure.  3. Left atrial size was severely dilated.  4. Right atrial size was  severely dilated.  5. The mitral valve is normal in structure. Moderate to severe mitral valve regurgitation. No evidence of mitral stenosis.  6. 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  7. The aortic valve is normal in structure. There is mild calcification of the aortic valve. Aortic valve regurgitation is not visualized. Aortic valve sclerosis/calcification is present, without any evidence of aortic stenosis.  8. The inferior vena cava is dilated in size with <50% respiratory variability, suggesting right atrial pressure of 15 mmHg.  9. Severe biatrial enlargement with mod-severe MR and severe TR concerning for restrictive physiology. FINDINGS  Left Ventricle: Left ventricular ejection fraction, by estimation, is 60 to 65%. The left ventricle has normal function. The left ventricle has no regional wall motion abnormalities. The left ventricular internal cavity size was normal in size. There is  no left ventricular hypertrophy. Left ventricular diastolic function could not be evaluated due to atrial fibrillation. Left ventricular diastolic function could not be evaluated. Right Ventricle: The right ventricular size is normal. No increase in right ventricular wall thickness. Right ventricular systolic function is normal. There is mildly elevated pulmonary artery systolic pressure. The tricuspid regurgitant velocity is 2.68  m/s, and with an assumed right atrial pressure of 15 mmHg, the estimated right ventricular systolic pressure is 78.6 mmHg. Left Atrium: Left atrial size was severely dilated. Right Atrium: Right atrial size was severely dilated. Pericardium: There is no evidence of pericardial effusion. Mitral Valve: The mitral valve is normal in structure. Moderate to severe mitral valve regurgitation. No evidence of mitral valve stenosis. Tricuspid Valve: The tricuspid valve is normal in structure. 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 No evidence of tricuspid stenosis. Aortic Valve: The aortic valve is  normal in structure. There is mild calcification of the aortic valve. Aortic valve regurgitation is not visualized. Aortic valve sclerosis/calcification is present, without any evidence of aortic stenosis. Aortic valve mean gradient measures 5.0 mmHg. Aortic valve peak gradient measures 10.2 mmHg. Aortic valve area, by VTI measures 2.99 cm. Pulmonic Valve: The pulmonic valve was normal in structure. Pulmonic valve regurgitation is trivial. No evidence of pulmonic stenosis. Aorta: The aortic root is normal in size and structure. Venous: The inferior vena cava is dilated in size with less than 50% respiratory variability, suggesting right atrial pressure of 15 mmHg. IAS/Shunts: No atrial level shunt detected by color flow Doppler.  LEFT VENTRICLE PLAX 2D LVIDd:         5.40 cm   Diastology LVIDs:         3.40 cm   LV e' medial:    7.62 cm/s LV PW:         1.00 cm  LV E/e' medial:  13.4 LV IVS:        1.00 cm   LV e' lateral:   8.59 cm/s LVOT diam:     2.20 cm   LV E/e' lateral: 11.9 LV SV:         78 LV SV Index:   36 LVOT Area:     3.80 cm  RIGHT VENTRICLE             IVC RV S prime:     11.10 cm/s  IVC diam: 3.00 cm TAPSE (M-mode): 1.8 cm LEFT ATRIUM           Index        RIGHT ATRIUM           Index LA diam:      4.00 cm 1.87 cm/m   RA Area:     24.50 cm LA Vol (A2C): 83.5 ml 38.95 ml/m  RA Volume:   79.50 ml  37.08 ml/m LA Vol (A4C): 82.3 ml 38.39 ml/m  AORTIC VALVE AV Area (Vmax):    3.25 cm AV Area (Vmean):   3.14 cm AV Area (VTI):     2.99 cm AV Vmax:           160.00 cm/s AV Vmean:          106.000 cm/s AV VTI:            0.260 m AV Peak Grad:      10.2 mmHg AV Mean Grad:      5.0 mmHg LVOT Vmax:         136.67 cm/s LVOT Vmean:        87.633 cm/s LVOT VTI:          0.205 m LVOT/AV VTI ratio: 0.79  AORTA Ao Root diam: 3.20 cm Ao Asc diam:  3.40 cm MR Peak grad: 88.7 mmHg     TRICUSPID VALVE MR Mean grad: 63.0 mmHg     TR Peak grad:   28.7 mmHg MR Vmax:      471.00 cm/s   TR Vmax:        268.00 cm/s  MR Vmean:     374.0 cm/s MV E velocity: 102.00 cm/s  SHUNTS                             Systemic VTI:  0.20 m                             Systemic Diam: 2.20 cm Glori Bickers MD Electronically signed by Glori Bickers MD Signature Date/Time: 08/04/2022/11:36:50 AM    Final    US Venous Img Lower Unilateral Left  Result Date: 08/03/2022 CLINICAL DATA:  Left lower extremity pain and edema for the past 2 days. Evaluate for DVT. EXAM: LEFT LOWER EXTREMITY VENOUS DOPPLER ULTRASOUND TECHNIQUE: Gray-scale sonography with graded compression, as well as color Doppler and duplex ultrasound were performed to evaluate the lower extremity deep venous systems from the level of the common femoral vein and including the common femoral, femoral, profunda femoral, popliteal and calf veins including the posterior tibial, peroneal and gastrocnemius veins when visible. The superficial great saphenous vein was also interrogated. Spectral Doppler was utilized to evaluate flow at rest and with distal augmentation maneuvers in the common femoral, femoral and popliteal veins. COMPARISON:  None Available. FINDINGS: Contralateral Common Femoral Vein: Respiratory phasicity is normal and  symmetric with the symptomatic side. No evidence of thrombus. Normal compressibility. Common Femoral Vein: No evidence of thrombus. Normal compressibility, respiratory phasicity and response to augmentation. Saphenofemoral Junction: No evidence of thrombus. Normal compressibility and flow on color Doppler imaging. Profunda Femoral Vein: No evidence of thrombus. Normal compressibility and flow on color Doppler imaging. Femoral Vein: No evidence of thrombus. Normal compressibility, respiratory phasicity and response to augmentation. Popliteal Vein: No evidence of thrombus. Normal compressibility, respiratory phasicity and response to augmentation. Calf Veins: No evidence of thrombus. Normal compressibility and flow on color Doppler imaging. Superficial  Great Saphenous Vein: No evidence of thrombus. Normal compressibility. Other Findings:  None. IMPRESSION: No evidence of DVT within the left lower extremity. Electronically Signed   By: Sandi Mariscal M.D.   On: 08/03/2022 17:18   DG Chest Port 1 View  Result Date: 08/03/2022 CLINICAL DATA:  Shortness of breath. EXAM: PORTABLE CHEST 1 VIEW COMPARISON:  11/25/2019 FINDINGS: Cardiac enlargement. Blunting of the right costophrenic angle. Increase interstitial markings are noted bilaterally. No airspace opacities. Visualized osseous structures appear intact. IMPRESSION: 1. Suspect mild congestive heart failure. 2. Small right pleural effusion. Electronically Signed   By: Kerby Moors M.D.   On: 08/03/2022 14:33     Assessment and Plan:  Julia Mcfarland is a 77 y.o. female with a hx of chronic diastolic heart failure, paroxysmal atrial fibrillation with COVID-19, CKD IIIa, obesity who is being seen 08/04/2022 for the evaluation of atrial fibrillation and heart failure at the request of Dr. Bonner Puna.  Paroxysmal atrial fibrillation with RVR  Patient with hx paroxysmal afib first noted with COVID-19 in 2021 now admitted with afib/RVR and dyspnea. Due to mixed compliance with eliquis, no DCCV in the ED. Rates are now marginally controlled on diltiazem infusion and patient no longer has shortness of breath or palpitations.  Suspect patient has had higher atrial fibrillation burden than realized given severely dilated atria on echo. At this time, restoration of NSR will be difficult and rate control is preferred strategy. Given low BP on only '5mg'$ /hr diltiazem, rate control options are limited. Will start Digoxin. K 3.3, will replace prior to initiation. Wean diltiazem off as rates improve with Digoxin. Continue metoprolol Succinate '25mg'$ . Would titrate up if BP improves. Continue Eliquis '5mg'$  BID.  Acute on chronic HFpEF, diastolic dysfunction Severe tricuspid regurgitation Moderate to Severe MR  Patient with  LVEF 60-65%, severe TR, moderate to severe MR (both valves structurally normal), severe LA and RA dilation, significant IVC dilation on echocardiogram completed today. BNP elevated to 284.4. Possible atrial functional MR/TR given suspicion for longstanding paroxysmal afib. She has received a total of '80mg'$  IV lasix with net output 2.8L reported.   Patient previously taking '60mg'$  lasix PRN will likely need daily dosing of Lasix given significant diastolic dysfunction. Would start with Lasix '40mg'$  PO +/- extra dose for weight gain/leg swelling. Consider initiation of SGLT2 per HFpEF guidelines. Cannot rule out possible restrictive disease at this time. Plan for outpatient cardiac PYP to assess for possible amyloid    Risk Assessment/Risk Scores:        New York Heart Association (NYHA) Functional Class NYHA Class II  CHA2DS2-VASc Score = 4   This indicates a 4.8% annual risk of stroke. The patient's score is based upon: CHF History: 1 HTN History: 0 Diabetes History: 0 Stroke History: 0 Vascular Disease History: 0 Age Score: 2 Gender Score: 1         For questions or updates, please contact [facility] Please consult [email or web address] for contact info under    Signed, Lily Kocher, PA-C  08/04/2022 2:24 PM  I have seen and examined the patient along with Lily Kocher, PA-C.  I have reviewed the chart, notes and new data.  I agree with PA/NP's note.  Key new complaints: Breathing has improved substantially since she came to the hospital.  Edema is also better, although she still has substantial lower extremity swelling. Key examination changes: Prominent jugular venous distention with "V waves" rising roughly 12 cm above the sternoclavicular joint.  Clear lungs.  3+ soft pitting edema of the lower extremities to the knees.  Irregular rapid rhythm, holosystolic murmur at left lower sternal border and at the apex. Key new findings / data: Echocardiogram reviewed.  Compared to  2021 there has been severe worsening of the tricuspid insufficiency as well as moderate worsening of the mitral insufficiency.  Both atria are severely dilated.  Left ventricular systolic function is preserved.  Right ventricular systolic function is at the lower limit of normal.  The inferior vena cava is markedly dilated.  PLAN: Initial presentation with atrial fibrillation during COVID-19 infection in 2021, but was subsequently demonstrated to have recurrent paroxysmal atrial fibrillation on an event monitor.  This is her first cardiology interaction since 2021. Compliance with anticoagulation has been spotty recently. Several possibilities here: -Mitral insufficiency and tricuspid insufficiency are both secondary to acute volume overload in the setting of prolonged persistent atrial fibrillation with rapid ventricular response.  If that is the case, both valvular abnormalities should respond to diuretics and better rate control. 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  Will check immunoelectrophoresis for light chains.  As an outpatient, when technetium 75mPYP tracer is available again (current nProducer, television/film/video, we will plan evaluation for amyloidosis.    Either way, she requires better ventricular rate control and use of conventional AV blocking agents is limited by her blood pressure.  Options are to add either digoxin or amiodarone, which both have disadvantages.  Since there has been recent noncompliance with anticoagulation, will try digoxin first.  Have ordered a short "load" with digoxin but first need to replete her potassium.  Reevaluate ventricular rate response in AM.   MSanda Klein MD, [facility] [phone]/07/2022, 3:07 PM

## 2022-08-04 NOTE — Progress Notes (Signed)
Echocardiogram 2D Echocardiogram has been performed.  Ronny Flurry 08/04/2022, 10:37 AM

## 2022-08-05 ENCOUNTER — Other Ambulatory Visit (HOSPITAL_COMMUNITY): Payer: Self-pay

## 2022-08-05 ENCOUNTER — Other Ambulatory Visit: Payer: Self-pay | Admitting: Cardiology

## 2022-08-05 DIAGNOSIS — I480 Paroxysmal atrial fibrillation: Secondary | ICD-10-CM

## 2022-08-05 DIAGNOSIS — I4891 Unspecified atrial fibrillation: Secondary | ICD-10-CM | POA: Diagnosis not present

## 2022-08-05 LAB — BASIC METABOLIC PANEL
Anion gap: 8 (ref 5–15)
BUN: 14 mg/dL (ref 8–23)
CO2: 25 mmol/L (ref 22–32)
Calcium: 8.9 mg/dL (ref 8.9–10.3)
Chloride: 107 mmol/L (ref 98–111)
Creatinine, Ser: 1.08 mg/dL — ABNORMAL HIGH (ref 0.44–1.00)
GFR, Estimated: 54 mL/min — ABNORMAL LOW (ref >60)
Glucose, Bld: 136 mg/dL — ABNORMAL HIGH (ref 70–99)
Potassium: 4.2 mmol/L (ref 3.5–5.1)
Sodium: 140 mmol/L (ref 135–145)

## 2022-08-05 LAB — CBC
HCT: 39.3 % (ref 36.0–46.0)
Hemoglobin: 12.4 g/dL (ref 12.0–15.0)
MCH: 30.2 pg (ref 26.0–34.0)
MCHC: 31.6 g/dL (ref 30.0–36.0)
MCV: 95.9 fL (ref 80.0–100.0)
Platelets: 239 10*3/uL (ref 150–400)
RBC: 4.1 MIL/uL (ref 3.87–5.11)
RDW: 14 % (ref 11.5–15.5)
WBC: 5.9 10*3/uL (ref 4.0–10.5)
nRBC: 0 % (ref 0.0–0.2)

## 2022-08-05 MED ORDER — METOPROLOL SUCCINATE ER 50 MG PO TB24
50.0000 mg | ORAL_TABLET | Freq: Every day | ORAL | Status: DC
  Filled 2022-08-05: qty 1

## 2022-08-05 MED ORDER — DIGOXIN 125 MCG PO TABS
0.1250 mg | ORAL_TABLET | Freq: Every day | ORAL | Status: DC
  Administered 2022-08-05 – 2022-08-06 (×2): 0.125 mg via ORAL
  Filled 2022-08-05 (×2): qty 1

## 2022-08-05 MED ORDER — METOPROLOL SUCCINATE ER 50 MG PO TB24
75.0000 mg | ORAL_TABLET | Freq: Every day | ORAL | Status: DC
  Administered 2022-08-05 – 2022-08-06 (×2): 75 mg via ORAL
  Filled 2022-08-05 (×2): qty 1

## 2022-08-05 NOTE — Care Management Important Message (Signed)
Important Message  Patient Details  Name: Julia Mcfarland MRN: [id number] Date of Birth: 10/21/1945   Medicare Important Message Given:  Yes     Shelda Altes 08/05/2022, 10:49 AM

## 2022-08-05 NOTE — Progress Notes (Signed)
[facility] PROGRESS NOTE  Julia Mcfarland (DOB: 1945/10/11) ZYS:[id number] PCP: Cipriano Mile, NP Outpatient Specialists: Cardiology, Dr. Sallyanne Kuster; Pulmonology, Dr. Chase Caller.   Brief Narrative: Julia Mcfarland is a 77 y.o. female with a history of AFib, chronic HFpEF, stage IIIa CKD, morbid obesity who presented to the ED at [facility] 1/31 as recommended by her cardiologist's office when she reported leg swelling and shortness of breath gradually worsening associated with palpitations. She takes metoprolol, eliquis, and lasix irregularly. She was in AFib with rate of 148bpm and appeared volume overloaded with BNP 284, pulmonary edema on CXR. Diltiazem bolus and infusion started and the patient was transferred to [facility] for acute HFpEF and AFib with RVR. Given IV lasix, home metoprolol and will attempt to wean down on diltiazem infusion. Echocardiogram shows severe biatrial enlargement with mod-severe MR, severe TR, dilated/plethoric IVC, reported concern for restrictive physiology.   Subjective: Feeling much better, urinated >a gallon yesterday. Walking more easily. Says her legs are still usually less swollen than this. No chest pain. Refused mobility.   Objective: BP (!) 126/93   Pulse 95   Temp 98 F (36.7 C) (Oral)   Resp 18   Ht (P) 5' (1.524 m)   Wt 117.5 kg   SpO2 95%   BMI (P) 50.59 kg/m   Gen: No distress, pleasant Pulm: Clear, nonlabored  CV: Irr irr, pitting edema extending to thigh.  GI: Soft, NT, ND, +BS  Neuro: Alert and oriented. No new focal deficits. Ext: Warm, no deformities Skin: No rashes, lesions or ulcers on visualized skin   Venous U/S LE's: No DVT.  Assessment & Plan: Acute hypoxic respiratory failure: due to acute CHF, resolved.  Acute on chronic HFpEF: Suspect precipitated by rapid AFib and nonadherence (both to beta blocker and lasix).  - Stable preserved LVSF by echo, though restrictive physiology mentioned with severe BAE, MR, TR. PYP scan to be  performed after discharge, cardiology follow up to be arranged. - Planning to continue lasix '40mg'$  IV BID for now as UOP has been robust, renal function improved, but remains peripherally volume overloaded. Recheck BMP in AM, planning daily lasix dosing after discharge. - Strict I/O, weights.   Atrial fibrillation with RVR: Rate improving, though remained on dilt gtt overnight.  - Augment metoprolol per cardiology. Will watch after diltiazem infusion is stopped (CCB less preferred with low-to-low normal EF).  - Restarted eliquis, was not adherent to this PTA.   Stage IIIa CKD: Stable overall - Monitor with diuresis in AM.   Hypokalemia:  - Supplement and monitor  Morbid obesity: Body mass index is 50.59 kg/m (pended).   Patrecia Pour, MD [facility] [email or web address] 08/05/2022, 2:08 PM

## 2022-08-05 NOTE — Progress Notes (Signed)
Heart Failure Nurse Navigator Progress Note  PCP: Cipriano Mile, NP PCP-Cardiologist: [place] Admission Diagnosis: None.  Admitted from: Home  Presentation:   Julia Mcfarland presented with shortness of breath, bilateral lower leg edema, A-Fib, fatigue, patient reports to medication compliance, however, "sometimes will miss doses", BP 126/83, HR 150, BMI 47.90, IV cardizem given, BNP 284, Troponin 5, . Diet/ fluid restrictions, patient reports to watching how much salt she consumes as well as fluid amounts. Education on taking all medications as prescribed, including her lasix. And attending all medical appointments. Patient verbalized her understanding of education, a HF TOC appointment was scheduled for 08/23/2022 @ 2 pm.   ECHO/ LVEF: 60-65% HFpEF  Clinical Course:  Past Medical History:  Diagnosis Date   Allergy    Arthritis    Chicken pox    Chronic venous insufficiency    a. Uses furosemide prn.   Degenerative joint disease    Morbid obesity ([place])    Osteoarthritis    PONV (postoperative nausea and vomiting)      Social History   Socioeconomic History   Marital status: Married    Spouse name: Not on file   Number of children: Not on file   Years of education: Not on file   Highest education level: Not on file  Occupational History   Not on file  Tobacco Use   Smoking status: Never   Smokeless tobacco: Never  Vaping Use   Vaping Use: Never used  Substance and Sexual Activity   Alcohol use: Yes    Comment: occasional   Drug use: No   Sexual activity: Never  Other Topics Concern   Not on file  Social History Narrative   Lives in [place] w/ husband.  Says she's relatively active but does not routinely exercise.   Social Determinants of Health   Financial Resource Strain: Low Risk  (08/05/2022)   Overall Financial Resource Strain (CARDIA)    Difficulty of Paying Living Expenses: Not very hard  Food Insecurity: No Food Insecurity (08/03/2022)   Hunger Vital  Sign    Worried About Running Out of Food in the Last Year: Never true    Ran Out of Food in the Last Year: Never true  Transportation Needs: No Transportation Needs (08/03/2022)   PRAPARE - Hydrologist (Medical): No    Lack of Transportation (Non-Medical): No  Physical Activity: Not on file  Stress: Not on file  Social Connections: Not on file   Education Assessment and Provision:  Detailed education and instructions provided on heart failure disease management including the following:  Signs and symptoms of Heart Failure When to call the physician Importance of daily weights Low sodium diet Fluid restriction Medication management Anticipated future follow-up appointments  Patient education given on each of the above topics.  Patient acknowledges understanding via teach back method and acceptance of all instructions.  Education Materials:  "Living Better With Heart Failure" Booklet, HF zone tool, & Daily Weight Tracker Tool.  Patient has scale at home: yes Patient has pill box at home: Yes    High Risk Criteria for Readmission and/or Poor Patient Outcomes: Heart failure hospital admissions (last 6 months): 0  No Show rate: 0 Difficult social situation: No Demonstrates medication adherence: No , Lasix Primary Language: English Literacy level: Reading, writing, and comprehension  Barriers of Care:   Medication compliance Daily weights  Considerations/Referrals:   Referral made to Heart Failure Pharmacist Stewardship: Yes Referral made to Heart Failure  CSW/NCM TOC: No Referral made to Heart & Vascular TOC clinic: Yes, 08/23/2022 @ 2 pm.  Items for Follow-up on DC/TOC: Medication compliance ( lasix)  Daily weights Continued HF Education   Earnestine Leys, BSN, RN Heart Failure Transport planner Only

## 2022-08-05 NOTE — TOC Initial Note (Signed)
Transition of Care [facility]) - Initial/Assessment Note    Patient Details  Name: TAMIKI KUBA MRN: [id number] Date of Birth: [birth year]  [facility]) CM/SW Contact:    Bethena Roys, RN Phone Number: 08/05/2022, 3:26 PM  Clinical Narrative: Patient presented for shortness of breath, palpitations, and lower extremity edema. PTA patient states she was independent from home with daughter. Daughter works during the day and patient states she can still drive (actually drove herself to the hospital). Patient has DME cane in the room. Case Manager discussed home health needs and patient feels that she will not need any services at this time. Case Manager will continue to follow for transition of care needs as the patient progresses.                   Expected Discharge Plan: Home/Self Care Barriers to Discharge: Continued Medical Work up   Patient Goals and CMS Choice Patient states their goals for this hospitalization and ongoing recovery are:: patient wants to return home.   Choice offered to / list presented to : NA      Expected Discharge Plan and Services In-house Referral: NA Discharge Planning Services: CM Consult Post Acute Care Choice: NA Living arrangements for the past 2 months: Single Family Home                   DME Agency: NA   Prior Living Arrangements/Services Living arrangements for the past 2 months: [place] Lives with:: Adult Children Patient language and need for interpreter reviewed:: Yes Do you feel safe going back to the place where you live?: Yes      Need for Family Participation in Patient Care: Yes (Comment) Care giver support system in place?: Yes (comment) Current home services: DME (cane) Criminal Activity/Legal Involvement Pertinent to Current Situation/Hospitalization: No - Comment as needed  Activities of Daily Living   ADL Screening (condition at time of admission) Patient's cognitive ability adequate to safely  complete daily activities?: Yes Is the patient deaf or have difficulty hearing?: No Does the patient have difficulty seeing, even when wearing glasses/contacts?: No Does the patient have difficulty concentrating, remembering, or making decisions?: No Patient able to express need for assistance with ADLs?: Yes Does the patient have difficulty dressing or bathing?: No Independently performs ADLs?: Yes (appropriate for developmental age) Does the patient have difficulty walking or climbing stairs?: No Weakness of Legs: None Weakness of Arms/Hands: None  Permission Sought/Granted Permission sought to share information with : Case Manager, Family Supports    Emotional Assessment Appearance:: Appears stated age Attitude/Demeanor/Rapport: Engaged Affect (typically observed): Appropriate Orientation: : Oriented to Self, Oriented to Place, Oriented to  Time, Oriented to Situation Alcohol / Substance Use: Not Applicable Psych Involvement: No (comment)  Admission diagnosis:  Atrial fibrillation ([place]) [I48.91] Atrial fibrillation with RVR ([place]) [I48.91] Acute on chronic congestive heart failure, unspecified heart failure type ([place]) [I50.9] Patient Active Problem List   Diagnosis Date Noted   Atrial fibrillation with RVR ([place]) 08/03/2022   Acute on chronic diastolic CHF (congestive heart failure) ([place]) 08/03/2022   Stage 3a chronic kidney disease (CKD) ([place]) 08/03/2022   Persistent atrial fibrillation ([place]) 10/28/2019   Chronic venous insufficiency 02/18/2019   Prediabetes 08/21/2014   Osteoporosis 08/14/2013   PCP:  Cipriano Mile, NP Pharmacy:   [place] ([place]), [place] - [address] [place] ([place]) [place] [address] Phone: [phone] Fax: [phone]  [facility] [place] -  [place] [address] Phone: [phone] Fax: [phone]  Social Determinants of Health  (SDOH) Social History: SDOH Screenings   Food Insecurity: No Food Insecurity (08/03/2022)  Housing: Low Risk  (08/03/2022)  Transportation Needs: No Transportation Needs (08/03/2022)  Utilities: Not At Risk (08/03/2022)  Alcohol Screen: Low Risk  (08/05/2022)  Depression (PHQ2-9): Low Risk  (03/19/2020)  Financial Resource Strain: Low Risk  (08/05/2022)  Tobacco Use: Low Risk  (08/03/2022)   SDOH Interventions: Alcohol Usage Interventions: Intervention Not Indicated (Score <7) Financial Strain Interventions: Intervention Not Indicated   Readmission Risk Interventions     No data to display

## 2022-08-05 NOTE — Progress Notes (Addendum)
Rounding Note    Patient Name: Julia Mcfarland Date of Encounter: 08/05/2022  [place] Cardiologist: Sanda Klein, MD   Subjective   Patient excited to report that she feels significantly improved today. She says that she was able to walk around the room and bath without any chest pain, palpitations, shortness of breath.  Inpatient Medications    Scheduled Meds:  apixaban  5 mg Oral BID   furosemide  40 mg Intravenous BID   metoprolol succinate  50 mg Oral Daily   sodium chloride flush  3 mL Intravenous Q12H   Continuous Infusions:  diltiazem (CARDIZEM) infusion 5 mg/hr (08/05/22 0411)   PRN Meds: acetaminophen **OR** acetaminophen, albuterol, senna-docusate   Vital Signs    Vitals:   08/04/22 1824 08/04/22 2020 08/04/22 2351 08/05/22 0343  BP:  109/81 (!) 118/90 123/87  Pulse: (!) 103 (!) 105 100 94  Resp:  (!) '22 16 18  '$ Temp:  99.4 F (37.4 C) 98.9 F (37.2 C) 98 F (36.7 C)  TempSrc:  Oral Oral Oral  SpO2:  96% 96% 95%  Weight:    117.5 kg  Height:        Intake/Output Summary (Last 24 hours) at 08/05/2022 0817 Last data filed at 08/05/2022 0411 Gross per 24 hour  Intake 110.28 ml  Output 1500 ml  Net -1389.72 ml      08/05/2022    3:43 AM 08/04/2022   12:29 AM 08/03/2022    1:53 PM  Last 3 Weights  Weight (lbs) 259 lb 0.7 oz 264 lb 8.8 oz 261 lb 14.5 oz  Weight (kg) 117.5 kg 120 kg 118.8 kg      Telemetry    Atrial fibrillation, ventricular rates 90s-low 100s - Personally Reviewed  ECG    No new tracing - Personally Reviewed  Physical Exam   GEN: No acute distress.   Neck: prominent C-V wave  Cardiac: RRR, no murmurs, rubs, or gallops.  Respiratory: Clear to auscultation bilaterally. GI: Soft, nontender, non-distended  MS: Improved but still significant bilateral soft lower extremity edema Neuro:  Nonfocal  Psych: Normal affect   Labs    High Sensitivity Troponin:   Recent Labs  Lab 08/03/22 1408 08/03/22 1655   TROPONINIHS 5 4     Chemistry Recent Labs  Lab 08/03/22 1408 08/04/22 0205 08/05/22 0432  NA 147* 142 140  K 3.6 3.3* 4.2  CL 110 108 107  CO2 '28 25 25  '$ GLUCOSE 142* 130* 136*  BUN '19 12 14  '$ CREATININE 1.06* 0.92 1.08*  CALCIUM 9.3 8.6* 8.9  MG 2.0  --   --   GFRNONAA 55* >60 54*  ANIONGAP '9 9 8    '$ Lipids No results for input(s): "CHOL", "TRIG", "HDL", "LABVLDL", "LDLCALC", "CHOLHDL" in the last 168 hours.  Hematology Recent Labs  Lab 08/03/22 1408 08/04/22 0205 08/05/22 0432  WBC 4.9 5.1 5.9  RBC 4.27 3.95 4.10  HGB 13.2 12.1 12.4  HCT 40.7 37.6 39.3  MCV 95.3 95.2 95.9  MCH 30.9 30.6 30.2  MCHC 32.4 32.2 31.6  RDW 14.0 13.7 14.0  PLT 230 219 239   Thyroid  Recent Labs  Lab 08/03/22 1408  TSH 0.778    BNP Recent Labs  Lab 08/03/22 1408  BNP 284.4*    DDimer No results for input(s): "DDIMER" in the last 168 hours.   Radiology    ECHOCARDIOGRAM COMPLETE  Result Date: 08/04/2022    ECHOCARDIOGRAM REPORT   Patient Name:  Julia Mcfarland Date of Exam: 08/04/2022 Medical Rec #:  [id number]      Height:       62.0 in Accession #:    [id number]     Weight:       261.9 lb Date of Birth:  1945/09/09      BSA:          2.144 m Patient Age:    77 years       BP:           96/66 mmHg Patient Gender: F              HR:           99 bpm. Exam Location:  Inpatient Procedure: 2D Echo, Cardiac Doppler and Color Doppler Indications:    Atrial Fibrillation I48.91, CHF-Acute Diastolic A12.87  History:        Patient has prior history of Echocardiogram examinations, most                 recent 03/16/2020. CHF; Arrythmias:Atrial Fibrillation. CKD.  Sonographer:    Ronny Flurry Referring Phys: [id number] [place]  1. Left ventricular ejection fraction, by estimation, is 60 to 65%. The left ventricle has normal function. The left ventricle has no regional wall motion abnormalities. Left ventricular diastolic function could not be evaluated.  2. Right ventricular  systolic function is normal. The right ventricular size is normal. There is mildly elevated pulmonary artery systolic pressure.  3. Left atrial size was severely dilated.  4. Right atrial size was severely dilated.  5. The mitral valve is normal in structure. Moderate to severe mitral valve regurgitation. No evidence of mitral stenosis.  6. 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  7. The aortic valve is normal in structure. There is mild calcification of the aortic valve. Aortic valve regurgitation is not visualized. Aortic valve sclerosis/calcification is present, without any evidence of aortic stenosis.  8. The inferior vena cava is dilated in size with <50% respiratory variability, suggesting right atrial pressure of 15 mmHg.  9. Severe biatrial enlargement with mod-severe MR and severe TR concerning for restrictive physiology. FINDINGS  Left Ventricle: Left ventricular ejection fraction, by estimation, is 60 to 65%. The left ventricle has normal function. The left ventricle has no regional wall motion abnormalities. The left ventricular internal cavity size was normal in size. There is  no left ventricular hypertrophy. Left ventricular diastolic function could not be evaluated due to atrial fibrillation. Left ventricular diastolic function could not be evaluated. Right Ventricle: The right ventricular size is normal. No increase in right ventricular wall thickness. Right ventricular systolic function is normal. There is mildly elevated pulmonary artery systolic pressure. The tricuspid regurgitant velocity is 2.68  m/s, and with an assumed right atrial pressure of 15 mmHg, the estimated right ventricular systolic pressure is 94.7 mmHg. Left Atrium: Left atrial size was severely dilated. Right Atrium: Right atrial size was severely dilated. Pericardium: There is no evidence of pericardial effusion. Mitral Valve: The mitral valve is normal in structure. Moderate to severe mitral valve regurgitation. No evidence of  mitral valve stenosis. Tricuspid Valve: The tricuspid valve is normal in structure. 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 No evidence of tricuspid stenosis. Aortic Valve: The aortic valve is normal in structure. There is mild calcification of the aortic valve. Aortic valve regurgitation is not visualized. Aortic valve sclerosis/calcification is present, without any evidence of aortic stenosis. Aortic valve mean gradient measures 5.0 mmHg. Aortic valve peak gradient  measures 10.2 mmHg. Aortic valve area, by VTI measures 2.99 cm. Pulmonic Valve: The pulmonic valve was normal in structure. Pulmonic valve regurgitation is trivial. No evidence of pulmonic stenosis. Aorta: The aortic root is normal in size and structure. Venous: The inferior vena cava is dilated in size with less than 50% respiratory variability, suggesting right atrial pressure of 15 mmHg. IAS/Shunts: No atrial level shunt detected by color flow Doppler.  LEFT VENTRICLE PLAX 2D LVIDd:         5.40 cm   Diastology LVIDs:         3.40 cm   LV e' medial:    7.62 cm/s LV PW:         1.00 cm   LV E/e' medial:  13.4 LV IVS:        1.00 cm   LV e' lateral:   8.59 cm/s LVOT diam:     2.20 cm   LV E/e' lateral: 11.9 LV SV:         78 LV SV Index:   36 LVOT Area:     3.80 cm  RIGHT VENTRICLE             IVC RV S prime:     11.10 cm/s  IVC diam: 3.00 cm TAPSE (M-mode): 1.8 cm LEFT ATRIUM           Index        RIGHT ATRIUM           Index LA diam:      4.00 cm 1.87 cm/m   RA Area:     24.50 cm LA Vol (A2C): 83.5 ml 38.95 ml/m  RA Volume:   79.50 ml  37.08 ml/m LA Vol (A4C): 82.3 ml 38.39 ml/m  AORTIC VALVE AV Area (Vmax):    3.25 cm AV Area (Vmean):   3.14 cm AV Area (VTI):     2.99 cm AV Vmax:           160.00 cm/s AV Vmean:          106.000 cm/s AV VTI:            0.260 m AV Peak Grad:      10.2 mmHg AV Mean Grad:      5.0 mmHg LVOT Vmax:         136.67 cm/s LVOT Vmean:        87.633 cm/s LVOT VTI:          0.205 m LVOT/AV VTI ratio: 0.79  AORTA Ao  Root diam: 3.20 cm Ao Asc diam:  3.40 cm MR Peak grad: 88.7 mmHg     TRICUSPID VALVE MR Mean grad: 63.0 mmHg     TR Peak grad:   28.7 mmHg MR Vmax:      471.00 cm/s   TR Vmax:        268.00 cm/s MR Vmean:     374.0 cm/s MV E velocity: 102.00 cm/s  SHUNTS                             Systemic VTI:  0.20 m                             Systemic Diam: 2.20 cm Glori Bickers MD Electronically signed by Glori Bickers MD Signature Date/Time: 08/04/2022/11:36:50 AM    Final    US Venous Img Lower Unilateral Left  Result Date: 08/03/2022 CLINICAL DATA:  Left lower extremity pain and edema for the past 2 days. Evaluate for DVT. EXAM: LEFT LOWER EXTREMITY VENOUS DOPPLER ULTRASOUND TECHNIQUE: Gray-scale sonography with graded compression, as well as color Doppler and duplex ultrasound were performed to evaluate the lower extremity deep venous systems from the level of the common femoral vein and including the common femoral, femoral, profunda femoral, popliteal and calf veins including the posterior tibial, peroneal and gastrocnemius veins when visible. The superficial great saphenous vein was also interrogated. Spectral Doppler was utilized to evaluate flow at rest and with distal augmentation maneuvers in the common femoral, femoral and popliteal veins. COMPARISON:  None Available. FINDINGS: Contralateral Common Femoral Vein: Respiratory phasicity is normal and symmetric with the symptomatic side. No evidence of thrombus. Normal compressibility. Common Femoral Vein: No evidence of thrombus. Normal compressibility, respiratory phasicity and response to augmentation. Saphenofemoral Junction: No evidence of thrombus. Normal compressibility and flow on color Doppler imaging. Profunda Femoral Vein: No evidence of thrombus. Normal compressibility and flow on color Doppler imaging. Femoral Vein: No evidence of thrombus. Normal compressibility, respiratory phasicity and response to augmentation. Popliteal Vein: No evidence of  thrombus. Normal compressibility, respiratory phasicity and response to augmentation. Calf Veins: No evidence of thrombus. Normal compressibility and flow on color Doppler imaging. Superficial Great Saphenous Vein: No evidence of thrombus. Normal compressibility. Other Findings:  None. IMPRESSION: No evidence of DVT within the left lower extremity. Electronically Signed   By: Sandi Mariscal M.D.   On: 08/03/2022 17:18   DG Chest Port 1 View  Result Date: 08/03/2022 CLINICAL DATA:  Shortness of breath. EXAM: PORTABLE CHEST 1 VIEW COMPARISON:  11/25/2019 FINDINGS: Cardiac enlargement. Blunting of the right costophrenic angle. Increase interstitial markings are noted bilaterally. No airspace opacities. Visualized osseous structures appear intact. IMPRESSION: 1. Suspect mild congestive heart failure. 2. Small right pleural effusion. Electronically Signed   By: Kerby Moors M.D.   On: 08/03/2022 14:33    Cardiac Studies   08/04/22 TTE   IMPRESSIONS     1. Left ventricular ejection fraction, by estimation, is 60 to 65%. The  left ventricle has normal function. The left ventricle has no regional  wall motion abnormalities. Left ventricular diastolic function could not  be evaluated.   2. Right ventricular systolic function is normal. The right ventricular  size is normal. There is mildly elevated pulmonary artery systolic  pressure.   3. Left atrial size was severely dilated.   4. Right atrial size was severely dilated.   5. The mitral valve is normal in structure. Moderate to severe mitral  valve regurgitation. No evidence of mitral stenosis.   6. 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   7. The aortic valve is normal in structure. There is mild calcification  of the aortic valve. Aortic valve regurgitation is not visualized. Aortic  valve sclerosis/calcification is present, without any evidence of aortic  stenosis.   8. The inferior vena cava is dilated in size with <50% respiratory   variability, suggesting right atrial pressure of 15 mmHg.   9. Severe biatrial enlargement with mod-severe MR and severe TR  concerning for restrictive physiology.   FINDINGS   Left Ventricle: Left ventricular ejection fraction, by estimation, is 60  to 65%. The left ventricle has normal function. The left ventricle has no  regional wall motion abnormalities. The left ventricular internal cavity  size was normal in size. There is   no left ventricular hypertrophy. Left ventricular diastolic function  could not  be evaluated due to atrial fibrillation. Left ventricular  diastolic function could not be evaluated.   Right Ventricle: The right ventricular size is normal. No increase in  right ventricular wall thickness. Right ventricular systolic function is  normal. There is mildly elevated pulmonary artery systolic pressure. The  tricuspid regurgitant velocity is 2.68   m/s, and with an assumed right atrial pressure of 15 mmHg, the estimated  right ventricular systolic pressure is 09.9 mmHg.   Left Atrium: Left atrial size was severely dilated.   Right Atrium: Right atrial size was severely dilated.   Pericardium: There is no evidence of pericardial effusion.   Mitral Valve: The mitral valve is normal in structure. Moderate to severe  mitral valve regurgitation. No evidence of mitral valve stenosis.   Tricuspid Valve: The tricuspid valve is normal in structure. Tricuspid  valve regurgitation is severe. No evidence of tricuspid stenosis.   Aortic Valve: The aortic valve is normal in structure. There is mild  calcification of the aortic valve. Aortic valve regurgitation is not  visualized. Aortic valve sclerosis/calcification is present, without any  evidence of aortic stenosis. Aortic valve  mean gradient measures 5.0 mmHg. Aortic valve peak gradient measures 10.2  mmHg. Aortic valve area, by VTI measures 2.99 cm.   Pulmonic Valve: The pulmonic valve was normal in structure.  Pulmonic valve  regurgitation is trivial. No evidence of pulmonic stenosis.   Aorta: The aortic root is normal in size and structure.   Venous: The inferior vena cava is dilated in size with less than 50%  respiratory variability, suggesting right atrial pressure of 15 mmHg.   IAS/Shunts: No atrial level shunt detected by color flow Doppler.     Patient Profile     Julia Mcfarland is a 77 y.o. female with a hx of chronic diastolic heart failure, paroxysmal atrial fibrillation with COVID-19, CKD IIIa, obesity who is being seen 08/04/2022 for the evaluation of atrial fibrillation and heart failure at the request of Dr. Bonner Puna.   Assessment & Plan    Paroxysmal atrial fibrillation with RVR   Patient with hx paroxysmal afib first noted with COVID-19 in 2021 now admitted with afib/RVR and dyspnea. Due to mixed compliance with eliquis, no DCCV in the ED. Suspect patient has had higher atrial fibrillation burden than realized given severely dilated atria on echo. Restoration of NSR will be difficult and rate control is preferred strategy.   Patient given Digoxin x3 doses yesterday to assist with rate control. Noted to have improved rates this morning and better BP. Will continue 0.'125mg'$  daily, reassess dig level next week and attempt to up-titrate Metoprolol today. Increase to '75mg'$ . Stop diltiazem. Prefer oral Metoprolol to CCB given low to low-normal BP. Continue Eliquis '5mg'$  BID.   Acute on chronic HFpEF, diastolic dysfunction Severe tricuspid regurgitation Moderate to Severe MR   Patient with LVEF 60-65%, severe TR, moderate to severe MR (both valves structurally normal), severe LA and RA dilation, significant IVC dilation on echocardiogram completed today. BNP elevated to 284.4. Possible atrial functional MR/TR given suspicion for longstanding paroxysmal afib, however, this could also be a due to restrictive disease. She continues with IV diuresis and net output listed as 4.076L.   Patient  previously taking '60mg'$  lasix PRN at home. Continue IV diuresis this morning. Can transition to oral later today. Would expect her to need daily dosing at discharge. Consider initiation of SGLT2 per HFpEF guidelines. Plan for outpatient cardiac P[facility] to assess for possible amyloid  For questions or updates, please contact [place] Please consult [email or web address] for contact info under        Signed, Lily Kocher, PA-C  08/05/2022, 8:17 AM    I have seen and examined the patient along with Lily Kocher, PA-C .  I have reviewed the chart, notes and new data.  I agree with PA/NP's note.  Key new complaints: Marked subjective improvement.  Denies dyspnea or dizziness or weakness. Key examination changes: Remains in atrial fibrillation with substantial improvement in ventricular rate control.  Still has 3+ lower extremity edema, but this is also improving.  Weight is down about 3 pounds since admission, but doubt that the admission weight was actually accurate.  Diuresis more than 4 L. Key new findings / data: Potassium up to 4.2.  Very slight increase in creatinine level.  PLAN: Placed on daily dose of digoxin 0.125 mg daily and increase metoprolol succinate to 75 mg daily.  Stop intravenous diltiazem.  If rate control remains adequate may be ready for discharge later today, if not reevaluate tomorrow. Continue diuretics.  "Dry weight" uncertain. Benefit from long-term treatment with SGLT2 inhibitor.  Need to clarify whether we can provide patient assistance.  Can be started at follow-up appointment in the office.  Jardiance appears to be preferred (co-pay $45). When PYP technetium 99 tracer is available again will schedule for amyloidosis evaluation with nuclear scintigraphy. Will schedule basic metabolic panel and digoxin level next week,Outpatient follow-up in 2-4 weeks.   Sanda Klein, MD, [place] [phone] 08/05/2022, 10:48 AM

## 2022-08-05 NOTE — TOC Benefit Eligibility Note (Signed)
Patient Teacher, English as a foreign language completed.    The patient is currently admitted and upon discharge could be taking Jardiance 10 mg.  The current 30 day co-pay is $45.00.   The patient is currently admitted and upon discharge could be taking Farxiga 10 mg.  The current 30 day co-pay is $95.00.   The patient is insured through [employer], [place] Patient Advocate Specialist [place] Patient Advocate Team Direct Number: [phone]  Fax: [phone]

## 2022-08-06 DIAGNOSIS — I5033 Acute on chronic diastolic (congestive) heart failure: Secondary | ICD-10-CM | POA: Diagnosis not present

## 2022-08-06 DIAGNOSIS — I4891 Unspecified atrial fibrillation: Secondary | ICD-10-CM | POA: Diagnosis not present

## 2022-08-06 LAB — BASIC METABOLIC PANEL
Anion gap: 7 (ref 5–15)
BUN: 20 mg/dL (ref 8–23)
CO2: 29 mmol/L (ref 22–32)
Calcium: 8.8 mg/dL — ABNORMAL LOW (ref 8.9–10.3)
Chloride: 104 mmol/L (ref 98–111)
Creatinine, Ser: 1.2 mg/dL — ABNORMAL HIGH (ref 0.44–1.00)
GFR, Estimated: 47 mL/min — ABNORMAL LOW (ref >60)
Glucose, Bld: 160 mg/dL — ABNORMAL HIGH (ref 70–99)
Potassium: 3.5 mmol/L (ref 3.5–5.1)
Sodium: 140 mmol/L (ref 135–145)

## 2022-08-06 LAB — CBC
HCT: 37.2 % (ref 36.0–46.0)
Hemoglobin: 12.2 g/dL (ref 12.0–15.0)
MCH: 30.7 pg (ref 26.0–34.0)
MCHC: 32.8 g/dL (ref 30.0–36.0)
MCV: 93.7 fL (ref 80.0–100.0)
Platelets: 239 10*3/uL (ref 150–400)
RBC: 3.97 MIL/uL (ref 3.87–5.11)
RDW: 13.6 % (ref 11.5–15.5)
WBC: 5.5 10*3/uL (ref 4.0–10.5)
nRBC: 0 % (ref 0.0–0.2)

## 2022-08-06 MED ORDER — FUROSEMIDE 40 MG PO TABS
60.0000 mg | ORAL_TABLET | Freq: Every day | ORAL | Status: DC
  Administered 2022-08-06: 60 mg via ORAL
  Filled 2022-08-06: qty 1

## 2022-08-06 MED ORDER — EMPAGLIFLOZIN 10 MG PO TABS
10.0000 mg | ORAL_TABLET | Freq: Every day | ORAL | Status: DC
  Administered 2022-08-06: 10 mg via ORAL
  Filled 2022-08-06: qty 1

## 2022-08-06 MED ORDER — METOPROLOL SUCCINATE ER 25 MG PO TB24: 75.0000 mg | ORAL_TABLET | Freq: Every day | ORAL | 0 refills | Status: DC

## 2022-08-06 MED ORDER — DIGOXIN 125 MCG PO TABS: 0.1250 mg | ORAL_TABLET | Freq: Every day | ORAL | 0 refills | Status: DC

## 2022-08-06 MED ORDER — EMPAGLIFLOZIN 10 MG PO TABS: 10.0000 mg | ORAL_TABLET | Freq: Every day | ORAL | 0 refills | Status: DC

## 2022-08-06 NOTE — Discharge Summary (Signed)
Physician Discharge Summary   [name] MRN: [id number] DOB: 18-Feb-1946  Admit date:     08/03/2022  Discharge date: 08/06/22  Discharge Physician: Julia Mcfarland   PCP: Julia Mile, NP   Recommendations at discharge:  Follow up with cardiology 2/20 and PCP on 2/26.  Monitor heart rate. Increased metoprolol '25mg'$  > '75mg'$  daily, started digoxin 0.'125mg'$  daily Recheck digoxin level next week per cardiology Monitor volume status, weight down 120kg > 113.5kg at discharge. Restarted home lasix '60mg'$  daily and started jardiance '10mg'$ . Recommend PYP scan for amyloidosis evaluation once tracer available.   Discharge Diagnoses: Principal Problem:   Atrial fibrillation with RVR (HCC) Active Problems:   Acute on chronic diastolic CHF (congestive heart failure) (HCC)   Stage 3a chronic kidney disease (CKD) [facility])  Hospital Course: Julia Mcfarland is a 77 y.o. female with a history of AFib, chronic HFpEF, stage IIIa CKD, morbid obesity who presented to the ED at [facility] 1/31 as recommended by her cardiologist's office when she reported leg swelling and shortness of breath gradually worsening associated with palpitations. She takes metoprolol, eliquis, and lasix irregularly. She was in AFib with rate of 148bpm and appeared volume overloaded with BNP 284, pulmonary edema on CXR. Diltiazem bolus and infusion started and the patient was transferred to [facility] for acute HFpEF and AFib with RVR. Given IV lasix, home metoprolol and weaned ultimately off diltiazem infusion. Echocardiogram showed severe biatrial enlargement with mod-severe MR, severe TR, dilated/plethoric IVC, reported concern for restrictive physiology. Cardiology consulted. With improved rate control and volume status, patient discharged in stable condition 2/3 with cardiology and PCP follow up in place. See details below.  Assessment and Plan: Acute hypoxic respiratory failure: due to acute CHF, resolved.   Acute on chronic HFpEF:  Suspect precipitated by rapid AFib and nonadherence (both to beta blocker and lasix).  - Stable preserved LVSF by echo, though restrictive physiology mentioned with severe BAE, MR, TR. PYP scan to be performed after discharge, cardiology follow up to be arranged. - Planning to continue lasix '60mg'$  daily dose.  - Repeat BMP at follow up - Strict I/O, weights.    Atrial fibrillation with RVR: Rate improving, though remained on dilt gtt overnight.  - Augmented metoprolol per cardiology. Diltiazem infusion is stopped (CCB less preferred with low-to-low normal EF).  - Restarted eliquis    Stage IIIa CKD: Stable overall - Monitor at f/u   Hypokalemia:  - Supplemented    Morbid obesity: Body mass index is 48.87 kg/m (pended).    Consultants: Cardiology Procedures performed: Echo  Disposition: Home Diet recommendation:  Discharge Diet Orders (From admission, onward)     Start     Ordered   08/06/22 0000  Diet - low sodium heart healthy        08/06/22 1030           Cardiac diet DISCHARGE MEDICATION: Allergies as of 08/06/2022   No Known Allergies      Medication List     TAKE these medications    albuterol 108 (90 Base) MCG/ACT inhaler Commonly known as: VENTOLIN HFA INHALE 2 PUFFS INTO LUNGS EVERY 6 HOURS AS NEEDED FOR WHEEZING OR SHORTNESS OF BREATH What changed: See the new instructions.   apixaban 5 MG Tabs tablet Commonly known as: ELIQUIS Take 1 tablet (5 mg total) by mouth 2 (two) times daily.   digoxin 0.125 MG tablet Commonly known as: LANOXIN Take 1 tablet (0.125 mg total) by mouth daily. Start taking  on: August 07, 2022   empagliflozin 10 MG Tabs tablet Commonly known as: JARDIANCE Take 1 tablet (10 mg total) by mouth daily. Start taking on: August 07, 2022   furosemide 40 MG tablet Commonly known as: LASIX Take 1.5 tablets (60 mg total) by mouth daily.   hydroxypropyl methylcellulose / hypromellose 2.5 % ophthalmic solution Commonly known as:  ISOPTO TEARS / GONIOVISC Place 1 drop into both eyes daily as needed for dry eyes.   metoprolol succinate 25 MG 24 hr tablet Commonly known as: TOPROL-XL Take 3 tablets (75 mg total) by mouth daily. What changed: See the new instructions.   polyethylene glycol 17 g packet Commonly known as: MIRALAX / GLYCOLAX Take 17 g by mouth daily as needed for mild constipation.   vitamin B-12 500 MCG tablet Commonly known as: CYANOCOBALAMIN Take 500 mcg by mouth daily.   vitamin C 1000 MG tablet Take 1,000 mg by mouth daily.   Vitamin D3 125 MCG (5000 UT) Caps Take 5,000 Units by mouth daily.        Follow-up Information      [facility] [place]. Go in 17 day(s).   Specialty: Cardiology Why: Hospital follow up 08/23/2022 @ 2 pm PLEASE bring a current medication list to appointment FREE valet parking, Entrance C, off [employer] information: [address] [id number] [place] [phone]               Discharge Exam: Filed Weights   08/04/22 0029 08/05/22 0343 08/06/22 0345  Weight: (P) 120 kg 117.5 kg 113.5 kg  BP 110/81   Pulse (!) 112   Temp 98.3 F (36.8 C) (Oral)   Resp 16   Ht (P) 5' (1.524 m)   Wt 113.5 kg   SpO2 96%   BMI (P) 48.87 kg/m   Pleasant obese female in no distress asking about when she can go back to work at [employer].  Clear, nonlabored Irreg irreg, rate ~100bpm prior to getting AM meds today. No MRG. Trace pitting/nonpitting as well edema Alert, oriented, non focal. Ambulating independently.  Condition at discharge: stable  The results of significant diagnostics from this hospitalization (including imaging, microbiology, ancillary and laboratory) are listed below for reference.   Imaging Studies: ECHOCARDIOGRAM COMPLETE  Result Date: 08/04/2022    ECHOCARDIOGRAM REPORT   Patient Name:   Julia Mcfarland Date of Exam: 08/04/2022 Medical Rec #:  [id number]       Height:       62.0 in Accession #:    [id number]     Weight:       261.9 lb Date of Birth:  [birth year]      BSA:          2.144 m Patient Age:    44 years       BP:           96/66 mmHg Patient Gender: F              HR:           99 bpm. Exam Location:  Inpatient Procedure: 2D Echo, Cardiac Doppler and Color Doppler Indications:    Atrial Fibrillation I48.91, CHF-Acute Diastolic B71.69  History:        Patient has prior history of Echocardiogram examinations, most                 recent 03/16/2020. CHF; Arrythmias:Atrial Fibrillation. CKD.  Sonographer:    Tobie Poet  Tawni Millers Referring Phys: [id number] TIMOTHY S OPYD IMPRESSIONS  1. Left ventricular ejection fraction, by estimation, is 60 to 65%. The left ventricle has normal function. The left ventricle has no regional wall motion abnormalities. Left ventricular diastolic function could not be evaluated.  2. Right ventricular systolic function is normal. The right ventricular size is normal. There is mildly elevated pulmonary artery systolic pressure.  3. Left atrial size was severely dilated.  4. Right atrial size was severely dilated.  5. The mitral valve is normal in structure. Moderate to severe mitral valve regurgitation. No evidence of mitral stenosis.  6. 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  7. The aortic valve is normal in structure. There is mild calcification of the aortic valve. Aortic valve regurgitation is not visualized. Aortic valve sclerosis/calcification is present, without any evidence of aortic stenosis.  8. The inferior vena cava is dilated in size with <50% respiratory variability, suggesting right atrial pressure of 15 mmHg.  9. Severe biatrial enlargement with mod-severe MR and severe TR concerning for restrictive physiology. FINDINGS  Left Ventricle: Left ventricular ejection fraction, by estimation, is 60 to 65%. The left ventricle has normal function. The left ventricle has no regional wall motion abnormalities. The left ventricular  internal cavity size was normal in size. There is  no left ventricular hypertrophy. Left ventricular diastolic function could not be evaluated due to atrial fibrillation. Left ventricular diastolic function could not be evaluated. Right Ventricle: The right ventricular size is normal. No increase in right ventricular wall thickness. Right ventricular systolic function is normal. There is mildly elevated pulmonary artery systolic pressure. The tricuspid regurgitant velocity is 2.68  m/s, and with an assumed right atrial pressure of 15 mmHg, the estimated right ventricular systolic pressure is 54.2 mmHg. Left Atrium: Left atrial size was severely dilated. Right Atrium: Right atrial size was severely dilated. Pericardium: There is no evidence of pericardial effusion. Mitral Valve: The mitral valve is normal in structure. Moderate to severe mitral valve regurgitation. No evidence of mitral valve stenosis. Tricuspid Valve: The tricuspid valve is normal in structure. 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 No evidence of tricuspid stenosis. Aortic Valve: The aortic valve is normal in structure. There is mild calcification of the aortic valve. Aortic valve regurgitation is not visualized. Aortic valve sclerosis/calcification is present, without any evidence of aortic stenosis. Aortic valve mean gradient measures 5.0 mmHg. Aortic valve peak gradient measures 10.2 mmHg. Aortic valve area, by VTI measures 2.99 cm. Pulmonic Valve: The pulmonic valve was normal in structure. Pulmonic valve regurgitation is trivial. No evidence of pulmonic stenosis. Aorta: The aortic root is normal in size and structure. Venous: The inferior vena cava is dilated in size with less than 50% respiratory variability, suggesting right atrial pressure of 15 mmHg. IAS/Shunts: No atrial level shunt detected by color flow Doppler.  LEFT VENTRICLE PLAX 2D LVIDd:         5.40 cm   Diastology LVIDs:         3.40 cm   LV e' medial:    7.62 cm/s LV PW:          1.00 cm   LV E/e' medial:  13.4 LV IVS:        1.00 cm   LV e' lateral:   8.59 cm/s LVOT diam:     2.20 cm   LV E/e' lateral: 11.9 LV SV:         78 LV SV Index:   36 LVOT Area:     3.80  cm  RIGHT VENTRICLE             IVC RV S prime:     11.10 cm/s  IVC diam: 3.00 cm TAPSE (M-mode): 1.8 cm LEFT ATRIUM           Index        RIGHT ATRIUM           Index LA diam:      4.00 cm 1.87 cm/m   RA Area:     24.50 cm LA Vol (A2C): 83.5 ml 38.95 ml/m  RA Volume:   79.50 ml  37.08 ml/m LA Vol (A4C): 82.3 ml 38.39 ml/m  AORTIC VALVE AV Area (Vmax):    3.25 cm AV Area (Vmean):   3.14 cm AV Area (VTI):     2.99 cm AV Vmax:           160.00 cm/s AV Vmean:          106.000 cm/s AV VTI:            0.260 m AV Peak Grad:      10.2 mmHg AV Mean Grad:      5.0 mmHg LVOT Vmax:         136.67 cm/s LVOT Vmean:        87.633 cm/s LVOT VTI:          0.205 m LVOT/AV VTI ratio: 0.79  AORTA Ao Root diam: 3.20 cm Ao Asc diam:  3.40 cm MR Peak grad: 88.7 mmHg     TRICUSPID VALVE MR Mean grad: 63.0 mmHg     TR Peak grad:   28.7 mmHg MR Vmax:      471.00 cm/s   TR Vmax:        268.00 cm/s MR Vmean:     374.0 cm/s MV E velocity: 102.00 cm/s  SHUNTS                             Systemic VTI:  0.20 m                             Systemic Diam: 2.20 cm Glori Bickers MD Electronically signed by Glori Bickers MD Signature Date/Time: 08/04/2022/11:36:50 AM    Final    US Venous Img Lower Unilateral Left  Result Date: 08/03/2022 CLINICAL DATA:  Left lower extremity pain and edema for the past 2 days. Evaluate for DVT. EXAM: LEFT LOWER EXTREMITY VENOUS DOPPLER ULTRASOUND TECHNIQUE: Gray-scale sonography with graded compression, as well as color Doppler and duplex ultrasound were performed to evaluate the lower extremity deep venous systems from the level of the common femoral vein and including the common femoral, femoral, profunda femoral, popliteal and calf veins including the posterior tibial, peroneal and gastrocnemius veins when  visible. The superficial great saphenous vein was also interrogated. Spectral Doppler was utilized to evaluate flow at rest and with distal augmentation maneuvers in the common femoral, femoral and popliteal veins. COMPARISON:  None Available. FINDINGS: Contralateral Common Femoral Vein: Respiratory phasicity is normal and symmetric with the symptomatic side. No evidence of thrombus. Normal compressibility. Common Femoral Vein: No evidence of thrombus. Normal compressibility, respiratory phasicity and response to augmentation. Saphenofemoral Junction: No evidence of thrombus. Normal compressibility and flow on color Doppler imaging. Profunda Femoral Vein: No evidence of thrombus. Normal compressibility and flow on color Doppler imaging. Femoral Vein: No evidence of thrombus. Normal compressibility,  respiratory phasicity and response to augmentation. Popliteal Vein: No evidence of thrombus. Normal compressibility, respiratory phasicity and response to augmentation. Calf Veins: No evidence of thrombus. Normal compressibility and flow on color Doppler imaging. Superficial Great Saphenous Vein: No evidence of thrombus. Normal compressibility. Other Findings:  None. IMPRESSION: No evidence of DVT within the left lower extremity. Electronically Signed   By: Sandi Mariscal M.D.   On: 08/03/2022 17:18   DG Chest Port 1 View  Result Date: 08/03/2022 CLINICAL DATA:  Shortness of breath. EXAM: PORTABLE CHEST 1 VIEW COMPARISON:  11/25/2019 FINDINGS: Cardiac enlargement. Blunting of the right costophrenic angle. Increase interstitial markings are noted bilaterally. No airspace opacities. Visualized osseous structures appear intact. IMPRESSION: 1. Suspect mild congestive heart failure. 2. Small right pleural effusion. Electronically Signed   By: Kerby Moors M.D.   On: 08/03/2022 14:33    Microbiology: Results for orders placed or performed during the hospital encounter of 08/28/20  SARS CORONAVIRUS 2 (TAT 6-24 HRS)  Nasopharyngeal Nasopharyngeal Swab     Status: None   Collection Time: 08/28/20 10:41 AM   Specimen: Nasopharyngeal Swab  Result Value Ref Range Status   SARS Coronavirus 2 NEGATIVE NEGATIVE Final    Comment: (NOTE) SARS-CoV-2 target nucleic acids are NOT DETECTED.  The SARS-CoV-2 RNA is generally detectable in upper and lower respiratory specimens during the acute phase of infection. Negative results do not preclude SARS-CoV-2 infection, do not rule out co-infections with other pathogens, and should not be used as the sole basis for treatment or other patient management decisions. Negative results must be combined with clinical observations, patient history, and epidemiological information. The expected result is Negative.  Fact Sheet for Patients: SugarRoll.be  Fact Sheet for Healthcare Providers: https://www.woods-mathews.com/  This test is not yet approved or cleared by the [place] FDA and  has been authorized for detection and/or diagnosis of SARS-CoV-2 by FDA under an Emergency Use Authorization (EUA). This EUA will remain  in effect (meaning this test can be used) for the duration of the COVID-19 declaration under Se ction 564(b)(1) of the Act, 21 U.S.C. section 360bbb-3(b)(1), unless the authorization is terminated or revoked sooner.  Performed at [facility], [place] [address], [place], [place] [address]     Labs: CBC: Recent Labs  Lab 08/03/22 1408 08/04/22 0205 08/05/22 0432 08/06/22 0156  WBC 4.9 5.1 5.9 5.5  NEUTROABS 2.8  --   --   --   HGB 13.2 12.1 12.4 12.2  HCT 40.7 37.6 39.3 37.2  MCV 95.3 95.2 95.9 93.7  PLT 230 219 239 017   Basic Metabolic Panel: Recent Labs  Lab 08/03/22 1408 08/04/22 0205 08/05/22 0432 08/06/22 0156  NA 147* 142 140 140  K 3.6 3.3* 4.2 3.5  CL 110 108 107 104  CO2 '28 25 25 29  '$ GLUCOSE 142* 130* 136* 160*  BUN '19 12 14 20  '$ CREATININE 1.06* 0.92 1.08* 1.20*   CALCIUM 9.3 8.6* 8.9 8.8*  MG 2.0  --   --   --    Liver Function Tests: No results for input(s): "AST", "ALT", "ALKPHOS", "BILITOT", "PROT", "ALBUMIN" in the last 168 hours. CBG: No results for input(s): "GLUCAP" in the last 168 hours.  Discharge time spent: greater than 30 minutes.  Signed: Patrecia Pour, MD [facility] 08/06/2022

## 2022-08-06 NOTE — Progress Notes (Addendum)
Rounding Note    Patient Name: Julia Mcfarland Date of Encounter: 08/06/2022  [place] Cardiologist: Sanda Klein, MD   Subjective   She was able to ambulate to the bathroom. She is ready to go home.  Inpatient Medications    Scheduled Meds:  apixaban  5 mg Oral BID   digoxin  0.125 mg Oral Daily   metoprolol succinate  75 mg Oral Daily   sodium chloride flush  3 mL Intravenous Q12H   Continuous Infusions:   PRN Meds: acetaminophen **OR** acetaminophen, albuterol, senna-docusate   Vital Signs    Vitals:   08/05/22 1504 08/05/22 1902 08/06/22 0345 08/06/22 0352  BP: 130/83 124/84 117/84   Pulse: (!) 115 64 78   Resp: '16 19 20 20  '$ Temp: 98.2 F (36.8 C) 99 F (37.2 C) 98.9 F (37.2 C)   TempSrc: Oral Oral Oral Oral  SpO2: 94% 95% 96%   Weight:   113.5 kg   Height:        Intake/Output Summary (Last 24 hours) at 08/06/2022 0815 Last data filed at 08/06/2022 0403 Gross per 24 hour  Intake 960 ml  Output 4850 ml  Net -3890 ml      08/06/2022    3:45 AM 08/05/2022    3:43 AM 08/04/2022   12:29 AM  Last 3 Weights  Weight (lbs) 250 lb 3.6 oz 259 lb 0.7 oz 264 lb 8.8 oz  Weight (kg) 113.5 kg 117.5 kg 120 kg      Telemetry    Atrial fibrillation, rate controlled - Personally Reviewed  ECG    No new tracing - Personally Reviewed  Physical Exam   GEN: No acute distress.   Neck: prominent C-V wave  Cardiac: IRRR, no murmurs, rubs, or gallops.  Respiratory: Clear to auscultation bilaterally. GI: Soft, nontender, non-distended  MS: Improved but still significant bilateral soft lower extremity edema Neuro:  Nonfocal  Psych: Normal affect   Labs    High Sensitivity Troponin:   Recent Labs  Lab 08/03/22 1408 08/03/22 1655  TROPONINIHS 5 4     Chemistry Recent Labs  Lab 08/03/22 1408 08/04/22 0205 08/05/22 0432 08/06/22 0156  NA 147* 142 140 140  K 3.6 3.3* 4.2 3.5  CL 110 108 107 104  CO2 '28 25 25 29  '$ GLUCOSE 142* 130* 136* 160*   BUN '19 12 14 20  '$ CREATININE 1.06* 0.92 1.08* 1.20*  CALCIUM 9.3 8.6* 8.9 8.8*  MG 2.0  --   --   --   GFRNONAA 55* >60 54* 47*  ANIONGAP '9 9 8 7    '$ Lipids No results for input(s): "CHOL", "TRIG", "HDL", "LABVLDL", "LDLCALC", "CHOLHDL" in the last 168 hours.  Hematology Recent Labs  Lab 08/04/22 0205 08/05/22 0432 08/06/22 0156  WBC 5.1 5.9 5.5  RBC 3.95 4.10 3.97  HGB 12.1 12.4 12.2  HCT 37.6 39.3 37.2  MCV 95.2 95.9 93.7  MCH 30.6 30.2 30.7  MCHC 32.2 31.6 32.8  RDW 13.7 14.0 13.6  PLT 219 239 239   Thyroid  Recent Labs  Lab 08/03/22 1408  TSH 0.778    BNP Recent Labs  Lab 08/03/22 1408  BNP 284.4*    DDimer No results for input(s): "DDIMER" in the last 168 hours.   Radiology    ECHOCARDIOGRAM COMPLETE  Result Date: 08/04/2022    ECHOCARDIOGRAM REPORT   Patient Name:   Julia Mcfarland Date of Exam: 08/04/2022 Medical Rec #:  [id number]  Height:       62.0 in Accession #:    [id number]     Weight:       261.9 lb Date of Birth:  [birth year]      BSA:          2.144 m Patient Age:    78 years       BP:           96/66 mmHg Patient Gender: F              HR:           99 bpm. Exam Location:  Inpatient Procedure: 2D Echo, Cardiac Doppler and Color Doppler Indications:    Atrial Fibrillation I48.91, CHF-Acute Diastolic M62.94  History:        Patient has prior history of Echocardiogram examinations, most                 recent 03/16/2020. CHF; Arrythmias:Atrial Fibrillation. CKD.  Sonographer:    Ronny Flurry Referring Phys: [id number] [place]  1. Left ventricular ejection fraction, by estimation, is 60 to 65%. The left ventricle has normal function. The left ventricle has no regional wall motion abnormalities. Left ventricular diastolic function could not be evaluated.  2. Right ventricular systolic function is normal. The right ventricular size is normal. There is mildly elevated pulmonary artery systolic pressure.  3. Left atrial size was severely dilated.   4. Right atrial size was severely dilated.  5. The mitral valve is normal in structure. Moderate to severe mitral valve regurgitation. No evidence of mitral stenosis.  6. 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  7. The aortic valve is normal in structure. There is mild calcification of the aortic valve. Aortic valve regurgitation is not visualized. Aortic valve sclerosis/calcification is present, without any evidence of aortic stenosis.  8. The inferior vena cava is dilated in size with <50% respiratory variability, suggesting right atrial pressure of 15 mmHg.  9. Severe biatrial enlargement with mod-severe MR and severe TR concerning for restrictive physiology. FINDINGS  Left Ventricle: Left ventricular ejection fraction, by estimation, is 60 to 65%. The left ventricle has normal function. The left ventricle has no regional wall motion abnormalities. The left ventricular internal cavity size was normal in size. There is  no left ventricular hypertrophy. Left ventricular diastolic function could not be evaluated due to atrial fibrillation. Left ventricular diastolic function could not be evaluated. Right Ventricle: The right ventricular size is normal. No increase in right ventricular wall thickness. Right ventricular systolic function is normal. There is mildly elevated pulmonary artery systolic pressure. The tricuspid regurgitant velocity is 2.68  m/s, and with an assumed right atrial pressure of 15 mmHg, the estimated right ventricular systolic pressure is 35.4 mmHg. Left Atrium: Left atrial size was severely dilated. Right Atrium: Right atrial size was severely dilated. Pericardium: There is no evidence of pericardial effusion. Mitral Valve: The mitral valve is normal in structure. Moderate to severe mitral valve regurgitation. No evidence of mitral valve stenosis. Tricuspid Valve: The tricuspid valve is normal in structure. 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 No evidence of tricuspid stenosis. Aortic  Valve: The aortic valve is normal in structure. There is mild calcification of the aortic valve. Aortic valve regurgitation is not visualized. Aortic valve sclerosis/calcification is present, without any evidence of aortic stenosis. Aortic valve mean gradient measures 5.0 mmHg. Aortic valve peak gradient measures 10.2 mmHg. Aortic valve area, by VTI measures 2.99 cm. Pulmonic Valve: The pulmonic valve was  normal in structure. Pulmonic valve regurgitation is trivial. No evidence of pulmonic stenosis. Aorta: The aortic root is normal in size and structure. Venous: The inferior vena cava is dilated in size with less than 50% respiratory variability, suggesting right atrial pressure of 15 mmHg. IAS/Shunts: No atrial level shunt detected by color flow Doppler.  LEFT VENTRICLE PLAX 2D LVIDd:         5.40 cm   Diastology LVIDs:         3.40 cm   LV e' medial:    7.62 cm/s LV PW:         1.00 cm   LV E/e' medial:  13.4 LV IVS:        1.00 cm   LV e' lateral:   8.59 cm/s LVOT diam:     2.20 cm   LV E/e' lateral: 11.9 LV SV:         78 LV SV Index:   36 LVOT Area:     3.80 cm  RIGHT VENTRICLE             IVC RV S prime:     11.10 cm/s  IVC diam: 3.00 cm TAPSE (M-mode): 1.8 cm LEFT ATRIUM           Index        RIGHT ATRIUM           Index LA diam:      4.00 cm 1.87 cm/m   RA Area:     24.50 cm LA Vol (A2C): 83.5 ml 38.95 ml/m  RA Volume:   79.50 ml  37.08 ml/m LA Vol (A4C): 82.3 ml 38.39 ml/m  AORTIC VALVE AV Area (Vmax):    3.25 cm AV Area (Vmean):   3.14 cm AV Area (VTI):     2.99 cm AV Vmax:           160.00 cm/s AV Vmean:          106.000 cm/s AV VTI:            0.260 m AV Peak Grad:      10.2 mmHg AV Mean Grad:      5.0 mmHg LVOT Vmax:         136.67 cm/s LVOT Vmean:        87.633 cm/s LVOT VTI:          0.205 m LVOT/AV VTI ratio: 0.79  AORTA Ao Root diam: 3.20 cm Ao Asc diam:  3.40 cm MR Peak grad: 88.7 mmHg     TRICUSPID VALVE MR Mean grad: 63.0 mmHg     TR Peak grad:   28.7 mmHg MR Vmax:      471.00 cm/s    TR Vmax:        268.00 cm/s MR Vmean:     374.0 cm/s MV E velocity: 102.00 cm/s  SHUNTS                             Systemic VTI:  0.20 m                             Systemic Diam: 2.20 cm Glori Bickers MD Electronically signed by Glori Bickers MD Signature Date/Time: 08/04/2022/11:36:50 AM    Final     Cardiac Studies   08/04/22 TTE   IMPRESSIONS     1. Left ventricular ejection fraction, by estimation, is 60  to 65%. The  left ventricle has normal function. The left ventricle has no regional  wall motion abnormalities. Left ventricular diastolic function could not  be evaluated.   2. Right ventricular systolic function is normal. The right ventricular  size is normal. There is mildly elevated pulmonary artery systolic  pressure.   3. Left atrial size was severely dilated.   4. Right atrial size was severely dilated.   5. The mitral valve is normal in structure. Moderate to severe mitral  valve regurgitation. No evidence of mitral stenosis.   6. 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   7. The aortic valve is normal in structure. There is mild calcification  of the aortic valve. Aortic valve regurgitation is not visualized. Aortic  valve sclerosis/calcification is present, without any evidence of aortic  stenosis.   8. The inferior vena cava is dilated in size with <50% respiratory  variability, suggesting right atrial pressure of 15 mmHg.   9. Severe biatrial enlargement with mod-severe MR and severe TR  concerning for restrictive physiology.   FINDINGS   Left Ventricle: Left ventricular ejection fraction, by estimation, is 60  to 65%. The left ventricle has normal function. The left ventricle has no  regional wall motion abnormalities. The left ventricular internal cavity  size was normal in size. There is   no left ventricular hypertrophy. Left ventricular diastolic function  could not be evaluated due to atrial fibrillation. Left ventricular  diastolic function could not  be evaluated.   Right Ventricle: The right ventricular size is normal. No increase in  right ventricular wall thickness. Right ventricular systolic function is  normal. There is mildly elevated pulmonary artery systolic pressure. The  tricuspid regurgitant velocity is 2.68   m/s, and with an assumed right atrial pressure of 15 mmHg, the estimated  right ventricular systolic pressure is 37.1 mmHg.   Left Atrium: Left atrial size was severely dilated.   Right Atrium: Right atrial size was severely dilated.   Pericardium: There is no evidence of pericardial effusion.   Mitral Valve: The mitral valve is normal in structure. Moderate to severe  mitral valve regurgitation. No evidence of mitral valve stenosis.   Tricuspid Valve: The tricuspid valve is normal in structure. Tricuspid  valve regurgitation is severe. No evidence of tricuspid stenosis.   Aortic Valve: The aortic valve is normal in structure. There is mild  calcification of the aortic valve. Aortic valve regurgitation is not  visualized. Aortic valve sclerosis/calcification is present, without any  evidence of aortic stenosis. Aortic valve  mean gradient measures 5.0 mmHg. Aortic valve peak gradient measures 10.2  mmHg. Aortic valve area, by VTI measures 2.99 cm.   Pulmonic Valve: The pulmonic valve was normal in structure. Pulmonic valve  regurgitation is trivial. No evidence of pulmonic stenosis.   Aorta: The aortic root is normal in size and structure.   Venous: The inferior vena cava is dilated in size with less than 50%  respiratory variability, suggesting right atrial pressure of 15 mmHg.   IAS/Shunts: No atrial level shunt detected by color flow Doppler.     Patient Profile     Julia Mcfarland is a 77 y.o. female with a hx of chronic diastolic heart failure, paroxysmal atrial fibrillation with COVID-19, CKD IIIa, obesity who is being seen 08/04/2022 for the evaluation of atrial fibrillation and heart failure at the  request of Dr. Bonner Puna.   Assessment & Plan    Paroxysmal atrial fibrillation with RVR   Patient with  hx paroxysmal afib first noted with COVID-19 in 2021 now admitted with afib/RVR and dyspnea. Due to mixed compliance with eliquis, no DCCV in the ED. Suspect patient has had higher atrial fibrillation burden than realized given severely dilated atria on echo. Restoration of NSR will be difficult and rate control is preferred strategy. Avoid dilt.  In rate controlled afib Patient given Digoxin x3 doses 2/1 to assist with rate control. Noted to have improved rates this morning and better BP. Will continue 0.'125mg'$  daily, reassess dig level next week and attempt to up-titrate Metoprolol today. Increase to '75mg'$ . Stop diltiazem. Prefer oral Metoprolol to CCB given low to low-normal BP. Continue Eliquis '5mg'$  BID.   Acute on chronic HFpEF, diastolic dysfunction Severe tricuspid regurgitation Moderate to Severe MR   Patient with LVEF 60-65%, severe TR, moderate to severe MR (both valves structurally normal), severe LA and RA dilation, significant IVC dilation on echocardiogram completed today. BNP elevated to 284.4. Possible atrial functional MR/TR given suspicion for longstanding paroxysmal afib, however, this could also be a due to restrictive disease. She has diuresed well , wt 261 to 250 pounds. Net negative 7.96 L   Transitioned to lasix 60 mg daily; was on PRN at home Would expect her to need daily dosing at discharge. Consider initiation of SGLT2 per HFpEF guidelines. Plan for outpatient cardiac PYP to assess for possible amyloid     She is stable for discharge from a cardiac perspective.  For questions or updates, please contact [place] Please consult [email or web address] for contact info under        Signed, Janina Mayo, MD  08/06/2022, 8:15 AM

## 2022-08-09 ENCOUNTER — Other Ambulatory Visit: Payer: Self-pay | Admitting: *Deleted

## 2022-08-09 DIAGNOSIS — E859 Amyloidosis, unspecified: Secondary | ICD-10-CM

## 2022-08-09 LAB — MULTIPLE MYELOMA PANEL, SERUM
Albumin SerPl Elph-Mcnc: 3.5 g/dL (ref 2.9–4.4)
Albumin/Glob SerPl: 1.6 (ref 0.7–1.7)
Alpha 1: 0.3 g/dL (ref 0.0–0.4)
Alpha2 Glob SerPl Elph-Mcnc: 0.5 g/dL (ref 0.4–1.0)
B-Globulin SerPl Elph-Mcnc: 0.8 g/dL (ref 0.7–1.3)
Gamma Glob SerPl Elph-Mcnc: 0.7 g/dL (ref 0.4–1.8)
Globulin, Total: 2.3 g/dL (ref 2.2–3.9)
IgA: 137 mg/dL (ref 64–422)
IgG (Immunoglobin G), Serum: 819 mg/dL (ref 586–1602)
IgM (Immunoglobulin M), Srm: 48 mg/dL (ref 26–217)
Total Protein ELP: 5.8 g/dL — ABNORMAL LOW (ref 6.0–8.5)

## 2022-08-10 ENCOUNTER — Other Ambulatory Visit: Payer: Self-pay | Admitting: Student

## 2022-08-10 DIAGNOSIS — Z1231 Encounter for screening mammogram for malignant neoplasm of breast: Secondary | ICD-10-CM

## 2022-08-19 ENCOUNTER — Telehealth (HOSPITAL_COMMUNITY): Payer: Self-pay | Admitting: *Deleted

## 2022-08-19 NOTE — Telephone Encounter (Signed)
Spoke with patient and reminded Julia Mcfarland test on 08/24/22 at 12:30

## 2022-08-23 ENCOUNTER — Telehealth (HOSPITAL_COMMUNITY): Payer: Self-pay

## 2022-08-23 ENCOUNTER — Encounter (HOSPITAL_COMMUNITY): Payer: Medicare HMO

## 2022-08-23 NOTE — Telephone Encounter (Signed)
Called to confirm Heart & Vascular Transitions of Care appointment at 08/24/22. Patient reminded to bring all medications and pill box organizer with them. Confirmed patient has transportation. Gave directions, instructed to utilize [place] parking.  Confirmed appointment prior to ending call.

## 2022-08-24 ENCOUNTER — Encounter (HOSPITAL_COMMUNITY): Payer: Self-pay

## 2022-08-24 ENCOUNTER — Ambulatory Visit (HOSPITAL_BASED_OUTPATIENT_CLINIC_OR_DEPARTMENT_OTHER): Payer: Medicare HMO

## 2022-08-24 ENCOUNTER — Ambulatory Visit (HOSPITAL_COMMUNITY)
Admission: RE | Admit: 2022-08-24 | Discharge: 2022-08-24 | Disposition: A | Discharge status: Home / Self Care | Payer: Medicare HMO | Source: Ambulatory Visit | Attending: Cardiology | Admitting: Cardiology

## 2022-08-24 VITALS — BP 110/80 | HR 138 | Ht 62.0 in | Wt 244.2 lb

## 2022-08-24 DIAGNOSIS — G5600 Carpal tunnel syndrome, unspecified upper limb: Secondary | ICD-10-CM | POA: Insufficient documentation

## 2022-08-24 DIAGNOSIS — I4819 Other persistent atrial fibrillation: Secondary | ICD-10-CM | POA: Diagnosis not present

## 2022-08-24 DIAGNOSIS — Z7901 Long term (current) use of anticoagulants: Secondary | ICD-10-CM | POA: Diagnosis not present

## 2022-08-24 DIAGNOSIS — R0602 Shortness of breath: Secondary | ICD-10-CM | POA: Diagnosis not present

## 2022-08-24 DIAGNOSIS — R002 Palpitations: Secondary | ICD-10-CM | POA: Insufficient documentation

## 2022-08-24 DIAGNOSIS — E859 Amyloidosis, unspecified: Secondary | ICD-10-CM | POA: Insufficient documentation

## 2022-08-24 DIAGNOSIS — I081 Rheumatic disorders of both mitral and tricuspid valves: Secondary | ICD-10-CM | POA: Insufficient documentation

## 2022-08-24 DIAGNOSIS — I130 Hypertensive heart and chronic kidney disease with heart failure and stage 1 through stage 4 chronic kidney disease, or unspecified chronic kidney disease: Secondary | ICD-10-CM | POA: Diagnosis not present

## 2022-08-24 DIAGNOSIS — N1831 Chronic kidney disease, stage 3a: Secondary | ICD-10-CM | POA: Insufficient documentation

## 2022-08-24 DIAGNOSIS — I5032 Chronic diastolic (congestive) heart failure: Secondary | ICD-10-CM | POA: Diagnosis not present

## 2022-08-24 DIAGNOSIS — Z79899 Other long term (current) drug therapy: Secondary | ICD-10-CM | POA: Insufficient documentation

## 2022-08-24 DIAGNOSIS — Z7984 Long term (current) use of oral hypoglycemic drugs: Secondary | ICD-10-CM | POA: Insufficient documentation

## 2022-08-24 LAB — CBC
HCT: 43.7 % (ref 36.0–46.0)
Hemoglobin: 13.7 g/dL (ref 12.0–15.0)
MCH: 30.2 pg (ref 26.0–34.0)
MCHC: 31.4 g/dL (ref 30.0–36.0)
MCV: 96.5 fL (ref 80.0–100.0)
Platelets: 260 10*3/uL (ref 150–400)
RBC: 4.53 MIL/uL (ref 3.87–5.11)
RDW: 13.5 % (ref 11.5–15.5)
WBC: 4.6 10*3/uL (ref 4.0–10.5)
nRBC: 0 % (ref 0.0–0.2)

## 2022-08-24 LAB — BASIC METABOLIC PANEL
Anion gap: 8 (ref 5–15)
BUN: 16 mg/dL (ref 8–23)
CO2: 27 mmol/L (ref 22–32)
Calcium: 9 mg/dL (ref 8.9–10.3)
Chloride: 107 mmol/L (ref 98–111)
Creatinine, Ser: 1.3 mg/dL — ABNORMAL HIGH (ref 0.44–1.00)
GFR, Estimated: 43 mL/min — ABNORMAL LOW (ref >60)
Glucose, Bld: 112 mg/dL — ABNORMAL HIGH (ref 70–99)
Potassium: 4.1 mmol/L (ref 3.5–5.1)
Sodium: 142 mmol/L (ref 135–145)

## 2022-08-24 LAB — DIGOXIN LEVEL: Digoxin Level: 0.4 ng/mL — ABNORMAL LOW (ref 0.8–2.0)

## 2022-08-24 LAB — BRAIN NATRIURETIC PEPTIDE: B Natriuretic Peptide: 180.1 pg/mL — ABNORMAL HIGH (ref 0.0–100.0)

## 2022-08-24 MED ORDER — TECHNETIUM TC 99M PYROPHOSPHATE
21.2000 | Freq: Once | INTRAVENOUS | Status: AC
  Administered 2022-08-24: 21.2 via INTRAVENOUS

## 2022-08-24 MED ORDER — METOPROLOL SUCCINATE ER 25 MG PO TB24: 75.0000 mg | ORAL_TABLET | Freq: Every day | ORAL | 11 refills | Status: DC

## 2022-08-24 MED ORDER — EMPAGLIFLOZIN 10 MG PO TABS: 10.0000 mg | ORAL_TABLET | Freq: Every day | ORAL | 11 refills | Status: DC

## 2022-08-24 MED ORDER — APIXABAN 5 MG PO TABS: 5.0000 mg | ORAL_TABLET | Freq: Two times a day (BID) | ORAL | 11 refills | Status: DC

## 2022-08-24 MED ORDER — POTASSIUM CHLORIDE CRYS ER 20 MEQ PO TBCR: 20.0000 meq | EXTENDED_RELEASE_TABLET | Freq: Every day | ORAL | 11 refills | Status: AC

## 2022-08-24 NOTE — Progress Notes (Signed)
Provided patient with following samples:   Jardiance 10 mg tablets # 14  LN [id number] Exp 3/24   Eliquis 5 mg tablets # 59  LN [id number]    Exp 9/25

## 2022-08-24 NOTE — Progress Notes (Addendum)
HEART & VASCULAR TRANSITION OF CARE CONSULT NOTE     Referring Physician: Dr. Beckie Busing  Primary Care: Cipriano Mile, NP Primary Cardiologist: Sanda Klein, MD  HPI: Referred to clinic by Dr. Phineas Inches, Cardiology, for heart failure consultation.   77 y/o AAF w/ h/o HFpEF, PAF, CKD IIIa and bilateral carpal tunnel syndrome s/p CTR, recently admitted w/ acute CHF and AFib w/ RVR. Echo showed EF 60-65%, severe BAE, mod-severe MR, severe TR and normal RV. Echo concerning for restrictive physiology. She was diuresed w/ IV Lasix and treated w/ metoprolol and digoxin for rate control. Given questionable home compliance w/ Eliquis, DCCV was not attempted. V-rates improved w/ rate control therapy. After diuresis, she was transitioned to 60 mg of PO Lasix and started on GDMT. Given concerns for possible amyloid, multiple myeloma panel was done and negative for M-spike protein. Recommendations were to plan outpatient PYP to assess for TTR amyloid. She was discharged home on 2/3 and referred to [facility] clinic. D/c wt 249 lb.   She presents today for f/u. Wt stable 244 lb. Denies resting dyspnea. NYHA Class II. No palpitations but continues on Afib w/ RVR 138 bpm on EKG. She unfortunately has been taking the wrong dose of metoprolol. Has only been taking 25 mg once daily, opposed to the prescribed dose of 75 mg daily at was provided in her discharge instructions. She says she was confused by the instructions. BP is stable today at 110/80. She reports full compliance w/ all other meds including Eliquis. Denies abnormal bleeding.   She is scheduled to get PYP scan completed this afternoon.     Cardiac Testing  08/04/22 TTE   IMPRESSIONS     1. Left ventricular ejection fraction, by estimation, is 60 to 65%. The  left ventricle has normal function. The left ventricle has no regional  wall motion abnormalities. Left ventricular diastolic function could not  be evaluated.   2. Right ventricular  systolic function is normal. The right ventricular  size is normal. There is mildly elevated pulmonary artery systolic  pressure.   3. Left atrial size was severely dilated.   4. Right atrial size was severely dilated.   5. The mitral valve is normal in structure. Moderate to severe mitral  valve regurgitation. No evidence of mitral stenosis.   6. 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   7. The aortic valve is normal in structure. There is mild calcification  of the aortic valve. Aortic valve regurgitation is not visualized. Aortic  valve sclerosis/calcification is present, without any evidence of aortic  stenosis.   8. The inferior vena cava is dilated in size with <50% respiratory  variability, suggesting right atrial pressure of 15 mmHg.   9. Severe biatrial enlargement with mod-severe MR and severe TR  concerning for restrictive physiology.    Review of Systems: [y] = yes, [ ]$  = no   General: Weight gain [ ]$ ; Weight loss [ ]$ ; Anorexia [ ]$ ; Fatigue [ ]$ ; Fever [ ]$ ; Chills [ ]$ ; Weakness [ ]$   Cardiac: Chest pain/pressure [ ]$ ; Resting SOB [ ]$ ; Exertional SOB [ Y]; Orthopnea [ ]$ ; Pedal Edema [Y ]; Palpitations [ ]$ ; Syncope [ ]$ ; Presyncope [ ]$ ; Paroxysmal nocturnal dyspnea[ ]$   Pulmonary: Cough [ ]$ ; Wheezing[ ]$ ; Hemoptysis[ ]$ ; Sputum [ ]$ ; Snoring [ ]$   GI: Vomiting[ ]$ ; Dysphagia[ ]$ ; Melena[ ]$ ; Hematochezia [ ]$ ; Heartburn[ ]$ ; Abdominal pain [ ]$ ; Constipation [ ]$ ; Diarrhea [ ]$ ; BRBPR [ ]$   GU: Hematuria[ ]$ ;  Dysuria [ ]$ ; Nocturia[ ]$   Vascular: Pain in legs with walking [ ]$ ; Pain in feet with lying flat [ ]$ ; Non-healing sores [ ]$ ; Stroke [ ]$ ; TIA [ ]$ ; Slurred speech [ ]$ ;  Neuro: Headaches[ ]$ ; Vertigo[ ]$ ; Seizures[ ]$ ; Paresthesias[ ]$ ;Blurred vision [ ]$ ; Diplopia [ ]$ ; Vision changes [ ]$   Ortho/Skin: Arthritis [ ]$ ; Joint pain [ ]$ ; Muscle pain [ ]$ ; Joint swelling [ ]$ ; Back Pain [ ]$ ; Rash [ ]$   Psych: Depression[ ]$ ; Anxiety[ ]$   Heme: Bleeding problems [ ]$ ; Clotting disorders [ ]$ ; Anemia [ ]$    Endocrine: Diabetes [ ]$ ; Thyroid dysfunction[ ]$    Past Medical History:  Diagnosis Date   Allergy    Arthritis    Chicken pox    Chronic venous insufficiency    a. Uses furosemide prn.   Degenerative joint disease    Morbid obesity (HCC)    Osteoarthritis    PONV (postoperative nausea and vomiting)     Current Outpatient Medications  Medication Sig Dispense Refill   albuterol (VENTOLIN HFA) 108 (90 Base) MCG/ACT inhaler INHALE 2 PUFFS INTO LUNGS EVERY 6 HOURS AS NEEDED FOR WHEEZING OR SHORTNESS OF BREATH (Patient taking differently: Inhale 2 puffs into the lungs every 6 (six) hours as needed for wheezing or shortness of breath.) 6.7 each 1   apixaban (ELIQUIS) 5 MG TABS tablet Take 1 tablet (5 mg total) by mouth 2 (two) times daily. 60 tablet 11   Ascorbic Acid (VITAMIN C) 1000 MG tablet Take 1,000 mg by mouth daily.     Cholecalciferol (VITAMIN D3) 5000 UNITS CAPS Take 5,000 Units by mouth daily.      digoxin (LANOXIN) 0.125 MG tablet Take 1 tablet (0.125 mg total) by mouth daily. 30 tablet 0   empagliflozin (JARDIANCE) 10 MG TABS tablet Take 1 tablet (10 mg total) by mouth daily. 30 tablet 0   furosemide (LASIX) 40 MG tablet Take 1.5 tablets (60 mg total) by mouth daily. 30 tablet 10   hydroxypropyl methylcellulose / hypromellose (ISOPTO TEARS / GONIOVISC) 2.5 % ophthalmic solution Place 1 drop into both eyes daily as needed for dry eyes.     metoprolol succinate (TOPROL-XL) 25 MG 24 hr tablet Take 3 tablets (75 mg total) by mouth daily. 90 tablet 0   polyethylene glycol (MIRALAX / GLYCOLAX) 17 g packet Take 17 g by mouth daily as needed for mild constipation.     vitamin B-12 (CYANOCOBALAMIN) 500 MCG tablet Take 500 mcg by mouth daily.     No current facility-administered medications for this encounter.    No Known Allergies    Social History   Socioeconomic History   Marital status: Married    Spouse name: Not on file   Number of children: Not on file   Years of  education: Not on file   Highest education level: Not on file  Occupational History   Not on file  Tobacco Use   Smoking status: Never   Smokeless tobacco: Never  Vaping Use   Vaping Use: Never used  Substance and Sexual Activity   Alcohol use: Yes    Comment: occasional   Drug use: No   Sexual activity: Never  Other Topics Concern   Not on file  Social History Narrative   Lives in [place] w/ husband.  Says she's relatively active but does not routinely exercise.   Social Determinants of Health   Financial Resource Strain: Low Risk  (08/05/2022)   Overall Financial Resource Strain (CARDIA)  Difficulty of Paying Living Expenses: Not very hard  Food Insecurity: No Food Insecurity (08/03/2022)   Hunger Vital Sign    Worried About Running Out of Food in the Last Year: Never true    Ran Out of Food in the Last Year: Never true  Transportation Needs: No Transportation Needs (08/03/2022)   PRAPARE - Hydrologist (Medical): No    Lack of Transportation (Non-Medical): No  Physical Activity: Not on file  Stress: Not on file  Social Connections: Not on file  Intimate Partner Violence: Not At Risk (08/03/2022)   Humiliation, Afraid, Rape, and Kick questionnaire    Fear of Current or Ex-Partner: No    Emotionally Abused: No    Physically Abused: No    Sexually Abused: No      Family History  Problem Relation Age of Onset   Arthritis Mother    Heart disease Mother    Hypertension Mother    Diabetes Mother    Stroke Father    Hypertension Father    Stroke Sister    Hypertension Sister    Heart disease Maternal Grandfather    Diabetes Paternal Grandmother    Heart disease Paternal Grandfather     Vitals:   08/24/22 1005  BP: 110/80  Pulse: (!) 138  SpO2: 95%  Weight: 110.8 kg (244 lb 3.2 oz)  Height: 5' 2"$  (1.575 m)    PHYSICAL EXAM: General:  Well appearing, obese. No respiratory difficulty HEENT: normal Neck: supple. JVD 7 cm.  Carotids 2+ bilat; no bruits. No lymphadenopathy or thryomegaly appreciated. Cor: PMI nondisplaced. Irregularly irregular rhythm and tachy rate. 2/6 MR/TR  Lungs: clear Abdomen: soft, nontender, nondistended. No hepatosplenomegaly. No bruits or masses. Good bowel sounds. Extremities: no cyanosis, clubbing, rash, trace b/l ankle edema Neuro: alert & oriented x 3, cranial nerves grossly intact. moves all 4 extremities w/o difficulty. Affect pleasant.  ECG: Atrial fibrillation 137 bpm    ASSESSMENT & PLAN:  1. HFpEF - Echo 2/24 EF 60-65%, normal RV, restrictive physiology  - given concomitant atrial fibrillation and bilateral carpal tunnel syndrome, there is concern for possible infiltrative CM. Multiple Myeloma panel negative for M spike protein. She is scheduled for PYP test today to screen for TTR amyloid.  - NYHA Class II. Wt stable post d/c, down 5 lb. Volume assessment difficult given body habitus. Check BNP today to guide loop diuretic dosing  - Continue Jardiance 10 mg daily  - Continue Lasix 60 mg daily  - Increase Toprol XL to 75 mg daily to help w/ rate control  - Consider Delene Loll or Arlyce Harman next visit if BP allows  - Will plan to have her f/u in [facility] clinic in 2 wks to review results of PYP scan. If suggestive of TTR amyloid, will need referral to the AHF clinic to get started on Tafamidis and conduct gene testing  2. Persistent Atrial Fibrillation  - V-rates uncontrolled, 137 bmp on today's EKG but not symptomatic. BP normotensive. This is in the setting of mistakenly taking lower dose metoprolol than was recommended at hospital d/c - will increase Toprol XL to 75 mg daily as previously recommended  - Continue Digoxin 0.125 mg daily. Check Dig level  - Continue Eliquis 5 mg bid. Denies abnormal bleeding. Check CBC today  - rhythm control will likely be difficult in setting of severe LAE, but will need to attempt DCCV if no spontaneous conversion. Will defer to gen cards. Needs 3  wks of uninterrupted  a/c w/ Eliquis prior to attempt   - reports h/o snoring. Recommend sleep study evaluation. She will d/w her primary cardiologist and PCP   3. Mitral Regurgitation  - mod-severe on echo - suspect functional atrial MR, severe LAE - continue optimization of HF and Afib  - if she continues to have frequent recurrence of Afib and HF exacerbations, will need to consider referral to structural team for MitraClip evaluation   4. Tricuspid Regurgitation - severe on echo  - suspect functional atrial TR, severe RAE   5. Stage IIIa CKD - on Jardiance  - check BMP today    Referred to HFSW (PCP, Medications, Transportation, ETOH Abuse, Drug Abuse, Insurance, Financial ): No Refer to Pharmacy: No Refer to Home Health: No Refer to Advanced Heart Failure Clinic: Pending PYP result Refer to General Cardiology: Yes (Dr. Sallyanne Kuster)  Follow up  in 2 wks in [facility] clinic

## 2022-08-24 NOTE — Patient Instructions (Addendum)
Increase Metoprolol to 75 mg (3 tablets) daily. Labs today - will call you if abnormal. Return to Heart Failure TOC Clinic in 2 weeks. Please call us at [phone] if any questions or concerns.

## 2022-08-24 NOTE — Addendum Note (Signed)
Encounter addended by: Lezlie Octave, RN on: 08/24/2022 11:55 AM  Actions taken: Clinical Note Signed

## 2022-08-26 ENCOUNTER — Telehealth: Payer: Self-pay | Admitting: Emergency Medicine

## 2022-08-26 ENCOUNTER — Encounter: Payer: Self-pay | Admitting: Emergency Medicine

## 2022-08-26 DIAGNOSIS — E859 Amyloidosis, unspecified: Secondary | ICD-10-CM

## 2022-08-26 NOTE — Telephone Encounter (Signed)
Patient is returning call.  °

## 2022-08-26 NOTE — Telephone Encounter (Signed)
Called patient to give her the following message from Dr Sallyanne Kuster: Unfortunately, the test for amyloidosis was equivocal. We could clarify that with a cardiac MRI. I would like to schedule that if they are in agreement.    No answer, left a voicemail with call back number on pt's cell phone.  Called pt's daughter; she answered- and is at work. She will tell her mother to call back to discuss.

## 2022-08-26 NOTE — Telephone Encounter (Signed)
Follow Up:      Patient is returning a call from earlier today

## 2022-08-26 NOTE — Telephone Encounter (Signed)
  Relayed Dr Croitoru's message to the patient. Patient agrees to have the MRI performed. Order placed. Went over all of the instructions listed below. Told that someone will get in touch with her over the next few weeks to get the MRI scheduled. Sent the instructions via MyChart as well.  Pt verbalized understanding.   [facility] [place], [place] [address] [phone] Please take advantage of the free valet parking available at the MAIN entrance (A entrance).  Proceed to the [facility] Radiology Department (First Floor) for check-in.   Magnetic resonance imaging (MRI) is a painless test that produces images of the inside of the body without using Xrays.  During an MRI, strong magnets and radio waves work together in a [employer], political party to form detailed images.   MRI images may provide more details about a medical condition than X-rays, CT scans, and ultrasounds can provide.  You may be given earphones to listen for instructions.  You may eat a light breakfast and take medications as ordered with the exception of furosemide, hydrochlorothiazide, or spironolactone(fluid pill, other). Please avoid stimulants for 12 hr prior to test. (Ie. Caffeine, nicotine, chocolate, or antihistamine medications)  If a contrast material will be used, an IV will be inserted into one of your veins. Contrast material will be injected into your IV. It will leave your body through your urine within a day. You may be told to drink plenty of fluids to help flush the contrast material out of your system.  You will be asked to remove all metal, including: Watch, jewelry, and other metal objects including hearing aids, hair pieces and dentures. Also wearable glucose monitoring systems (ie. Freestyle Libre and Omnipods) (Braces and fillings normally are not a problem.)   TEST WILL TAKE APPROXIMATELY 1 HOUR  PLEASE NOTIFY SCHEDULING AT LEAST 24 HOURS IN ADVANCE IF YOU ARE UNABLE TO KEEP  YOUR APPOINTMENT. [phone]

## 2022-09-13 ENCOUNTER — Ambulatory Visit (HOSPITAL_COMMUNITY): Payer: Medicare HMO

## 2022-09-14 ENCOUNTER — Other Ambulatory Visit (HOSPITAL_COMMUNITY): Payer: Self-pay

## 2022-09-14 ENCOUNTER — Encounter (HOSPITAL_COMMUNITY): Payer: Self-pay

## 2022-09-14 ENCOUNTER — Ambulatory Visit (HOSPITAL_COMMUNITY): Payer: Medicare HMO

## 2022-09-14 ENCOUNTER — Ambulatory Visit (HOSPITAL_COMMUNITY)
Admission: RE | Admit: 2022-09-14 | Discharge: 2022-09-14 | Disposition: A | Discharge status: Home / Self Care | Payer: Medicare HMO | Source: Ambulatory Visit | Attending: Adult Health | Admitting: Adult Health

## 2022-09-14 VITALS — BP 130/98 | HR 94 | Wt 242.0 lb

## 2022-09-14 DIAGNOSIS — N1831 Chronic kidney disease, stage 3a: Secondary | ICD-10-CM

## 2022-09-14 DIAGNOSIS — Z79899 Other long term (current) drug therapy: Secondary | ICD-10-CM | POA: Diagnosis not present

## 2022-09-14 DIAGNOSIS — I4819 Other persistent atrial fibrillation: Secondary | ICD-10-CM | POA: Diagnosis not present

## 2022-09-14 DIAGNOSIS — I081 Rheumatic disorders of both mitral and tricuspid valves: Secondary | ICD-10-CM | POA: Diagnosis not present

## 2022-09-14 DIAGNOSIS — I5032 Chronic diastolic (congestive) heart failure: Secondary | ICD-10-CM | POA: Insufficient documentation

## 2022-09-14 DIAGNOSIS — I130 Hypertensive heart and chronic kidney disease with heart failure and stage 1 through stage 4 chronic kidney disease, or unspecified chronic kidney disease: Secondary | ICD-10-CM | POA: Insufficient documentation

## 2022-09-14 DIAGNOSIS — Z7984 Long term (current) use of oral hypoglycemic drugs: Secondary | ICD-10-CM | POA: Insufficient documentation

## 2022-09-14 DIAGNOSIS — Z7901 Long term (current) use of anticoagulants: Secondary | ICD-10-CM | POA: Diagnosis not present

## 2022-09-14 MED ORDER — ENTRESTO 24-26 MG PO TABS: 1.0000 | ORAL_TABLET | Freq: Two times a day (BID) | ORAL | 2 refills | Status: DC

## 2022-09-14 NOTE — Patient Instructions (Addendum)
No Labs done today.   START Entresto 24-'26mg'$  (1 tablet) by mouth 2 times daily.   No other medication changes were made. Please continue all current medications as prescribed.  Your physician recommends that you schedule a follow-up appointment in: 2 weeks   If you have any questions or concerns before your next appointment please send [place] a message through [place] or call our office at [phone].    TO LEAVE A MESSAGE FOR THE NURSE SELECT OPTION 2, PLEASE LEAVE A MESSAGE INCLUDING: YOUR NAME DATE OF BIRTH CALL BACK NUMBER REASON FOR CALL**this is important as we prioritize the call backs  YOU WILL RECEIVE A CALL BACK THE SAME DAY AS LONG AS YOU CALL BEFORE 4:00 PM   Do the following things EVERYDAY: Weigh yourself in the morning before breakfast. Write it down and keep it in a log. Take your medicines as prescribed Eat low salt foods--Limit salt (sodium) to 2000 mg per day.  Stay as active as you can everyday Limit all fluids for the day to less than 2 liters   At the [facility], you and your health needs are our priority. As part of our continuing mission to provide you with exceptional heart care, we have created designated Provider Care Teams. These Care Teams include your primary Cardiologist (physician) and Advanced Practice Providers (APPs- Physician Assistants and Nurse Practitioners) who all work together to provide you with the care you need, when you need it.   You may see any of the following providers on your designated Care Team at your next follow up: Dr Glori Bickers Dr Haynes Kerns, NP Lyda Jester, [place] Audry Riles, PharmD   Please be sure to bring in all your medications bottles to every appointment.

## 2022-09-14 NOTE — Progress Notes (Addendum)
Medication Samples have been provided to the patient.  Drug name: Delene Loll       Strength: 24-'26mg'$         Qty: 4  LOT: [id number]  Exp.Date: 02/2024,10/2024  Dosing instructions: take 1 tab po bid  The patient has been instructed regarding the correct time, dose, and frequency of taking this medication, including desired effects and most common side effects.   Daniyal Tabor R Carleen Rhue [id number] PM 09/14/2022

## 2022-09-14 NOTE — Progress Notes (Signed)
HEART IMPACT TRANSITIONS OF CARE    PCP: Cipriano Mile NP  Primary Cardiologist: Dr Sallyanne Kuster  HPI: Ms Dudas is a 77 y/o AAF w/ h/o HFpEF, PAF, CKD IIIa and bilateral carpal tunnel syndrome s/p CTR, recently admitted w/ acute CHF and AFib w/ RVR. Echo showed EF 60-65%, severe BAE, mod-severe MR, severe TR and normal RV. Echo concerning for restrictive physiology. She was diuresed w/ IV Lasix and treated w/ metoprolol and digoxin for rate control. Given questionable home compliance w/ Eliquis, DCCV was not attempted. V-rates improved w/ rate control therapy. After diuresis, she was transitioned to 60 mg of PO Lasix and started on GDMT. Given concerns for possible amyloid, multiple myeloma panel was done and negative for M-spike protein. Recommendations were to plan outpatient PYP to assess for TTR amyloid. She was discharged home on 2/3 and referred to [facility] clinic. D/c wt 249 lb.    She was seen in HF [facility] clinic last month and was in A fib RVR. She unfortunately has been taking the wrong dose of metoprolol. She had only been taking 25 mg once daily, opposed to the prescribed dose of 75 mg daily.   Today she returns for HF follow up.Overall feeling fine. SOB with moderate exertion. Denies PND/Orthopnea. Limited by joint pain. Appetite ok. No fever or chills. No bleeding issues. Weight at home 240-242 pounds. Taking all medications. Works part time at [employer]. She mows a grass with riding lawn mower. Daughter and 2 grandchildren live with her. She drives to appointments.   Cardiac Testing  PYP 2024 Equivocal  08/04/22 TTE   1. Left ventricular ejection fraction, by estimation, is 60 to 65%. The  left ventricle has normal function. The left ventricle has no regional  wall motion abnormalities. Left ventricular diastolic function could not  be evaluated.   2. Right ventricular systolic function is normal. The right ventricular  size is normal. There is mildly elevated pulmonary artery systolic   pressure.   3. Left atrial size was severely dilated.   4. Right atrial size was severely dilated.   5. The mitral valve is normal in structure. Moderate to severe mitral  valve regurgitation. No evidence of mitral stenosis.   6. 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   7. The aortic valve is normal in structure. There is mild calcification  of the aortic valve. Aortic valve regurgitation is not visualized. Aortic  valve sclerosis/calcification is present, without any evidence of aortic  stenosis.   8. The inferior vena cava is dilated in size with <50% respiratory  variability, suggesting right atrial pressure of 15 mmHg.   9. Severe biatrial enlargement with mod-severe MR and severe TR  concerning for restrictive physiology.         ROS: All systems negative except as listed in HPI, PMH and Problem List.  SH:  Social History   Socioeconomic History   Marital status: Married    Spouse name: Not on file   Number of children: Not on file   Years of education: Not on file   Highest education level: Not on file  Occupational History   Not on file  Tobacco Use   Smoking status: Never   Smokeless tobacco: Never  Vaping Use   Vaping Use: Never used  Substance and Sexual Activity   Alcohol use: Yes    Comment: occasional   Drug use: No   Sexual activity: Never  Other Topics Concern   Not on file  Social History Narrative  Lives in [place] w/ husband.  Says she's relatively active but does not routinely exercise.   Social Determinants of Health   Financial Resource Strain: Low Risk  (08/05/2022)   Overall Financial Resource Strain (CARDIA)    Difficulty of Paying Living Expenses: Not very hard  Food Insecurity: No Food Insecurity (08/03/2022)   Hunger Vital Sign    Worried About Running Out of Food in the Last Year: Never true    Ran Out of Food in the Last Year: Never true  Transportation Needs: No Transportation Needs (08/03/2022)   PRAPARE - Armed forces logistics/support/administrative officer (Medical): No    Lack of Transportation (Non-Medical): No  Physical Activity: Not on file  Stress: Not on file  Social Connections: Not on file  Intimate Partner Violence: Not At Risk (08/03/2022)   Humiliation, Afraid, Rape, and Kick questionnaire    Fear of Current or Ex-Partner: No    Emotionally Abused: No    Physically Abused: No    Sexually Abused: No    FH:  Family History  Problem Relation Age of Onset   Arthritis Mother    Heart disease Mother    Hypertension Mother    Diabetes Mother    Stroke Father    Hypertension Father    Stroke Sister    Hypertension Sister    Heart disease Maternal Grandfather    Diabetes Paternal Grandmother    Heart disease Paternal Grandfather     Past Medical History:  Diagnosis Date   Allergy    Arthritis    Chicken pox    Chronic venous insufficiency    a. Uses furosemide prn.   Degenerative joint disease    Morbid obesity (HCC)    Osteoarthritis    PONV (postoperative nausea and vomiting)     Current Outpatient Medications  Medication Sig Dispense Refill   albuterol (VENTOLIN HFA) 108 (90 Base) MCG/ACT inhaler INHALE 2 PUFFS INTO LUNGS EVERY 6 HOURS AS NEEDED FOR WHEEZING OR SHORTNESS OF BREATH (Patient taking differently: Inhale 2 puffs into the lungs every 6 (six) hours as needed for wheezing or shortness of breath.) 6.7 each 1   apixaban (ELIQUIS) 5 MG TABS tablet Take 1 tablet (5 mg total) by mouth 2 (two) times daily. 60 tablet 11   Ascorbic Acid (VITAMIN C) 1000 MG tablet Take 1,000 mg by mouth daily.     Cholecalciferol (VITAMIN D3) 5000 UNITS CAPS Take 5,000 Units by mouth daily.      digoxin (LANOXIN) 0.125 MG tablet Take 1 tablet (0.125 mg total) by mouth daily. 30 tablet 0   empagliflozin (JARDIANCE) 10 MG TABS tablet Take 1 tablet (10 mg total) by mouth daily. 30 tablet 11   furosemide (LASIX) 40 MG tablet Take 1.5 tablets (60 mg total) by mouth daily. 30 tablet 10   hydroxypropyl  methylcellulose / hypromellose (ISOPTO TEARS / GONIOVISC) 2.5 % ophthalmic solution Place 1 drop into both eyes daily as needed for dry eyes.     metoprolol succinate (TOPROL-XL) 25 MG 24 hr tablet Take 3 tablets (75 mg total) by mouth daily. 90 tablet 11   polyethylene glycol (MIRALAX / GLYCOLAX) 17 g packet Take 17 g by mouth daily as needed for mild constipation.     potassium chloride SA (KLOR-CON M) 20 MEQ tablet Take 1 tablet (20 mEq total) by mouth daily. 30 tablet 11   vitamin B-12 (CYANOCOBALAMIN) 500 MCG tablet Take 500 mcg by mouth daily.     No  current facility-administered medications for this encounter.    Vitals:   09/14/22 1509  BP: (!) 130/98  Pulse: 94  SpO2: 99%  Weight: 109.8 kg (242 lb)   Wt Readings from Last 3 Encounters:  09/14/22 109.8 kg (242 lb)  08/24/22 110.8 kg (244 lb 3.2 oz)  08/06/22 113.5 kg (250 lb 3.6 oz)    PHYSICAL EXAM: General:  Well appearing. No resp difficulty HEENT: normal Neck: supple. JVP flat. Carotids 2+ bilaterally; no bruits. No lymphadenopathy or thryomegaly appreciated. Cor: PMI normal. Irregular rate & rhythm. No rubs, gallops or murmurs. Lungs: clear Abdomen: soft, nontender, nondistended. No hepatosplenomegaly. No bruits or masses. Good bowel sounds. Extremities: no cyanosis, clubbing, rash, R and LLE trace-1+ edema Neuro: alert & orientedx3, cranial nerves grossly intact. Moves all 4 extremities w/o difficulty. Affect pleasant.   ASSESSMENT & PLAN: 1. HFpEF - Echo 2/24 EF 60-65%, normal RV, restrictive physiology  - given concomitant atrial fibrillation and bilateral carpal tunnel syndrome, there is concern for possible infiltrative CM. Multiple Myeloma panel negative for M spike protein.  PYP equivocal. CMRI was ordered.  - NYHA II  - Volume status elevated.  - Continue Jardiance 10 mg daily  - Continue Lasix 60 mg daily  - Continue  Toprol XL to 75 mg daily to help w/ rate control  - Add entresto 24-26 mg twice a day.  Check BMET in 2 weeks. - If suggestive of TTR amyloid, will need referral to the AHF clinic to get started on Tafamidis and conduct gene testing   2. Persistent Atrial Fibrillation  - Rate controlled. Concerned cardioversion may not work due to LA/RA size and length of time in A fib.  - Continue Toprol XL to 75 mg daily as previously recommended  - Continue Digoxin 0.125 mg daily, dig level 0.4.  - Continue Eliquis 5 mg bid.  -  reports h/o snoring. Recommend sleep study evaluation. She will d/w her primary cardiologist and PCP .    3. Mitral Regurgitation  - mod-severe on echo - suspect functional MR, severe LAE - continue optimization of HF and Afib  - if she continues to have frequent recurrence of Afib and HF exacerbations, will need to consider referral to structural team for MitraClip evaluation    4. Tricuspid Regurgitation - severe on echo  - suspect functional atrial TR, severe RAE    5. Stage IIIa CKD - on Jardiance  - Check BMET next visit.   6. HTN  Elevated. Continue current regimen and add entresto 24-26 mg twice a day    Follow up in 2-3 weeks . Reassess volume BP. Should be able to return to Dr Sallyanne Kuster.   Toyna Erisman NP-C  3:25 PM

## 2022-09-28 ENCOUNTER — Ambulatory Visit
Admission: RE | Admit: 2022-09-28 | Discharge: 2022-09-28 | Disposition: A | Discharge status: Home / Self Care | Payer: Medicare HMO | Source: Ambulatory Visit | Attending: Student | Admitting: Student

## 2022-09-28 DIAGNOSIS — Z1231 Encounter for screening mammogram for malignant neoplasm of breast: Secondary | ICD-10-CM

## 2022-10-05 ENCOUNTER — Encounter (HOSPITAL_COMMUNITY): Payer: Self-pay

## 2022-10-05 ENCOUNTER — Telehealth (HOSPITAL_COMMUNITY): Payer: Self-pay | Admitting: Cardiology

## 2022-10-05 ENCOUNTER — Ambulatory Visit (HOSPITAL_COMMUNITY)
Admission: RE | Admit: 2022-10-05 | Discharge: 2022-10-05 | Disposition: A | Discharge status: Home / Self Care | Payer: Medicare HMO | Source: Ambulatory Visit | Attending: Cardiology | Admitting: Cardiology

## 2022-10-05 DIAGNOSIS — N1831 Chronic kidney disease, stage 3a: Secondary | ICD-10-CM | POA: Insufficient documentation

## 2022-10-05 DIAGNOSIS — Z79899 Other long term (current) drug therapy: Secondary | ICD-10-CM | POA: Diagnosis not present

## 2022-10-05 DIAGNOSIS — I130 Hypertensive heart and chronic kidney disease with heart failure and stage 1 through stage 4 chronic kidney disease, or unspecified chronic kidney disease: Secondary | ICD-10-CM | POA: Insufficient documentation

## 2022-10-05 DIAGNOSIS — Z7901 Long term (current) use of anticoagulants: Secondary | ICD-10-CM | POA: Diagnosis not present

## 2022-10-05 DIAGNOSIS — I5032 Chronic diastolic (congestive) heart failure: Secondary | ICD-10-CM | POA: Diagnosis not present

## 2022-10-05 DIAGNOSIS — R42 Dizziness and giddiness: Secondary | ICD-10-CM | POA: Diagnosis not present

## 2022-10-05 DIAGNOSIS — I081 Rheumatic disorders of both mitral and tricuspid valves: Secondary | ICD-10-CM | POA: Insufficient documentation

## 2022-10-05 DIAGNOSIS — Z7984 Long term (current) use of oral hypoglycemic drugs: Secondary | ICD-10-CM | POA: Insufficient documentation

## 2022-10-05 DIAGNOSIS — I4819 Other persistent atrial fibrillation: Secondary | ICD-10-CM | POA: Insufficient documentation

## 2022-10-05 LAB — BASIC METABOLIC PANEL
Anion gap: 8 (ref 5–15)
BUN: 16 mg/dL (ref 8–23)
CO2: 28 mmol/L (ref 22–32)
Calcium: 8.7 mg/dL — ABNORMAL LOW (ref 8.9–10.3)
Chloride: 103 mmol/L (ref 98–111)
Creatinine, Ser: 1.24 mg/dL — ABNORMAL HIGH (ref 0.44–1.00)
GFR, Estimated: 45 mL/min — ABNORMAL LOW (ref >60)
Glucose, Bld: 140 mg/dL — ABNORMAL HIGH (ref 70–99)
Potassium: 3.8 mmol/L (ref 3.5–5.1)
Sodium: 139 mmol/L (ref 135–145)

## 2022-10-05 LAB — DIGOXIN LEVEL: Digoxin Level: 0.8 ng/mL (ref 0.8–2.0)

## 2022-10-05 LAB — CBC
HCT: 42.3 % (ref 36.0–46.0)
Hemoglobin: 14 g/dL (ref 12.0–15.0)
MCH: 30.9 pg (ref 26.0–34.0)
MCHC: 33.1 g/dL (ref 30.0–36.0)
MCV: 93.4 fL (ref 80.0–100.0)
Platelets: 226 10*3/uL (ref 150–400)
RBC: 4.53 MIL/uL (ref 3.87–5.11)
RDW: 14 % (ref 11.5–15.5)
WBC: 4.8 10*3/uL (ref 4.0–10.5)
nRBC: 0 % (ref 0.0–0.2)

## 2022-10-05 MED ORDER — FUROSEMIDE 40 MG PO TABS: 60.0000 mg | ORAL_TABLET | Freq: Every day | ORAL | 3 refills | Status: DC

## 2022-10-05 MED ORDER — DIGOXIN 125 MCG PO TABS: 0.1250 mg | ORAL_TABLET | Freq: Every day | ORAL | 3 refills | Status: DC

## 2022-10-05 MED ORDER — ENTRESTO 24-26 MG PO TABS: 1.0000 | ORAL_TABLET | Freq: Two times a day (BID) | ORAL | 3 refills | Status: DC

## 2022-10-05 NOTE — Progress Notes (Signed)
HEART IMPACT TRANSITIONS OF CARE    PCP: Cipriano Mile NP    Primary Cardiologist: Dr Sallyanne Kuster  HPI: Ms Elmquist is a 77 y/o AAF w/ h/o HFpEF, PAF, CKD IIIa and bilateral carpal tunnel syndrome s/p CTR, recently admitted w/ acute CHF and AFib w/ RVR. Echo showed EF 60-65%, severe BAE, mod-severe MR, severe TR and normal RV. Echo concerning for restrictive physiology. She was diuresed w/ IV Lasix and treated w/ metoprolol and digoxin for rate control. Given questionable home compliance w/ Eliquis, DCCV was not attempted. V-rates improved w/ rate control therapy. After diuresis, she was transitioned to 60 mg of PO Lasix and started on GDMT. Given concerns for possible amyloid, multiple myeloma panel was done and negative for M-spike protein. Recommendations were to plan outpatient PYP to assess for TTR amyloid. She was discharged home on 2/3 and referred to [facility] clinic. D/c wt 249 lb.    She was seen in HF [facility] clinic last month and was in A fib RVR. She unfortunately had been taking the wrong dose of metoprolol. She had only been taking 25 mg once daily, opposed to the prescribed dose of 75 mg daily. Instructed to increase dose to 75 mg.   At return f/u she was feeling ok w/ SOB w/ moderate exertion, limited more so by joint pain. HR improved. Entresto added to regimen. She has completed PYP scan but study was equivocal (visual score of 1/ratio between 1-1.5). cMRI recommended. This has not been completed yet, scheduled for 5/15.  She returns back to [facility] clinic today for f/u. Doing ok. Denies resting dyspnea. SOB w/ mod activity, this is also confounded by obesity and deconditioning. BP 122/82.  HR controlled. Denies palpitations. Wt stable, down 5 lb from hospital d/c wt. Reports full compliance w/ meds. Notes some occasional dizziness if she stands too quickly, but this is not frequent. Denies falls. No abnormal bleeding w/ Eliquis.     Cardiac Testing  PYP 2024 Equivocal  08/04/22 TTE   1.  Left ventricular ejection fraction, by estimation, is 60 to 65%. The  left ventricle has normal function. The left ventricle has no regional  wall motion abnormalities. Left ventricular diastolic function could not  be evaluated.   2. Right ventricular systolic function is normal. The right ventricular  size is normal. There is mildly elevated pulmonary artery systolic  pressure.   3. Left atrial size was severely dilated.   4. Right atrial size was severely dilated.   5. The mitral valve is normal in structure. Moderate to severe mitral  valve regurgitation. No evidence of mitral stenosis.   6. 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   7. The aortic valve is normal in structure. There is mild calcification  of the aortic valve. Aortic valve regurgitation is not visualized. Aortic  valve sclerosis/calcification is present, without any evidence of aortic  stenosis.   8. The inferior vena cava is dilated in size with <50% respiratory  variability, suggesting right atrial pressure of 15 mmHg.   9. Severe biatrial enlargement with mod-severe MR and severe TR  concerning for restrictive physiology.      ROS: All systems negative except as listed in HPI, PMH and Problem List.  SH:  Social History   Socioeconomic History   Marital status: Married    Spouse name: Not on file   Number of children: Not on file   Years of education: Not on file   Highest education level: Not on file  Occupational History   Not on file  Tobacco Use   Smoking status: Never   Smokeless tobacco: Never  Vaping Use   Vaping Use: Never used  Substance and Sexual Activity   Alcohol use: Yes    Comment: occasional   Drug use: No   Sexual activity: Never  Other Topics Concern   Not on file  Social History Narrative   Lives in [place] w/ husband.  Says she's relatively active but does not routinely exercise.   Social Determinants of Health   Financial Resource Strain: Low Risk  (08/05/2022)    Overall Financial Resource Strain (CARDIA)    Difficulty of Paying Living Expenses: Not very hard  Food Insecurity: No Food Insecurity (08/03/2022)   Hunger Vital Sign    Worried About Running Out of Food in the Last Year: Never true    Ran Out of Food in the Last Year: Never true  Transportation Needs: No Transportation Needs (08/03/2022)   PRAPARE - Hydrologist (Medical): No    Lack of Transportation (Non-Medical): No  Physical Activity: Not on file  Stress: Not on file  Social Connections: Not on file  Intimate Partner Violence: Not At Risk (08/03/2022)   Humiliation, Afraid, Rape, and Kick questionnaire    Fear of Current or Ex-Partner: No    Emotionally Abused: No    Physically Abused: No    Sexually Abused: No    FH:  Family History  Problem Relation Age of Onset   Arthritis Mother    Heart disease Mother    Hypertension Mother    Diabetes Mother    Stroke Father    Hypertension Father    Stroke Sister    Hypertension Sister    Heart disease Maternal Grandfather    Diabetes Paternal Grandmother    Heart disease Paternal Grandfather    Breast cancer Neg Hx     Past Medical History:  Diagnosis Date   Allergy    Arthritis    Chicken pox    Chronic venous insufficiency    a. Uses furosemide prn.   Degenerative joint disease    Morbid obesity    Osteoarthritis    PONV (postoperative nausea and vomiting)     Current Outpatient Medications  Medication Sig Dispense Refill   albuterol (VENTOLIN HFA) 108 (90 Base) MCG/ACT inhaler INHALE 2 PUFFS INTO LUNGS EVERY 6 HOURS AS NEEDED FOR WHEEZING OR SHORTNESS OF BREATH (Patient taking differently: Inhale 2 puffs into the lungs every 6 (six) hours as needed for wheezing or shortness of breath.) 6.7 each 1   apixaban (ELIQUIS) 5 MG TABS tablet Take 1 tablet (5 mg total) by mouth 2 (two) times daily. 60 tablet 11   Ascorbic Acid (VITAMIN C) 1000 MG tablet Take 1,000 mg by mouth daily.      Cholecalciferol (VITAMIN D3) 5000 UNITS CAPS Take 5,000 Units by mouth daily.      digoxin (LANOXIN) 0.125 MG tablet Take 1 tablet (0.125 mg total) by mouth daily. 30 tablet 0   empagliflozin (JARDIANCE) 10 MG TABS tablet Take 1 tablet (10 mg total) by mouth daily. 30 tablet 11   furosemide (LASIX) 40 MG tablet Take 1.5 tablets (60 mg total) by mouth daily. 30 tablet 10   hydroxypropyl methylcellulose / hypromellose (ISOPTO TEARS / GONIOVISC) 2.5 % ophthalmic solution Place 1 drop into both eyes daily as needed for dry eyes.     metoprolol succinate (TOPROL-XL) 25 MG 24 hr tablet Take  3 tablets (75 mg total) by mouth daily. 90 tablet 11   polyethylene glycol (MIRALAX / GLYCOLAX) 17 g packet Take 17 g by mouth daily as needed for mild constipation.     potassium chloride SA (KLOR-CON M) 20 MEQ tablet Take 1 tablet (20 mEq total) by mouth daily. 30 tablet 11   sacubitril-valsartan (ENTRESTO) 24-26 MG Take 1 tablet by mouth 2 (two) times daily. 60 tablet 2   vitamin B-12 (CYANOCOBALAMIN) 500 MCG tablet Take 500 mcg by mouth daily.     No current facility-administered medications for this encounter.    Vitals:   10/05/22 1435  BP: (!) 122/92  Pulse: 93  SpO2: 97%  Weight: 110.7 kg (244 lb)    Wt Readings from Last 3 Encounters:  10/05/22 110.7 kg (244 lb)  09/14/22 109.8 kg (242 lb)  08/24/22 110.8 kg (244 lb 3.2 oz)    PHYSICAL EXAM: General:  Well appearing, obese. No respiratory difficulty HEENT: normal Neck: supple. no JVD. Carotids 2+ bilat; no bruits. No lymphadenopathy or thyromegaly appreciated. Cor: PMI nondisplaced. Irregularly irregular rhythm and rate. No rubs, gallops or murmurs. Lungs: clear Abdomen: obese, soft, nontender, nondistended. No hepatosplenomegaly. No bruits or masses. Good bowel sounds. Extremities: no cyanosis, clubbing, rash, obese extremities, no pitting edema  Neuro: alert & oriented x 3, cranial nerves grossly intact. moves all 4 extremities w/o  difficulty. Affect pleasant.    ASSESSMENT & PLAN: 1. HFpEF - Echo 2/24 EF 60-65%, normal RV, restrictive physiology  - given concomitant atrial fibrillation and bilateral carpal tunnel syndrome, there is concern for possible infiltrative CM. Multiple Myeloma panel negative for M spike protein.  PYP equivocal (visual score of 1/ratio between 1-1.5) - scheduled to get cMRI (May 15th), result will go to Dr. Sallyanne Kuster. If suggestive of amyloid recommend referral to the [facility] for further management.  - NYHA II, confounded by obesity and deconditioning  - Not grossly fluid overloaded on exam and wt down from last hospital dry wt  - Continue Jardiance 10 mg daily  - Continue Entresto 24-26 mg. No dose titration given occasional positional dizziness w/ standing  - Continue  Toprol XL to 75 mg daily - Continue digoxin 0.125. Check Digoxin level  - Continue Lasix 60 mg daily  - Check BMP today    2. Persistent Atrial Fibrillation  - Rate controlled. Concerned cardioversion may not work due to LA/RA size and length of time in A fib.  - Continue Toprol XL to 75 mg daily - Continue Digoxin 0.125 mg daily. Check Digoxin level  - Continue Eliquis 5 mg bid. Denies abnormal bleeding. Check CVC  -  reports h/o snoring. Recommend sleep study evaluation. She will d/w her primary cardiologist and PCP .    3. Mitral Regurgitation  - mod-severe on echo - suspect functional MR, severe LAE - continue optimization of HF and Afib  - if she continues to have frequent recurrence of Afib and HF exacerbations, will need to consider referral to structural team for MitraClip evaluation    4. Tricuspid Regurgitation - severe on echo  - suspect functional atrial TR, severe RAE    5. Stage IIIa CKD - on Jardiance  - check BMP   6. HTN  - controlled on current regimen - GDMT per above - check BMP   Keep w/u scheduled w/ Dr. Sallyanne Kuster  after cMRI next month. If suggestive of amyloidosis, recommend referral to  the [facility] for further management and genetic testing.   Lyda Jester,  PA-C  2:43 PM

## 2022-10-05 NOTE — Telephone Encounter (Signed)
Julia Mcfarland, [facility] 10/05/2022  4:41 PM EDT Back to Top    Patient called.  Patient aware.

## 2022-10-05 NOTE — Telephone Encounter (Signed)
-----   Message from Consuelo Pandy, [place] sent at 10/05/2022  4:36 PM EDT ----- Dig level elevated. Given concern for possible amyloid, recommend that she stop digoxin. All other labs stable

## 2022-10-05 NOTE — Patient Instructions (Signed)
Labs done today. We will contact you only if your labs are abnormal.  No other medication changes were made. Please continue all current medications as prescribed.  Thank you for allowing [place] to provide your heart failure care after your recent hospitalization. Please follow-up with General Cardiology.

## 2022-11-15 ENCOUNTER — Telehealth (HOSPITAL_COMMUNITY): Payer: Self-pay | Admitting: *Deleted

## 2022-11-15 NOTE — Telephone Encounter (Signed)
Reaching out to patient to offer assistance regarding upcoming cardiac imaging study; pt verbalizes understanding of appt date/time, parking situation and where to check in, and verified current allergies; name and call back number provided for further questions should they arise  Larey Brick RN Navigator Cardiac Imaging [facility] [phone] office [phone] cell  Patient denies claustrophobia.  Pt reports a breast marker and knee replacement.

## 2022-11-16 ENCOUNTER — Ambulatory Visit (HOSPITAL_COMMUNITY)
Admission: RE | Admit: 2022-11-16 | Discharge: 2022-11-16 | Disposition: A | Discharge status: Home / Self Care | Payer: Medicare HMO | Source: Ambulatory Visit | Attending: Cardiovascular Disease | Admitting: Cardiovascular Disease

## 2022-11-16 ENCOUNTER — Other Ambulatory Visit: Payer: Self-pay | Admitting: Cardiovascular Disease

## 2022-11-16 DIAGNOSIS — E859 Amyloidosis, unspecified: Secondary | ICD-10-CM

## 2022-11-16 MED ORDER — GADOBUTROL 1 MMOL/ML IV SOLN
10.0000 mL | Freq: Once | INTRAVENOUS | Status: AC | PRN
  Administered 2022-11-16: 10 mL via INTRAVENOUS

## 2022-12-16 ENCOUNTER — Telehealth (HOSPITAL_COMMUNITY): Payer: Self-pay | Admitting: Cardiology

## 2022-12-16 NOTE — Telephone Encounter (Signed)
Pt called to report Itchiness all over since starting entresto 09/2022 Reports she has narrowed it down to this med Shortly after taking pts arms,trunk, and face with begin itching Reports eyes swelling  Denies additional facial swelling-tongue swelling    Advised to hold until further input from provider

## 2022-12-20 MED ORDER — LOSARTAN POTASSIUM 25 MG PO TABS: 25.0000 mg | ORAL_TABLET | Freq: Every day | ORAL | 3 refills | Status: DC

## 2022-12-21 ENCOUNTER — Encounter (HOSPITAL_COMMUNITY): Payer: Self-pay | Admitting: Cardiology

## 2022-12-21 NOTE — Telephone Encounter (Signed)
Multiple attempts to return call  Mychart message sent

## 2023-02-01 ENCOUNTER — Telehealth: Payer: Self-pay | Admitting: Emergency Medicine

## 2023-02-01 ENCOUNTER — Encounter: Payer: Self-pay | Admitting: Cardiovascular Disease

## 2023-02-01 ENCOUNTER — Ambulatory Visit: Payer: Medicare HMO | Attending: Cardiovascular Disease | Admitting: Cardiovascular Disease

## 2023-02-01 VITALS — BP 96/62 | HR 97 | Ht 62.0 in | Wt 237.2 lb

## 2023-02-01 DIAGNOSIS — I5032 Chronic diastolic (congestive) heart failure: Secondary | ICD-10-CM

## 2023-02-01 DIAGNOSIS — I2584 Coronary atherosclerosis due to calcified coronary lesion: Secondary | ICD-10-CM

## 2023-02-01 DIAGNOSIS — I700 Atherosclerosis of aorta: Secondary | ICD-10-CM

## 2023-02-01 DIAGNOSIS — I4819 Other persistent atrial fibrillation: Secondary | ICD-10-CM

## 2023-02-01 DIAGNOSIS — R7303 Prediabetes: Secondary | ICD-10-CM

## 2023-02-01 DIAGNOSIS — I2510 Atherosclerotic heart disease of native coronary artery without angina pectoris: Secondary | ICD-10-CM

## 2023-02-01 DIAGNOSIS — N1831 Chronic kidney disease, stage 3a: Secondary | ICD-10-CM | POA: Diagnosis not present

## 2023-02-01 MED ORDER — LOSARTAN POTASSIUM 25 MG PO TABS: 12.5000 mg | ORAL_TABLET | Freq: Every day | ORAL | 3 refills | Status: DC

## 2023-02-01 MED ORDER — METOPROLOL SUCCINATE ER 100 MG PO TB24: 100.0000 mg | ORAL_TABLET | Freq: Every day | ORAL | 3 refills | Status: DC

## 2023-02-01 NOTE — Progress Notes (Signed)
Cardiology Office Note:    Date:  02/01/2023   ID:  BIAK VOELLER, DOB 30-Nov-1945, MRN [id number]  PCP:  Hillery Aldo, NP  [facility] Cardiologist:  Thurmon Fair, MD  [facility] Electrophysiologist:  None   Referring MD: Hillery Aldo, NP   Chief Complaint  Patient presents with   Congestive Heart Failure   Atrial Fibrillation    History of Present Illness:    Julia Mcfarland is a 77 y.o. female with a hx of obesity and leg edema, who had paroxysmal atrial fibrillation during the hospitalization with COVID-19 pneumonia in 2021.  Unfortunately her husband died of Covid infection around that time as well.  She stopped taking amiodarone after a few weeks, but was continued on anticoagulation since a follow-up monitor did not show lengthy episodes of paroxysmal atrial fibrillation.Marland Kitchen    She was then lost to follow-up until she was hospitalized in January 2024 with congestive heart failure and atrial fibrillation with rapid ventricular response.  She was unaware of the arrhythmia.  Her echocardiogram showed normal LVEF 60-65% and severe biatrial dilation, moderate to severe MR and severe TR.  She had not been taking her Eliquis consistently.  It has been challenging to achieve good rate control.  She has been followed in the heart failure clinic over the last few months.  Workup has included a pyrophosphate scan that was equivocal for amyloidosis, no evidence of abnormal light chains and a subsequent cardiac MRI that showed biatrial dilation but no evidence of amyloidosis or myocardial scarring.  There was moderate central mitral regurgitation was felt to be secondary to atrial functional mechanism.  Her edema is greatly improved and she thinks she has also lost a little bit of true weight.  Weight in clinic today was down to 237 pounds, substantially lower than in the past.  She weighed 244 pounds at her last visit in the heart failure clinic.  She has some residual edema, but this is  "much better".  She does not have orthopnea or PND and is able to do housework without shortness of breath.  She does not have daytime hypersomnolence, although she has been told that she snores.  She has been compliant with furosemide, Jardiance and metoprolol 75 mg once daily.  It appears that she was on digoxin for a while but that this was discontinued in April due to a high level and concern for amyloidosis.  She was intolerant of Entresto due to generalized itching, but is tolerating a low-dose of losartan.  Her blood pressure is borderline low today at 96/62 but she has not had dizziness or syncope.  She has not had any bleeding problems with the Eliquis.  She tells me she had labs checked at [facility] just a few days ago and we will request a copy.  Her most recent creatinine was [phone] and her hemoglobin on the same date was 14.  Past Medical History:  Diagnosis Date   Allergy    Arthritis    Chicken pox    Chronic venous insufficiency    a. Uses furosemide prn.   Degenerative joint disease    Morbid obesity (HCC)    Osteoarthritis    PONV (postoperative nausea and vomiting)     Past Surgical History:  Procedure Laterality Date   ABDOMINAL HYSTERECTOMY  1988   CARPAL TUNNEL RELEASE Right    COLONOSCOPY WITH PROPOFOL N/A 09/01/2020   Procedure: COLONOSCOPY WITH PROPOFOL;  Surgeon: Vida Rigger, MD;  Location: [facility]  ENDOSCOPY;  Service: Endoscopy;  Laterality: N/A;   POLYPECTOMY  09/01/2020   Procedure: POLYPECTOMY;  Surgeon: Vida Rigger, MD;  Location: [facility] ENDOSCOPY;  Service: Endoscopy;;   REPLACEMENT TOTAL KNEE Right 2010   TOTAL KNEE ARTHROPLASTY Left 03/07/2017   Procedure: LEFT TOTAL KNEE ARTHROPLASTY;  Surgeon: Cammy Copa, MD;  Location: [facility] OR;  Service: Orthopedics;  Laterality: Left;    Current Medications: Current Meds  Medication Sig   apixaban (ELIQUIS) 5 MG TABS tablet Take 1 tablet (5 mg total) by mouth 2 (two) times daily.   Ascorbic Acid (VITAMIN C) 1000  MG tablet Take 1,000 mg by mouth daily.   Cholecalciferol (VITAMIN D3) 5000 UNITS CAPS Take 5,000 Units by mouth daily.    empagliflozin (JARDIANCE) 10 MG TABS tablet Take 1 tablet (10 mg total) by mouth daily.   furosemide (LASIX) 40 MG tablet Take 1.5 tablets (60 mg total) by mouth daily.   hydroxypropyl methylcellulose / hypromellose (ISOPTO TEARS / GONIOVISC) 2.5 % ophthalmic solution Place 1 drop into both eyes daily as needed for dry eyes.   polyethylene glycol (MIRALAX / GLYCOLAX) 17 g packet Take 17 g by mouth daily as needed for mild constipation.   potassium chloride SA (KLOR-CON M) 20 MEQ tablet Take 1 tablet (20 mEq total) by mouth daily.   vitamin B-12 (CYANOCOBALAMIN) 500 MCG tablet Take 500 mcg by mouth daily.   [DISCONTINUED] losartan (COZAAR) 25 MG tablet Take 1 tablet (25 mg total) by mouth daily.   [DISCONTINUED] metoprolol succinate (TOPROL-XL) 25 MG 24 hr tablet Take 3 tablets (75 mg total) by mouth daily.     Allergies:   Patient has no known allergies.   Social History   Socioeconomic History   Marital status: Married    Spouse name: Not on file   Number of children: Not on file   Years of education: Not on file   Highest education level: Not on file  Occupational History   Not on file  Tobacco Use   Smoking status: Never   Smokeless tobacco: Never  Vaping Use   Vaping status: Never Used  Substance and Sexual Activity   Alcohol use: Yes    Comment: occasional   Drug use: No   Sexual activity: Never  Other Topics Concern   Not on file  Social History Narrative   Lives in [place] w/ husband.  Says she's relatively active but does not routinely exercise.   Social Determinants of Health   Financial Resource Strain: Low Risk  (08/05/2022)   Overall Financial Resource Strain (CARDIA)    Difficulty of Paying Living Expenses: Not very hard  Food Insecurity: No Food Insecurity (08/03/2022)   Hunger Vital Sign    Worried About Running Out of Food in the  Last Year: Never true    Ran Out of Food in the Last Year: Never true  Transportation Needs: No Transportation Needs (08/03/2022)   PRAPARE - Administrator, Civil Service (Medical): No    Lack of Transportation (Non-Medical): No  Physical Activity: Not on file  Stress: Not on file  Social Connections: Not on file     Family History: The patient's family history includes Arthritis in her mother; Diabetes in her mother and paternal grandmother; Heart disease in her maternal grandfather, mother, and paternal grandfather; Hypertension in her father, mother, and sister; Stroke in her father and sister. There is no history of Breast cancer.  ROS:   Please see the history of present illness.  All other systems reviewed and are negative.  EKGs/Labs/Other Studies Reviewed:    The following studies were reviewed today: Echo 10/29/2019  1. Normal LV function; mild LAE.   2. Left ventricular ejection fraction, by estimation, is 55 to 60%. The  left ventricle has normal function. The left ventricle has no regional  wall motion abnormalities. Left ventricular diastolic function could not  be evaluated.   3. Right ventricular systolic function is normal. The right ventricular  size is normal. Tricuspid regurgitation signal is inadequate for assessing  PA pressure.   4. Left atrial size was mildly dilated.   5. The mitral valve is normal in structure. Trivial mitral valve  regurgitation. No evidence of mitral stenosis.   6. The aortic valve is tricuspid. Aortic valve regurgitation is not  visualized. No aortic stenosis is present.   7. The inferior vena cava is normal in size with greater than 50%  respiratory variability, suggesting right atrial pressure of 3 mmHg.   MRI 11/16/2022  IMPRESSION: 1.  Severe bi atrial enlargement no LAA thrombus   2.  Normal LV size and function LVEF 52%   3.  Mild RVE with low normal function RVEF 48%   4. Delayed enhancement images with  normal nulling and no gadolinium uptake No evidence of amyloid   5.  Moderate appearing atrial functional MR   6.  Normal parametric measures   7.  Normal cardiac output 6.3 L/min   EKG:  EKG is  not ordered today.  The ekg ordered 08/24/2022 demonstrates atrial fibrillation w RVR  Recent Labs: 08/03/2022: Magnesium 2.0; TSH 0.778 08/24/2022: B Natriuretic Peptide 180.1 10/05/2022: BUN 16; Creatinine, Ser 1.24; Hemoglobin 14.0; Platelets 226; Potassium 3.8; Sodium 139  Recent Lipid Panel    Component Value Date/Time   CHOL 191 03/19/2020 1112   TRIG 55.0 03/19/2020 1112   HDL 67.50 03/19/2020 1112   CHOLHDL 3 03/19/2020 1112   VLDL 11.0 03/19/2020 1112   LDLCALC 112 (H) 03/19/2020 1112   LDLDIRECT 113.6 08/14/2013 1557    Physical Exam:    VS:  BP 96/62 (BP Location: Left Arm, Patient Position: Sitting, Cuff Size: Large)   Pulse 97   Ht 5\' 2"  (1.575 m)   Wt 237 lb 3.2 oz (107.6 kg)   SpO2 95%   BMI 43.38 kg/m     Wt Readings from Last 3 Encounters:  02/01/23 237 lb 3.2 oz (107.6 kg)  10/05/22 244 lb (110.7 kg)  09/14/22 242 lb (109.8 kg)      General: Alert, oriented x3, no distress, morbidly obese Head: no evidence of trauma, PERRL, EOMI, no exophtalmos or lid lag, no myxedema, no xanthelasma; normal ears, nose and oropharynx Neck: normal jugular venous pulsations and no hepatojugular reflux; brisk carotid pulses without delay and no carotid bruits Chest: clear to auscultation, no signs of consolidation by percussion or palpation, normal fremitus, symmetrical and full respiratory excursions Cardiovascular: normal position and quality of the apical impulse, irregular rhythm, normal first and second heart sounds, 1/6 holosystolic murmur at the left lower sternal border, no apical systolic murmur is heard, no diastolic murmurs, rubs or gallops Abdomen: no tenderness or distention, no masses by palpation, no abnormal pulsatility or arterial bruits, normal bowel sounds, no  hepatosplenomegaly Extremities: no clubbing, cyanosis or edema; 2+ radial, ulnar and brachial pulses bilaterally; 2+ right femoral, posterior tibial and dorsalis pedis pulses; 2+ left femoral, posterior tibial and dorsalis pedis pulses; no subclavian or femoral bruits Neurological: grossly nonfocal Psych:  Normal mood and affect   ASSESSMENT:    1. Persistent atrial fibrillation (HCC)   2. Chronic diastolic heart failure (HCC)   3. Stage 3a chronic kidney disease (CKD) (HCC)   4. Morbid obesity (HCC)   5. Prediabetes   6. Atherosclerosis of aorta (HCC)   7. Coronary artery calcification    PLAN:    In order of problems listed above:  AFib: On appropriate anticoagulation.  Borderline ventricular rate control with a resting heart rate around 100.  Blood pressure is borderline low.  Will increase her metoprolol to 100 mg daily and decrease the losartan to 12.5 mg daily.  She was never aware of the arrhythmia and it is possible she has been on it for a long time (at least for the last 6 months, possibly 4 years).  She has morbid obesity and has severe biatrial dilation as well as valvular abnormalities.  The likelihood of long-term maintenance of sinus rhythm is poor.  We could consider starting her back on amiodarone and performing a cardioversion, but since she is clinically well I think we may just treat this as permanent atrial fibrillation.  CHA2DS2-VASc score is 74 (age 61, gender, HF), maybe 5 if aortic atherosclerosis is considered equivalent for PAD.   CHF: She is possibly close to euvolemia.  Her weight has been slowly going down so her "dry weight" needs to be recalibrated (I would definitely set it at less than 240 pounds).  Will get the labs recently performed by her PCP.  No evidence of amyloidosis.  I think she just had heart failure due to uncontrolled atrial fibrillation, possibly underlying diastolic dysfunction of obesity. Morbid obesity: Encouraged her efforts to continue to lose  weight by improving her diet. PreDM: Hemoglobin A1c was 6.1% in 2021 and I am not sure if it has been checked recently.  Get labs from PCP.  Weight loss again would be beneficial. Chronic low HDL cholesterol, but LDL has never been high.  Need to reevaluate this as well.  Now on Jardiance about heart failure and glucose control. Aortic atherosclerosis/coronary calcifications: Incidentally noted on imaging studies, no clinically evident CAD or PAD.  The focus is on risk factor modification. CKD3: Baseline creatinine seems to be around 1.1-1.2, corresponding to GFR 45-50.    Medication Adjustments/Labs and Tests Ordered: Current medicines are reviewed at length with the patient today.  Concerns regarding medicines are outlined above.  No orders of the defined types were placed in this encounter.  Meds ordered this encounter  Medications   losartan (COZAAR) 25 MG tablet    Sig: Take 0.5 tablets (12.5 mg total) by mouth daily.    Dispense:  45 tablet    Refill:  3    D/C ENTRESTO   metoprolol succinate (TOPROL-XL) 100 MG 24 hr tablet    Sig: Take 1 tablet (100 mg total) by mouth daily.    Dispense:  90 tablet    Refill:  3    Patient Instructions  Medication Instructions:  Increase Metoprolol Succinate to 100 mg daily Decrease Losartan to 12.5 mg daily *If you need a refill on your cardiac medications before your next appointment, please call your pharmacy*  Follow-Up: At [facility], you and your health needs are our priority.  As part of our continuing mission to provide you with exceptional heart care, we have created designated Provider Care Teams.  These Care Teams include your primary Cardiologist (physician) and Advanced Practice Providers (APPs -  Physician Assistants  and Nurse Practitioners) who all work together to provide you with the care you need, when you need it.  We recommend signing up for the patient portal called "MyChart".  Sign up information is provided on  this After Visit Summary.  MyChart is used to connect with patients for Virtual Visits (Telemedicine).  Patients are able to view lab/test results, encounter notes, upcoming appointments, etc.  Non-urgent messages can be sent to your provider as well.   To learn more about what you can do with MyChart, go to [email or web address].    Your next appointment:   6 month(s)  Provider:   Thurmon Fair, MD       Signed, Thurmon Fair, MD  02/01/2023 11:43 AM    [facility]

## 2023-02-01 NOTE — Patient Instructions (Signed)
Medication Instructions:  Increase Metoprolol Succinate to 100 mg daily Decrease Losartan to 12.5 mg daily *If you need a refill on your cardiac medications before your next appointment, please call your pharmacy*  Follow-Up: At [facility], you and your health needs are our priority.  As part of our continuing mission to provide you with exceptional heart care, we have created designated Provider Care Teams.  These Care Teams include your primary Cardiologist (physician) and Advanced Practice Providers (APPs -  Physician Assistants and Nurse Practitioners) who all work together to provide you with the care you need, when you need it.  We recommend signing up for the patient portal called "MyChart".  Sign up information is provided on this After Visit Summary.  MyChart is used to connect with patients for Virtual Visits (Telemedicine).  Patients are able to view lab/test results, encounter notes, upcoming appointments, etc.  Non-urgent messages can be sent to your provider as well.   To learn more about what you can do with MyChart, go to [email or web address].    Your next appointment:   6 month(s)  Provider:   Thurmon Fair, MD

## 2023-02-01 NOTE — Telephone Encounter (Signed)
Fax sent to [facility] (pcp) requesting pt's most recent lab work- BMP and CBC

## 2023-02-28 ENCOUNTER — Telehealth: Payer: Self-pay | Admitting: Cardiovascular Disease

## 2023-02-28 DIAGNOSIS — I5032 Chronic diastolic (congestive) heart failure: Secondary | ICD-10-CM

## 2023-02-28 DIAGNOSIS — N1831 Chronic kidney disease, stage 3a: Secondary | ICD-10-CM

## 2023-02-28 NOTE — Telephone Encounter (Signed)
Please advise if patient options and if samples available

## 2023-02-28 NOTE — Telephone Encounter (Signed)
Pt c/o medication issue:  1. Name of Medication:  empagliflozin (JARDIANCE) 10 MG TABS tablet apixaban (ELIQUIS) 5 MG TABS tablet  2. How are you currently taking this medication (dosage and times per day)?   3. Are you having a reaction (difficulty breathing--STAT)?   4. What is your medication issue?   Patient states she only has 1-2 tablets of Jardiance and Eliquis and she is in the donut hole so she will be charged $289/month for both medications. She would like to discuss patient assistance options.

## 2023-03-01 MED ORDER — APIXABAN 5 MG PO TABS: 5.0000 mg | ORAL_TABLET | Freq: Two times a day (BID) | ORAL | 0 refills | Status: DC

## 2023-03-01 MED ORDER — EMPAGLIFLOZIN 10 MG PO TABS: 10.0000 mg | ORAL_TABLET | Freq: Every day | ORAL | 0 refills | Status: DC

## 2023-03-01 NOTE — Telephone Encounter (Signed)
Call to patient. Advised patient that she can fill out patient assistance forms.  She can pick up samples at the office.  Advised to bring completed forms and W2 statement asap to get the process started.  Forms were placed with samples.

## 2023-03-01 NOTE — Telephone Encounter (Signed)
Eliquis pt assistance application: GreaterMargins.[email or web address].[email or web address]  Jardiance patient assistance form: [email or web address]://[email or web address]_[email or web address]_[email or web address]  Ok to give a week or two of Eliquis samples while application is pending, her dose is correct. Not sure if office carries Jardiance samples but ok to give if they are available.

## 2023-03-13 ENCOUNTER — Telehealth: Payer: Self-pay | Admitting: Cardiovascular Disease

## 2023-03-13 NOTE — Telephone Encounter (Signed)
Paper Work Dropped Off: [employer] Assistance  Date:03/13/2023  Location of paper: Dr.C mailbox

## 2023-03-13 NOTE — Telephone Encounter (Signed)
Routed to [employer]

## 2023-03-15 ENCOUNTER — Telehealth: Payer: Self-pay | Admitting: Emergency Medicine

## 2023-03-15 NOTE — Telephone Encounter (Signed)
Informed the patient that I faxed in both of the pt assistance applications.   Eliquis- requires proof of income and many times requests a print out of your prescription expenses so far for this year. Informed her that she can request this print out from her pharmacy.   She will work on getting these items and bring them in for [place] to fax.

## 2023-03-15 NOTE — Telephone Encounter (Signed)
Faxed patient assistance application for Eliquis and for Jardiance

## 2023-03-22 ENCOUNTER — Telehealth: Payer: Self-pay | Admitting: Emergency Medicine

## 2023-03-22 NOTE — Telephone Encounter (Signed)
2nd attempt- fax requesting pt's most recent labs- cbc and bmp

## 2023-04-10 ENCOUNTER — Telehealth: Payer: Self-pay | Admitting: Emergency Medicine

## 2023-04-10 MED ORDER — APIXABAN 5 MG PO TABS: 5.0000 mg | ORAL_TABLET | Freq: Two times a day (BID) | ORAL | Status: DC

## 2023-04-10 MED ORDER — EMPAGLIFLOZIN 10 MG PO TABS: 10.0000 mg | ORAL_TABLET | Freq: Every day | ORAL | Status: DC

## 2023-04-10 NOTE — Telephone Encounter (Signed)
Spoke to [employer], [facility]- she verified dosages of medications and said it was ok to give a few weeks of Eliquis and 4 weeks of Jardiance.  Called the patient and informed her that the samples are ready for her at the front desk. She plans to pick them up today. She says that her daughter is working on getting her pharmacy expense print out as well.

## 2023-04-10 NOTE — Telephone Encounter (Signed)
Gave the patient the information (see previous documentation) She states that the last prescription she got for Eliquis cost $500. She knows that she has not met the required out of pocket amount. She will ask her daughter to get a print out of her expenses so far this year and see how much that comes up to.  In the meantime, she only has a few days left of her Eliquis and Jardiance. She will not be able to pay another $500 for a prescription. She is asking for samples. I told her that I would ask our Pharmacist and get back in touch with her. She verbalized understanding.

## 2023-04-10 NOTE — Addendum Note (Signed)
Addended by: Scheryl Marten on: 04/10/2023 02:27 PM   Modules accepted: Orders

## 2023-04-10 NOTE — Telephone Encounter (Signed)
Left message with call back number.  Called her daughterDenzil Magnuson (dpr)- she will send her mom a message to return call.  Information: Received Denial letter for Eliquis patient assistance:  Need documentation of 3%  out of pocket prescription expenses. This amount comes to $1,012.22. You can include your other household member's prescription expenses as well (on application says HH=2)  Once your total out of pocket expenses reaches this amount, we can fax the documentation to them. (Go to your pharmacy or call them to ask for a print out of your total prescription expenses for this year, as well as your other household member)

## 2023-07-18 ENCOUNTER — Telehealth: Payer: Self-pay | Admitting: Cardiovascular Disease

## 2023-07-18 MED ORDER — FUROSEMIDE 40 MG PO TABS: ORAL_TABLET | ORAL | 3 refills | Status: DC

## 2023-07-18 NOTE — Telephone Encounter (Signed)
 Spoke with pt, aware of recommendations and repeated medication directions. Patient voiced understanding to reach out to the office later this week with an update.

## 2023-07-18 NOTE — Telephone Encounter (Signed)
 Spoke with pt, she reports swelling in her feet and legs for a couple weeks now. She reports they are swollen when she gets up and she reports SOB when walking to the bathroom and esp when bending over. Her weight this morning was 260 lbs. When she first lays down she is SOB and feels her heart racing but once she gets settled, she is fine. Advised the patient to take an extra 1.5 tablets of the furosemide  now but she reports she has not taken her furosemide  yet today. Advised to take furosemide  now and I will make dr croitoru aware.

## 2023-07-18 NOTE — Telephone Encounter (Signed)
 Pt c/o swelling/edema: STAT if pt has developed SOB within 24 hours  If swelling, where is the swelling located? Legs/feet/hands  How much weight have you gained and in what time span? 10 lbs 2 weeks  Have you gained 2 pounds in a day or 5 pounds in a week? yes  Do you have a log of your daily weights (if so, list)? Not really  Are you currently taking a fluid pill? yes  Are you currently SOB? Only when walking  Have you traveled recently in a car or plane for an extended period of time? no

## 2023-07-18 NOTE — Telephone Encounter (Signed)
 Please make sure that she is taking her Jardiance .  Have her check and make sure that the foods she has been eating recently are not higher in sodium than usual. Please ask her to increase her furosemide  to 60 mg twice daily and increase her potassium chloride  to 20 mill equivalents twice daily (take them in the morning and then the second dose in the early afternoon).  Do that for 3 days or until she loses the 10 pounds that she has gained.  Check back with us  at the end of the week please Thank you

## 2023-07-26 ENCOUNTER — Telehealth: Payer: Self-pay | Admitting: Cardiovascular Disease

## 2023-07-26 NOTE — Telephone Encounter (Signed)
Pt wakes up every morning with her legs being red and has blisters. She wants to know if this is coming from her heart diease

## 2023-07-26 NOTE — Telephone Encounter (Signed)
Called and spoke to patient. Verified name and DOB. Patient report for the last month she's  been having blisters and redness to both legs that would come and go. She states they are from her ankle up to her calf.She is also having some swelling. Patient stated today were the worse she's seen them. She deny they are warm to touch but they are itchy at times. She deny any other symptoms like chest pain, SOB lightheadedness and dizziness. She was advised to call her PCP to be seen. Patient verbalized understanding and agrees.

## 2023-08-10 ENCOUNTER — Other Ambulatory Visit: Payer: Self-pay | Admitting: Student

## 2023-08-10 DIAGNOSIS — I70203 Unspecified atherosclerosis of native arteries of extremities, bilateral legs: Secondary | ICD-10-CM

## 2023-08-16 ENCOUNTER — Ambulatory Visit
Admission: RE | Admit: 2023-08-16 | Discharge: 2023-08-16 | Disposition: A | Discharge status: Home / Self Care | Payer: Medicare HMO | Source: Ambulatory Visit | Attending: Student | Admitting: Student

## 2023-08-16 DIAGNOSIS — I70203 Unspecified atherosclerosis of native arteries of extremities, bilateral legs: Secondary | ICD-10-CM

## 2023-08-24 ENCOUNTER — Other Ambulatory Visit: Payer: Self-pay

## 2023-08-24 MED ORDER — FUROSEMIDE 40 MG PO TABS: ORAL_TABLET | ORAL | 1 refills | Status: DC

## 2023-08-30 ENCOUNTER — Encounter: Payer: Self-pay | Admitting: Cardiovascular Disease

## 2023-08-30 ENCOUNTER — Ambulatory Visit: Payer: Medicare HMO | Attending: Cardiovascular Disease | Admitting: Cardiovascular Disease

## 2023-08-30 VITALS — BP 112/82 | HR 110 | Ht 62.0 in | Wt 238.0 lb

## 2023-08-30 DIAGNOSIS — I5032 Chronic diastolic (congestive) heart failure: Secondary | ICD-10-CM

## 2023-08-30 DIAGNOSIS — R7303 Prediabetes: Secondary | ICD-10-CM | POA: Diagnosis not present

## 2023-08-30 DIAGNOSIS — I700 Atherosclerosis of aorta: Secondary | ICD-10-CM

## 2023-08-30 DIAGNOSIS — I4819 Other persistent atrial fibrillation: Secondary | ICD-10-CM

## 2023-08-30 DIAGNOSIS — E785 Hyperlipidemia, unspecified: Secondary | ICD-10-CM

## 2023-08-30 DIAGNOSIS — N1831 Chronic kidney disease, stage 3a: Secondary | ICD-10-CM

## 2023-08-30 MED ORDER — METOPROLOL SUCCINATE ER 50 MG PO TB24: ORAL_TABLET | ORAL | 3 refills | Status: DC

## 2023-08-30 NOTE — Patient Instructions (Addendum)
 Medication Instructions:  - INCREASE metoprolol succinate to 100mg  (2 tablets) in the morning and 50mg  (1 tablet) in the evening - STOP losartan  *If you need a refill on your cardiac medications before your next appointment, please call your pharmacy*  Lab Work: - None ordered  Testing/Procedures: - None ordered  Follow-Up: At [facility], you and your health needs are our priority.  As part of our continuing mission to provide you with exceptional heart care, we have created designated Provider Care Teams.  These Care Teams include your primary Cardiologist (physician) and Advanced Practice Providers (APPs -  Physician Assistants and Nurse Practitioners) who all work together to provide you with the care you need, when you need it.  We recommend signing up for the patient portal called "MyChart".  Sign up information is provided on this After Visit Summary.  MyChart is used to connect with patients for Virtual Visits (Telemedicine).  Patients are able to view lab/test results, encounter notes, upcoming appointments, etc.  Non-urgent messages can be sent to your provider as well.   To learn more about what you can do with MyChart, go to [email or web address].    Your next appointment:   6-8 week(s)   Provider:   With an Advanced Practice Provider. Then, Thurmon Fair, MD will plan to see you again in 1 year(s).     Other info:

## 2023-08-30 NOTE — Progress Notes (Signed)
 Cardiology Office Note:    Date:  09/03/2023   ID:  Julia Mcfarland, DOB 1945-12-14, MRN [id number]  PCP:  Hillery Aldo, NP  [facility] Cardiologist:  Thurmon Fair, MD  [facility] Electrophysiologist:  None   Referring MD: Hillery Aldo, NP   Chief Complaint  Patient presents with   Atrial Fibrillation    History of Present Illness:    Julia Mcfarland is a 78 y.o. female with a hx of obesity and leg edema, who had paroxysmal atrial fibrillation during the hospitalization with COVID-19 pneumonia in 2021.  Unfortunately her husband died of Covid infection around that time as well.  She stopped taking amiodarone after a few weeks, but was continued on anticoagulation since a follow-up monitor did not show lengthy episodes of paroxysmal atrial fibrillation.  She was then lost to follow-up until she was hospitalized in January 2024 with congestive heart failure and atrial fibrillation with rapid ventricular response.  She was unaware of the arrhythmia.  Her echocardiogram showed normal LVEF 60-65% and severe biatrial dilation, moderate to severe MR and severe TR.    Doing okay, but does become dyspneic if she becomes upset.  Otherwise can do household chores without difficulty.  Denies chest pain, dizziness, palpitations or focal neurological complaints.  Has not had any falls or bleeding problems while being compliant with anticoagulation.  Switch from Eliquis to generic dabigatran due to cost.  Denies any GI problems  Workup has included a pyrophosphate scan that was equivocal for amyloidosis, no evidence of abnormal light chains and a subsequent cardiac MRI that showed biatrial dilation but no evidence of amyloidosis or myocardial scarring.  There was moderate central mitral regurgitation was felt to be secondary to atrial functional mechanism.  She does not currently have any edema.  Her weight is 238 pounds, very similar to her last clinic appointment.  She denies orthopnea and PND.   She denies daytime hypersomnolence.  She has been on a steady dose of furosemide 60 mg daily and also takes Jardiance.  In the past she has been on digoxin but this was discontinued when she had a high level and there was still concern for possible amyloidosis.  She did not tolerate Entresto due to generalized itching but does take a very low-dose of ARB.  Her creatinine was borderline abnormal at 1.24 a year ago and her hemoglobin was 14.  She is to have labs with her PCP.  Past Medical History:  Diagnosis Date   Allergy    Arthritis    Chicken pox    Chronic venous insufficiency    a. Uses furosemide prn.   Degenerative joint disease    Morbid obesity (HCC)    Osteoarthritis    PONV (postoperative nausea and vomiting)     Past Surgical History:  Procedure Laterality Date   ABDOMINAL HYSTERECTOMY  1988   CARPAL TUNNEL RELEASE Right    COLONOSCOPY WITH PROPOFOL N/A 09/01/2020   Procedure: COLONOSCOPY WITH PROPOFOL;  Surgeon: Vida Rigger, MD;  Location: [facility];  Service: Endoscopy;  Laterality: N/A;   POLYPECTOMY  09/01/2020   Procedure: POLYPECTOMY;  Surgeon: Vida Rigger, MD;  Location: [facility];  Service: Endoscopy;;   REPLACEMENT TOTAL KNEE Right 2010   TOTAL KNEE ARTHROPLASTY Left 03/07/2017   Procedure: LEFT TOTAL KNEE ARTHROPLASTY;  Surgeon: Cammy Copa, MD;  Location: [facility] OR;  Service: Orthopedics;  Laterality: Left;    Current Medications: Current Meds  Medication Sig   Ascorbic Acid (VITAMIN C)  1000 MG tablet Take 1,000 mg by mouth daily.   cetirizine (ZYRTEC) 10 MG tablet Take 10 mg by mouth daily.   Cholecalciferol (VITAMIN D3) 5000 UNITS CAPS Take 5,000 Units by mouth daily.    dabigatran (PRADAXA) 150 MG CAPS capsule Take 150 mg by mouth 2 (two) times daily.   empagliflozin (JARDIANCE) 10 MG TABS tablet Take 1 tablet (10 mg total) by mouth daily before breakfast.   furosemide (LASIX) 40 MG tablet Take 1.5 tablets once daily and may take extra 1.5 tablets  as needed for swelling   hydroxypropyl methylcellulose / hypromellose (ISOPTO TEARS / GONIOVISC) 2.5 % ophthalmic solution Place 1 drop into both eyes daily as needed for dry eyes.   metoprolol succinate (TOPROL XL) 50 MG 24 hr tablet Take 2 tablets (100 mg total) by mouth every morning AND 1 tablet (50 mg total) every evening. Take with or immediately following a meal..   potassium chloride SA (KLOR-CON M) 20 MEQ tablet Take 1 tablet (20 mEq total) by mouth daily.   vitamin B-12 (CYANOCOBALAMIN) 500 MCG tablet Take 500 mcg by mouth daily.   [DISCONTINUED] losartan (COZAAR) 25 MG tablet Take 0.5 tablets (12.5 mg total) by mouth daily.   [DISCONTINUED] metoprolol succinate (TOPROL-XL) 100 MG 24 hr tablet Take 1 tablet (100 mg total) by mouth daily.     Allergies:   Patient has no known allergies.   Family History: The patient's family history includes Arthritis in her mother; Diabetes in her mother and paternal grandmother; Heart disease in her maternal grandfather, mother, and paternal grandfather; Hypertension in her father, mother, and sister; Stroke in her father and sister. There is no history of Breast cancer.    EKGs/Labs/Other Studies Reviewed:    The following studies were reviewed today: Echo 10/29/2019  1. Normal LV function; mild LAE.   2. Left ventricular ejection fraction, by estimation, is 55 to 60%. The  left ventricle has normal function. The left ventricle has no regional  wall motion abnormalities. Left ventricular diastolic function could not  be evaluated.   3. Right ventricular systolic function is normal. The right ventricular  size is normal. Tricuspid regurgitation signal is inadequate for assessing  PA pressure.   4. Left atrial size was mildly dilated.   5. The mitral valve is normal in structure. Trivial mitral valve  regurgitation. No evidence of mitral stenosis.   6. The aortic valve is tricuspid. Aortic valve regurgitation is not  visualized. No aortic  stenosis is present.   7. The inferior vena cava is normal in size with greater than 50%  respiratory variability, suggesting right atrial pressure of 3 mmHg.   MRI 11/16/2022  IMPRESSION: 1.  Severe bi atrial enlargement no LAA thrombus   2.  Normal LV size and function LVEF 52%   3.  Mild RVE with low normal function RVEF 48%   4. Delayed enhancement images with normal nulling and no gadolinium uptake No evidence of amyloid   5.  Moderate appearing atrial functional MR   6.  Normal parametric measures   7.  Normal cardiac output 6.3 L/min   EKG:  The ekg ordered 08/24/2022 demonstrates atrial fibrillation w RVR   EKG Interpretation Date/Time:  Wednesday August 30 2023 09:24:51 EST Ventricular Rate:  110 PR Interval:    QRS Duration:  68 QT Interval:  348 QTC Calculation: 470 R Axis:   -23  Text Interpretation: Atrial fibrillation with rapid ventricular response When compared with ECG of 24-Aug-2022 10:03,  Criteria for Septal infarct are no longer Present Inverted T waves have replaced nonspecific T wave abnormality in Inferior leads Confirmed by Agnes Brightbill ([id number]) on 08/30/2023 9:43:48 AM         Recent Labs: 10/05/2022: BUN 16; Creatinine, Ser 1.24; Hemoglobin 14.0; Platelets 226; Potassium 3.8; Sodium 139  Recent Lipid Panel    Component Value Date/Time   CHOL 191 03/19/2020 1112   TRIG 55.0 03/19/2020 1112   HDL 67.50 03/19/2020 1112   CHOLHDL 3 03/19/2020 1112   VLDL 11.0 03/19/2020 1112   LDLCALC 112 (H) 03/19/2020 1112   LDLDIRECT 113.6 08/14/2013 1557    Physical Exam:    VS:  BP 112/82 (BP Location: Left Arm, Patient Position: Sitting)   Pulse (!) 110   Ht 5\' 2"  (1.575 m)   Wt 238 lb (108 kg)   SpO2 95%   BMI 43.53 kg/m     Wt Readings from Last 3 Encounters:  08/30/23 238 lb (108 kg)  02/01/23 237 lb 3.2 oz (107.6 kg)  10/05/22 244 lb (110.7 kg)      General: Alert, oriented x3, no distress, morbidly obese Head: no evidence of  trauma, PERRL, EOMI, no exophtalmos or lid lag, no myxedema, no xanthelasma; normal ears, nose and oropharynx Neck: normal jugular venous pulsations and no hepatojugular reflux; brisk carotid pulses without delay and no carotid bruits Chest: clear to auscultation, no signs of consolidation by percussion or palpation, normal fremitus, symmetrical and full respiratory excursions Cardiovascular: normal position and quality of the apical impulse, irregular rhythm, normal first and second heart sounds, 1/6 holosystolic murmur at the left lower sternal border, no diastolic murmurs, rubs or gallops Abdomen: no tenderness or distention, no masses by palpation, no abnormal pulsatility or arterial bruits, normal bowel sounds, no hepatosplenomegaly Extremities: no clubbing, cyanosis or edema; 2+ radial, ulnar and brachial pulses bilaterally; 2+ right femoral, posterior tibial and dorsalis pedis pulses; 2+ left femoral, posterior tibial and dorsalis pedis pulses; no subclavian or femoral bruits Neurological: grossly nonfocal Psych: Normal mood and affect    ASSESSMENT:    1. Persistent atrial fibrillation (HCC)   2. Chronic diastolic heart failure (HCC)   3. Morbid obesity (HCC)   4. Prediabetes   5. Dyslipidemia (high LDL; low HDL)   6. Atherosclerosis of aorta (HCC)   7. Stage 3a chronic kidney disease (HCC)    PLAN:    In order of problems listed above:  AFib: She is appropriately anticoagulated and asymptomatic, but rate control remains inadequate.  Increase the metoprolol succinate to a total of 150 mg daily.  Since she has borderline low blood pressure and has had issues with hypotension in the past will stop her losartan.  Had problems with digoxin in the past.  She has morbid obesity and has severe biatrial dilation as well as valvular abnormalities.  The likelihood of long-term maintenance of sinus rhythm is poor.  We could consider starting her back on amiodarone and performing a  cardioversion, but probably best managed as permanent atrial fibrillation CHA2DS2-VASc score is 52 (age 86, gender, HF), may be 5 if aortic atherosclerosis is considered equivalent for PAD.   CHF: She appears to be clinically euvolemic and has maintained a stable weight on relatively low-dose loop diuretic plus Jardiance.   No evidence of amyloidosis on MRI.  Heart failure is primarily due to obesity and poorly controlled ventricular rates in atrial fibrillation. Morbid obesity: Has not lost any real weight. PreDM: Previous workup has shown glucose levels  in prediabetes range and a low HDL cholesterol.  Do not have recent labs.. Aortic atherosclerosis/coronary calcifications: Not had angina or claudication or any clinical evidence of significant CAD or PAD. CKD3: Renal function appears to be at baseline.  Baseline creatinine seems to be around 1.1-1.2, corresponding to GFR 45-50.    Medication Adjustments/Labs and Tests Ordered: Current medicines are reviewed at length with the patient today.  Concerns regarding medicines are outlined above.  Orders Placed This Encounter  Procedures   EKG 12-Lead   Meds ordered this encounter  Medications   metoprolol succinate (TOPROL XL) 50 MG 24 hr tablet    Sig: Take 2 tablets (100 mg total) by mouth every morning AND 1 tablet (50 mg total) every evening. Take with or immediately following a meal..    Dispense:  270 tablet    Refill:  3    Patient Instructions  Medication Instructions:  - INCREASE metoprolol succinate to 100mg  (2 tablets) in the morning and 50mg  (1 tablet) in the evening - STOP losartan  *If you need a refill on your cardiac medications before your next appointment, please call your pharmacy*  Lab Work: - None ordered  Testing/Procedures: - None ordered  Follow-Up: At [facility], you and your health needs are our priority.  As part of our continuing mission to provide you with exceptional heart care, we have created  designated Provider Care Teams.  These Care Teams include your primary Cardiologist (physician) and Advanced Practice Providers (APPs -  Physician Assistants and Nurse Practitioners) who all work together to provide you with the care you need, when you need it.  We recommend signing up for the patient portal called "MyChart".  Sign up information is provided on this After Visit Summary.  MyChart is used to connect with patients for Virtual Visits (Telemedicine).  Patients are able to view lab/test results, encounter notes, upcoming appointments, etc.  Non-urgent messages can be sent to your provider as well.   To learn more about what you can do with MyChart, go to [email or web address].    Your next appointment:   6-8 week(s)   Provider:   With an Advanced Practice Provider. Then, Thurmon Fair, MD will plan to see you again in 1 year(s).     Other info:     Signed, Thurmon Fair, MD  09/03/2023 6:06 PM    [facility]

## 2023-09-06 ENCOUNTER — Other Ambulatory Visit: Payer: Self-pay | Admitting: Student

## 2023-09-06 DIAGNOSIS — Z1231 Encounter for screening mammogram for malignant neoplasm of breast: Secondary | ICD-10-CM

## 2023-09-13 ENCOUNTER — Encounter (HOSPITAL_COMMUNITY): Payer: Self-pay | Admitting: *Deleted

## 2023-09-13 ENCOUNTER — Encounter (HOSPITAL_COMMUNITY): Payer: Self-pay | Admitting: Emergency Medicine

## 2023-09-13 ENCOUNTER — Other Ambulatory Visit: Payer: Self-pay

## 2023-09-13 ENCOUNTER — Emergency Department (HOSPITAL_COMMUNITY)

## 2023-09-13 ENCOUNTER — Telehealth: Payer: Self-pay | Admitting: Cardiovascular Disease

## 2023-09-13 ENCOUNTER — Ambulatory Visit (HOSPITAL_BASED_OUTPATIENT_CLINIC_OR_DEPARTMENT_OTHER): Admitting: Student

## 2023-09-13 ENCOUNTER — Telehealth (HOSPITAL_BASED_OUTPATIENT_CLINIC_OR_DEPARTMENT_OTHER): Payer: Self-pay | Admitting: Student

## 2023-09-13 ENCOUNTER — Inpatient Hospital Stay (HOSPITAL_COMMUNITY)
Admission: EM | Admit: 2023-09-13 | Discharge: 2023-09-23 | DRG: 291 | Disposition: A | Discharge status: Home / Self Care | Attending: Internal Medicine | Admitting: Internal Medicine

## 2023-09-13 ENCOUNTER — Ambulatory Visit (HOSPITAL_BASED_OUTPATIENT_CLINIC_OR_DEPARTMENT_OTHER)

## 2023-09-13 ENCOUNTER — Ambulatory Visit (HOSPITAL_COMMUNITY): Admission: EM | Admit: 2023-09-13 | Discharge: 2023-09-13 | Disposition: A | Discharge status: Home / Self Care

## 2023-09-13 DIAGNOSIS — I340 Nonrheumatic mitral (valve) insufficiency: Secondary | ICD-10-CM | POA: Insufficient documentation

## 2023-09-13 DIAGNOSIS — Z7902 Long term (current) use of antithrombotics/antiplatelets: Secondary | ICD-10-CM

## 2023-09-13 DIAGNOSIS — E876 Hypokalemia: Secondary | ICD-10-CM | POA: Diagnosis not present

## 2023-09-13 DIAGNOSIS — M25561 Pain in right knee: Secondary | ICD-10-CM

## 2023-09-13 DIAGNOSIS — I482 Chronic atrial fibrillation, unspecified: Secondary | ICD-10-CM | POA: Diagnosis present

## 2023-09-13 DIAGNOSIS — I9581 Postprocedural hypotension: Secondary | ICD-10-CM | POA: Diagnosis not present

## 2023-09-13 DIAGNOSIS — I071 Rheumatic tricuspid insufficiency: Secondary | ICD-10-CM | POA: Diagnosis present

## 2023-09-13 DIAGNOSIS — I5033 Acute on chronic diastolic (congestive) heart failure: Secondary | ICD-10-CM

## 2023-09-13 DIAGNOSIS — E039 Hypothyroidism, unspecified: Secondary | ICD-10-CM | POA: Diagnosis present

## 2023-09-13 DIAGNOSIS — R600 Localized edema: Secondary | ICD-10-CM

## 2023-09-13 DIAGNOSIS — I509 Heart failure, unspecified: Secondary | ICD-10-CM

## 2023-09-13 DIAGNOSIS — Z90710 Acquired absence of both cervix and uterus: Secondary | ICD-10-CM

## 2023-09-13 DIAGNOSIS — X58XXXA Exposure to other specified factors, initial encounter: Secondary | ICD-10-CM | POA: Diagnosis present

## 2023-09-13 DIAGNOSIS — I110 Hypertensive heart disease with heart failure: Secondary | ICD-10-CM | POA: Diagnosis not present

## 2023-09-13 DIAGNOSIS — I2510 Atherosclerotic heart disease of native coronary artery without angina pectoris: Secondary | ICD-10-CM | POA: Diagnosis present

## 2023-09-13 DIAGNOSIS — Z7984 Long term (current) use of oral hypoglycemic drugs: Secondary | ICD-10-CM

## 2023-09-13 DIAGNOSIS — M25562 Pain in left knee: Secondary | ICD-10-CM | POA: Diagnosis not present

## 2023-09-13 DIAGNOSIS — R001 Bradycardia, unspecified: Secondary | ICD-10-CM | POA: Diagnosis not present

## 2023-09-13 DIAGNOSIS — Z96652 Presence of left artificial knee joint: Secondary | ICD-10-CM | POA: Diagnosis not present

## 2023-09-13 DIAGNOSIS — I130 Hypertensive heart and chronic kidney disease with heart failure and stage 1 through stage 4 chronic kidney disease, or unspecified chronic kidney disease: Principal | ICD-10-CM | POA: Diagnosis present

## 2023-09-13 DIAGNOSIS — I272 Pulmonary hypertension, unspecified: Secondary | ICD-10-CM | POA: Diagnosis present

## 2023-09-13 DIAGNOSIS — N179 Acute kidney failure, unspecified: Secondary | ICD-10-CM

## 2023-09-13 DIAGNOSIS — I4819 Other persistent atrial fibrillation: Secondary | ICD-10-CM | POA: Diagnosis present

## 2023-09-13 DIAGNOSIS — I872 Venous insufficiency (chronic) (peripheral): Secondary | ICD-10-CM | POA: Diagnosis present

## 2023-09-13 DIAGNOSIS — Z96653 Presence of artificial knee joint, bilateral: Secondary | ICD-10-CM | POA: Diagnosis present

## 2023-09-13 DIAGNOSIS — E66813 Obesity, class 3: Secondary | ICD-10-CM | POA: Diagnosis present

## 2023-09-13 DIAGNOSIS — N1831 Chronic kidney disease, stage 3a: Secondary | ICD-10-CM | POA: Diagnosis present

## 2023-09-13 DIAGNOSIS — S80821A Blister (nonthermal), right lower leg, initial encounter: Secondary | ICD-10-CM | POA: Diagnosis present

## 2023-09-13 DIAGNOSIS — M7989 Other specified soft tissue disorders: Secondary | ICD-10-CM | POA: Diagnosis present

## 2023-09-13 DIAGNOSIS — I4891 Unspecified atrial fibrillation: Secondary | ICD-10-CM

## 2023-09-13 DIAGNOSIS — Z833 Family history of diabetes mellitus: Secondary | ICD-10-CM

## 2023-09-13 DIAGNOSIS — Z8249 Family history of ischemic heart disease and other diseases of the circulatory system: Secondary | ICD-10-CM

## 2023-09-13 DIAGNOSIS — Z8616 Personal history of COVID-19: Secondary | ICD-10-CM

## 2023-09-13 DIAGNOSIS — E0590 Thyrotoxicosis, unspecified without thyrotoxic crisis or storm: Secondary | ICD-10-CM

## 2023-09-13 DIAGNOSIS — S80822A Blister (nonthermal), left lower leg, initial encounter: Secondary | ICD-10-CM | POA: Diagnosis present

## 2023-09-13 DIAGNOSIS — I081 Rheumatic disorders of both mitral and tricuspid valves: Secondary | ICD-10-CM | POA: Diagnosis present

## 2023-09-13 DIAGNOSIS — Z6841 Body Mass Index (BMI) 40.0 and over, adult: Secondary | ICD-10-CM

## 2023-09-13 DIAGNOSIS — Z79899 Other long term (current) drug therapy: Secondary | ICD-10-CM

## 2023-09-13 LAB — CBC
HCT: 41.9 % (ref 36.0–46.0)
Hemoglobin: 13.3 g/dL (ref 12.0–15.0)
MCH: 30.7 pg (ref 26.0–34.0)
MCHC: 31.7 g/dL (ref 30.0–36.0)
MCV: 96.8 fL (ref 80.0–100.0)
Platelets: 194 10*3/uL (ref 150–400)
RBC: 4.33 MIL/uL (ref 3.87–5.11)
RDW: 14.6 % (ref 11.5–15.5)
WBC: 5.6 10*3/uL (ref 4.0–10.5)
nRBC: 0 % (ref 0.0–0.2)

## 2023-09-13 LAB — BASIC METABOLIC PANEL
Anion gap: 11 (ref 5–15)
BUN: 15 mg/dL (ref 8–23)
CO2: 26 mmol/L (ref 22–32)
Calcium: 9 mg/dL (ref 8.9–10.3)
Chloride: 105 mmol/L (ref 98–111)
Creatinine, Ser: 0.84 mg/dL (ref 0.44–1.00)
GFR, Estimated: >60 mL/min (ref >60)
Glucose, Bld: 111 mg/dL — ABNORMAL HIGH (ref 70–99)
Potassium: 4 mmol/L (ref 3.5–5.1)
Sodium: 142 mmol/L (ref 135–145)

## 2023-09-13 LAB — MAGNESIUM: Magnesium: 2.2 mg/dL (ref 1.7–2.4)

## 2023-09-13 LAB — TROPONIN I (HIGH SENSITIVITY): Troponin I (High Sensitivity): 5 ng/L (ref <18)

## 2023-09-13 LAB — BRAIN NATRIURETIC PEPTIDE: B Natriuretic Peptide: 359.3 pg/mL — ABNORMAL HIGH (ref 0.0–100.0)

## 2023-09-13 NOTE — Telephone Encounter (Signed)
 Pt c/o swelling: STAT is pt has developed SOB within 24 hours  How much weight have you gained and in what time span? Patient stated she hit her leg on the car door (on 08/30/23) and that's when the swelling started to increase.  If swelling, where is the swelling located? In her legs  Are you currently taking a fluid pill? Yes, Furosemide 40MG  1.5 tablets daily  Are you currently SOB? No Do you have a log of your daily weights (if so, list)? No  Have you gained 3 pounds in a day or 5 pounds in a week? Patient stated she has not been able weigh herself but can tell she has gained weight  Have you traveled recently? No

## 2023-09-13 NOTE — Progress Notes (Signed)
 Chief Complaint: Bilateral knee swelling     History of Present Illness:    Julia Mcfarland is a 78 y.o. female presenting today for evaluation of swelling in both knees.  She states that she feels like she has fluid buildup which extends all the way down towards the ankles.  This began a few weeks ago and states that since then her legs have felt "tight".  She does have a history of persistent A-fib and chronic diastolic heart failure for which she did see her cardiologist in the late February.  Symptoms began shortly after.  She does take furosemide daily.  Has history of bilateral knee replacements by Dr. August Saucer.  States that today she is not in any significant pain but does note that occasionally she has buckling of her right knee.   Surgical History:   R TKA 2010 L TKA 2018  PMH/PSH/Family History/Social History/Meds/Allergies:    Past Medical History:  Diagnosis Date   Allergy    Arthritis    Chicken pox    Chronic venous insufficiency    a. Uses furosemide prn.   Degenerative joint disease    Morbid obesity (HCC)    Osteoarthritis    PONV (postoperative nausea and vomiting)    Past Surgical History:  Procedure Laterality Date   ABDOMINAL HYSTERECTOMY  1988   CARPAL TUNNEL RELEASE Right    COLONOSCOPY WITH PROPOFOL N/A 09/01/2020   Procedure: COLONOSCOPY WITH PROPOFOL;  Surgeon: Vida Rigger, MD;  Location: [facility];  Service: Endoscopy;  Laterality: N/A;   POLYPECTOMY  09/01/2020   Procedure: POLYPECTOMY;  Surgeon: Vida Rigger, MD;  Location: [facility];  Service: Endoscopy;;   REPLACEMENT TOTAL KNEE Right 2010   TOTAL KNEE ARTHROPLASTY Left 03/07/2017   Procedure: LEFT TOTAL KNEE ARTHROPLASTY;  Surgeon: Cammy Copa, MD;  Location: [facility] OR;  Service: Orthopedics;  Laterality: Left;   Social History   Socioeconomic History   Marital status: Married    Spouse name: Not on file   Number of children: Not on file   Years of  education: Not on file   Highest education level: Not on file  Occupational History   Not on file  Tobacco Use   Smoking status: Never   Smokeless tobacco: Never  Vaping Use   Vaping status: Never Used  Substance and Sexual Activity   Alcohol use: Yes    Comment: occasional   Drug use: No   Sexual activity: Never  Other Topics Concern   Not on file  Social History Narrative   Lives in [place] w/ husband.  Says she's relatively active but does not routinely exercise.   Social Drivers of Corporate investment banker Strain: Low Risk  (08/05/2022)   Overall Financial Resource Strain (CARDIA)    Difficulty of Paying Living Expenses: Not very hard  Food Insecurity: No Food Insecurity (08/03/2022)   Hunger Vital Sign    Worried About Running Out of Food in the Last Year: Never true    Ran Out of Food in the Last Year: Never true  Transportation Needs: No Transportation Needs (08/03/2022)   PRAPARE - Administrator, Civil Service (Medical): No    Lack of Transportation (Non-Medical): No  Physical Activity: Not on file  Stress: Not on file  Social Connections: Not on  file   Family History  Problem Relation Age of Onset   Arthritis Mother    Heart disease Mother    Hypertension Mother    Diabetes Mother    Stroke Father    Hypertension Father    Stroke Sister    Hypertension Sister    Heart disease Maternal Grandfather    Diabetes Paternal Grandmother    Heart disease Paternal Grandfather    Breast cancer Neg Hx    No Known Allergies Current Outpatient Medications  Medication Sig Dispense Refill   albuterol (VENTOLIN HFA) 108 (90 Base) MCG/ACT inhaler INHALE 2 PUFFS INTO LUNGS EVERY 6 HOURS AS NEEDED FOR WHEEZING OR SHORTNESS OF BREATH (Patient not taking: Reported on 08/30/2023) 6.7 each 1   Ascorbic Acid (VITAMIN C) 1000 MG tablet Take 1,000 mg by mouth daily.     cetirizine (ZYRTEC) 10 MG tablet Take 10 mg by mouth daily.     Cholecalciferol (VITAMIN D3)  5000 UNITS CAPS Take 5,000 Units by mouth daily.      dabigatran (PRADAXA) 150 MG CAPS capsule Take 150 mg by mouth 2 (two) times daily.     empagliflozin (JARDIANCE) 10 MG TABS tablet Take 1 tablet (10 mg total) by mouth daily before breakfast. 28 tablet    furosemide (LASIX) 40 MG tablet Take 1.5 tablets once daily and may take extra 1.5 tablets as needed for swelling 270 tablet 1   hydroxypropyl methylcellulose / hypromellose (ISOPTO TEARS / GONIOVISC) 2.5 % ophthalmic solution Place 1 drop into both eyes daily as needed for dry eyes.     metoprolol succinate (TOPROL XL) 50 MG 24 hr tablet Take 2 tablets (100 mg total) by mouth every morning AND 1 tablet (50 mg total) every evening. Take with or immediately following a meal.. 270 tablet 3   polyethylene glycol (MIRALAX / GLYCOLAX) 17 g packet Take 17 g by mouth daily as needed for mild constipation. (Patient not taking: Reported on 08/30/2023)     potassium chloride SA (KLOR-CON M) 20 MEQ tablet Take 1 tablet (20 mEq total) by mouth daily. 30 tablet 11   vitamin B-12 (CYANOCOBALAMIN) 500 MCG tablet Take 500 mcg by mouth daily.     No current facility-administered medications for this visit.   No results found.  Review of Systems:   A ROS was performed including pertinent positives and negatives as documented in the HPI.  Physical Exam :   Constitutional: NAD and appears stated age Neurological: Alert and oriented Psych: Appropriate affect and cooperative There were no vitals taken for this visit.   Comprehensive Musculoskeletal Exam:    Pitting edema noted in bilateral lower extremities with small edema blisters noted of the anterior left ankle.  No significant calf tenderness or erythema.  Left knee ROM from 0 to 100 degrees.  Right knee ROM from 0 to 90 degrees.  No tenderness or palpable effusion within both knees.  Imaging:   Xray (right knee 3 views, left knee 3 views): Bilateral TKA prostheses with normal position and alignment  without evidence of loosening or damage.  Grossly unchanged from prior x-rays in 2023.   I personally reviewed and interpreted the radiographs.   Assessment:   78 y.o. female with history of bilateral TKA presenting today for bilateral knee swelling.  Upon exam she does have some significant bilateral pitting edema in her lower extremities.  Patient is on daily furosemide due to edema from heart failure.  Upon review of last note from cardiology 3 weeks  ago, no edema was present at that time so I have recommended close follow-up/consultation with cardiologist office for further instructions on treatment plan.  Would also like her to follow-up with Dr. Diamantina Providence office soon for recheck on both TKAs.  No concern for presence of infection at this time.  Plan :    -Follow-up with cardiology concerning bilateral lower extremity edema -Recommend follow-up with Dr. Mahalia Longest     I personally saw and evaluated the patient, and participated in the management and treatment plan.  Hazle Nordmann, PA-C Orthopedics

## 2023-09-13 NOTE — ED Notes (Signed)
 Patient is being discharged from the Urgent Care and sent to the Emergency Department via POV . Per Wynonia Lawman, NP, patient is in need of higher level of care due to pitting edema. Patient is aware and verbalizes understanding of plan of care.  Vitals:   09/13/23 1901  BP: 116/73  Pulse: 60  Resp: (!) 24  Temp: 98.5 F (36.9 C)  SpO2: 98%

## 2023-09-13 NOTE — ED Triage Notes (Signed)
 The pt was seen at [facility] earlier today for rt leg  swelling and draining for 1-2 weeks after she struck her rt knee against an object then

## 2023-09-13 NOTE — ED Triage Notes (Signed)
 Patient reports she noticed swelling, blisters, weeping lower legs for 1-2 weeks.  Lower legs are red, blisters present, extreme edema.  Patient denies having any medicines for these concerns

## 2023-09-13 NOTE — ED Provider Notes (Signed)
 Patient presents with 2-week history of progressively worsening lower leg swelling and redness.  Patient states that she now has weeping and blisters to both legs.  History of CHF.  Patient states that she takes 1.5 pills of 40 mg furosemide daily and was advised by cardiology to double this dose if she noticed swelling.  Patient states that she has been on and off doubling the dose over the last 2 weeks without relief of swelling.  Upon assessment patient has +4 nonpitting edema noted to bilateral legs.  There are also some open areas of her skin that are weeping fluid.  Upon further questioning patient also reports mildly increasing shortness of breath over the last 2 weeks.  Denies chest pain at this time.  Lungs clear bilaterally to auscultation at this time.  SpO2 98%.  Recommended that patient be seen in the ER for further evaluation and management due to severity of edema and new onset of shortness of breath.  Patient agreeable to plan at this time.  Patient is stable to arrive to the ER via POV.   Wynonia Lawman A, NP 09/13/23 2011

## 2023-09-13 NOTE — Telephone Encounter (Signed)
 error

## 2023-09-13 NOTE — Telephone Encounter (Signed)
 She has a standing prescription to double up her dose of furosemide for severe edema.  Has she tried that first?

## 2023-09-13 NOTE — Discharge Instructions (Addendum)
 As discussed my recommendation is that you go to the ER for further evaluation and treatment regarding the swelling in your legs.

## 2023-09-13 NOTE — Telephone Encounter (Addendum)
 Patient identification verified by 2 forms. Shade Flood, RN     Called and spoke to patient  Patient states:  - both legs has fluid coming out of it and the skin is tight. - experiencing fatigue  - legs red bilaterally  - painful to walk - legs itchy with visible blisters bilaterally   Patient denies:  - dizziness, SOB, vision changes, headache, chest pain.              Interventions/Plan: - Patient referred to urgent care to be evaluated for possible cellulitis.    Reviewed ED warning signs/precautions  Patient agrees with plan, no questions at this time

## 2023-09-13 NOTE — ED Provider Triage Note (Signed)
 Emergency Medicine Provider Triage Evaluation Note  Julia Mcfarland , a 78 y.o. female  was evaluated in triage.  Pt complains of SOB, leg swelling.  Review of Systems  Positive:  Negative:   Physical Exam  BP 107/65 (BP Location: Left Arm)   Pulse (!) 112   Resp (!) 21   Ht 5\' 2"  (1.575 m)   Wt 108 kg   SpO2 100%   BMI 43.55 kg/m  Gen:   Awake, no distress   Resp:  Normal effort  MSK:   Moves extremities without difficulty  Other:    Medical Decision Making  Medically screening exam initiated at 8:56 PM.  Appropriate orders placed.  Julia Mcfarland was informed that the remainder of the evaluation will be completed by another provider, this initial triage assessment does not replace that evaluation, and the importance of remaining in the ED until their evaluation is complete.  Worsening BL leg swelling and SOB x1 week. No chest pain, fever, nausea, vomiting, diarrhea.   Dorthy Cooler, [place] 09/13/23 2057

## 2023-09-14 ENCOUNTER — Inpatient Hospital Stay (HOSPITAL_COMMUNITY)

## 2023-09-14 ENCOUNTER — Other Ambulatory Visit: Payer: Self-pay

## 2023-09-14 ENCOUNTER — Encounter (HOSPITAL_COMMUNITY): Payer: Self-pay | Admitting: Internal Medicine

## 2023-09-14 DIAGNOSIS — I5031 Acute diastolic (congestive) heart failure: Secondary | ICD-10-CM | POA: Diagnosis not present

## 2023-09-14 DIAGNOSIS — X58XXXA Exposure to other specified factors, initial encounter: Secondary | ICD-10-CM | POA: Diagnosis present

## 2023-09-14 DIAGNOSIS — S80822A Blister (nonthermal), left lower leg, initial encounter: Secondary | ICD-10-CM | POA: Diagnosis present

## 2023-09-14 DIAGNOSIS — Z8616 Personal history of COVID-19: Secondary | ICD-10-CM | POA: Diagnosis not present

## 2023-09-14 DIAGNOSIS — I071 Rheumatic tricuspid insufficiency: Secondary | ICD-10-CM | POA: Insufficient documentation

## 2023-09-14 DIAGNOSIS — Z7984 Long term (current) use of oral hypoglycemic drugs: Secondary | ICD-10-CM | POA: Diagnosis not present

## 2023-09-14 DIAGNOSIS — I272 Pulmonary hypertension, unspecified: Secondary | ICD-10-CM | POA: Insufficient documentation

## 2023-09-14 DIAGNOSIS — I081 Rheumatic disorders of both mitral and tricuspid valves: Secondary | ICD-10-CM | POA: Diagnosis present

## 2023-09-14 DIAGNOSIS — Z79899 Other long term (current) drug therapy: Secondary | ICD-10-CM | POA: Diagnosis not present

## 2023-09-14 DIAGNOSIS — I5033 Acute on chronic diastolic (congestive) heart failure: Secondary | ICD-10-CM | POA: Insufficient documentation

## 2023-09-14 DIAGNOSIS — N1831 Chronic kidney disease, stage 3a: Secondary | ICD-10-CM | POA: Diagnosis present

## 2023-09-14 DIAGNOSIS — I340 Nonrheumatic mitral (valve) insufficiency: Secondary | ICD-10-CM | POA: Insufficient documentation

## 2023-09-14 DIAGNOSIS — E876 Hypokalemia: Secondary | ICD-10-CM | POA: Diagnosis not present

## 2023-09-14 DIAGNOSIS — S80821A Blister (nonthermal), right lower leg, initial encounter: Secondary | ICD-10-CM | POA: Diagnosis present

## 2023-09-14 DIAGNOSIS — I4891 Unspecified atrial fibrillation: Secondary | ICD-10-CM | POA: Diagnosis not present

## 2023-09-14 DIAGNOSIS — Z7902 Long term (current) use of antithrombotics/antiplatelets: Secondary | ICD-10-CM | POA: Diagnosis not present

## 2023-09-14 DIAGNOSIS — I480 Paroxysmal atrial fibrillation: Secondary | ICD-10-CM | POA: Diagnosis not present

## 2023-09-14 DIAGNOSIS — R600 Localized edema: Secondary | ICD-10-CM | POA: Diagnosis present

## 2023-09-14 DIAGNOSIS — Z8249 Family history of ischemic heart disease and other diseases of the circulatory system: Secondary | ICD-10-CM | POA: Diagnosis not present

## 2023-09-14 DIAGNOSIS — E66813 Obesity, class 3: Secondary | ICD-10-CM | POA: Diagnosis present

## 2023-09-14 DIAGNOSIS — E039 Hypothyroidism, unspecified: Secondary | ICD-10-CM | POA: Diagnosis present

## 2023-09-14 DIAGNOSIS — I5030 Unspecified diastolic (congestive) heart failure: Secondary | ICD-10-CM | POA: Diagnosis not present

## 2023-09-14 DIAGNOSIS — Z6841 Body Mass Index (BMI) 40.0 and over, adult: Secondary | ICD-10-CM | POA: Diagnosis not present

## 2023-09-14 DIAGNOSIS — I509 Heart failure, unspecified: Secondary | ICD-10-CM

## 2023-09-14 DIAGNOSIS — N179 Acute kidney failure, unspecified: Secondary | ICD-10-CM | POA: Diagnosis present

## 2023-09-14 DIAGNOSIS — I130 Hypertensive heart and chronic kidney disease with heart failure and stage 1 through stage 4 chronic kidney disease, or unspecified chronic kidney disease: Secondary | ICD-10-CM | POA: Diagnosis present

## 2023-09-14 DIAGNOSIS — I110 Hypertensive heart disease with heart failure: Secondary | ICD-10-CM | POA: Diagnosis present

## 2023-09-14 DIAGNOSIS — I4819 Other persistent atrial fibrillation: Secondary | ICD-10-CM | POA: Diagnosis present

## 2023-09-14 DIAGNOSIS — M7989 Other specified soft tissue disorders: Secondary | ICD-10-CM | POA: Diagnosis present

## 2023-09-14 DIAGNOSIS — Z833 Family history of diabetes mellitus: Secondary | ICD-10-CM | POA: Diagnosis not present

## 2023-09-14 DIAGNOSIS — I2510 Atherosclerotic heart disease of native coronary artery without angina pectoris: Secondary | ICD-10-CM | POA: Diagnosis present

## 2023-09-14 DIAGNOSIS — Z96653 Presence of artificial knee joint, bilateral: Secondary | ICD-10-CM | POA: Diagnosis present

## 2023-09-14 DIAGNOSIS — I872 Venous insufficiency (chronic) (peripheral): Secondary | ICD-10-CM | POA: Diagnosis present

## 2023-09-14 DIAGNOSIS — E0590 Thyrotoxicosis, unspecified without thyrotoxic crisis or storm: Secondary | ICD-10-CM | POA: Diagnosis present

## 2023-09-14 LAB — CBC WITH DIFFERENTIAL/PLATELET
Abs Immature Granulocytes: 0.01 10*3/uL (ref 0.00–0.07)
Basophils Absolute: 0.1 10*3/uL (ref 0.0–0.1)
Basophils Relative: 1 %
Eosinophils Absolute: 0.1 10*3/uL (ref 0.0–0.5)
Eosinophils Relative: 3 %
HCT: 39.5 % (ref 36.0–46.0)
Hemoglobin: 12.8 g/dL (ref 12.0–15.0)
Immature Granulocytes: 0 %
Lymphocytes Relative: 19 %
Lymphs Abs: 0.9 10*3/uL (ref 0.7–4.0)
MCH: 31.1 pg (ref 26.0–34.0)
MCHC: 32.4 g/dL (ref 30.0–36.0)
MCV: 96.1 fL (ref 80.0–100.0)
Monocytes Absolute: 0.9 10*3/uL (ref 0.1–1.0)
Monocytes Relative: 19 %
Neutro Abs: 2.8 10*3/uL (ref 1.7–7.7)
Neutrophils Relative %: 58 %
Platelets: 168 10*3/uL (ref 150–400)
RBC: 4.11 MIL/uL (ref 3.87–5.11)
RDW: 14.8 % (ref 11.5–15.5)
WBC: 4.7 10*3/uL (ref 4.0–10.5)
nRBC: 0 % (ref 0.0–0.2)

## 2023-09-14 LAB — ECHOCARDIOGRAM COMPLETE
AR max vel: 2.92 cm2
AV Peak grad: 13.7 mmHg
Ao pk vel: 1.85 m/s
Area-P 1/2: 5.51 cm2
Height: 62 in
MV M vel: 4.94 m/s
MV Peak grad: 97.6 mmHg
MV VTI: 3.84 cm2
S' Lateral: 3.1 cm
Weight: 3809.55 [oz_av]

## 2023-09-14 LAB — COMPREHENSIVE METABOLIC PANEL
ALT: 21 U/L (ref 0–44)
AST: 43 U/L — ABNORMAL HIGH (ref 15–41)
Albumin: 3 g/dL — ABNORMAL LOW (ref 3.5–5.0)
Alkaline Phosphatase: 97 U/L (ref 38–126)
Anion gap: 15 (ref 5–15)
BUN: 16 mg/dL (ref 8–23)
CO2: 20 mmol/L — ABNORMAL LOW (ref 22–32)
Calcium: 8.6 mg/dL — ABNORMAL LOW (ref 8.9–10.3)
Chloride: 106 mmol/L (ref 98–111)
Creatinine, Ser: 1.02 mg/dL — ABNORMAL HIGH (ref 0.44–1.00)
GFR, Estimated: 57 mL/min — ABNORMAL LOW (ref >60)
Glucose, Bld: 213 mg/dL — ABNORMAL HIGH (ref 70–99)
Potassium: 4.3 mmol/L (ref 3.5–5.1)
Sodium: 141 mmol/L (ref 135–145)
Total Bilirubin: 1.9 mg/dL — ABNORMAL HIGH (ref 0.0–1.2)
Total Protein: 6.2 g/dL — ABNORMAL LOW (ref 6.5–8.1)

## 2023-09-14 LAB — TSH: TSH: <0.01 u[IU]/mL — ABNORMAL LOW (ref 0.350–4.500)

## 2023-09-14 LAB — T4, FREE: Free T4: 3.26 ng/dL — ABNORMAL HIGH (ref 0.61–1.12)

## 2023-09-14 LAB — MAGNESIUM: Magnesium: 2.1 mg/dL (ref 1.7–2.4)

## 2023-09-14 MED ORDER — METOPROLOL SUCCINATE ER 50 MG PO TB24
50.0000 mg | ORAL_TABLET | Freq: Every day | ORAL | Status: DC
  Administered 2023-09-14: 50 mg via ORAL
  Filled 2023-09-14: qty 1

## 2023-09-14 MED ORDER — METOPROLOL SUCCINATE ER 25 MG PO TB24
50.0000 mg | ORAL_TABLET | Freq: Every day | ORAL | Status: DC
  Filled 2023-09-14: qty 2

## 2023-09-14 MED ORDER — METOPROLOL SUCCINATE ER 25 MG PO TB24
50.0000 mg | ORAL_TABLET | Freq: Every day | ORAL | Status: DC
  Administered 2023-09-14: 50 mg via ORAL

## 2023-09-14 MED ORDER — POTASSIUM CHLORIDE 20 MEQ PO PACK
20.0000 meq | PACK | Freq: Once | ORAL | Status: AC
  Administered 2023-09-14: 20 meq via ORAL
  Filled 2023-09-14: qty 1

## 2023-09-14 MED ORDER — METOPROLOL SUCCINATE ER 25 MG PO TB24
50.0000 mg | ORAL_TABLET | Freq: Once | ORAL | Status: AC
  Administered 2023-09-14: 50 mg via ORAL
  Filled 2023-09-14: qty 2

## 2023-09-14 MED ORDER — EMPAGLIFLOZIN 10 MG PO TABS
10.0000 mg | ORAL_TABLET | Freq: Every day | ORAL | Status: DC
  Administered 2023-09-14 – 2023-09-23 (×10): 10 mg via ORAL
  Filled 2023-09-14 (×10): qty 1

## 2023-09-14 MED ORDER — FUROSEMIDE 10 MG/ML IJ SOLN
40.0000 mg | Freq: Two times a day (BID) | INTRAMUSCULAR | Status: DC
  Administered 2023-09-14 (×2): 40 mg via INTRAVENOUS
  Filled 2023-09-14 (×3): qty 4

## 2023-09-14 MED ORDER — FUROSEMIDE 10 MG/ML IJ SOLN
40.0000 mg | Freq: Once | INTRAMUSCULAR | Status: AC
Start: 2023-09-14 — End: 2023-09-14
  Administered 2023-09-14: 40 mg via INTRAVENOUS
  Filled 2023-09-14: qty 4

## 2023-09-14 MED ORDER — DABIGATRAN ETEXILATE MESYLATE 150 MG PO CAPS
150.0000 mg | ORAL_CAPSULE | Freq: Two times a day (BID) | ORAL | Status: DC
  Administered 2023-09-14 – 2023-09-23 (×19): 150 mg via ORAL
  Filled 2023-09-14 (×21): qty 1

## 2023-09-14 MED ORDER — METOPROLOL SUCCINATE ER 100 MG PO TB24: 100.0000 mg | ORAL_TABLET | Freq: Every day | ORAL | Status: DC

## 2023-09-14 MED ORDER — POTASSIUM CHLORIDE CRYS ER 20 MEQ PO TBCR
20.0000 meq | EXTENDED_RELEASE_TABLET | Freq: Every day | ORAL | Status: DC
  Administered 2023-09-14: 20 meq via ORAL
  Filled 2023-09-14: qty 1

## 2023-09-14 NOTE — Plan of Care (Signed)
  Problem: Education: Goal: Ability to demonstrate management of disease process will improve Outcome: Progressing Goal: Ability to verbalize understanding of medication therapies will improve Outcome: Progressing   Problem: Activity: Goal: Capacity to carry out activities will improve Outcome: Progressing   Problem: Cardiac: Goal: Ability to achieve and maintain adequate cardiopulmonary perfusion will improve Outcome: Progressing

## 2023-09-14 NOTE — ED Provider Notes (Signed)
  EMERGENCY DEPARTMENT AT [facility] Provider Note   CSN: [id number] Arrival date & time: 09/13/23  2038     History  Chief Complaint  Patient presents with   Leg Swelling    Julia Mcfarland is a 78 y.o. female history of A-fib on dabigatran, CHF, CKD stage III, chronic venous insufficiency presented for bilateral leg swelling for the past few weeks.  Patient states she has had some chest pain shortness of breath when she exerts herself and states this has been in relation to the leg swelling.  Patient states he feels fluid overloaded.  Patient takes 1-1/2 pills of her Lasix and is told that she can double it if she begins feeling fluid overloaded.  Patient states she has been taking her medications as prescribed.  Patient denies any fevers, abdominal pain, productive cough.  Patient states she has fluid coming out of both of her legs as both her legs appear swollen.  Cardiologist: Croitoru, MD  Last echo: LVEF 60-65% and severe biatrial dilation, moderate to severe MR and severe TR   Home Medications Prior to Admission medications   Medication Sig Start Date End Date Taking? Authorizing Provider  albuterol (VENTOLIN HFA) 108 (90 Base) MCG/ACT inhaler INHALE 2 PUFFS INTO LUNGS EVERY 6 HOURS AS NEEDED FOR WHEEZING OR SHORTNESS OF BREATH Patient not taking: Reported on 08/30/2023 11/28/20   Eulis Foster, FNP  Ascorbic Acid (VITAMIN C) 1000 MG tablet Take 1,000 mg by mouth daily.    [provider]  cetirizine (ZYRTEC) 10 MG tablet Take 10 mg by mouth daily. 05/10/23   [provider]  Cholecalciferol (VITAMIN D3) 5000 UNITS CAPS Take 5,000 Units by mouth daily.     [provider]  dabigatran (PRADAXA) 150 MG CAPS capsule Take 150 mg by mouth 2 (two) times daily. 08/24/23   [provider]  empagliflozin (JARDIANCE) 10 MG TABS tablet Take 1 tablet (10 mg total) by mouth daily before breakfast. 04/10/23   Croitoru, Rachelle Hora, MD  furosemide  (LASIX) 40 MG tablet Take 1.5 tablets once daily and may take extra 1.5 tablets as needed for swelling 08/24/23   Croitoru, Mihai, MD  hydroxypropyl methylcellulose / hypromellose (ISOPTO TEARS / GONIOVISC) 2.5 % ophthalmic solution Place 1 drop into both eyes daily as needed for dry eyes.    [provider]  metoprolol succinate (TOPROL XL) 50 MG 24 hr tablet Take 2 tablets (100 mg total) by mouth every morning AND 1 tablet (50 mg total) every evening. Take with or immediately following a meal.. 08/30/23   Croitoru, Mihai, MD  polyethylene glycol (MIRALAX / GLYCOLAX) 17 g packet Take 17 g by mouth daily as needed for mild constipation. Patient not taking: Reported on 08/30/2023    [provider]  potassium chloride SA (KLOR-CON M) 20 MEQ tablet Take 1 tablet (20 mEq total) by mouth daily. 08/24/22   Robbie Lis M, PA-C  vitamin B-12 (CYANOCOBALAMIN) 500 MCG tablet Take 500 mcg by mouth daily.    [provider]      Allergies    Patient has no known allergies.    Review of Systems   Review of Systems  Physical Exam Updated Vital Signs BP 113/86   Pulse (!) 114   Temp 98.1 F (36.7 C) (Oral)   Resp (!) 21   Ht 5\' 2"  (1.575 m)   Wt 108 kg   SpO2 99%   BMI 43.55 kg/m  Physical Exam Vitals reviewed.  Constitutional:      General: She is not in acute distress. HENT:     Head: Normocephalic and atraumatic.  Eyes:     Extraocular Movements: Extraocular movements intact.     Conjunctiva/sclera: Conjunctivae normal.     Pupils: Pupils are equal, round, and reactive to light.  Cardiovascular:     Rate and Rhythm: Tachycardia present. Rhythm irregular.     Pulses: Normal pulses.     Heart sounds: Normal heart sounds.     Comments: 2+ bilateral radial/dorsalis pedis pulses with irregular rate Pulmonary:     Effort: Pulmonary effort is normal. No respiratory distress.     Breath sounds: Normal breath sounds.  Abdominal:     Palpations: Abdomen is soft.      Tenderness: There is no abdominal tenderness. There is no guarding or rebound.  Musculoskeletal:        General: Normal range of motion.     Cervical back: Normal range of motion and neck supple.     Comments: 5 out of 5 bilateral grip/leg extension strength 2+ bilateral pitting edema  Skin:    General: Skin is warm and dry.     Capillary Refill: Capillary refill takes less than 2 seconds.  Neurological:     General: No focal deficit present.     Mental Status: She is alert and oriented to person, place, and time.     Comments: Sensation intact in all 4 limbs  Psychiatric:        Mood and Affect: Mood normal.     ED Results / Procedures / Treatments   Labs (all labs ordered are listed, but only abnormal results are displayed) Labs Reviewed  BASIC METABOLIC PANEL - Abnormal; Notable for the following components:      Result Value   Glucose, Bld 111 (*)    All other components within normal limits  BRAIN NATRIURETIC PEPTIDE - Abnormal; Notable for the following components:   B Natriuretic Peptide 359.3 (*)    All other components within normal limits  MAGNESIUM  CBC  TROPONIN I (HIGH SENSITIVITY)    EKG None  Radiology DG Knee Complete 4 Views Right Result Date: 09/13/2023 CLINICAL DATA:  pain EXAM: RIGHT KNEE - COMPLETE 4+ VIEW COMPARISON:  None available FINDINGS: Total right knee arthroplasty. No radiographic surgical hardware complication. No evidence of fracture, dislocation, or joint effusion. No evidence of severe arthropathy or other focal bone abnormality. Diffuse subcutaneus soft tissue edema. IMPRESSION: 1. No acute displaced fracture or dislocation. 2. Total right knee arthroplasty. Electronically Signed   By: Tish Frederickson M.D.   On: 09/13/2023 22:56   DG Knee Complete 4 Views Left Result Date: 09/13/2023 CLINICAL DATA:  pain EXAM: LEFT KNEE - COMPLETE 4+ VIEW COMPARISON:  X-ray left knee dated 07/30/2021 FINDINGS: Total left knee arthroplasty. No  radiographic findings to suggest surgical hardware complication. No evidence of fracture, dislocation, or joint effusion. No evidence of arthropathy or other focal bone abnormality. Diffuse subcutaneus soft tissue edema. IMPRESSION: 1. No acute displaced fracture or dislocation. 2. Total left knee arthroplasty. Electronically Signed   By: Tish Frederickson M.D.   On: 09/13/2023 22:54   DG Chest 2 View Result Date: 09/13/2023 CLINICAL DATA:  sob EXAM: CHEST - 2 VIEW COMPARISON:  Chest x-ray 08/03/2022, CT chest 03/22/2021 FINDINGS: Persistent enlarged cardiac silhouette. The heart and mediastinal contours are unchanged. Atherosclerotic plaque. No focal consolidation. No pulmonary edema. No pleural effusion. No pneumothorax. No acute osseous abnormality. IMPRESSION: 1. No  active cardiopulmonary disease. 2.  Aortic Atherosclerosis (ICD10-I70.0). Electronically Signed   By: Tish Frederickson M.D.   On: 09/13/2023 22:52    Procedures .Critical Care  Performed by: Netta Corrigan, PA-C Authorized by: Netta Corrigan, PA-C   Critical care provider statement:    Critical care time (minutes):  30   Critical care time was exclusive of:  Separately billable procedures and treating other patients   Critical care was necessary to treat or prevent imminent or life-threatening deterioration of the following conditions:  Cardiac failure   Critical care was time spent personally by me on the following activities:  Blood draw for specimens, development of treatment plan with patient or surrogate, discussions with consultants, evaluation of patient's response to treatment, examination of patient, obtaining history from patient or surrogate, review of old charts, re-evaluation of patient's condition, pulse oximetry, ordering and review of radiographic studies, ordering and review of laboratory studies and ordering and performing treatments and interventions   I assumed direction of critical care for this patient from  another provider in my specialty: no     Care discussed with: admitting provider       Medications Ordered in ED Medications  metoprolol succinate (TOPROL-XL) 24 hr tablet 50 mg (has no administration in time range)  furosemide (LASIX) injection 40 mg (40 mg Intravenous Given 09/14/23 0042)    ED Course/ Medical Decision Making/ A&P                                 Medical Decision Making Risk Prescription drug management. Decision regarding hospitalization.   MASSIAH LONGANECKER 78 y.o. presented today for bilateral leg edema. Working DDx that I considered at this time includes, but not limited to, dependent edema, venous insufficiency, thrombophlebitis, secondary to medications, CHF, edema, AKI, nephrotic syndrome.  R/o DDx: dependent edema, venous insufficiency, thrombophlebitis, secondary to medications, edema, AKI, nephrotic syndrome: These are considered less likely due to history of present illness, physical exam, labs/imaging findings.  Review of prior external notes: 08/30/2023 office visit  Unique Tests and My Independent Interpretation:  CBC: Unremarkable BMP: Unremarkable BNP: 359.3 Magnesium: Unremarkable Troponin: 5 EKG: A-fib Chest x-ray: No acute pathology  Social Determinants of Health: none  Discussion with Independent Historian: None  Discussion of Management of Tests:  Toniann Fail, MD Hospitalist  Risk: High: hospitalization or escalation of hospital-level care  Risk Stratification Score: none  Staffed with Long, MD    EMERGENCY DEPARTMENT [place] CARDIAC EXAM "Study: Limited Ultrasound of the Heart and Pericardium"  INDICATIONS:Dyspnea Multiple views of the heart and pericardium were obtained in real-time with a multi-frequency probe.  PERFORMED GE:XBMWUX IMAGES ARCHIVED?: Yes LIMITATIONS:  Body habitus VIEWS USED: Subcostal 4 chamber, Parasternal long axis, Parasternal short axis, and Inferior Vena Cava INTERPRETATION: Cardiac activity present,  Pericardial effusioin absent, Cardiac tamponade absent, Normal contractility, and IVC dilated   Plan: On exam patient was in no acute distress mildly tachycardic on exam. The cardiac monitor was ordered secondary to the patient's history of bilateral leg edema to monitor the patient for dysrhythmia. Cardiac monitor by my independent interpretation showed A-fib.  Bedside ultrasound shows plethoric IVC and with bilateral leg swelling do feel patient is fluid overloaded.  Patient is prescribed 40 mg of Lasix and so we will order some IV Lasix here.  Will ambulate patient to see if she becomes hypoxic.  BMP from triage shows elevated levels when compared to patient's  baseline indicative of CHF.  Patient's labs and imaging appear reassuring outside the BNP.  Patient was given Lasix however started to go into A-fib with RVR at a rate of 121 when I went to go reevaluate her.  I discussed with the patient we agreed the patient will need be admitted given the A-fib RVR along with fluid overload as she most likely needs to be diuresed.  Will consult hospitalist.  I spoke to the hospitalist and patient was accepted for admission.  Hospitalist request that we give patient's oral metoprolol here which was ordered.  This chart was dictated using voice recognition software.  Despite best efforts to proofread,  errors can occur which can change the documentation meaning.         Final Clinical Impression(s) / ED Diagnoses Final diagnoses:  Leg edema  Acute on chronic congestive heart failure, unspecified heart failure type (HCC)  Atrial fibrillation with RVR [facility])    Rx / DC Orders ED Discharge Orders     None         Remi Deter 09/14/23 0323    Maia Plan, MD 09/14/23 [phone]

## 2023-09-14 NOTE — ED Notes (Signed)
 Pt able to safely ambulate with personal cane, SPO2 saturations remained 98% to 100% while ambulating. Pt denies shortness of breath while ambulating at this time.

## 2023-09-14 NOTE — Telephone Encounter (Signed)
 She can take 120 mg of furosemide daily for up to 3 days for severe edema, to call us if that does not work. On days that she does take this double dose of furosemide she should also double her dose of potassium chloride.

## 2023-09-14 NOTE — TOC Initial Note (Signed)
 Transition of Care [facility]) - Initial/Assessment Note    Patient Details  Name: Julia Mcfarland MRN: [id number] Date of Birth: 08-11-1945  Transition of Care [facility]) CM/SW Contact:    Lucretia Field, LCSW Phone Number: 09/14/2023, 2:18 PM  Clinical Narrative:                 Patient lives in her home with daughter and grandkids. Patient has a 1 level house, she is independent with ADL's, she drives at times and has positive support. Patient uses a shower chair, cane, walker, and lost her husband during covid. Patient has hx of Afib, CHF/HCC, SOB and lower extremity edema.          Patient Goals and CMS Choice            Expected Discharge Plan and Services                                              Prior Living Arrangements/Services                       Activities of Daily Living   ADL Screening (condition at time of admission) Independently performs ADLs?: Yes (appropriate for developmental age) Is the patient deaf or have difficulty hearing?: No Does the patient have difficulty seeing, even when wearing glasses/contacts?: Yes Does the patient have difficulty concentrating, remembering, or making decisions?: No  Permission Sought/Granted                  Emotional Assessment Patient was calm and able to answer questions without complications.          Admission diagnosis:  Acute CHF (congestive heart failure) (HCC) [I50.9] Patient Active Problem List   Diagnosis Date Noted   Acute on chronic heart failure with preserved ejection fraction (HFpEF) (HCC) 09/14/2023   Severe tricuspid valve regurgitation 09/14/2023   Moderate mitral valve regurgitation 09/14/2023   Pulmonary hypertension (HCC) 09/14/2023   Atherosclerosis of aorta (HCC) 02/01/2023   Coronary artery calcification 02/01/2023   Persistent atrial fibrillation with RVR (HCC) 08/03/2022   Chronic diastolic heart failure (HCC) 08/03/2022   Persistent atrial fibrillation (HCC)  10/28/2019   Chronic venous insufficiency 02/18/2019   Prediabetes 08/21/2014   Osteoporosis 08/14/2013   Morbid obesity (HCC) 08/14/2013   PCP:  Hillery Aldo, NP Pharmacy:   [facility] - [place]), [place] - [address] [place] ([place]) [place] [address] Phone: [phone] Fax: [phone]     Social Drivers of Health (SDOH) Social History: SDOH Screenings   Food Insecurity: No Food Insecurity (09/14/2023)  Housing: High Risk (09/14/2023)  Transportation Needs: No Transportation Needs (09/14/2023)  Utilities: Not At Risk (09/14/2023)  Alcohol Screen: Low Risk  (08/05/2022)  Depression (PHQ2-9): Low Risk  (03/19/2020)  Financial Resource Strain: Low Risk  (08/05/2022)  Social Connections: Moderately Integrated (09/14/2023)  Tobacco Use: Low Risk  (09/14/2023)   SDOH Interventions:     Readmission Risk Interventions     No data to display         Lily Peer, MSW, LCSWA Transition of Care  Clinical Social Worker (ED 3-11 Mon-Fri)  [phone]

## 2023-09-14 NOTE — Consult Note (Signed)
 Cardiology Consultation   Patient ID: Julia Mcfarland MRN: [id number]; DOB: 1946-05-05  Admit date: 09/13/2023 Date of Consult: 09/14/2023  PCP:  Julia Aldo, NP   [facility] Providers Cardiologist:  Thurmon Fair, MD      Patient Profile:   Julia Mcfarland is a 78 y.o. female with a hx of persistent atrial fibrillation, moderate-severe MR, severe TR, diastolic heart failure, obesity, prediabetes, coronary calcifications on CT, CKD stage IIIa who is being seen 09/14/2023 for the evaluation of CHF, atrial fibrillation at the request of Dr. Alanda Slim.  History of Present Illness:   [name] is a 78 year old female with above medical history who has been followed by Dr. Royann Shivers.  Per chart review, patient was first diagnosed with atrial fibrillation in 2021 while she was hospitalized with COVID-19 pneumonia.  Echocardiogram on 10/29/2019 showed EF 55-60%, no regional wall motion abnormalities, normal RV systolic function.  Later, cardiac monitor in 02/2020 showed episodes of paroxysmal atrial fibrillation.  She remained on anticoagulation.  Echocardiogram in 03/2020 showed EF 60-65%, no wall motion abnormalities, mild LVH and grade 1 DD, normal RV function, trivial MR.  Patient was lost to follow-up.  She was admitted again in 07/2022 with CHF and A-fib with RVR.  Patient was started on digoxin and metoprolol.  Echocardiogram 08/04/2022 showed EF 60-65%, no wall motion abnormalities, normal RV function, severe biatrial enlargement, moderate-severe mitral valve regurgitation, severe tricuspid valve regurgitation.  There was some concern for restrictive physiology on echo.  Underwent myocardial amyloid imaging on 08/24/2022 that was an equivocal study for TTR amyloid.  Her digoxin was stopped in 10/2022 due to high serum levels and concern for amyloidosis.  Was also transition from Eliquis to Pradaxa due to cost. Later underwent cardiac MRI on 11/16/2022 that showed severe biatrial enlargement  with no left atrial appendage thrombus, normal LV size and function.  No evidence of amyloid.  Moderate appearing functional MR.    Patient was last seen by Dr. Royann Shivers on 08/30/23.  At that time, patient was overall doing well.  Did become dyspneic on occasion if she became emotionally upset.  Otherwise can do household chores without difficulty.  Denied palpitations, chest pain, dizziness.  At that appointment, her heart rate was a bit elevated.  Metoprolol succinate was increased to a total of 150 mg daily.  Losartan was stopped her event hypotension.  Noted that with her severe biatrial dilation and valvular abnormalities, it was unlikely she would maintain normal sinus rhythm.  Dr. Teodoro Spray recommended managing his permanent atrial fibrillation with rate control.  She remained on appropriate anticoagulation.  Patient presented to the ED on 3/12 complaining of swelling, blisters, and weeping on her lower legs for the past 1-2 weeks.  She had initially presented to an urgent care, where she had 4+ nonpitting edema in bilateral legs.  There were also some open areas of her skin that were weeping fluid on exam.  She was sent to the ED for further evaluation.  In the ED, initial vital signs showed heart rate 112 bpm, BP 107/65, oxygen 100% on room air.  BMP and CBC grossly normal.  BNP 359.  High-sensitivity troponin 5. TSH undetectable. Chest x-ray showed no active cardiopulmonary disease.  Initial EKG in the ED showed atrial fibrillation with RVR, heart rate 115 bpm.  Patient was admitted to the internal medicine service for treatment of acute on chronic CHF, atrial fibrillation with RVR.  She was started on IV Lasix 40 mg IV twice  daily.  Remained on Jardiance, metoprolol, Pradaxa. Cardiology consulted   On interview, patient reports that after she had seen Dr. Salena Saner on 2/26, she scraped her leg getting out of the car.  Her leg blood from the wound, and she bandaged it up so that did not infected.  Since she was  being attention to her leg wound, she paid more attention to her legs than usual.  She is aware of increased lower extremity swelling that has been getting worse over time.  She developed blisters and has had some fluid leak out of her skin. Swelling is most noticeable in her ankles and calves, but she does feel a bit swollen in her thighs as well. Also has been a bit short of breath on exertion and has had low energy overall. Denies cough, orthopnea, fever, chills, body aches. Denies chest pain, palpitations, dizziness. She is always in atrial fibrillation and she does not really feel this. When she was seen by Dr. Salena Saner on 2/26, she weighed 238 lbs. Interestingly, weight today is 238 lbs.   Past Medical History:  Diagnosis Date   Allergy    Arthritis    Chicken pox    Chronic venous insufficiency    a. Uses furosemide prn.   Degenerative joint disease    Morbid obesity (HCC)    Osteoarthritis    PONV (postoperative nausea and vomiting)     Past Surgical History:  Procedure Laterality Date   ABDOMINAL HYSTERECTOMY  1988   CARPAL TUNNEL RELEASE Right    COLONOSCOPY WITH PROPOFOL N/A 09/01/2020   Procedure: COLONOSCOPY WITH PROPOFOL;  Surgeon: Vida Rigger, MD;  Location: [facility];  Service: Endoscopy;  Laterality: N/A;   POLYPECTOMY  09/01/2020   Procedure: POLYPECTOMY;  Surgeon: Vida Rigger, MD;  Location: [facility];  Service: Endoscopy;;   REPLACEMENT TOTAL KNEE Right 2010   TOTAL KNEE ARTHROPLASTY Left 03/07/2017   Procedure: LEFT TOTAL KNEE ARTHROPLASTY;  Surgeon: Cammy Copa, MD;  Location: [facility] OR;  Service: Orthopedics;  Laterality: Left;     Inpatient Medications: Scheduled Meds:  dabigatran  150 mg Oral BID   empagliflozin  10 mg Oral QAC breakfast   furosemide  40 mg Intravenous Q12H   [START ON 09/15/2023] metoprolol succinate  100 mg Oral Daily   And   metoprolol succinate  50 mg Oral QHS   potassium chloride SA  20 mEq Oral Daily   Continuous Infusions:  PRN  Meds:   Allergies:   No Known Allergies  Social History:   Social History   Socioeconomic History   Marital status: Married    Spouse name: Not on file   Number of children: Not on file   Years of education: Not on file   Highest education level: Not on file  Occupational History   Not on file  Tobacco Use   Smoking status: Never   Smokeless tobacco: Never  Vaping Use   Vaping status: Never Used  Substance and Sexual Activity   Alcohol use: Yes    Comment: occasional   Drug use: No   Sexual activity: Never  Other Topics Concern   Not on file  Social History Narrative   Lives in [place] w/ husband.  Says she's relatively active but does not routinely exercise.   Social Drivers of Corporate investment banker Strain: Low Risk  (08/05/2022)   Overall Financial Resource Strain (CARDIA)    Difficulty of Paying Living Expenses: Not very hard  Food Insecurity: No Food  Insecurity (08/03/2022)   Hunger Vital Sign    Worried About Running Out of Food in the Last Year: Never true    Ran Out of Food in the Last Year: Never true  Transportation Needs: No Transportation Needs (08/03/2022)   PRAPARE - Administrator, Civil Service (Medical): No    Lack of Transportation (Non-Medical): No  Physical Activity: Not on file  Stress: Not on file  Social Connections: Not on file  Intimate Partner Violence: Not At Risk (08/03/2022)   Humiliation, Afraid, Rape, and Kick questionnaire    Fear of Current or Ex-Partner: No    Emotionally Abused: No    Physically Abused: No    Sexually Abused: No    Family History:    Family History  Problem Relation Age of Onset   Arthritis Mother    Heart disease Mother    Hypertension Mother    Diabetes Mother    Stroke Father    Hypertension Father    Stroke Sister    Hypertension Sister    Heart disease Maternal Grandfather    Diabetes Paternal Grandmother    Heart disease Paternal Grandfather    Breast cancer Neg Hx       ROS:  Please see the history of present illness.   All other ROS reviewed and negative.     Physical Exam/Data:   Vitals:   09/14/23 0741 09/14/23 0956 09/14/23 1051 09/14/23 1128  BP: 107/67 107/67 110/61   Pulse: (!) 108 (!) 105 (!) 105   Resp: 20  (!) 26   Temp: 98.2 F (36.8 C)   98.1 F (36.7 C)  TempSrc: Oral   Oral  SpO2: 99%  98%   Weight:      Height:       No intake or output data in the 24 hours ending 09/14/23 1133    09/13/2023    8:48 PM 08/30/2023    9:30 AM 02/01/2023    9:31 AM  Last 3 Weights  Weight (lbs) 238 lb 1.6 oz 238 lb 237 lb 3.2 oz  Weight (kg) 108 kg 107.956 kg 107.593 kg     Body mass index is 43.55 kg/m.  General:  Well nourished, well developed, in no acute distress. Sitting upright in the bed.  HEENT: normal Neck: no JVD Vascular: Radial pulses 2+ bilaterally Cardiac:  normal S1, S2; irregular rate and rhythm. Faint systolic murmur  Lungs:  clear to auscultation bilaterally, no wheezing, rhonchi or rales. Normal WOB on room air  Abd: soft, nontender  Ext: 3+ edema in BLE, to the mid thighs  Musculoskeletal:  No deformities  Skin: warm and dry  Neuro:  CNs 2-12 intact, no focal abnormalities noted Psych:  Normal affect   EKG:  The EKG was personally reviewed and demonstrates: Initial EKG in the ED showed atrial fibrillation with RVR, heart rate 115 bpm.   Telemetry:  Telemetry was personally reviewed and demonstrates:  Atrial fibrillation, HR in the 100s-110s  Relevant CV Studies:  Echocardiogram 09/14/23  1. Left ventricular ejection fraction, by estimation, is 65 to 70%. The  left ventricle has normal function. The left ventricle has no regional  wall motion abnormalities. Left ventricular diastolic function could not  be evaluated.   2. Right ventricular systolic function is normal. The right ventricular  size is severely enlarged. There is moderately elevated pulmonary artery  systolic pressure. The estimated right  ventricular systolic pressure is  46.1 mmHg.   3. Left atrial  size was moderately dilated.   4. Right atrial size was severely dilated.   5. The mitral valve is normal in structure. Moderate mitral valve  regurgitation. No evidence of mitral stenosis.   6. 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   7. The aortic valve is normal in structure. Aortic valve regurgitation is  not visualized. No aortic stenosis is present.   8. The inferior vena cava is dilated in size with <50% respiratory  variability, suggesting right atrial pressure of 15 mmHg.    Laboratory Data:  High Sensitivity Troponin:   Recent Labs  Lab 09/13/23 2057  TROPONINIHS 5     Chemistry Recent Labs  Lab 09/13/23 2057  NA 142  K 4.0  CL 105  CO2 26  GLUCOSE 111*  BUN 15  CREATININE 0.84  CALCIUM 9.0  MG 2.2  GFRNONAA >60  ANIONGAP 11    No results for input(s): "PROT", "ALBUMIN", "AST", "ALT", "ALKPHOS", "BILITOT" in the last 168 hours. Lipids No results for input(s): "CHOL", "TRIG", "HDL", "LABVLDL", "LDLCALC", "CHOLHDL" in the last 168 hours.  Hematology Recent Labs  Lab 09/13/23 2057  WBC 5.6  RBC 4.33  HGB 13.3  HCT 41.9  MCV 96.8  MCH 30.7  MCHC 31.7  RDW 14.6  PLT 194   Thyroid  Recent Labs  Lab 09/14/23 0655  TSH <0.010*    BNP Recent Labs  Lab 09/13/23 2057  BNP 359.3*    DDimer No results for input(s): "DDIMER" in the last 168 hours.   Radiology/Studies:  ECHOCARDIOGRAM COMPLETE Result Date: 09/14/2023    ECHOCARDIOGRAM REPORT   Patient Name:   HARLOWE DOWLER Date of Exam: 09/14/2023 Medical Rec #:  [id number]      Height:       62.0 in Accession #:    [id number]     Weight:       238.1 lb Date of Birth:  1946-04-03      BSA:          2.059 m Patient Age:    77 years       BP:           99/67 mmHg Patient Gender: F              HR:           123 bpm. Exam Location:  Inpatient Procedure: 2D Echo, Color Doppler and Cardiac Doppler (Both Spectral and Color            Flow Doppler  were utilized during procedure). Indications:     CHF  History:         Patient has prior history of Echocardiogram examinations. CHF;                  Arrythmias:Atrial Fibrillation.  Sonographer:     Lamont Snowball Referring Phys:  [id number] Eduard Clos Diagnosing Phys: Armanda Magic MD IMPRESSIONS  1. Left ventricular ejection fraction, by estimation, is 65 to 70%. The left ventricle has normal function. The left ventricle has no regional wall motion abnormalities. Left ventricular diastolic function could not be evaluated.  2. Right ventricular systolic function is normal. The right ventricular size is severely enlarged. There is moderately elevated pulmonary artery systolic pressure. The estimated right ventricular systolic pressure is 46.1 mmHg.  3. Left atrial size was moderately dilated.  4. Right atrial size was severely dilated.  5. The mitral valve is normal in structure. Moderate mitral valve regurgitation. No evidence of mitral stenosis.  6. 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  7. The aortic valve is normal in structure. Aortic valve regurgitation is not visualized. No aortic stenosis is present.  8. The inferior vena cava is dilated in size with <50% respiratory variability, suggesting right atrial pressure of 15 mmHg. FINDINGS  Left Ventricle: Left ventricular ejection fraction, by estimation, is 65 to 70%. The left ventricle has normal function. The left ventricle has no regional wall motion abnormalities. The left ventricular internal cavity size was normal in size. There is  no left ventricular hypertrophy. Left ventricular diastolic function could not be evaluated due to atrial fibrillation. Left ventricular diastolic function could not be evaluated. Right Ventricle: The right ventricular size is severely enlarged. No increase in right ventricular wall thickness. Right ventricular systolic function is normal. There is moderately elevated pulmonary artery systolic pressure. The tricuspid  regurgitant velocity is 2.79 m/s, and with an assumed right atrial pressure of 15 mmHg, the estimated right ventricular systolic pressure is 46.1 mmHg. Left Atrium: Left atrial size was moderately dilated. Right Atrium: Right atrial size was severely dilated. Pericardium: There is no evidence of pericardial effusion. Mitral Valve: The mitral valve is normal in structure. Mild mitral annular calcification. Moderate mitral valve regurgitation, with eccentric posteriorly directed jet. No evidence of mitral valve stenosis. MV peak gradient, 6.2 mmHg. The mean mitral valve gradient is 3.3 mmHg. Tricuspid Valve: The tricuspid valve is normal in structure. 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 No evidence of tricuspid stenosis. Aortic Valve: The aortic valve is normal in structure. Aortic valve regurgitation is not visualized. No aortic stenosis is present. Aortic valve peak gradient measures 13.7 mmHg. Pulmonic Valve: The pulmonic valve was normal in structure. Pulmonic valve regurgitation is not visualized. No evidence of pulmonic stenosis. Aorta: The aortic root is normal in size and structure. Venous: The inferior vena cava is dilated in size with less than 50% respiratory variability, suggesting right atrial pressure of 15 mmHg. IAS/Shunts: No atrial level shunt detected by color flow Doppler.  LEFT VENTRICLE PLAX 2D LVIDd:         4.00 cm LVIDs:         3.10 cm LV PW:         0.90 cm LV IVS:        0.70 cm LVOT diam:     2.10 cm LV SV:         67 LV SV Index:   33 LVOT Area:     3.46 cm  RIGHT VENTRICLE          IVC RV Basal diam:  5.60 cm  IVC diam: 2.90 cm LEFT ATRIUM             Index        RIGHT ATRIUM           Index LA Vol (A2C):   94.5 ml 45.90 ml/m  RA Area:     26.50 cm LA Vol (A4C):   76.5 ml 37.16 ml/m  RA Volume:   88.90 ml  43.18 ml/m LA Biplane Vol: 91.8 ml 44.59 ml/m  AORTIC VALVE AV Area (Vmax): 2.92 cm AV Vmax:        185.33 cm/s AV Peak Grad:   13.7 mmHg LVOT Vmax:      156.25 cm/s LVOT  Vmean:     104.850 cm/s LVOT VTI:       0.194 m  AORTA Ao Root diam: 2.90 cm Ao Asc diam:  3.20 cm MITRAL VALVE  TRICUSPID VALVE MV Area (PHT): 5.51 cm     TR Peak grad:   31.1 mmHg MV Area VTI:   3.84 cm     TR Vmax:        279.00 cm/s MV Peak grad:  6.2 mmHg MV Mean grad:  3.3 mmHg     SHUNTS MV Vmax:       1.25 m/s     Systemic VTI:  0.19 m MV Vmean:      84.1 cm/s    Systemic Diam: 2.10 cm MV Decel Time: 138 msec MR Peak grad: 97.6 mmHg MR Vmax:      494.00 cm/s MV E velocity: 105.67 cm/s Armanda Magic MD Electronically signed by Armanda Magic MD Signature Date/Time: 09/14/2023/10:48:47 AM    Final (Updated)    DG Knee Complete 4 Views Right Result Date: 09/13/2023 CLINICAL DATA:  pain EXAM: RIGHT KNEE - COMPLETE 4+ VIEW COMPARISON:  None available FINDINGS: Total right knee arthroplasty. No radiographic surgical hardware complication. No evidence of fracture, dislocation, or joint effusion. No evidence of severe arthropathy or other focal bone abnormality. Diffuse subcutaneus soft tissue edema. IMPRESSION: 1. No acute displaced fracture or dislocation. 2. Total right knee arthroplasty. Electronically Signed   By: Tish Frederickson M.D.   On: 09/13/2023 22:56   DG Knee Complete 4 Views Left Result Date: 09/13/2023 CLINICAL DATA:  pain EXAM: LEFT KNEE - COMPLETE 4+ VIEW COMPARISON:  X-ray left knee dated 07/30/2021 FINDINGS: Total left knee arthroplasty. No radiographic findings to suggest surgical hardware complication. No evidence of fracture, dislocation, or joint effusion. No evidence of arthropathy or other focal bone abnormality. Diffuse subcutaneus soft tissue edema. IMPRESSION: 1. No acute displaced fracture or dislocation. 2. Total left knee arthroplasty. Electronically Signed   By: Tish Frederickson M.D.   On: 09/13/2023 22:54   DG Chest 2 View Result Date: 09/13/2023 CLINICAL DATA:  sob EXAM: CHEST - 2 VIEW COMPARISON:  Chest x-ray 08/03/2022, CT chest 03/22/2021 FINDINGS: Persistent  enlarged cardiac silhouette. The heart and mediastinal contours are unchanged. Atherosclerotic plaque. No focal consolidation. No pulmonary edema. No pleural effusion. No pneumothorax. No acute osseous abnormality. IMPRESSION: 1. No active cardiopulmonary disease. 2.  Aortic Atherosclerosis (ICD10-I70.0). Electronically Signed   By: Tish Frederickson M.D.   On: 09/13/2023 22:52     Assessment and Plan:   Acute on Chronic Diastolic Heart Failure  Moderate MR Severe TR -Patient presented with significant lower extremity swelling.  Also reported some shortness of breath.  BNP 359.  Chest x-ray with no active cardiopulmonary disease -Echocardiogram this admission showed EF 65-70%, no regional wall motion abnormalities, normal RV systolic function, moderately elevated PA systolic pressure, moderate left atrial enlargement, severe right atrial enlargement, moderate MR, severe TR -Patient now on IV Lasix 40 mg twice daily. I/Os not recorded - Continues to be volume overloaded on exam with lower extremity swelling. Lungs clear and she has normal WOB on room air  - Continue IV lasix 40 mg BID. Strict I/Os, daily weights  - Continue jardiance 10 mg daily   Permanent atrial fibrillation, now with RVR -Prior to admission, patient was on metoprolol succinate 100 mg every morning, 50 mg every p.m.  -Previously had problems with digoxin -With her valvular abnormalities, biatrial dilation, morbid obesity, likelihood of her maintaining sinus rhythm long-term was poor.  Per Dr. Royann Shivers, probably best managed as permanent atrial fibrillation - Continue metoprolol succinate 100 mg every AM, 50 mg every PM. Since starting back on this regiment, her  HR has improved and is now 100-110s. Suspect HR will further improve as she is diuresed  - BP soft- unlikely to tolerate further titration of metoprolol. If needed, could consider amiodarone for rate control  -Continue Pradaxa 150 mg BID. Was previously on eliquis, but  now on pradaxa due to cost  - TSH <0.010. Ordered free T4. If hyperthyroid, this could be driving tachycardia    Risk Assessment/Risk Scores:    New York Heart Association (NYHA) Functional Class NYHA Class III  CHA2DS2-VASc Score = 4  This indicates a 4.8% annual risk of stroke. The patient's score is based upon: CHF History: 1 HTN History: 0 Diabetes History: 0 Stroke History: 0 Vascular Disease History: 0 Age Score: 2 Gender Score: 1   For questions or updates, please contact [facility] Please consult [email or web address] for contact info under    Signed, Jonita Albee, PA-C  09/14/2023 11:33 AM

## 2023-09-14 NOTE — Telephone Encounter (Signed)
 Patient identification verified by 2 forms. Shade Flood, RN     Tried calling patient to relay provider recommendations. No answer. LVMTCB

## 2023-09-14 NOTE — Progress Notes (Signed)
 PROGRESS NOTE  Julia Mcfarland YNW:[id number] DOB: [birth year]   PCP: Hillery Aldo, NP  Patient is from: Home.  Lives with daughter and grandchild.  Uses cane at baseline.  Works at [employer].  DOA: 09/13/2023 LOS: 0  Chief complaints Chief Complaint  Patient presents with   Leg Swelling     Brief Narrative / Interim history: 78 year old F with PMH of diastolic CHF, PAH, severe TR, moderate to severe MR, PAF on Pradaxa, morbid obesity and lower extremity edema presented to ED with worsening DOE and lower extremity edema despite increased dose of Lasix outpatient, and admitted with A-fib with RVR and acute on chronic diastolic CHF.  In ED, in A-fib with RVR with HR to 120s.  Basic labs without significant finding.  Troponin negative.  EKG features A-fib with RVR.  BNP elevated to 360.  No significant finding on CXR.  Patient was started on IV Lasix and Toprol-XL and admitted for further evaluation and management.  Echocardiogram ordered.  Cardiology consulted.  Subjective: Seen and examined earlier this morning.  No major events this morning.  Remains in A-fib with RVR to 120s.  Reports feeling better.  She says her legs are less tight.  Shortness of breath is improved.  Feels palpitation.  Denies chest pain  Objective: Vitals:   09/14/23 0700 09/14/23 0718 09/14/23 0741 09/14/23 0956  BP: (!) 122/103  107/67 107/67  Pulse: 90  (!) 108 (!) 105  Resp:   20   Temp:  98.8 F (37.1 C) 98.2 F (36.8 C)   TempSrc:  Oral Oral   SpO2: 97%  99%   Weight:      Height:        Examination:  GENERAL: No apparent distress.  Nontoxic. HEENT: MMM.  Vision and hearing grossly intact.  NECK: Supple.  No apparent JVD.  RESP:  No IWOB.  Fair aeration bilaterally. CVS: Irregular rhythm.  HR in 120s on telemetry.  Heart sounds normal.  ABD/GI/GU: BS+. Abd soft, NTND.  MSK/EXT:  Moves extremities. No apparent deformity.  2+ BLE edema.  SKIN: no apparent skin lesion or wound NEURO: Awake, alert and  oriented appropriately.  No apparent focal neuro deficit. PSYCH: Calm. Normal affect.   Procedures:  None  Microbiology summarized: None  Assessment and plan: Persistent A-fib with RVR: Likely due to heart failure exacerbation.  HR ranges from 100s to 120s.  She is on Toprol-XL 100 mg in the morning and 50 mg in the evening.  On Pradaxa for anticoagulation.  TSH normal.  Denies alcohol abuse but admits to social drinking.  Reports compliance with meds. -Increase Toprol to home dose -Continue Pradaxa for anticoagulation -Diuretics for CHF -Optimize electrolytes  Acute on chronic HFpEF: Presents with progressive edema and DOE despite increment of diuretics outpatient.  TTE in 08/2022 with LVEF of 60 to 65%, indeterminate DD, severe LAE and RAE, moderate to severe MR, severe TR and RVSP of 43.7 mmHg.  TTE finding along with lower extremity edema and negative chest x-ray raises concern for right-sided heart failure.  She is on Lasix 60 mg daily with an option to take additional 60 as needed.  Started on IV Lasix.  I&O incomplete.  BP and creatinine stable. -Continue IV Lasix 40 mg twice daily -Continue home Jardiance -Follow cardiology recs -Follow echocardiogram -Strict intake and output, daily weight, renal functions and electrolytes -PT/OT eval  Severe TR/moderate to severe MR -Follow echocardiogram.  CKD ruled out.  Morbid obesity Body mass index is 43.55  kg/m. -Encourage lifestyle change to lose weight.          DVT prophylaxis:   dabigatran (PRADAXA) capsule 150 mg  Code Status: Full code Family Communication: None at bedside Level of care: Progressive Status is: Inpatient Remains inpatient appropriate because: A-fib with RVR, CHF exacerbation   Final disposition: Home once medically stable Consultants:  Cardiology  55 minutes with more than 50% spent in reviewing records, counseling patient/family and coordinating care.   Sch Meds:  Scheduled Meds:   dabigatran  150 mg Oral BID   empagliflozin  10 mg Oral QAC breakfast   furosemide  40 mg Intravenous Q12H   [START ON 09/15/2023] metoprolol succinate  100 mg Oral Daily   And   metoprolol succinate  50 mg Oral QHS   potassium chloride SA  20 mEq Oral Daily   Continuous Infusions: PRN Meds:.  Antimicrobials: Anti-infectives (From admission, onward)    None        I have personally reviewed the following labs and images: CBC: Recent Labs  Lab 09/13/23 2057  WBC 5.6  HGB 13.3  HCT 41.9  MCV 96.8  PLT 194   BMP &GFR Recent Labs  Lab 09/13/23 2057  NA 142  K 4.0  CL 105  CO2 26  GLUCOSE 111*  BUN 15  CREATININE 0.84  CALCIUM 9.0  MG 2.2   Estimated Creatinine Clearance: 65.9 mL/min (by C-G formula based on SCr of 0.84 mg/dL). Liver & Pancreas: No results for input(s): "AST", "ALT", "ALKPHOS", "BILITOT", "PROT", "ALBUMIN" in the last 168 hours. No results for input(s): "LIPASE", "AMYLASE" in the last 168 hours. No results for input(s): "AMMONIA" in the last 168 hours. Diabetic: No results for input(s): "HGBA1C" in the last 72 hours. No results for input(s): "GLUCAP" in the last 168 hours. Cardiac Enzymes: No results for input(s): "CKTOTAL", "CKMB", "CKMBINDEX", "TROPONINI" in the last 168 hours. No results for input(s): "PROBNP" in the last 8760 hours. Coagulation Profile: No results for input(s): "INR", "PROTIME" in the last 168 hours. Thyroid Function Tests: No results for input(s): "TSH", "T4TOTAL", "FREET4", "T3FREE", "THYROIDAB" in the last 72 hours. Lipid Profile: No results for input(s): "CHOL", "HDL", "LDLCALC", "TRIG", "CHOLHDL", "LDLDIRECT" in the last 72 hours. Anemia Panel: No results for input(s): "VITAMINB12", "FOLATE", "FERRITIN", "TIBC", "IRON", "RETICCTPCT" in the last 72 hours. Urine analysis:    Component Value Date/Time   COLORURINE YELLOW 02/28/2017 1154   APPEARANCEUR CLEAR 02/28/2017 1154   LABSPEC 1.010 02/28/2017 1154   PHURINE  6.0 02/28/2017 1154   GLUCOSEU NEGATIVE 02/28/2017 1154   HGBUR NEGATIVE 02/28/2017 1154   BILIRUBINUR neg 07/27/2018 1426   KETONESUR NEGATIVE 02/28/2017 1154   PROTEINUR Negative 07/27/2018 1426   PROTEINUR NEGATIVE 02/28/2017 1154   UROBILINOGEN 0.2 07/27/2018 1426   UROBILINOGEN 1.0 08/21/2008 1407   NITRITE neg 07/27/2018 1426   NITRITE NEGATIVE 02/28/2017 1154   LEUKOCYTESUR Negative 07/27/2018 1426   Sepsis Labs: Invalid input(s): "PROCALCITONIN", "LACTICIDVEN"  Microbiology: No results found for this or any previous visit (from the past 240 hours).  Radiology Studies: DG Knee Complete 4 Views Right Result Date: 09/13/2023 CLINICAL DATA:  pain EXAM: RIGHT KNEE - COMPLETE 4+ VIEW COMPARISON:  None available FINDINGS: Total right knee arthroplasty. No radiographic surgical hardware complication. No evidence of fracture, dislocation, or joint effusion. No evidence of severe arthropathy or other focal bone abnormality. Diffuse subcutaneus soft tissue edema. IMPRESSION: 1. No acute displaced fracture or dislocation. 2. Total right knee arthroplasty. Electronically Signed  By: Tish Frederickson M.D.   On: 09/13/2023 22:56   DG Knee Complete 4 Views Left Result Date: 09/13/2023 CLINICAL DATA:  pain EXAM: LEFT KNEE - COMPLETE 4+ VIEW COMPARISON:  X-ray left knee dated 07/30/2021 FINDINGS: Total left knee arthroplasty. No radiographic findings to suggest surgical hardware complication. No evidence of fracture, dislocation, or joint effusion. No evidence of arthropathy or other focal bone abnormality. Diffuse subcutaneus soft tissue edema. IMPRESSION: 1. No acute displaced fracture or dislocation. 2. Total left knee arthroplasty. Electronically Signed   By: Tish Frederickson M.D.   On: 09/13/2023 22:54   DG Chest 2 View Result Date: 09/13/2023 CLINICAL DATA:  sob EXAM: CHEST - 2 VIEW COMPARISON:  Chest x-ray 08/03/2022, CT chest 03/22/2021 FINDINGS: Persistent enlarged cardiac silhouette. The  heart and mediastinal contours are unchanged. Atherosclerotic plaque. No focal consolidation. No pulmonary edema. No pleural effusion. No pneumothorax. No acute osseous abnormality. IMPRESSION: 1. No active cardiopulmonary disease. 2.  Aortic Atherosclerosis (ICD10-I70.0). Electronically Signed   By: Tish Frederickson M.D.   On: 09/13/2023 22:52      Tyray Proch T. Manna Gose [facility]  If 7PM-7AM, please contact night-coverage [email or web address] 09/14/2023, 10:44 AM

## 2023-09-14 NOTE — Progress Notes (Signed)
 Heart Failure Navigator Progress Note  Assessed for Heart & Vascular TOC clinic readiness.  Patient does not meet criteria due to has scheduled [facility] appointment on  10/11/23, EF  65-70% . No HF TOC.   Navigator will sign off at this time.   Rhae Hammock, BSN, Scientist, clinical (histocompatibility and immunogenetics) Only

## 2023-09-14 NOTE — H&P (Signed)
 History and Physical    MA MUNOZ ZOX:[id number] DOB: Feb 04, 1946 DOA: 09/13/2023  Patient coming from: Home.  Chief Complaint: Shortness of breath and lower extremity edema.  HPI: Julia Mcfarland is a 78 y.o. female with history of chronic diastolic CHF last EF measured was 60 to 65% on 08/04/2022 with history of A-fib presents to the ER because of worsening lower extremity edema and shortness of breath.  Patient states she has recently been to cardiology office and over the last one week has been taking increased dose of Lasix despite which her lower extremity edema has been worsening with increasing exertional dyspnea.  Patient states she has gained weight.  Denies any chest pain productive cough fever or chills.  ED Course: In the ER patient is found to be in A-fib with RVR.  Labs show BNP of 359 troponin of 5 TSH is pending.  Patient was given Lasix 40 mg IV admitted for acute on chronic diastolic CHF.  Review of Systems: As per HPI, rest all negative.   Past Medical History:  Diagnosis Date   Allergy    Arthritis    Chicken pox    Chronic venous insufficiency    a. Uses furosemide prn.   Degenerative joint disease    Morbid obesity (HCC)    Osteoarthritis    PONV (postoperative nausea and vomiting)     Past Surgical History:  Procedure Laterality Date   ABDOMINAL HYSTERECTOMY  1988   CARPAL TUNNEL RELEASE Right    COLONOSCOPY WITH PROPOFOL N/A 09/01/2020   Procedure: COLONOSCOPY WITH PROPOFOL;  Surgeon: Vida Rigger, MD;  Location: [facility];  Service: Endoscopy;  Laterality: N/A;   POLYPECTOMY  09/01/2020   Procedure: POLYPECTOMY;  Surgeon: Vida Rigger, MD;  Location: [facility];  Service: Endoscopy;;   REPLACEMENT TOTAL KNEE Right 2010   TOTAL KNEE ARTHROPLASTY Left 03/07/2017   Procedure: LEFT TOTAL KNEE ARTHROPLASTY;  Surgeon: Cammy Copa, MD;  Location[facility];  Service: Orthopedics;  Laterality: Left;     reports that she has never smoked. She has never used  smokeless tobacco. She reports current alcohol use. She reports that she does not use drugs.  No Known Allergies  Family History  Problem Relation Age of Onset   Arthritis Mother    Heart disease Mother    Hypertension Mother    Diabetes Mother    Stroke Father    Hypertension Father    Stroke Sister    Hypertension Sister    Heart disease Maternal Grandfather    Diabetes Paternal Grandmother    Heart disease Paternal Grandfather    Breast cancer Neg Hx     Prior to Admission medications   Medication Sig Start Date End Date Taking? Authorizing Provider  albuterol (VENTOLIN HFA) 108 (90 Base) MCG/ACT inhaler INHALE 2 PUFFS INTO LUNGS EVERY 6 HOURS AS NEEDED FOR WHEEZING OR SHORTNESS OF BREATH Patient not taking: Reported on 08/30/2023 11/28/20   Eulis Foster, FNP  Ascorbic Acid (VITAMIN C) 1000 MG tablet Take 1,000 mg by mouth daily.    [provider]  cetirizine (ZYRTEC) 10 MG tablet Take 10 mg by mouth daily. 05/10/23   [provider]  Cholecalciferol (VITAMIN D3) 5000 UNITS CAPS Take 5,000 Units by mouth daily.     [provider]  dabigatran (PRADAXA) 150 MG CAPS capsule Take 150 mg by mouth 2 (two) times daily. 08/24/23   [provider]  empagliflozin (JARDIANCE) 10 MG TABS tablet Take 1 tablet (  10 mg total) by mouth daily before breakfast. 04/10/23   Croitoru, Rachelle Hora, MD  furosemide (LASIX) 40 MG tablet Take 1.5 tablets once daily and may take extra 1.5 tablets as needed for swelling 08/24/23   Croitoru, Mihai, MD  hydroxypropyl methylcellulose / hypromellose (ISOPTO TEARS / GONIOVISC) 2.5 % ophthalmic solution Place 1 drop into both eyes daily as needed for dry eyes.    [provider]  metoprolol succinate (TOPROL XL) 50 MG 24 hr tablet Take 2 tablets (100 mg total) by mouth every morning AND 1 tablet (50 mg total) every evening. Take with or immediately following a meal.. 08/30/23   Croitoru, Mihai, MD  polyethylene glycol (MIRALAX /  GLYCOLAX) 17 g packet Take 17 g by mouth daily as needed for mild constipation. Patient not taking: Reported on 08/30/2023    [provider]  potassium chloride SA (KLOR-CON M) 20 MEQ tablet Take 1 tablet (20 mEq total) by mouth daily. 08/24/22   Robbie Lis M, PA-C  vitamin B-12 (CYANOCOBALAMIN) 500 MCG tablet Take 500 mcg by mouth daily.    [provider]    Physical Exam: Constitutional: Moderately built and nourished. Vitals:   09/14/23 0500 09/14/23 0515 09/14/23 0530 09/14/23 0545  BP:  106/74    Pulse: 84 (!) 106 (!) 121 (!) 104  Resp: 19 17 (!) 21 17  Temp:      TempSrc:      SpO2: 97% 96% 96% 96%  Weight:      Height:       Eyes: Anicteric no pallor. ENMT: No discharge from the ears eyes nose or mouth. Neck: No mass felt.  No neck rigidity. Respiratory: No rhonchi or crepitations. Cardiovascular: S1-S2 heard. Abdomen: Soft nontender bowel sound present. Musculoskeletal: Bilateral lower extremity edema 3+. Skin: No rash. Neurologic: Alert awake oriented to time place and person.  Moves all extremities. Psychiatric: Appears normal.  Normal affect.   Labs on Admission: I have personally reviewed following labs and imaging studies  CBC: Recent Labs  Lab 09/13/23 2057  WBC 5.6  HGB 13.3  HCT 41.9  MCV 96.8  PLT 194   Basic Metabolic Panel: Recent Labs  Lab 09/13/23 2057  NA 142  K 4.0  CL 105  CO2 26  GLUCOSE 111*  BUN 15  CREATININE 0.84  CALCIUM 9.0  MG 2.2   GFR: Estimated Creatinine Clearance: 65.9 mL/min (by C-G formula based on SCr of 0.84 mg/dL). Liver Function Tests: No results for input(s): "AST", "ALT", "ALKPHOS", "BILITOT", "PROT", "ALBUMIN" in the last 168 hours. No results for input(s): "LIPASE", "AMYLASE" in the last 168 hours. No results for input(s): "AMMONIA" in the last 168 hours. Coagulation Profile: No results for input(s): "INR", "PROTIME" in the last 168 hours. Cardiac Enzymes: No results for  input(s): "CKTOTAL", "CKMB", "CKMBINDEX", "TROPONINI" in the last 168 hours. BNP (last 3 results) No results for input(s): "PROBNP" in the last 8760 hours. HbA1C: No results for input(s): "HGBA1C" in the last 72 hours. CBG: No results for input(s): "GLUCAP" in the last 168 hours. Lipid Profile: No results for input(s): "CHOL", "HDL", "LDLCALC", "TRIG", "CHOLHDL", "LDLDIRECT" in the last 72 hours. Thyroid Function Tests: No results for input(s): "TSH", "T4TOTAL", "FREET4", "T3FREE", "THYROIDAB" in the last 72 hours. Anemia Panel: No results for input(s): "VITAMINB12", "FOLATE", "FERRITIN", "TIBC", "IRON", "RETICCTPCT" in the last 72 hours. Urine analysis:    Component Value Date/Time   COLORURINE YELLOW 02/28/2017 1154   APPEARANCEUR CLEAR 02/28/2017 1154   LABSPEC 1.010  02/28/2017 1154   PHURINE 6.0 02/28/2017 1154   GLUCOSEU NEGATIVE 02/28/2017 1154   HGBUR NEGATIVE 02/28/2017 1154   BILIRUBINUR neg 07/27/2018 1426   KETONESUR NEGATIVE 02/28/2017 1154   PROTEINUR Negative 07/27/2018 1426   PROTEINUR NEGATIVE 02/28/2017 1154   UROBILINOGEN 0.2 07/27/2018 1426   UROBILINOGEN 1.0 08/21/2008 1407   NITRITE neg 07/27/2018 1426   NITRITE NEGATIVE 02/28/2017 1154   LEUKOCYTESUR Negative 07/27/2018 1426   Sepsis Labs: @LABRCNTIP (procalcitonin:4,lacticidven:4) )No results found for this or any previous visit (from the past 240 hours).   Radiological Exams on Admission: DG Knee Complete 4 Views Right Result Date: 09/13/2023 CLINICAL DATA:  pain EXAM: RIGHT KNEE - COMPLETE 4+ VIEW COMPARISON:  None available FINDINGS: Total right knee arthroplasty. No radiographic surgical hardware complication. No evidence of fracture, dislocation, or joint effusion. No evidence of severe arthropathy or other focal bone abnormality. Diffuse subcutaneus soft tissue edema. IMPRESSION: 1. No acute displaced fracture or dislocation. 2. Total right knee arthroplasty. Electronically Signed   By: Tish Frederickson M.D.   On: 09/13/2023 22:56   DG Knee Complete 4 Views Left Result Date: 09/13/2023 CLINICAL DATA:  pain EXAM: LEFT KNEE - COMPLETE 4+ VIEW COMPARISON:  X-ray left knee dated 07/30/2021 FINDINGS: Total left knee arthroplasty. No radiographic findings to suggest surgical hardware complication. No evidence of fracture, dislocation, or joint effusion. No evidence of arthropathy or other focal bone abnormality. Diffuse subcutaneus soft tissue edema. IMPRESSION: 1. No acute displaced fracture or dislocation. 2. Total left knee arthroplasty. Electronically Signed   By: Tish Frederickson M.D.   On: 09/13/2023 22:54   DG Chest 2 View Result Date: 09/13/2023 CLINICAL DATA:  sob EXAM: CHEST - 2 VIEW COMPARISON:  Chest x-ray 08/03/2022, CT chest 03/22/2021 FINDINGS: Persistent enlarged cardiac silhouette. The heart and mediastinal contours are unchanged. Atherosclerotic plaque. No focal consolidation. No pulmonary edema. No pleural effusion. No pneumothorax. No acute osseous abnormality. IMPRESSION: 1. No active cardiopulmonary disease. 2.  Aortic Atherosclerosis (ICD10-I70.0). Electronically Signed   By: Tish Frederickson M.D.   On: 09/13/2023 22:52    EKG: Independently reviewed.  A-fib with RVR.  Assessment/Plan Principal Problem:   Acute on chronic heart failure with preserved ejection fraction (HFpEF) (HCC) Active Problems:   Atrial fibrillation with RVR (HCC)   Acute CHF (congestive heart failure) (HCC)    Acute on chronic diastolic CHF last EF measured was 60 to 65% on 08/04/2022 with severe MR.  Patient received 40 mg IV Lasix in the ER.  I have placed patient on Lasix 40 mg IV every 12.  Closely follow intake output metabolic panel daily weights and check 2D echo.  Continue Jardiance. A-fib with RVR likely contributing to patient's symptoms.  I have ordered morning dose of metoprolol now.  If heart rate does not improve may need to consider Cardizem infusion.  Check TSH.  On Pradaxa. Chronic  kidney disease stage III creatinine is improved from baseline.  Follow metabolic panel.  Since patient has acute CHF will need IV diuresis and more than 2 midnight stay.   DVT prophylaxis: Pradaxa. Code Status: Full code. Family Communication: Discussed with patient. Disposition Plan: Cardiac telemetry. Consults called: Cardiology. Admission status: Inpatient.

## 2023-09-15 DIAGNOSIS — I071 Rheumatic tricuspid insufficiency: Secondary | ICD-10-CM

## 2023-09-15 DIAGNOSIS — I5030 Unspecified diastolic (congestive) heart failure: Secondary | ICD-10-CM

## 2023-09-15 DIAGNOSIS — I272 Pulmonary hypertension, unspecified: Secondary | ICD-10-CM

## 2023-09-15 DIAGNOSIS — I480 Paroxysmal atrial fibrillation: Secondary | ICD-10-CM

## 2023-09-15 DIAGNOSIS — I4819 Other persistent atrial fibrillation: Secondary | ICD-10-CM | POA: Diagnosis not present

## 2023-09-15 DIAGNOSIS — I340 Nonrheumatic mitral (valve) insufficiency: Secondary | ICD-10-CM

## 2023-09-15 DIAGNOSIS — I5033 Acute on chronic diastolic (congestive) heart failure: Secondary | ICD-10-CM | POA: Diagnosis not present

## 2023-09-15 DIAGNOSIS — I5031 Acute diastolic (congestive) heart failure: Secondary | ICD-10-CM | POA: Diagnosis not present

## 2023-09-15 LAB — BASIC METABOLIC PANEL
Anion gap: 10 (ref 5–15)
BUN: 18 mg/dL (ref 8–23)
CO2: 27 mmol/L (ref 22–32)
Calcium: 8.6 mg/dL — ABNORMAL LOW (ref 8.9–10.3)
Chloride: 104 mmol/L (ref 98–111)
Creatinine, Ser: 1.04 mg/dL — ABNORMAL HIGH (ref 0.44–1.00)
GFR, Estimated: 56 mL/min — ABNORMAL LOW (ref >60)
Glucose, Bld: 131 mg/dL — ABNORMAL HIGH (ref 70–99)
Potassium: 3.4 mmol/L — ABNORMAL LOW (ref 3.5–5.1)
Sodium: 141 mmol/L (ref 135–145)

## 2023-09-15 LAB — MAGNESIUM: Magnesium: 2 mg/dL (ref 1.7–2.4)

## 2023-09-15 MED ORDER — METOPROLOL SUCCINATE ER 100 MG PO TB24
100.0000 mg | ORAL_TABLET | Freq: Every day | ORAL | Status: DC
  Administered 2023-09-15 – 2023-09-16 (×2): 100 mg via ORAL
  Filled 2023-09-15 (×2): qty 1

## 2023-09-15 MED ORDER — METOPROLOL SUCCINATE ER 50 MG PO TB24
50.0000 mg | ORAL_TABLET | Freq: Every day | ORAL | Status: DC
  Administered 2023-09-15: 50 mg via ORAL
  Filled 2023-09-15: qty 1

## 2023-09-15 MED ORDER — FUROSEMIDE 10 MG/ML IJ SOLN
80.0000 mg | Freq: Two times a day (BID) | INTRAMUSCULAR | Status: DC
  Administered 2023-09-15 – 2023-09-18 (×7): 80 mg via INTRAVENOUS
  Filled 2023-09-15 (×7): qty 8

## 2023-09-15 MED ORDER — METOPROLOL SUCCINATE ER 100 MG PO TB24: 100.0000 mg | ORAL_TABLET | Freq: Every day | ORAL | Status: DC

## 2023-09-15 MED ORDER — POTASSIUM CHLORIDE CRYS ER 20 MEQ PO TBCR
40.0000 meq | EXTENDED_RELEASE_TABLET | ORAL | Status: AC
  Administered 2023-09-15 (×2): 40 meq via ORAL
  Filled 2023-09-15 (×2): qty 2

## 2023-09-15 MED ORDER — FUROSEMIDE 10 MG/ML IJ SOLN: 80.0000 mg | Freq: Two times a day (BID) | INTRAMUSCULAR | Status: DC

## 2023-09-15 MED ORDER — ENSURE ENLIVE PO LIQD
237.0000 mL | Freq: Two times a day (BID) | ORAL | Status: DC
  Administered 2023-09-15 – 2023-09-23 (×15): 237 mL via ORAL

## 2023-09-15 MED ORDER — METOPROLOL SUCCINATE ER 50 MG PO TB24: 50.0000 mg | ORAL_TABLET | Freq: Every day | ORAL | Status: DC

## 2023-09-15 NOTE — Progress Notes (Addendum)
 PROGRESS NOTE  SIHAAM CHROBAK BMW:[id number] DOB: 04/01/1946   PCP: Hillery Aldo, NP  Patient is from: Home.  Lives with daughter and grandchild.  Uses cane at baseline.  Works at [employer].  DOA: 09/13/2023 LOS: 1  Chief complaints Chief Complaint  Patient presents with   Leg Swelling     Brief Narrative / Interim history: 78 year old F with PMH of diastolic CHF, PAH, sev TR, mod-sev MR, PAF on Pradaxa, morbid obesity and lower extremity edema presented to ED with worsening DOE and lower extremity edema despite increased dose of Lasix outpatient, and admitted with A-fib with RVR and acute on chronic diastolic CHF.  In ED, in A-fib with RVR with HR to 120s.  Basic labs without significant finding.  Troponin negative.  EKG features A-fib with RVR.  BNP elevated to 360.  No significant finding on CXR.  Patient was started on IV Lasix and Toprol-XL and admitted for further evaluation and management.  Echocardiogram ordered.  Cardiology consulted.  TTE with LVEF of 65 to 70%, indeterminate DD, RVSP of 46, mod LAE, sev RAE, mod MR and sev TR. Thyroid panel suggested significant hyperthyroidism.  Remains on IV Lasix.  Cardiology following.  On home Toprol-XL for A-fib.  Subjective: Seen and examined earlier this morning.  No major events overnight of this morning.  Feels better.  Remains in mild RVR.  Soft blood pressure.  Reports improvement in leg swelling.  Objective: Vitals:   09/15/23 0710 09/15/23 0748 09/15/23 0749 09/15/23 1030  BP: 103/75 (!) 88/59 94/66 104/74  Pulse: (!) 118 (!) 128    Resp: 19     Temp: 98.9 F (37.2 C)     TempSrc: Oral     SpO2: 98% 97%    Weight:      Height:        Examination:  GENERAL: No apparent distress.  Nontoxic. HEENT: MMM.  Vision and hearing grossly intact.  NECK: Supple.  No apparent JVD.  RESP:  No IWOB.  Fair aeration bilaterally. CVS: Irregular rhythm.  HR ranges from 100s to 120s.  Heart sounds normal.  ABD/GI/GU: BS+. Abd soft,  NTND.  MSK/EXT:  Moves extremities. No apparent deformity.  2+ BLE edema.  SKIN: no apparent skin lesion or wound NEURO: Awake, alert and oriented appropriately.  No apparent focal neuro deficit. PSYCH: Calm. Normal affect.   Procedures:  None  Microbiology summarized: None  Assessment and plan: Persistent A-fib with RVR: Likely due to heart failure exacerbation.  HR ranges from 100s to 120s.  She is on Toprol-XL 100 mg in the morning and 50 mg in the evening.  On Pradaxa for anticoagulation.   Denies alcohol abuse but admits to social drinking.  Reports compliance with meds.  Thyroid panel suggests primary hypothyroidism with undetectable TSH and free T4 of 3.26. -Continue home Toprol-XL-100 mg in the morning and 50 mg at night. -Continue Pradaxa for anticoagulation -Diuretics for CHF -Optimize electrolytes -Awaiting callback from [facility] endocrinology for insight on hyperthyroidism.  Also left message for Dr. Sharl Ma with [facility]  Acute on chronic HFpEF: Presents with progressive edema and DOE despite increment of diuretics outpatient.  TTE as above (basically unchanged from prior).  TTE finding along with lower extremity edema and negative chest x-ray raises concern for right-sided heart failure.  She is on Lasix 60 mg daily with an option to take additional 60 as needed.  Started on IV Lasix.  About 1.5 L UOP overnight.  Soft BP. Cr stable.  -Appreciate  cardiology recs-increased IV Lasix to 80 mg twice daily -Continue home Jardiance -Strict intake and output, daily weight, renal functions and electrolytes -PT/OT eval  Severe TR/moderate MR -Per cardiology.  Primary hyperthyroidism: Thyroid panel suggests primary hypothyroidism with undetectable TSH and free T4 of 3.26. -Awaiting callback from [facility] endocrinology for insight on hyperthyroidism.  Also left message for Dr. Sharl Ma with [facility] -Continue beta-blocker -May add methimazole after discussion with  endocrinologist.  Hypokalemia -Monitor replenish as appropriate  CKD ruled out.  Morbid obesity Body mass index is 46.17 kg/m. -Encourage lifestyle change to lose weight.          DVT prophylaxis:   dabigatran (PRADAXA) capsule 150 mg  Code Status: Full code Family Communication: None at bedside Level of care: Progressive Status is: Inpatient Remains inpatient appropriate because: A-fib with RVR, diastolic CHF exacerbation and primary hyperthyroidism   Final disposition: Home once medically stable Consultants:  Cardiology  55 minutes with more than 50% spent in reviewing records, counseling patient/family and coordinating care.   Sch Meds:  Scheduled Meds:  dabigatran  150 mg Oral BID   empagliflozin  10 mg Oral QAC breakfast   feeding supplement  237 mL Oral BID BM   furosemide  80 mg Intravenous BID   metoprolol succinate  100 mg Oral Daily   And   metoprolol succinate  50 mg Oral QHS   potassium chloride  40 mEq Oral Q4H   Continuous Infusions: PRN Meds:.  Antimicrobials: Anti-infectives (From admission, onward)    None        I have personally reviewed the following labs and images: CBC: Recent Labs  Lab 09/13/23 2057 09/14/23 1144  WBC 5.6 4.7  NEUTROABS  --  2.8  HGB 13.3 12.8  HCT 41.9 39.5  MCV 96.8 96.1  PLT 194 168   BMP &GFR Recent Labs  Lab 09/13/23 2057 09/14/23 1144 09/15/23 0308  NA 142 141 141  K 4.0 4.3 3.4*  CL 105 106 104  CO2 26 20* 27  GLUCOSE 111* 213* 131*  BUN 15 16 18   CREATININE 0.84 1.02* 1.04*  CALCIUM 9.0 8.6* 8.6*  MG 2.2 2.1 2.0   Estimated Creatinine Clearance: 55.1 mL/min (A) (by C-G formula based on SCr of 1.04 mg/dL (H)). Liver & Pancreas: Recent Labs  Lab 09/14/23 1144  AST 43*  ALT 21  ALKPHOS 97  BILITOT 1.9*  PROT 6.2*  ALBUMIN 3.0*   No results for input(s): "LIPASE", "AMYLASE" in the last 168 hours. No results for input(s): "AMMONIA" in the last 168 hours. Diabetic: No results  for input(s): "HGBA1C" in the last 72 hours. No results for input(s): "GLUCAP" in the last 168 hours. Cardiac Enzymes: No results for input(s): "CKTOTAL", "CKMB", "CKMBINDEX", "TROPONINI" in the last 168 hours. No results for input(s): "PROBNP" in the last 8760 hours. Coagulation Profile: No results for input(s): "INR", "PROTIME" in the last 168 hours. Thyroid Function Tests: Recent Labs    09/14/23 0655 09/14/23 1145  TSH <0.010*  --   FREET4  --  3.26*   Lipid Profile: No results for input(s): "CHOL", "HDL", "LDLCALC", "TRIG", "CHOLHDL", "LDLDIRECT" in the last 72 hours. Anemia Panel: No results for input(s): "VITAMINB12", "FOLATE", "FERRITIN", "TIBC", "IRON", "RETICCTPCT" in the last 72 hours. Urine analysis:    Component Value Date/Time   COLORURINE YELLOW 02/28/2017 1154   APPEARANCEUR CLEAR 02/28/2017 1154   LABSPEC 1.010 02/28/2017 1154   PHURINE 6.0 02/28/2017 1154   GLUCOSEU NEGATIVE 02/28/2017 1154   HGBUR  NEGATIVE 02/28/2017 1154   BILIRUBINUR neg 07/27/2018 1426   KETONESUR NEGATIVE 02/28/2017 1154   PROTEINUR Negative 07/27/2018 1426   PROTEINUR NEGATIVE 02/28/2017 1154   UROBILINOGEN 0.2 07/27/2018 1426   UROBILINOGEN 1.0 08/21/2008 1407   NITRITE neg 07/27/2018 1426   NITRITE NEGATIVE 02/28/2017 1154   LEUKOCYTESUR Negative 07/27/2018 1426   Sepsis Labs: Invalid input(s): "PROCALCITONIN", "LACTICIDVEN"  Microbiology: No results found for this or any previous visit (from the past 240 hours).  Radiology Studies: No results found.     Dorotea Hand T. [facility]  If 7PM-7AM, please contact night-coverage [email or web address] 09/15/2023, 11:30 AM

## 2023-09-15 NOTE — Progress Notes (Signed)
 Mobility Specialist Progress Note:   09/15/23 0930  Mobility  Activity Ambulated with assistance in room  Level of Assistance Standby assist, set-up cues, supervision of patient - no hands on  Assistive Device None  Distance Ambulated (ft) 50 ft  Activity Response Tolerated well  Mobility Referral Yes  Mobility visit 1 Mobility  Mobility Specialist Start Time (ACUTE ONLY) 0930  Mobility Specialist Stop Time (ACUTE ONLY) 0945  Mobility Specialist Time Calculation (min) (ACUTE ONLY) 15 min   Pt agreeable to mobility session. Required no physical assistance to ambulate in room. HR up to 141bpm with short distance. Pt c/o BLE swelling, denies SOB. SpO2 98% on RA. Pt left sitting EOB with all needs met.   Addison Lank Mobility Specialist Please contact via SecureChat or  Rehab office at [phone]

## 2023-09-15 NOTE — Evaluation (Signed)
 Occupational Therapy Evaluation Patient Details Name: Julia Mcfarland MRN: [id number] DOB: 04/11/1946 Today's Date: 09/15/2023   History of Present Illness   Pt is a 78 yo female presenting [facility] on 09/13[facility]23 with worsening BLE edema and SOB. PMH of CHF, afib, morbid obesity, OA, DJD, L TKA.     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 Pt currently ambulatory with SPC + close supervision in hallway, no SOB noted with Sp02 >98% on RA. Pt demonstrated capacity to complete ADLs with setup/supervision assist. She was educated on CHF signs and prevention but would benefit from reinforcement. OT to continue following pt acutely to reinforce education and optimize functional independence before DC. No post acute OT recommended upon DC.      If plan is discharge home, recommend the following:   Assistance with cooking/housework     Functional Status Assessment   Patient has had a recent decline in their functional status and demonstrates the ability to make significant improvements in function in a reasonable and predictable amount of time.     Equipment Recommendations   None recommended by OT     Recommendations for Other Services         Precautions/Restrictions   Precautions Precautions: Fall Recall of Precautions/Restrictions: Intact Restrictions Weight Bearing Restrictions Per Provider Order: No     Mobility Bed Mobility Overal bed mobility: Modified Independent             General bed mobility comments: Increased time    Transfers Overall transfer level: Needs assistance Equipment used: Straight cane Transfers: Sit to/from Stand Sit to Stand: Supervision           General transfer comment: supervision for safety      Balance Overall balance assessment: Mild deficits observed, not formally tested                                          ADL either performed or assessed with clinical judgement   ADL Overall ADL's : Needs assistance/impaired Eating/Feeding: Independent;Sitting   Grooming: Standing;Supervision/safety   Upper Body Bathing: Sitting;Set up   Lower Body Bathing: Sitting/lateral leans;Supervison/ safety Lower Body Bathing Details (indicate cue type and reason): simulated at EOB Upper Body Dressing : Sitting;Set up   Lower Body Dressing: Supervision/safety;Sitting/lateral leans;Sit to/from stand Lower Body Dressing Details (indicate cue type and reason): wears slip ons at baseline, simulated putting on LB garments (pants,etc) Toilet Transfer: Supervision/safety;Ambulation   Toileting- Clothing Manipulation and Hygiene: Supervision/safety;Sit to/from stand       Functional mobility during ADLs: Supervision/safety;Cane General ADL Comments: Discussed pacing self at work when fatigued or feeling SOB, educated pt on CHF diet and management     Vision         Perception         Praxis         Pertinent Vitals/Pain Pain Assessment Pain Assessment: Faces Faces Pain Scale: Hurts a little bit Pain Location: BLEs Pain Descriptors / Indicators: Sore Pain Intervention(s): Monitored during session     Extremity/Trunk Assessment Upper Extremity Assessment Upper Extremity Assessment: Generalized weakness (bilat shoulders generally weak with 3/5 MMT, ROM wfl. elbow flex/ext MMT 4/5)   Lower Extremity Assessment Lower Extremity Assessment: Defer to PT evaluation (BLE edema)  Communication Communication Communication: No apparent difficulties   Cognition Arousal: Alert Behavior During Therapy: WFL for tasks assessed/performed Cognition: No apparent impairments                               Following commands: Intact       Cueing  General Comments   Cueing Techniques: Verbal cues  HR in afib througout entire session despite  resting, up to 136 bpm with OOB ambulation   Exercises     Shoulder Instructions      Home Living Family/patient expects to be discharged to:: Private residence Living Arrangements: Children;Other relatives (daughter and grandchildren) Available Help at Discharge: Family;Available PRN/intermittently Type of Home: House Home Access: Stairs to enter [employer] of Steps: 2 at the front door Entrance Stairs-Rails: None Home Layout: One level     Bathroom Shower/Tub: Chief Strategy Officer: Handicapped height     Home Equipment: Shower seat;Cane - single Librarian, academic (2 wheels);BSC/3in1   Additional Comments: works at [employer], does yard work.      Prior Functioning/Environment Prior Level of Function : Working/employed;Driving             Mobility Comments: Mod I using spc ADLs Comments: Ind ADLs/iADLs    OT Problem List: Decreased strength;Decreased knowledge of precautions   OT Treatment/Interventions: Self-care/ADL training;Balance training;Therapeutic exercise;Therapeutic activities;Patient/family education;DME and/or AE instruction      OT Goals(Current goals can be found in the care plan section)   Acute Rehab OT Goals Patient Stated Goal: To go home, get better OT Goal Formulation: With patient Time For Goal Achievement: 09/29/23 ADL Goals Pt Will Perform Grooming: with modified independence;standing Pt Will Perform Lower Body Bathing: with modified independence;sitting/lateral leans Pt Will Perform Lower Body Dressing: with modified independence;sit to/from stand Pt Will Transfer to Toilet: with modified independence;ambulating Pt Will Perform Toileting - Clothing Manipulation and hygiene: with modified independence;sit to/from stand Additional ADL Goal #3: Pt will verablize understanding of CHF signs and prevention   OT Frequency:  Min 2X/week    Co-evaluation              AM-PAC OT "6 Clicks" Daily Activity      Outcome Measure Help from another person eating meals?: None Help from another person taking care of personal grooming?: A Little Help from another person toileting, which includes using toliet, bedpan, or urinal?: A Little Help from another person bathing (including washing, rinsing, drying)?: A Little Help from another person to put on and taking off regular upper body clothing?: A Little Help from another person to put on and taking off regular lower body clothing?: A Little 6 Click Score: 19   End of Session Equipment Utilized During Treatment: Gait belt;Other (comment) [facility]) Nurse Communication: Mobility status  Activity Tolerance: Patient tolerated treatment well Patient left: in bed;with call bell/phone within reach  OT Visit Diagnosis: Other (comment) (SOB)                Time: 1530-1600 OT Time Calculation (min): 30 min Charges:  OT General Charges $OT Visit: 1 Visit OT Evaluation $OT Eval Low Complexity: 1 Low OT Treatments $Therapeutic Activity: 8-22 mins  09/15/2023  AB, OTR/L  Acute Rehabilitation Services  Office: [phone]   Tristan Schroeder 09/15/2023, 6:16 PM

## 2023-09-15 NOTE — Plan of Care (Signed)
  Problem: Health Behavior/Discharge Planning: Goal: Ability to manage health-related needs will improve Outcome: Progressing   Problem: Clinical Measurements: Goal: Will remain free from infection Outcome: Progressing Goal: Respiratory complications will improve Outcome: Progressing   Problem: Activity: Goal: Risk for activity intolerance will decrease Outcome: Progressing

## 2023-09-15 NOTE — Progress Notes (Signed)
 Rounding Note    Patient Name: Julia Mcfarland Date of Encounter: 09/15/2023  [facility] Cardiologist: Thurmon Fair, MD   Subjective   Her weight is up. She is not at her baseline. Bps soft in the AM  Inpatient Medications    Scheduled Meds:  dabigatran  150 mg Oral BID   empagliflozin  10 mg Oral QAC breakfast   furosemide  40 mg Intravenous Q12H   metoprolol succinate  100 mg Oral Daily   And   metoprolol succinate  50 mg Oral QHS   potassium chloride  40 mEq Oral Q4H   Continuous Infusions:  PRN Meds:    Vital Signs    Vitals:   09/15/23 0657 09/15/23 0710 09/15/23 0748 09/15/23 0749  BP:  103/75 (!) 88/59 94/66  Pulse:  (!) 118 (!) 128   Resp:  19    Temp:  98.9 F (37.2 C)    TempSrc:  Oral    SpO2:  98% 97%   Weight: 114.5 kg     Height:        Intake/Output Summary (Last 24 hours) at 09/15/2023 0852 Last data filed at 09/15/2023 0357 Gross per 24 hour  Intake 657 ml  Output 1450 ml  Net -793 ml      09/15/2023    6:57 AM 09/14/2023    3:08 PM 09/13/2023    8:48 PM  Last 3 Weights  Weight (lbs) 252 lb 6.8 oz 263 lb 0.1 oz 238 lb 1.6 oz  Weight (kg) 114.5 kg 119.3 kg 108 kg      Telemetry    Atrial fibrillation 1teens-120s - Personally Reviewed  ECG    No new - Personally Reviewed  Physical Exam   Vitals:   09/15/23 0748 09/15/23 0749  BP: (!) 88/59 94/66  Pulse: (!) 128   Resp:    Temp:    SpO2: 97%     Gen: well appearing, not on O2 Neck: + JVD (likely chronic with TR) Neuro: alert and oriented CV: IRRR, murmur not audible Pulm: nl wob, mildly decreased BS Abd: non distended Ext: pitting LE edema, mild erythema, oozing Skin: warm and well perfused Psych: normal mood  Labs    High Sensitivity Troponin:   Recent Labs  Lab 09/13/23 2057  TROPONINIHS 5     Chemistry Recent Labs  Lab 09/13/23 2057 09/14/23 1144 09/15/23 0308  NA 142 141 141  K 4.0 4.3 3.4*  CL 105 106 104  CO2 26 20* 27  GLUCOSE  111* 213* 131*  BUN 15 16 18   CREATININE 0.84 1.02* 1.04*  CALCIUM 9.0 8.6* 8.6*  MG 2.2 2.1 2.0  PROT  --  6.2*  --   ALBUMIN  --  3.0*  --   AST  --  43*  --   ALT  --  21  --   ALKPHOS  --  97  --   BILITOT  --  1.9*  --   GFRNONAA >60 57* 56*  ANIONGAP 11 15 10     Lipids No results for input(s): "CHOL", "TRIG", "HDL", "LABVLDL", "LDLCALC", "CHOLHDL" in the last 168 hours.  Hematology Recent Labs  Lab 09/13/23 2057 09/14/23 1144  WBC 5.6 4.7  RBC 4.33 4.11  HGB 13.3 12.8  HCT 41.9 39.5  MCV 96.8 96.1  MCH 30.7 31.1  MCHC 31.7 32.4  RDW 14.6 14.8  PLT 194 168   Thyroid  Recent Labs  Lab 09/14/23 0655 09/14/23 1145  TSH <0.010*  --   FREET4  --  3.26*    BNP Recent Labs  Lab 09/13/23 2057  BNP 359.3*    DDimer No results for input(s): "DDIMER" in the last 168 hours.   Radiology    ECHOCARDIOGRAM COMPLETE Result Date: 09/14/2023    ECHOCARDIOGRAM REPORT   Patient Name:   Julia Mcfarland Date of Exam: 09/14/2023 Medical Rec #:  [id number]      Height:       62.0 in Accession #:    [id number]     Weight:       238.1 lb Date of Birth:  [birth year]      BSA:          2.059 m Patient Age:    78 years       BP:           99/67 mmHg Patient Gender: F              HR:           123 bpm. Exam Location:  Inpatient Procedure: 2D Echo, Color Doppler and Cardiac Doppler (Both Spectral and Color            Flow Doppler were utilized during procedure). Indications:     CHF  History:         Patient has prior history of Echocardiogram examinations. CHF;                  Arrythmias:Atrial Fibrillation.  Sonographer:     Lamont Snowball Referring Phys:  [id number] Eduard Clos Diagnosing Phys: Armanda Magic MD IMPRESSIONS  1. Left ventricular ejection fraction, by estimation, is 65 to 70%. The left ventricle has normal function. The left ventricle has no regional wall motion abnormalities. Left ventricular diastolic function could not be evaluated.  2. Right ventricular systolic function is  normal. The right ventricular size is severely enlarged. There is moderately elevated pulmonary artery systolic pressure. The estimated right ventricular systolic pressure is 46.1 mmHg.  3. Left atrial size was moderately dilated.  4. Right atrial size was severely dilated.  5. The mitral valve is normal in structure. Moderate mitral valve regurgitation. No evidence of mitral stenosis.  6. 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  7. The aortic valve is normal in structure. Aortic valve regurgitation is not visualized. No aortic stenosis is present.  8. The inferior vena cava is dilated in size with <50% respiratory variability, suggesting right atrial pressure of 15 mmHg. FINDINGS  Left Ventricle: Left ventricular ejection fraction, by estimation, is 65 to 70%. The left ventricle has normal function. The left ventricle has no regional wall motion abnormalities. The left ventricular internal cavity size was normal in size. There is  no left ventricular hypertrophy. Left ventricular diastolic function could not be evaluated due to atrial fibrillation. Left ventricular diastolic function could not be evaluated. Right Ventricle: The right ventricular size is severely enlarged. No increase in right ventricular wall thickness. Right ventricular systolic function is normal. There is moderately elevated pulmonary artery systolic pressure. The tricuspid regurgitant velocity is 2.79 m/s, and with an assumed right atrial pressure of 15 mmHg, the estimated right ventricular systolic pressure is 46.1 mmHg. Left Atrium: Left atrial size was moderately dilated. Right Atrium: Right atrial size was severely dilated. Pericardium: There is no evidence of pericardial effusion. Mitral Valve: The mitral valve is normal in structure. Mild mitral annular calcification. Moderate mitral valve regurgitation, with eccentric posteriorly directed jet. No evidence of  mitral valve stenosis. MV peak gradient, 6.2 mmHg. The mean mitral valve  gradient is 3.3 mmHg. Tricuspid Valve: The tricuspid valve is normal in structure. 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 No evidence of tricuspid stenosis. Aortic Valve: The aortic valve is normal in structure. Aortic valve regurgitation is not visualized. No aortic stenosis is present. Aortic valve peak gradient measures 13.7 mmHg. Pulmonic Valve: The pulmonic valve was normal in structure. Pulmonic valve regurgitation is not visualized. No evidence of pulmonic stenosis. Aorta: The aortic root is normal in size and structure. Venous: The inferior vena cava is dilated in size with less than 50% respiratory variability, suggesting right atrial pressure of 15 mmHg. IAS/Shunts: No atrial level shunt detected by color flow Doppler.  LEFT VENTRICLE PLAX 2D LVIDd:         4.00 cm LVIDs:         3.10 cm LV PW:         0.90 cm LV IVS:        0.70 cm LVOT diam:     2.10 cm LV SV:         67 LV SV Index:   33 LVOT Area:     3.46 cm  RIGHT VENTRICLE          IVC RV Basal diam:  5.60 cm  IVC diam: 2.90 cm LEFT ATRIUM             Index        RIGHT ATRIUM           Index LA Vol (A2C):   94.5 ml 45.90 ml/m  RA Area:     26.50 cm LA Vol (A4C):   76.5 ml 37.16 ml/m  RA Volume:   88.90 ml  43.18 ml/m LA Biplane Vol: 91.8 ml 44.59 ml/m  AORTIC VALVE AV Area (Vmax): 2.92 cm AV Vmax:        185.33 cm/s AV Peak Grad:   13.7 mmHg LVOT Vmax:      156.25 cm/s LVOT Vmean:     104.850 cm/s LVOT VTI:       0.194 m  AORTA Ao Root diam: 2.90 cm Ao Asc diam:  3.20 cm MITRAL VALVE                TRICUSPID VALVE MV Area (PHT): 5.51 cm     TR Peak grad:   31.1 mmHg MV Area VTI:   3.84 cm     TR Vmax:        279.00 cm/s MV Peak grad:  6.2 mmHg MV Mean grad:  3.3 mmHg     SHUNTS MV Vmax:       1.25 m/s     Systemic VTI:  0.19 m MV Vmean:      84.1 cm/s    Systemic Diam: 2.10 cm MV Decel Time: 138 msec MR Peak grad: 97.6 mmHg MR Vmax:      494.00 cm/s MV E velocity: 105.67 cm/s Armanda Magic MD Electronically signed by Armanda Magic MD  Signature Date/Time: 09/14/2023/10:48:47 AM    Final (Updated)    DG Knee Complete 4 Views Right Result Date: 09/13/2023 CLINICAL DATA:  pain EXAM: RIGHT KNEE - COMPLETE 4+ VIEW COMPARISON:  None available FINDINGS: Total right knee arthroplasty. No radiographic surgical hardware complication. No evidence of fracture, dislocation, or joint effusion. No evidence of severe arthropathy or other focal bone abnormality. Diffuse subcutaneus soft tissue edema. IMPRESSION: 1. No acute displaced fracture or dislocation. 2. Total right  knee arthroplasty. Electronically Signed   By: Tish Frederickson M.D.   On: 09/13/2023 22:56   DG Knee Complete 4 Views Left Result Date: 09/13/2023 CLINICAL DATA:  pain EXAM: LEFT KNEE - COMPLETE 4+ VIEW COMPARISON:  X-ray left knee dated 07/30/2021 FINDINGS: Total left knee arthroplasty. No radiographic findings to suggest surgical hardware complication. No evidence of fracture, dislocation, or joint effusion. No evidence of arthropathy or other focal bone abnormality. Diffuse subcutaneus soft tissue edema. IMPRESSION: 1. No acute displaced fracture or dislocation. 2. Total left knee arthroplasty. Electronically Signed   By: Tish Frederickson M.D.   On: 09/13/2023 22:54   DG Chest 2 View Result Date: 09/13/2023 CLINICAL DATA:  sob EXAM: CHEST - 2 VIEW COMPARISON:  Chest x-ray 08/03/2022, CT chest 03/22/2021 FINDINGS: Persistent enlarged cardiac silhouette. The heart and mediastinal contours are unchanged. Atherosclerotic plaque. No focal consolidation. No pulmonary edema. No pleural effusion. No pneumothorax. No acute osseous abnormality. IMPRESSION: 1. No active cardiopulmonary disease. 2.  Aortic Atherosclerosis (ICD10-I70.0). Electronically Signed   By: Tish Frederickson M.D.   On: 09/13/2023 22:52    Cardiac Studies   TTE 09/14/2023 1. Left ventricular ejection fraction, by estimation, is 65 to 70%. The  left ventricle has normal function. The left ventricle has no regional  wall  motion abnormalities. Left ventricular diastolic function could not  be evaluated.   2. Right ventricular systolic function is normal. The right ventricular  size is severely enlarged. There is moderately elevated pulmonary artery  systolic pressure. The estimated right ventricular systolic pressure is  46.1 mmHg.   3. Left atrial size was moderately dilated.   4. Right atrial size was severely dilated.   5. The mitral valve is normal in structure. Moderate mitral valve  regurgitation. No evidence of mitral stenosis.   6. 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   7. The aortic valve is normal in structure. Aortic valve regurgitation is  not visualized. No aortic stenosis is present.   8. The inferior vena cava is dilated in size with <50% respiratory  variability, suggesting right atrial pressure of 15 mmHg.    Patient Profile     Mrs. Matus is a 78 year old female patient of Dr. Royann Shivers, who has history of diastolic heart failure, atrial fibrillation, whom we were consulted for decompensated diastolic heart failure.   Assessment & Plan    Diastolic CHF Severe TR she had assessment for cardiac amyloid with severe biatrial enlargement.  She underwent a cardiac MRI which did not show any LGE to suggest amyloid.  LVEF was 52%. she also had moderate atrial functional MR, and low normal RV function.  Prior echo noted severe TR. she is here with decompensated heart failure.  On admission, to me she stated that she needed to sleep slightly propped up on pillows, she has felt overall fatigued, she has significant lower extremity edema with oozing.  Unclear what instigated this decompensation. She was noted to weigh 238 in clinic a few weeks ago and appeared euvolemic. BNP here only mildly elevated. - weight 252, goal closer to 238. Net negative 793, will increase her dose to IV 80 mg BID - Continue jardiance 10 mg daily   Paroxysmal atrial fibrillation Which is considered essentially  permanent at this point with significant left atrial enlargement. She is relatively rate controlled here, with episodes of RVR. She is asymptomatic from this. She is continued on beta-blocker therapy. She is on Pradaxa for anticoagulation.  -Continue metoprolol succinate 100 mg every AM, 50  mg every PM.  -Continue Pradaxa 150 mg BID   Hyperthyroidism -per IM  For questions or updates, please contact [facility] Please consult [email or web address] for contact info under        Signed, Maisie Fus, MD  09/15/2023, 8:52 AM

## 2023-09-16 ENCOUNTER — Inpatient Hospital Stay (HOSPITAL_COMMUNITY)

## 2023-09-16 DIAGNOSIS — I5031 Acute diastolic (congestive) heart failure: Secondary | ICD-10-CM | POA: Diagnosis not present

## 2023-09-16 DIAGNOSIS — I480 Paroxysmal atrial fibrillation: Secondary | ICD-10-CM | POA: Diagnosis not present

## 2023-09-16 DIAGNOSIS — I4819 Other persistent atrial fibrillation: Secondary | ICD-10-CM | POA: Diagnosis not present

## 2023-09-16 DIAGNOSIS — I5030 Unspecified diastolic (congestive) heart failure: Secondary | ICD-10-CM | POA: Diagnosis not present

## 2023-09-16 DIAGNOSIS — I5033 Acute on chronic diastolic (congestive) heart failure: Secondary | ICD-10-CM | POA: Diagnosis not present

## 2023-09-16 DIAGNOSIS — E0590 Thyrotoxicosis, unspecified without thyrotoxic crisis or storm: Secondary | ICD-10-CM

## 2023-09-16 LAB — BASIC METABOLIC PANEL
Anion gap: 10 (ref 5–15)
BUN: 23 mg/dL (ref 8–23)
CO2: 27 mmol/L (ref 22–32)
Calcium: 8.8 mg/dL — ABNORMAL LOW (ref 8.9–10.3)
Chloride: 105 mmol/L (ref 98–111)
Creatinine, Ser: 1.08 mg/dL — ABNORMAL HIGH (ref 0.44–1.00)
GFR, Estimated: 53 mL/min — ABNORMAL LOW (ref >60)
Glucose, Bld: 134 mg/dL — ABNORMAL HIGH (ref 70–99)
Potassium: 3.7 mmol/L (ref 3.5–5.1)
Sodium: 142 mmol/L (ref 135–145)

## 2023-09-16 LAB — MAGNESIUM: Magnesium: 2 mg/dL (ref 1.7–2.4)

## 2023-09-16 LAB — T3: T3, Total: 152 ng/dL (ref 71–180)

## 2023-09-16 MED ORDER — POTASSIUM CHLORIDE CRYS ER 20 MEQ PO TBCR
40.0000 meq | EXTENDED_RELEASE_TABLET | Freq: Once | ORAL | Status: AC
  Administered 2023-09-16: 40 meq via ORAL
  Filled 2023-09-16: qty 2

## 2023-09-16 MED ORDER — METOPROLOL SUCCINATE ER 100 MG PO TB24
100.0000 mg | ORAL_TABLET | Freq: Every day | ORAL | Status: DC
  Administered 2023-09-17 – 2023-09-20 (×4): 100 mg via ORAL
  Filled 2023-09-16 (×6): qty 1

## 2023-09-16 MED ORDER — METOPROLOL SUCCINATE ER 100 MG PO TB24
100.0000 mg | ORAL_TABLET | Freq: Every day | ORAL | Status: DC
  Administered 2023-09-16 – 2023-09-21 (×6): 100 mg via ORAL
  Filled 2023-09-16 (×6): qty 1

## 2023-09-16 MED ORDER — METHIMAZOLE 10 MG PO TABS
10.0000 mg | ORAL_TABLET | Freq: Two times a day (BID) | ORAL | Status: DC
  Administered 2023-09-16 – 2023-09-23 (×15): 10 mg via ORAL
  Filled 2023-09-16 (×16): qty 1

## 2023-09-16 MED ORDER — METOPROLOL TARTRATE 5 MG/5ML IV SOLN: 5.0000 mg | INTRAVENOUS | Status: DC | PRN

## 2023-09-16 NOTE — Progress Notes (Signed)
 Mobility Specialist Progress Note:   09/16/23 0915  Mobility  Activity Ambulated independently in room  Level of Assistance Modified independent, requires aide device or extra time  Assistive Device None  Distance Ambulated (ft) 50 ft  Activity Response Tolerated well  Mobility Referral Yes  Mobility visit 1 Mobility  Mobility Specialist Start Time (ACUTE ONLY) 0915  Mobility Specialist Stop Time (ACUTE ONLY) 0930  Mobility Specialist Time Calculation (min) (ACUTE ONLY) 15 min   Pt agreeable to mobility session. HR continues to be elevated to 130s with short room distances. No symptoms throughout. Pt back sitting EOB with all needs met.   Addison Lank Mobility Specialist Please contact via SecureChat or  Rehab office at [phone]

## 2023-09-16 NOTE — Progress Notes (Signed)
 PROGRESS NOTE  Julia Mcfarland ZOX:[id number] DOB: 06-05-1946   PCP: Hillery Aldo, NP  Patient is from: Home.  Lives with daughter and grandchild.  Uses cane at baseline.  Works at [employer].  DOA: 09/13/2023 LOS: 2  Chief complaints Chief Complaint  Patient presents with   Leg Swelling     Brief Narrative / Interim history: 78 year old F with PMH of diastolic CHF, PAH, sev TR, mod-sev MR, PAF on Pradaxa, morbid obesity and lower extremity edema presented to ED with worsening DOE and lower extremity edema despite increased dose of Lasix outpatient, and admitted with A-fib with RVR and acute on chronic diastolic CHF.  In ED, in A-fib with RVR with HR to 120s.  Basic labs without significant finding.  Troponin negative.  EKG features A-fib with RVR.  BNP elevated to 360.  No significant finding on CXR.  Patient was started on IV Lasix and Toprol-XL and admitted for further evaluation and management.  Echocardiogram ordered.  Cardiology consulted.  TTE with LVEF of 65 to 70%, indeterminate DD, RVSP of 46, mod LAE, sev RAE, mod MR and sev TR. Thyroid panel suggested significant hyperthyroidism.  Remains on IV Lasix.  Cardiology following.  On home Toprol-XL for A-fib.  Thyroid panel with undetectable TSH and elevated free T4 to 3.28.  Total T3 normal.  Thyroid US with 2.4 cm ill-defined solid nodule in the inferior left thyroid lobe and another smaller nodule.  Started on methimazole.  Subjective: Seen and examined earlier this morning.  No major events overnight of this morning.  No complaints other than some dyspnea with exertion.  Denies chest pain, palpitation, GI or UTI symptoms.  She is by the sink cleaning up.   Objective: Vitals:   09/16/23 0055 09/16/23 0517 09/16/23 0802 09/16/23 0809  BP: 96/71 90/67 110/68 110/68  Pulse: (!) 112 (!) 122 (!) 133 (!) 130  Resp: 18 18 18    Temp: 98.8 F (37.1 C) 98.3 F (36.8 C) (!) 97.3 F (36.3 C)   TempSrc: Oral Oral Oral   SpO2: 98% 96% 93%    Weight:  110.5 kg    Height:        Examination:  GENERAL: No apparent distress.  Nontoxic. HEENT: MMM.  Vision and hearing grossly intact.  NECK: Supple.  No apparent JVD.  No thyromegaly or tenderness. RESP:  No IWOB.  Fair aeration bilaterally. CVS: Irregular rhythm.  HR ranges from 100s to 120s.  Heart sounds normal.  ABD/GI/GU: BS+. Abd soft, NTND.  MSK/EXT:  Moves extremities. No apparent deformity.  2+ BLE edema.  SKIN: no apparent skin lesion or wound NEURO: Awake, alert and oriented appropriately.  No apparent focal neuro deficit. PSYCH: Calm. Normal affect.   Procedures:  None  Microbiology summarized: None  Assessment and plan: Persistent A-fib with RVR: Likely due to heart failure exacerbation.  HR ranges from 100s to 120s.  She is on Toprol-XL 100 mg in the morning and 50 mg in the evening.  On Pradaxa for anticoagulation.   Denies alcohol abuse but admits to social drinking.  Reports compliance with meds.  Thyroid panel suggests primary hypothyroidism with undetectable TSH and free T4 of 3.26 suggesting primary hyperthyroidism.. -Continue home Toprol-XL-100 mg in the morning and 50 mg at night. -Continue Pradaxa for anticoagulation -Diuretics for CHF -Optimize electrolytes -Started methimazole for hyperthyroidism.  Acute on chronic HFpEF: Presents with progressive edema and DOE despite increment of diuretics outpatient.  TTE as above (basically unchanged from prior).  TTE finding  along with lower extremity edema and negative chest x-ray raises concern for right-sided heart failure.  She is on Lasix 60 mg daily with an option to take additional 60 as needed.  Started on IV Lasix.  About 4.3 L UOP/24 hours.  Net -3.6 L.  BP improved.  Cr stable.  -Appreciate cardiology recs-increased IV Lasix to 80 mg twice daily -Continue home Jardiance -Strict intake and output, daily weight, renal functions and electrolytes -PT/OT eval  Severe TR/moderate MR -Per  cardiology.  Primary hyperthyroidism: Thyroid panel suggests primary hypothyroidism with undetectable TSH and free T4 of 3.26. Thyroid US with 2.4 cm ill-defined solid nodule in the inferior left thyroid lobe and another smaller nodule.  Patient denies thyroid supplementation, neck swelling or recent URI.  Not on amiodarone.  No thyromegaly or tenderness on exam. -Discussed with Dr. Sharl Ma with [facility]-recommended checking thyroglobulin antibody and TSI.  He will arrange outpatient follow-up in 2 weeks -Continue Toprol-XL. -Started methimazole  Hypokalemia -Monitor replenish as appropriate  CKD ruled out.  Morbid obesity Body mass index is 44.57 kg/m. -Encourage lifestyle change to lose weight.          DVT prophylaxis:   dabigatran (PRADAXA) capsule 150 mg  Code Status: Full code Family Communication: None at bedside Level of care: Progressive Status is: Inpatient Remains inpatient appropriate because: A-fib with RVR, diastolic CHF exacerbation and primary hyperthyroidism   Final disposition: Home once medically stable Consultants:  Cardiology Endocrinology over the phone  55 minutes with more than 50% spent in reviewing records, counseling patient/family and coordinating care.   Sch Meds:  Scheduled Meds:  dabigatran  150 mg Oral BID   empagliflozin  10 mg Oral QAC breakfast   feeding supplement  237 mL Oral BID BM   furosemide  80 mg Intravenous BID   methIMAzole  10 mg Oral BID   [START ON 09/17/2023] metoprolol succinate  100 mg Oral Daily   And   metoprolol succinate  100 mg Oral QHS   potassium chloride  40 mEq Oral Once   Continuous Infusions: PRN Meds:.metoprolol tartrate  Antimicrobials: Anti-infectives (From admission, onward)    None        I have personally reviewed the following labs and images: CBC: Recent Labs  Lab 09/13/23 2057 09/14/23 1144  WBC 5.6 4.7  NEUTROABS  --  2.8  HGB 13.3 12.8  HCT 41.9 39.5  MCV 96.8 96.1  PLT 194 168    BMP &GFR Recent Labs  Lab 09/13/23 2057 09/14/23 1144 09/15/23 0308 09/16/23 0303  NA 142 141 141 142  K 4.0 4.3 3.4* 3.7  CL 105 106 104 105  CO2 26 20* 27 27  GLUCOSE 111* 213* 131* 134*  BUN 15 16 18 23   CREATININE 0.84 1.02* 1.04* 1.08*  CALCIUM 9.0 8.6* 8.6* 8.8*  MG 2.2 2.1 2.0 2.0   Estimated Creatinine Clearance: 52 mL/min (A) (by C-G formula based on SCr of 1.08 mg/dL (H)). Liver & Pancreas: Recent Labs  Lab 09/14/23 1144  AST 43*  ALT 21  ALKPHOS 97  BILITOT 1.9*  PROT 6.2*  ALBUMIN 3.0*   No results for input(s): "LIPASE", "AMYLASE" in the last 168 hours. No results for input(s): "AMMONIA" in the last 168 hours. Diabetic: No results for input(s): "HGBA1C" in the last 72 hours. No results for input(s): "GLUCAP" in the last 168 hours. Cardiac Enzymes: No results for input(s): "CKTOTAL", "CKMB", "CKMBINDEX", "TROPONINI" in the last 168 hours. No results for input(s): "PROBNP" in  the last 8760 hours. Coagulation Profile: No results for input(s): "INR", "PROTIME" in the last 168 hours. Thyroid Function Tests: Recent Labs    09/14/23 0655 09/14/23 1145  TSH <0.010*  --   FREET4  --  3.26*   Lipid Profile: No results for input(s): "CHOL", "HDL", "LDLCALC", "TRIG", "CHOLHDL", "LDLDIRECT" in the last 72 hours. Anemia Panel: No results for input(s): "VITAMINB12", "FOLATE", "FERRITIN", "TIBC", "IRON", "RETICCTPCT" in the last 72 hours. Urine analysis:    Component Value Date/Time   COLORURINE YELLOW 02/28/2017 1154   APPEARANCEUR CLEAR 02/28/2017 1154   LABSPEC 1.010 02/28/2017 1154   PHURINE 6.0 02/28/2017 1154   GLUCOSEU NEGATIVE 02/28/2017 1154   HGBUR NEGATIVE 02/28/2017 1154   BILIRUBINUR neg 07/27/2018 1426   KETONESUR NEGATIVE 02/28/2017 1154   PROTEINUR Negative 07/27/2018 1426   PROTEINUR NEGATIVE 02/28/2017 1154   UROBILINOGEN 0.2 07/27/2018 1426   UROBILINOGEN 1.0 08/21/2008 1407   NITRITE neg 07/27/2018 1426   NITRITE NEGATIVE  02/28/2017 1154   LEUKOCYTESUR Negative 07/27/2018 1426   Sepsis Labs: Invalid input(s): "PROCALCITONIN", "LACTICIDVEN"  Microbiology: No results found for this or any previous visit (from the past 240 hours).  Radiology Studies: US THYROID Result Date: October 12, 2023 CLINICAL DATA:  Hyperthyroid. EXAM: THYROID ULTRASOUND TECHNIQUE: Ultrasound examination of the thyroid gland and adjacent soft tissues was performed. COMPARISON:  None Available. FINDINGS: Parenchymal Echotexture: Moderately heterogenous Isthmus: 0.2 Right lobe: 4.9 x 2.2 x 3.1 cm Left lobe: 5.4 x 2.3 x 2.8 cm _________________________________________________________ Estimated total number of nodules >/= 1 cm: 1 Number of spongiform nodules >/=  2 cm not described below (TR1): 0 Number of mixed cystic and solid nodules >/= 1.5 cm not described below (TR2): 0 _________________________________________________________ Nodule # 1: Location: Left; Inferior Maximum size: 2.4 cm; Other 2 dimensions: 2.2 x 2.3 cm Composition: solid/almost completely solid (2) Echogenicity: isoechoic (1) Shape: not taller-than-wide (0) Margins: ill-defined (0) Echogenic foci: none (0) ACR TI-RADS total points: 3. ACR TI-RADS risk category: TR3 (3 points). ACR TI-RADS recommendations: *Given size (>/= 1.5 - 2.4 cm) and appearance, a follow-up ultrasound in 1 year should be considered based on TI-RADS criteria. This may be a pseudo nodule based on appearance. _________________________________________________________ Nodule # 2: Location: Left; Mid Maximum size: 0.8 cm; Other 2 dimensions: 0.7 x 0.8 cm Composition: spongiform (0) Echogenicity: anechoic (0) Shape: not taller-than-wide (0) Margins: smooth (0) Echogenic foci: none (0) ACR TI-RADS total points: 0. ACR TI-RADS risk category: TR1 (0-1 points). ACR TI-RADS recommendations: This nodule does NOT meet TI-RADS criteria for biopsy or dedicated follow-up. _________________________________________________________ No  enlarged or abnormal appearing lymph nodes are identified. IMPRESSION: 2.4 cm ill-defined solid nodule in the inferior left thyroid lobe (TI-RADS category 3) meets criteria for 1 year follow-up ultrasound. This may be a pseudo nodule based on appearance. Electronically Signed   By: Irish Lack M.D.   On: Oct 12, 2023 10:16       Joelie Schou T. Carlota Philley [facility]  If 7PM-7AM, please contact night-coverage [email or web address] 10/12/2023, 11:54 AM

## 2023-09-16 NOTE — Plan of Care (Signed)

## 2023-09-16 NOTE — Progress Notes (Signed)
 Rounding Note    Patient Name: Julia Mcfarland Date of Encounter: 09/16/2023  [facility] Cardiologist: Thurmon Fair, MD   Subjective  She did pretty well overnight, no acute issues  Interval hx Weights are improving.had high afib rates overnight; otherwise no issues. SBP 90-100  Inpatient Medications    Scheduled Meds:  dabigatran  150 mg Oral BID   empagliflozin  10 mg Oral QAC breakfast   feeding supplement  237 mL Oral BID BM   furosemide  80 mg Intravenous BID   methIMAzole  10 mg Oral BID   metoprolol succinate  100 mg Oral Daily   And   metoprolol succinate  50 mg Oral QHS   Continuous Infusions:  PRN Meds:    Vital Signs    Vitals:   09/16/23 0055 09/16/23 0517 09/16/23 0802 09/16/23 0809  BP: 96/71 90/67 110/68 110/68  Pulse: (!) 112 (!) 122 (!) 133 (!) 130  Resp: 18 18 18    Temp: 98.8 F (37.1 C) 98.3 F (36.8 C) (!) 97.3 F (36.3 C)   TempSrc: Oral Oral Oral   SpO2: 98% 96% 93%   Weight:  110.5 kg    Height:        Intake/Output Summary (Last 24 hours) at 09/16/2023 1011 Last data filed at 09/16/2023 0800 Gross per 24 hour  Intake 897 ml  Output 4250 ml  Net -3353 ml      09/16/2023    5:17 AM 09/15/2023    6:57 AM 09/14/2023    3:08 PM  Last 3 Weights  Weight (lbs) 243 lb 11.2 oz 252 lb 6.8 oz 263 lb 0.1 oz  Weight (kg) 110.542 kg 114.5 kg 119.3 kg      Telemetry    Atrial fibrillation 1teens-120s - Personally Reviewed  ECG    No new - Personally Reviewed  Physical Exam   Vitals:   09/16/23 0802 09/16/23 0809  BP: 110/68 110/68  Pulse: (!) 133 (!) 130  Resp: 18   Temp: (!) 97.3 F (36.3 C)   SpO2: 93%     Gen: well appearing, not on O2 Neck: + JVD (likely chronic with TR) Neuro: alert and oriented CV: IRRR, murmur not audible Pulm: nl wob, mildly decreased BS Abd: non distended Ext: pitting LE edema, mild erythema, oozing Skin: warm and well perfused Psych: normal mood  Labs    High Sensitivity  Troponin:   Recent Labs  Lab 09/13/23 2057  TROPONINIHS 5     Chemistry Recent Labs  Lab 09/14/23 1144 09/15/23 0308 09/16/23 0303  NA 141 141 142  K 4.3 3.4* 3.7  CL 106 104 105  CO2 20* 27 27  GLUCOSE 213* 131* 134*  BUN 16 18 23   CREATININE 1.02* 1.04* 1.08*  CALCIUM 8.6* 8.6* 8.8*  MG 2.1 2.0 2.0  PROT 6.2*  --   --   ALBUMIN 3.0*  --   --   AST 43*  --   --   ALT 21  --   --   ALKPHOS 97  --   --   BILITOT 1.9*  --   --   GFRNONAA 57* 56* 53*  ANIONGAP 15 10 10     Lipids No results for input(s): "CHOL", "TRIG", "HDL", "LABVLDL", "LDLCALC", "CHOLHDL" in the last 168 hours.  Hematology Recent Labs  Lab 09/13/23 2057 09/14/23 1144  WBC 5.6 4.7  RBC 4.33 4.11  HGB 13.3 12.8  HCT 41.9 39.5  MCV 96.8 96.1  MCH 30.7 31.1  MCHC 31.7 32.4  RDW 14.6 14.8  PLT 194 168   Thyroid  Recent Labs  Lab 09/14/23 0655 09/14/23 1145  TSH <0.010*  --   FREET4  --  3.26*    BNP Recent Labs  Lab 09/13/23 2057  BNP 359.3*    DDimer No results for input(s): "DDIMER" in the last 168 hours.   Radiology    No results found.   Cardiac Studies   TTE 09/14/2023 1. Left ventricular ejection fraction, by estimation, is 65 to 70%. The  left ventricle has normal function. The left ventricle has no regional  wall motion abnormalities. Left ventricular diastolic function could not  be evaluated.   2. Right ventricular systolic function is normal. The right ventricular  size is severely enlarged. There is moderately elevated pulmonary artery  systolic pressure. The estimated right ventricular systolic pressure is  46.1 mmHg.   3. Left atrial size was moderately dilated.   4. Right atrial size was severely dilated.   5. The mitral valve is normal in structure. Moderate mitral valve  regurgitation. No evidence of mitral stenosis.   6. 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   7. The aortic valve is normal in structure. Aortic valve regurgitation is  not visualized. No  aortic stenosis is present.   8. The inferior vena cava is dilated in size with <50% respiratory  variability, suggesting right atrial pressure of 15 mmHg.    Patient Profile     Mrs. Wieman is a 78 year old female patient of Dr. Royann Shivers, who has history of diastolic heart failure, atrial fibrillation, whom we were consulted for decompensated diastolic heart failure.   Assessment & Plan    Diastolic CHF Severe TR she had assessment for cardiac amyloid with severe biatrial enlargement.  She underwent a cardiac MRI which did not show any LGE to suggest amyloid.  LVEF was 52%. she also had moderate atrial functional MR, and low normal RV function.  Prior echo noted severe TR. she is here with decompensated heart failure.  On admission, to me she stated that she needed to sleep slightly propped up on pillows, she has felt overall fatigued, she has significant lower extremity edema with oozing.  Unclear what instigated this decompensation. She was noted to weigh 238 in clinic a few weeks ago and appeared euvolemic. BNP here only mildly elevated. - weight 252, goal closer to 238. Now 243. Net negative 2.8 L, will continue IV 80 mg BID; can continue this plan over the weekend.  Likely can switch to oral on Monday or Tuesday. - Continue jardiance 10 mg daily   Paroxysmal atrial fibrillation Episodes of RVR Which is considered essentially permanent at this point with significant left atrial enlargement. She is relatively rate controlled here, with episodes of RVR. She is asymptomatic from this. She is continued on beta-blocker therapy. She is on Pradaxa for anticoagulation.  Hypothyroidism is contributing -Continue metoprolol succinate 100 mg every AM, 50 mg every PM-->will increase night time dose to and cont to assess BP.  -Can give IV metop PRN for rates > 140 -Continue Pradaxa 150 mg BID   Hyperthyroidism TSH <0.01 On methimazole -per IM  For questions or updates, please contact [facility] Please consult [email or web address] for contact info under        Signed, Maisie Fus, MD  09/16/2023, 10:11 AM

## 2023-09-16 NOTE — Evaluation (Signed)
 Physical Therapy Evaluation Patient Details Name: Julia Mcfarland MRN: [id number] DOB: 1946/03/25 Today's Date: 09/16/2023  History of Present Illness  Pt is a 78 yo female presenting [facility] on [facility]/13/23 with worsening BLE edema and SOB. PMH of CHF, afib, morbid obesity, OA, DJD, L TKA.  Clinical Impression  Pt is a 78 y.o. female presenting on 09/13/23 with above conditions and deficits below, see PT Problem List. PTA pt lived in a one level home with her daughter and grandchild, used a [facility] for ambulation, worked, and performed yard work. Currently pt is mod I for bed mobility, supervision for transfers, and CGA-supervision for gait. Pt displays deficits in transfers, ambulation, dynamic balance, and activity tolerance and would benefit from acute PT to address these impairments. Given the pt's proximity to her baseline and PLOF, no follow-up PT is recommended at this time. Pt would benefit from continued mobility with nurses and mobility specialists until d/c. Will continue to follow acutely.           If plan is discharge home, recommend the following: A little help with walking and/or transfers;A little help with bathing/dressing/bathroom;Assistance with cooking/housework;Assist for transportation;Help with stairs or ramp for entrance   Can travel by private vehicle        Equipment Recommendations Cane  Recommendations for Other Services       Functional Status Assessment Patient has had a recent decline in their functional status and demonstrates the ability to make significant improvements in function in a reasonable and predictable amount of time.     Precautions / Restrictions Precautions Precautions: Fall Recall of Precautions/Restrictions: Intact Restrictions Weight Bearing Restrictions Per Provider Order: No      Mobility  Bed Mobility Overal bed mobility: Modified Independent             General bed mobility comments: Pt requires increased time and HOB elevated     Transfers Overall transfer level: Needs assistance Equipment used: Straight cane Transfers: Sit to/from Stand Sit to Stand: Supervision           General transfer comment: supervision for safety. stands with SPC in R hand    Ambulation/Gait Ambulation/Gait assistance: Contact guard assist, Supervision Gait Distance (Feet): 160 Feet (2 bouts 80 ft each bout, 1 with personal SPC, 2nd with fitted SPC) Assistive device: Straight cane Gait Pattern/deviations: Step-through pattern, Trendelenburg, Wide base of support Gait velocity: reduced Gait velocity interpretation: >2.62 ft/sec, indicative of community ambulatory   General Gait Details: pt walks with bil compensated Trendelenburg R>L. takes short steps with a wide BOS and increased out-toeing. pt reports reduced gait speed s/t being in hospital. CGA for gait with dynamic challenges  Stairs            Wheelchair Mobility     Tilt Bed    Modified Rankin (Stroke Patients Only)       Balance Overall balance assessment: Needs assistance   Sitting balance-Leahy Scale: Good Sitting balance - Comments: pt sits EOB with feet unsupported and supported. No LOB   Standing balance support: Single extremity supported, During functional activity Standing balance-Leahy Scale: Fair Standing balance comment: pt stands with SPC in R hand and increased BOS. No LOB             High level balance activites: Braiding, Turns, Head turns High Level Balance Comments: pt increases BOS during braiding, moderately slows down and more so just looks over her shoulder when asked to turn around, and has greater drifting/staggering with vertical  head turns than horizontal. Pt required CGA             Pertinent Vitals/Pain Pain Assessment Pain Assessment: No/denies pain    Home Living Family/patient expects to be discharged to:: Private residence Living Arrangements: Children;Other relatives (daughter and grandchildren) Available  Help at Discharge: Family;Available PRN/intermittently Type of Home: House Home Access: Stairs to enter Entrance Stairs-Rails: None Entrance Stairs-Number of Steps: 2 at the front door   Home Layout: One level Home Equipment: Shower seat;Cane - single point;Rolling Environmental consultant (2 wheels);BSC/3in1 Additional Comments: works at [employer], does yard work.    Prior Function Prior Level of Function : Working/employed;Driving             Mobility Comments: Mod I using spc ADLs Comments: Ind ADLs/iADLs     Extremity/Trunk Assessment   Upper Extremity Assessment Upper Extremity Assessment: Defer to OT evaluation    Lower Extremity Assessment Lower Extremity Assessment: Generalized weakness    Cervical / Trunk Assessment Cervical / Trunk Assessment: Normal  Communication   Communication Communication: No apparent difficulties    Cognition Arousal: Alert Behavior During Therapy: WFL for tasks assessed/performed   PT - Cognitive impairments: No apparent impairments                         Following commands: Intact       Cueing Cueing Techniques: Verbal cues     General Comments      Exercises     Assessment/Plan    PT Assessment Patient needs continued PT services  PT Problem List Decreased strength;Decreased range of motion;Decreased activity tolerance;Decreased balance;Decreased mobility;Decreased coordination;Decreased knowledge of use of DME;Cardiopulmonary status limiting activity       PT Treatment Interventions DME instruction;Gait training;Stair training;Functional mobility training;Therapeutic activities;Therapeutic exercise;Balance training;Neuromuscular re-education;Patient/family education    PT Goals (Current goals can be found in the Care Plan section)  Acute Rehab PT Goals Patient Stated Goal: return home PT Goal Formulation: With patient Time For Goal Achievement: 09/30/23 Potential to Achieve Goals: Good Additional Goals Additional  Goal #1: Pt will score >19/24 on DGI    Frequency Min 2X/week     Co-evaluation               AM-PAC PT "6 Clicks" Mobility  Outcome Measure Help needed turning from your back to your side while in a flat bed without using bedrails?: None Help needed moving from lying on your back to sitting on the side of a flat bed without using bedrails?: None Help needed moving to and from a bed to a chair (including a wheelchair)?: A Little Help needed standing up from a chair using your arms (e.g., wheelchair or bedside chair)?: A Little Help needed to walk in hospital room?: A Little Help needed climbing 3-5 steps with a railing? : A Little 6 Click Score: 20    End of Session Equipment Utilized During Treatment: Gait belt Activity Tolerance: Patient tolerated treatment well Patient left: in bed;with call bell/phone within reach Nurse Communication: Mobility status;Other (comment) (nurse reported pt doesn't require bed alarm) PT Visit Diagnosis: Unsteadiness on feet (R26.81);Other abnormalities of gait and mobility (R26.89);Muscle weakness (generalized) (M62.81);Difficulty in walking, not elsewhere classified (R26.2)    Time: 5621-3086 PT Time Calculation (min) (ACUTE ONLY): 25 min   Charges:   PT Evaluation $PT Eval Low Complexity: 1 Low PT Treatments $Therapeutic Activity: 8-22 mins PT General Charges $$ ACUTE PT VISIT: 1 Visit  [address] [place], SPT   [place]ion Diesel 09/16/2023, 6:12 PM

## 2023-09-17 DIAGNOSIS — I5033 Acute on chronic diastolic (congestive) heart failure: Secondary | ICD-10-CM | POA: Diagnosis not present

## 2023-09-17 LAB — MAGNESIUM: Magnesium: 2 mg/dL (ref 1.7–2.4)

## 2023-09-17 LAB — BASIC METABOLIC PANEL
Anion gap: 10 (ref 5–15)
BUN: 23 mg/dL (ref 8–23)
CO2: 30 mmol/L (ref 22–32)
Calcium: 8.9 mg/dL (ref 8.9–10.3)
Chloride: 103 mmol/L (ref 98–111)
Creatinine, Ser: 1 mg/dL (ref 0.44–1.00)
GFR, Estimated: 58 mL/min — ABNORMAL LOW (ref >60)
Glucose, Bld: 111 mg/dL — ABNORMAL HIGH (ref 70–99)
Potassium: 3.6 mmol/L (ref 3.5–5.1)
Sodium: 143 mmol/L (ref 135–145)

## 2023-09-17 MED ORDER — POTASSIUM CHLORIDE CRYS ER 20 MEQ PO TBCR
40.0000 meq | EXTENDED_RELEASE_TABLET | Freq: Once | ORAL | Status: AC
  Administered 2023-09-17: 40 meq via ORAL
  Filled 2023-09-17: qty 2

## 2023-09-17 NOTE — Progress Notes (Signed)
 Mobility Specialist Progress Note:   09/17/23 1430  Mobility  Activity Ambulated with assistance in hallway  Level of Assistance Standby assist, set-up cues, supervision of patient - no hands on  Assistive Device Cane  Distance Ambulated (ft) 250 ft  Activity Response Tolerated well  Mobility Referral Yes  Mobility visit 1 Mobility  Mobility Specialist Start Time (ACUTE ONLY) 1430  Mobility Specialist Stop Time (ACUTE ONLY) 1450  Mobility Specialist Time Calculation (min) (ACUTE ONLY) 20 min   Pt agreeable to mobility session. Required no physical assistance to ambulate with cane. No unsteadiness noted. HR 110s-120s with ambulation. Denies SOB throughout. Pt back in chair with all needs met.   Addison Lank Mobility Specialist Please contact via SecureChat or  Rehab office at [phone]

## 2023-09-17 NOTE — Plan of Care (Signed)
 See note 3/15 for recommendations. She has tolerated increase in BB. Recommend diuresis until she is closer to 238 pounds. Likely can switch to PO by Tuesday

## 2023-09-17 NOTE — Plan of Care (Signed)

## 2023-09-17 NOTE — Progress Notes (Signed)
 Mobility Specialist Progress Note:   09/17/23 0945  Mobility  Activity Ambulated independently in room  Level of Assistance Modified independent, requires aide device or extra time  Assistive Device None  Distance Ambulated (ft) 90 ft  Activity Response Tolerated well  Mobility Referral Yes  Mobility visit 1 Mobility  Mobility Specialist Start Time (ACUTE ONLY) 0945  Mobility Specialist Stop Time (ACUTE ONLY) 1000  Mobility Specialist Time Calculation (min) (ACUTE ONLY) 15 min   Pt agreeable to mobility session. Ambulated with no assistance. HR remains irregular and high (110s-140s). Endorsed minor SOB, however SpO2 97% on RA. Pt left with all needs met, encouraged frequent ambulation.   Addison Lank Mobility Specialist Please contact via SecureChat or  Rehab office at [phone]

## 2023-09-17 NOTE — Progress Notes (Signed)
 PROGRESS NOTE    Julia Mcfarland  WGN:[id number] DOB: 1945-10-20 DOA: 09/13/2023 PCP: Hillery Aldo, NP     Brief Narrative:  78 year old F with PMH of diastolic CHF, PAH, sev TR, mod-sev MR, PAF on Pradaxa, morbid obesity and lower extremity edema presented to ED with worsening DOE and lower extremity edema despite increased dose of Lasix outpatient, and admitted with A-fib with RVR and acute on chronic diastolic CHF.  In ED, in A-fib with RVR with HR to 120s.  Basic labs without significant finding.  Troponin negative.  EKG features A-fib with RVR.  BNP elevated to 360.  No significant finding on CXR.  Patient was started on IV Lasix and Toprol-XL and admitted for further evaluation and management.  Echocardiogram ordered.  Cardiology consulted.   TTE with LVEF of 65 to 70%, indeterminate DD, RVSP of 46, mod LAE, sev RAE, mod MR and sev TR. Thyroid panel suggested significant hyperthyroidism.  Remains on IV Lasix.  Cardiology following.  On home Toprol-XL for A-fib.   Thyroid panel with undetectable TSH and elevated free T4 to 3.28.  Total T3 normal.  Thyroid US with 2.4 cm ill-defined solid nodule in the inferior left thyroid lobe and another smaller nodule.  Started on methimazole.   New events last 24 hours / Subjective: Still having shortness of breath with exertion but this is improving.  Swelling improving.  Interested to work with physical therapy.  Assessment & Plan:   Persistent A-fib with RVR: -Continue home Toprol-XL-100 mg in the morning and 50 mg at night. -Continue Pradaxa 150 mg/d -Lasix as below -Optimize electrolytes -IV metoprolol 5 mg as needed -Telemetry   Acute on chronic HFpEF:  -Appreciate cardiology recs: IV Lasix to 80 mg twice daily -Continue home Jardiance 10 mg daily -Strict intake and output, daily weight, renal functions and electrolytes -PT/OT eval   Severe TR/moderate MR -Per cardiology.   Primary hyperthyroidism:  -Previous rounder discussed  with Dr. Sharl Ma with [facility]-recommended checking thyroglobulin antibody and TSI.  He will arrange outpatient follow-up in 2 weeks -Continue Toprol-XL. -Started methimazole 10 mg twice daily   Hypokalemia -Monitor replenish as appropriate   Morbid obesity Body mass index is 44.57 kg/m. -Encourage lifestyle change to lose weight.   DVT prophylaxis: Pradaxa Code Status: Full Family Communication: None bedside Coming From: Home Disposition Plan: Pending Barriers to Discharge: Clinical improvement  Consultants:  Cardiology  Procedures:  None  Objective: Vitals:   09/17/23 0437 09/17/23 0735 09/17/23 1047 09/17/23 1048  BP: 91/71 111/87  101/71  Pulse: (!) 101 (!) 119  90  Resp: 17 18  18   Temp: 98.4 F (36.9 C) 98 F (36.7 C) (!) 94.8 F (34.9 C) (!) 97.5 F (36.4 C)  TempSrc: Oral Oral  Axillary  SpO2: 95% 95%    Weight: 112.2 kg     Height:        Intake/Output Summary (Last 24 hours) at 09/17/2023 1341 Last data filed at 09/17/2023 1233 Gross per 24 hour  Intake 1788 ml  Output 3200 ml  Net -1412 ml   Filed Weights   09/15/23 0657 09/16/23 0517 09/17/23 0437  Weight: 114.5 kg 110.5 kg 112.2 kg    Examination:  General exam: Appears calm and comfortable  Respiratory system: Clear to auscultation. Respiratory effort normal. No respiratory distress. No conversational dyspnea.  Cardiovascular system: S1 & S2 heard, tachycardic, irregularly irregular rhythm. No murmurs. 3+ pitting bilateral lower extremity edema tapering at the knees. Central nervous system: Alert and  oriented. No focal neurological deficits. Speech clear.  Extremities: No calf TTP Skin: No rashes, lesions or ulcers on exposed skin  Psychiatry: Judgement and insight appear normal. Mood & affect appropriate.   Data Reviewed: I have personally reviewed following labs and imaging studies  CBC: Recent Labs  Lab 09/13/23 2057 09/14/23 1144  WBC 5.6 4.7  NEUTROABS  --  2.8  HGB 13.3 12.8  HCT  41.9 39.5  MCV 96.8 96.1  PLT 194 168   Basic Metabolic Panel: Recent Labs  Lab 09/13/23 2057 09/14/23 1144 09/15/23 0308 2023/10/12 0303 09/17/23 0247  NA 142 141 141 142 143  K 4.0 4.3 3.4* 3.7 3.6  CL 105 106 104 105 103  CO2 26 20* 27 27 30   GLUCOSE 111* 213* 131* 134* 111*  BUN 15 16 18 23 23   CREATININE 0.84 1.02* 1.04* 1.08* 1.00  CALCIUM 9.0 8.6* 8.6* 8.8* 8.9  MG 2.2 2.1 2.0 2.0 2.0   GFR: Estimated Creatinine Clearance: 56.6 mL/min (by C-G formula based on SCr of 1 mg/dL). Liver Function Tests: Recent Labs  Lab 09/14/23 1144  AST 43*  ALT 21  ALKPHOS 97  BILITOT 1.9*  PROT 6.2*  ALBUMIN 3.0*   Radiology Studies: US THYROID Result Date: 10-12-23 CLINICAL DATA:  Hyperthyroid. EXAM: THYROID ULTRASOUND TECHNIQUE: Ultrasound examination of the thyroid gland and adjacent soft tissues was performed. COMPARISON:  None Available. FINDINGS: Parenchymal Echotexture: Moderately heterogenous Isthmus: 0.2 Right lobe: 4.9 x 2.2 x 3.1 cm Left lobe: 5.4 x 2.3 x 2.8 cm _________________________________________________________ Estimated total number of nodules >/= 1 cm: 1 Number of spongiform nodules >/=  2 cm not described below (TR1): 0 Number of mixed cystic and solid nodules >/= 1.5 cm not described below (TR2): 0 _________________________________________________________ Nodule # 1: Location: Left; Inferior Maximum size: 2.4 cm; Other 2 dimensions: 2.2 x 2.3 cm Composition: solid/almost completely solid (2) Echogenicity: isoechoic (1) Shape: not taller-than-wide (0) Margins: ill-defined (0) Echogenic foci: none (0) ACR TI-RADS total points: 3. ACR TI-RADS risk category: TR3 (3 points). ACR TI-RADS recommendations: *Given size (>/= 1.5 - 2.4 cm) and appearance, a follow-up ultrasound in 1 year should be considered based on TI-RADS criteria. This may be a pseudo nodule based on appearance. _________________________________________________________ Nodule # 2: Location: Left; Mid Maximum  size: 0.8 cm; Other 2 dimensions: 0.7 x 0.8 cm Composition: spongiform (0) Echogenicity: anechoic (0) Shape: not taller-than-wide (0) Margins: smooth (0) Echogenic foci: none (0) ACR TI-RADS total points: 0. ACR TI-RADS risk category: TR1 (0-1 points). ACR TI-RADS recommendations: This nodule does NOT meet TI-RADS criteria for biopsy or dedicated follow-up. _________________________________________________________ No enlarged or abnormal appearing lymph nodes are identified. IMPRESSION: 2.4 cm ill-defined solid nodule in the inferior left thyroid lobe (TI-RADS category 3) meets criteria for 1 year follow-up ultrasound. This may be a pseudo nodule based on appearance. Electronically Signed   By: Irish Lack M.D.   On: 10-12-2023 10:16     Scheduled Meds:  dabigatran  150 mg Oral BID   empagliflozin  10 mg Oral QAC breakfast   feeding supplement  237 mL Oral BID BM   furosemide  80 mg Intravenous BID   methIMAzole  10 mg Oral BID   metoprolol succinate  100 mg Oral Daily   And   metoprolol succinate  100 mg Oral QHS   potassium chloride  40 mEq Oral Once   Continuous Infusions:   LOS: 3 days    Time spent: 35 minutes   Janyth Pupa  Carren Rang, DO [facility] 09/17/2023, 1:41 PM   Available via Epic secure chat 7am-7pm After these hours, please refer to coverage provider listed on [email or web address]

## 2023-09-18 DIAGNOSIS — I5033 Acute on chronic diastolic (congestive) heart failure: Secondary | ICD-10-CM | POA: Diagnosis not present

## 2023-09-18 LAB — BASIC METABOLIC PANEL
Anion gap: 9 (ref 5–15)
BUN: 24 mg/dL — ABNORMAL HIGH (ref 8–23)
CO2: 30 mmol/L (ref 22–32)
Calcium: 8.6 mg/dL — ABNORMAL LOW (ref 8.9–10.3)
Chloride: 101 mmol/L (ref 98–111)
Creatinine, Ser: 1.07 mg/dL — ABNORMAL HIGH (ref 0.44–1.00)
GFR, Estimated: 54 mL/min — ABNORMAL LOW (ref >60)
Glucose, Bld: 117 mg/dL — ABNORMAL HIGH (ref 70–99)
Potassium: 3.6 mmol/L (ref 3.5–5.1)
Sodium: 140 mmol/L (ref 135–145)

## 2023-09-18 LAB — CBC
HCT: 38.2 % (ref 36.0–46.0)
Hemoglobin: 12.5 g/dL (ref 12.0–15.0)
MCH: 30.4 pg (ref 26.0–34.0)
MCHC: 32.7 g/dL (ref 30.0–36.0)
MCV: 92.9 fL (ref 80.0–100.0)
Platelets: 176 10*3/uL (ref 150–400)
RBC: 4.11 MIL/uL (ref 3.87–5.11)
RDW: 14.2 % (ref 11.5–15.5)
WBC: 4.4 10*3/uL (ref 4.0–10.5)
nRBC: 0 % (ref 0.0–0.2)

## 2023-09-18 LAB — THYROID STIMULATING IMMUNOGLOBULIN: Thyroid Stimulating Immunoglob: 1.6 IU/L — ABNORMAL HIGH (ref 0.00–0.55)

## 2023-09-18 LAB — MAGNESIUM: Magnesium: 2 mg/dL (ref 1.7–2.4)

## 2023-09-18 LAB — THYROGLOBULIN ANTIBODY: Thyroglobulin Antibody: <1 [IU]/mL (ref 0.0–0.9)

## 2023-09-18 MED ORDER — POTASSIUM CHLORIDE CRYS ER 20 MEQ PO TBCR
40.0000 meq | EXTENDED_RELEASE_TABLET | Freq: Every day | ORAL | Status: DC
  Administered 2023-09-18 – 2023-09-21 (×4): 40 meq via ORAL
  Filled 2023-09-18 (×4): qty 2

## 2023-09-18 MED ORDER — FUROSEMIDE 10 MG/ML IJ SOLN
100.0000 mg | Freq: Two times a day (BID) | INTRAVENOUS | Status: DC
  Administered 2023-09-18 – 2023-09-21 (×5): 100 mg via INTRAVENOUS
  Filled 2023-09-18 (×9): qty 10

## 2023-09-18 NOTE — Progress Notes (Signed)
 Orthopedic Tech Progress Note Patient Details:  LIZANDRA ZAKRZEWSKI [birth year] [id number]  Ortho Devices Type of Ortho Device: Ace wrap, Unna boot Ortho Device/Splint Location: BLE Ortho Device/Splint Interventions: Ordered, Application, Adjustment   Post Interventions Patient Tolerated: Well Instructions Provided: Care of device  Donald Pore 09/18/2023, 3:32 PM

## 2023-09-18 NOTE — Progress Notes (Addendum)
 Progress Note  Patient Name: Julia Mcfarland Date of Encounter: 09/18/2023 Primary Cardiologist: Thurmon Fair, MD   Subjective   Over the weekend, continued to diurese. Patient notes legs were weeping yesterday.  This has improved. No CP, SOB, Palpitations.  Vital Signs    Vitals:   09/17/23 2347 09/18/23 0335 09/18/23 0341 09/18/23 0714  BP: (!) 101/59  104/73   Pulse: 97   97  Resp: 18  18 18   Temp: 98.1 F (36.7 C)  98.2 F (36.8 C) 98.2 F (36.8 C)  TempSrc: Oral  Oral Oral  SpO2: 98%  96%   Weight:  110.9 kg    Height:        Intake/Output Summary (Last 24 hours) at 09/18/2023 0747 Last data filed at 09/18/2023 0300 Gross per 24 hour  Intake 1194 ml  Output 1900 ml  Net -706 ml   Filed Weights   09/16/23 0517 09/17/23 0437 09/18/23 0335  Weight: 110.5 kg 112.2 kg 110.9 kg    Physical Exam   GEN: No acute distress.   Neck: JVD Cardiac: iRRR, holosystolic murmur with no rubs, or gallops.  Respiratory: Clear to auscultation bilaterally. GI: Soft, nontender, non-distended  MS: +2 edema  Labs   Telemetry: AF - rates ~ 105 bpm   Chemistry Recent Labs  Lab 09/14/23 1144 09/15/23 0308 09/16/23 0303 09/17/23 0247 09/18/23 0331  NA 141   < > 142 143 140  K 4.3   < > 3.7 3.6 3.6  CL 106   < > 105 103 101  CO2 20*   < > 27 30 30   GLUCOSE 213*   < > 134* 111* 117*  BUN 16   < > 23 23 24*  CREATININE 1.02*   < > 1.08* 1.00 1.07*  CALCIUM 8.6*   < > 8.8* 8.9 8.6*  PROT 6.2*  --   --   --   --   ALBUMIN 3.0*  --   --   --   --   AST 43*  --   --   --   --   ALT 21  --   --   --   --   ALKPHOS 97  --   --   --   --   BILITOT 1.9*  --   --   --   --   GFRNONAA 57*   < > 53* 58* 54*  ANIONGAP 15   < > 10 10 9    < > = values in this interval not displayed.     Hematology Recent Labs  Lab 09/13/23 2057 09/14/23 1144 09/18/23 0331  WBC 5.6 4.7 4.4  RBC 4.33 4.11 4.11  HGB 13.3 12.8 12.5  HCT 41.9 39.5 38.2  MCV 96.8 96.1 92.9  MCH 30.7 31.1  30.4  MCHC 31.7 32.4 32.7  RDW 14.6 14.8 14.2  PLT 194 168 176    BNP Recent Labs  Lab 09/13/23 2057  BNP 359.3*      Cardiac Studies   Cardiac Studies & Procedures   ______________________________________________________________________________________________     ECHOCARDIOGRAM  ECHOCARDIOGRAM COMPLETE 09/14/2023  Narrative ECHOCARDIOGRAM REPORT    Patient Name:   Julia Mcfarland Date of Exam: 09/14/2023 Medical Rec #:  [id number]      Height:       62.0 in Accession #:    [id number]     Weight:       238.1 lb Date of Birth:  1945-11-07  BSA:          2.059 m Patient Age:    78 years       BP:           99/67 mmHg Patient Gender: F              HR:           123 bpm. Exam Location:  Inpatient  Procedure: 2D Echo, Color Doppler and Cardiac Doppler (Both Spectral and Color Flow Doppler were utilized during procedure).  Indications:     CHF  History:         Patient has prior history of Echocardiogram examinations. CHF; Arrythmias:Atrial Fibrillation.  Sonographer:     Lamont Snowball Referring Phys:  [id number] Eduard Clos Diagnosing Phys: Armanda Magic MD  IMPRESSIONS   1. Left ventricular ejection fraction, by estimation, is 65 to 70%. The left ventricle has normal function. The left ventricle has no regional wall motion abnormalities. Left ventricular diastolic function could not be evaluated. 2. Right ventricular systolic function is normal. The right ventricular size is severely enlarged. There is moderately elevated pulmonary artery systolic pressure. The estimated right ventricular systolic pressure is 46.1 mmHg. 3. Left atrial size was moderately dilated. 4. Right atrial size was severely dilated. 5. The mitral valve is normal in structure. Moderate mitral valve regurgitation. No evidence of mitral stenosis. 6. 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 7. The aortic valve is normal in structure. Aortic valve regurgitation is not visualized. No aortic  stenosis is present. 8. The inferior vena cava is dilated in size with <50% respiratory variability, suggesting right atrial pressure of 15 mmHg.  FINDINGS Left Ventricle: Left ventricular ejection fraction, by estimation, is 65 to 70%. The left ventricle has normal function. The left ventricle has no regional wall motion abnormalities. The left ventricular internal cavity size was normal in size. There is no left ventricular hypertrophy. Left ventricular diastolic function could not be evaluated due to atrial fibrillation. Left ventricular diastolic function could not be evaluated.  Right Ventricle: The right ventricular size is severely enlarged. No increase in right ventricular wall thickness. Right ventricular systolic function is normal. There is moderately elevated pulmonary artery systolic pressure. The tricuspid regurgitant velocity is 2.79 m/s, and with an assumed right atrial pressure of 15 mmHg, the estimated right ventricular systolic pressure is 46.1 mmHg.  Left Atrium: Left atrial size was moderately dilated.  Right Atrium: Right atrial size was severely dilated.  Pericardium: There is no evidence of pericardial effusion.  Mitral Valve: The mitral valve is normal in structure. Mild mitral annular calcification. Moderate mitral valve regurgitation, with eccentric posteriorly directed jet. No evidence of mitral valve stenosis. MV peak gradient, 6.2 mmHg. The mean mitral valve gradient is 3.3 mmHg.  Tricuspid Valve: The tricuspid valve is normal in structure. 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 No evidence of tricuspid stenosis.  Aortic Valve: The aortic valve is normal in structure. Aortic valve regurgitation is not visualized. No aortic stenosis is present. Aortic valve peak gradient measures 13.7 mmHg.  Pulmonic Valve: The pulmonic valve was normal in structure. Pulmonic valve regurgitation is not visualized. No evidence of pulmonic stenosis.  Aorta: The aortic root is  normal in size and structure.  Venous: The inferior vena cava is dilated in size with less than 50% respiratory variability, suggesting right atrial pressure of 15 mmHg.  IAS/Shunts: No atrial level shunt detected by color flow Doppler.   LEFT VENTRICLE PLAX 2D LVIDd:  4.00 cm LVIDs:         3.10 cm LV PW:         0.90 cm LV IVS:        0.70 cm LVOT diam:     2.10 cm LV SV:         67 LV SV Index:   33 LVOT Area:     3.46 cm   RIGHT VENTRICLE          IVC RV Basal diam:  5.60 cm  IVC diam: 2.90 cm  LEFT ATRIUM             Index        RIGHT ATRIUM           Index LA Vol (A2C):   94.5 ml 45.90 ml/m  RA Area:     26.50 cm LA Vol (A4C):   76.5 ml 37.16 ml/m  RA Volume:   88.90 ml  43.18 ml/m LA Biplane Vol: 91.8 ml 44.59 ml/m AORTIC VALVE AV Area (Vmax): 2.92 cm AV Vmax:        185.33 cm/s AV Peak Grad:   13.7 mmHg LVOT Vmax:      156.25 cm/s LVOT Vmean:     104.850 cm/s LVOT VTI:       0.194 m  AORTA Ao Root diam: 2.90 cm Ao Asc diam:  3.20 cm  MITRAL VALVE                TRICUSPID VALVE MV Area (PHT): 5.51 cm     TR Peak grad:   31.1 mmHg MV Area VTI:   3.84 cm     TR Vmax:        279.00 cm/s MV Peak grad:  6.2 mmHg MV Mean grad:  3.3 mmHg     SHUNTS MV Vmax:       1.25 m/s     Systemic VTI:  0.19 m MV Vmean:      84.1 cm/s    Systemic Diam: 2.10 cm MV Decel Time: 138 msec MR Peak grad: 97.6 mmHg MR Vmax:      494.00 cm/s MV E velocity: 105.67 cm/s  Armanda Magic MD Electronically signed by Armanda Magic MD Signature Date/Time: 09/14/2023/10:48:47 AM    Final (Updated)    MONITORS  LONG TERM MONITOR (3-14 DAYS) 03/11/2020  Narrative  The dominant rhythm is normal sinus rhythm with normal circadian variation.  There are no episodes of significant bradycardia.  Episode of paroxysmal atrial fibrillation lasting more than 2 days was recorded, with poor ventricular rate control (average ventricular rate of 136 bpm)  Also seen are very brief  episodes of nonsustained atrial tachycardia and a single 6 beat episode of nonsustained ventricular tachycardia.  Abnormal event monitor due to the presence of paroxysmal atrial fibrillation with rapid ventricular response.     CARDIAC MRI  MR CARDIAC MORPHOLOGY W WO CONTRAST 11/16/2022  Narrative CLINICAL DATA:  R/O Amyloid  EXAM: CARDIAC MRI  TECHNIQUE: The patient was scanned on a 1.5 Tesla Siemens magnet. A dedicated cardiac coil was used. Functional imaging was done using Fiesta sequences. 2,3, and 4 chamber views were done to assess for RWMA's. Modified Simpson's rule using a short axis stack was used to calculate an ejection fraction on a dedicated work [employer]. The patient received 10 cc of Gadavist. After 10 minutes inversion recovery sequences were used to assess for infiltration and scar tissue.  CONTRAST:  [id number]  FINDINGS: Severe  bi atrial enlargement No LAA thrombus. No ASD/PFO No pericardial effusion Normal IVC diameter 1.6 cm Normal ascending thoracic aorta 3.4 cm. Tri leaflet AV Atrial functional MR appears at least moderate on true FISP images Normal TV/PV.  Normal LV size and function Quantitative EF 52% (EDV 155 ESV 74 SV 81 cc )  Mildly dilated RV with low normal function RVEF 48% (EDV 210 cc ESV 109 cc SV 101 cc )  Estimated cardiac output based on flow velocity through the aortic valve annulus 6.3 L/min  Delayed enhancement images with gadolinium showed normal nulling and no uptake No evidence of amyloid  Parametric measures  T1 1007 msec normal  ECV 34% normal  T2 44 msec normal  IMPRESSION: 1.  Severe bi atrial enlargement no LAA thrombus  2.  Normal LV size and function LVEF 52%  3.  Mild RVE with low normal function RVEF 48%  4. Delayed enhancement images with normal nulling and no gadolinium uptake No evidence of amyloid  5.  Moderate appearing atrial functional MR  6.  Normal parametric measures  7.   Normal cardiac output 6.3 L/min  Charlton Haws   Electronically Signed By: Charlton Haws M.D. On: 11/16/2022 15:42  PYP SCAN  MYOCARDIAL AMYLOID PLANAR AND SPECT 08/24/2022  Narrative   By semi-quantitative assessment scan is consistent with increased heart uptake but less than rib uptake-Grade 1. Heart to contralateral lung ratio is between 1-1.5, indeterminate for amyloid.   Study is equivocal for TTR amyloidosis (visual score of 1/ratio between 1-1.5).   Prior study not available for comparison.  Equivocal study for TTR amyloid.  Cardiac MRI recommended  ______________________________________________________________________________________________           Assessment & Plan   Acute on Chronic HF (preserved EF) - still volume overloaded; issues with low K keeps [place] from being more aggressive with diuretics - NYHA II, Stage C - lasix to 100 mg  - adding UNNA boot today - continue SGLT2i  Severe TR Moderate MR - outpatient f/u echo once medically managed (~ 6 months limited) as she would be a candidate for tTEER  PAF - on highest tolerated BB (BP) - if symptomatic RVR will start amiodarone 400 mg PO BID X 10 days  Hypokalemia - adding K and increasing lasix  Morbid Obesity - weight control will improve comorbidity control  As per primary/non-cardiac consultants - hyperthyroidism- likely a driver of RVR  For questions or updates, please contact [facility] Please consult [email or web address] for contact info under Cardiology/STEMI.      Riley Lam, MD FASE [facility] Cardiologist [facility]  [address] [place], [address] [place], [place] [address] [phone]  7:47 AM

## 2023-09-18 NOTE — Plan of Care (Signed)

## 2023-09-18 NOTE — Progress Notes (Signed)
 Chaplain responded to a consult request for Advance Directive education. Ms Enck shared that her husband died of Covid in 10-03-19. She currently lives in her home with her daughter and granddaughter. She finds great value in her family and delights in keeping her 8 and 77 year old great grandchildren every Friday night. She shared that she is here getting some minor things sorted out, but acknowledges that none of [place] are guaranteed tomorrow and is thinking more and more about needing to make some plans. She spoke about her home almost being paid off and her desires to keep that home as a resource for her family as things grow more expensive. Chaplain shared that these documents do not cover financial matters, but are often a component of estate planning. Chaplain acknowledged the difficulty of those decisions and the desire to keep hard earned assets in the family and encouraged pt to seek greater consult for those matters.   Chaplain provided the Advance Directive packet as well as education on Advance Directives-documents an individual completes to communicate their health care directions in advance of a time when they may need them. Chaplain informed pt the documents which may be completed here in the hospital are the Living Will and Health Care Power of [place].   Chaplain informed that the Health Care Power of [place] is a legal document in which an individual names another person, their Health Care Agent, to make health care decisions when the individual is not able to make them for themselves. The Health Care Agent's function can be temporary or permanent depending on the pt's ability to make and communicate those decisions independently. Chaplain informed pt in the absence of a Health Care Power of [place], the state of [place] directs health care providers to look to the following individuals in the order listed: legal guardian; an attorney?in?fact under a general power of attorney (POA) if that  POA includes the right to make health care decisions; a husband or wife; a majority of parents and adult children; a majority of adult brothers and sisters; or an individual who has an established relationship with you, who is acting in good faith and who can convey your wishes.  If none of these person are available or willing to make medical decisions on a patient's behalf, the law allows the patient's doctor to make decisions for them as long as another doctor agrees with those decisions.  Chaplain also informed the patient that the Health Care agent has no decision-making authority over any affairs other than those related to his or her medical care.   The chaplain further educated the pt that a Living Will is a legal document that allows an individual to state his or her desire not to receive life-prolonging measures in the event that they have a condition that is incurable and will result in their death in a short period of time; they are unconscious, and doctors are confident that they will not regain consciousness; and/or they have advanced dementia or other substantial and irreversible loss of mental function. The chaplain informed pt that life-prolonging measures are medical treatments that would only serve to postpone death, including breathing machines, kidney dialysis, antibiotics, artificial nutrition and hydration (tube feeding), and similar forms of treatment and that if an individual is able to express their wishes, they may also make them known without the use of a Living Will, but in the event that an individual is not able to express their wishes themselves, a Living Will allows  medical providers and the pt's family and friends ensure that they are not making decisions on the pt's behalf, but rather serving as the pt's voice to convey decisions the pt has already made.   The patient is aware that the decision to create an advance directive is theirs alone and they may chose not to complete  the documents or may chose to complete one portion or both.  The patient was informed that they can revoke the documents at any time by striking through them and writing void or by completing new documents, but that it is also advisable that the individual verbally notify interested parties that their wishes have changed.  They are also aware that the document must be signed in the presence of a notary public and two witnesses and that this can be done while the patient is still admitted to the hospital or after discharge in the community. If they decide to complete Advance Directives after being discharged from the hospital, they have been advised to notify all interested parties and to provide those documents to their physicians and loved ones in addition to bringing them to the hospital in the event of another hospitalization.   The chaplain informed the pt that if they desire to proceed with completing Advance Directive Documentation while they are still admitted, notary services are typically available at [facility] between the hours of 1:00 and 3:30 Monday-Thursday.    When the patient is ready to have these documents completed, the patient should request that their nurse place a spiritual care consult and indicate that the patient is ready to have their advance directives notarized so that arrangements for witnesses and notary public can be made.  Please page spiritual care if the patient desires further education or has questions.     Maryanna Shape. Carley Hammed, M.Div. [facility] Chaplain Pager [phone] Office [phone]      09/18/23 1042  Spiritual Encounters  Type of Visit Initial  Care provided to: Patient  Referral source Patient request  Reason for visit Advance directives  Advance Directives (For Healthcare)  Does Patient Have a Medical Advance Directive? No  Would patient like information on creating a medical advance directive? Yes (Inpatient - patient defers creating a  medical advance directive at this time - Information given)

## 2023-09-18 NOTE — Progress Notes (Signed)
 PROGRESS NOTE    Julia Mcfarland  NFA:[id number] DOB: 06-16-1946 DOA: 09/13/2023 PCP: Hillery Aldo, NP     Brief Narrative:  78 year old F with PMH of diastolic CHF, PAH, sev TR, mod-sev MR, PAF on Pradaxa, morbid obesity and lower extremity edema presented to ED with worsening DOE and lower extremity edema despite increased dose of Lasix outpatient, and admitted with A-fib with RVR and acute on chronic diastolic CHF.  In ED, in A-fib with RVR with HR to 120s.  Basic labs without significant finding.  Troponin negative.  EKG features A-fib with RVR.  BNP elevated to 360.  No significant finding on CXR.  Patient was started on IV Lasix and Toprol-XL and admitted for further evaluation and management.  Echocardiogram ordered.  Cardiology consulted.   TTE with LVEF of 65 to 70%, indeterminate DD, RVSP of 46, mod LAE, sev RAE, mod MR and sev TR. Thyroid panel suggested significant hyperthyroidism.  Remains on IV Lasix.  Cardiology following.  On home Toprol-XL for A-fib.   Thyroid panel with undetectable TSH and elevated free T4 to 3.28.  Total T3 normal.  Thyroid US with 2.4 cm ill-defined solid nodule in the inferior left thyroid lobe and another smaller nodule.  Started on methimazole.   New events last 24 hours / Subjective: Patient states she is feeling much better. She has been walking around her room. She complains of weeping of her legs at night.  Assessment & Plan:   Persistent A-fib with RVR: -Continue home Toprol-XL-100 mg in the morning and 50 mg at night. -Continue Pradaxa 150 mg/d -Lasix as below -Optimize electrolytes -IV metoprolol 5 mg as needed -Telemetry   Acute on chronic HFpEF:  -Appreciate cardiology recs: IV Lasix to 80 mg twice daily.  Continue to diurese with goal weight 238 pounds. -Continue home Jardiance 10 mg daily -Strict intake and output, daily weight, renal functions and electrolytes -PT/OT eval  Severe TR/moderate MR -Per cardiology.   Primary  hyperthyroidism:  -Previous rounder discussed with Dr. Sharl Ma with [facility]-recommended checking thyroglobulin antibody and TSI.  He will arrange outpatient follow-up in 2 weeks -Continue Toprol-XL. -Started methimazole 10 mg twice daily   Hypokalemia -Monitor replenish as appropriate   Morbid obesity Body mass index is 44.57 kg/m. -Encourage lifestyle change to lose weight.   DVT prophylaxis: Pradaxa Code Status: Full Family Communication: N/a Coming From: Home Disposition Plan: Pending Barriers to Discharge: Clinical improvement  Consultants:  Cardiology  Procedures:  None  Objective: Vitals:   09/18/23 0341 09/18/23 0714 09/18/23 1135 09/18/23 1608  BP: 104/73   109/79  Pulse:  97 99 99  Resp: 18 18 20 20   Temp: 98.2 F (36.8 C) 98.2 F (36.8 C) 97.9 F (36.6 C) 98.4 F (36.9 C)  TempSrc: Oral Oral Oral Oral  SpO2: 96%   98%  Weight:      Height:        Intake/Output Summary (Last 24 hours) at 09/18/2023 1657 Last data filed at 09/18/2023 1610 Gross per 24 hour  Intake 948 ml  Output 2960 ml  Net -2012 ml   Filed Weights   09/16/23 0517 09/17/23 0437 09/18/23 0335  Weight: 110.5 kg 112.2 kg 110.9 kg    Physical Examination:   General: Alert, oriented X3  Eyes: Pupils equal, reactive  Oral cavity: moist mucous membranes  Head: Atraumatic, normocephalic  Neck: supple  Chest: clear to auscultation. No crackles, no wheezes  CVS: S1,S2 RRR. No murmurs  Abd: No distention, soft, non-tender.  No masses palpable  Extr: Pedal edema, some small blisters on legs  MSK: No joint deformities or swelling  Neurological: Grossly intact.     Data Reviewed: I have personally reviewed following labs and imaging studies  CBC: Recent Labs  Lab 09/13/23 2057 09/14/23 1144 09/18/23 0331  WBC 5.6 4.7 4.4  NEUTROABS  --  2.8  --   HGB 13.3 12.8 12.5  HCT 41.9 39.5 38.2  MCV 96.8 96.1 92.9  PLT 194 168 176   Basic Metabolic Panel: Recent Labs  Lab 09/14/23 1144  09/15/23 0308 09/16/23 0303 09/17/23 0247 09/18/23 0331  NA 141 141 142 143 140  K 4.3 3.4* 3.7 3.6 3.6  CL 106 104 105 103 101  CO2 20* 27 27 30 30   GLUCOSE 213* 131* 134* 111* 117*  BUN 16 18 23 23  24*  CREATININE 1.02* 1.04* 1.08* 1.00 1.07*  CALCIUM 8.6* 8.6* 8.8* 8.9 8.6*  MG 2.1 2.0 2.0 2.0 2.0   GFR: Estimated Creatinine Clearance: 52.5 mL/min (A) (by C-G formula based on SCr of 1.07 mg/dL (H)). Liver Function Tests: Recent Labs  Lab 09/14/23 1144  AST 43*  ALT 21  ALKPHOS 97  BILITOT 1.9*  PROT 6.2*  ALBUMIN 3.0*   Radiology Studies: No results found.    Scheduled Meds:  dabigatran  150 mg Oral BID   empagliflozin  10 mg Oral QAC breakfast   feeding supplement  237 mL Oral BID BM   methIMAzole  10 mg Oral BID   metoprolol succinate  100 mg Oral Daily   And   metoprolol succinate  100 mg Oral QHS   potassium chloride  40 mEq Oral Daily   Continuous Infusions:  furosemide       LOS: 4 days    Time spent: 35 minutes   MDALA-GAUSI, Gwenette Greet, MD   [facility] 09/18/2023, 4:57 PM   Available via Epic secure chat 7am-7pm After these hours, please refer to coverage provider listed on [email or web address]

## 2023-09-18 NOTE — Progress Notes (Signed)
 Occupational Therapy Treatment Patient Details Name: Julia Mcfarland MRN: [id number] DOB: [birth year] Today's Date: 09/18/2023   History of present illness Pt is a 78 yo female presenting to [facility] on 09/13/23 with worsening BLE edema and SOB. PMH of CHF, afib, morbid obesity, OA, DJD, L TKA.   OT comments  Session focus on increasing patient's overall awareness of CHF management. Patient already had book provided, therefore going over fluid and salt intake, as well as monitoring weight each day. Patient with verbal acknowledgement. Patient also provided education with regard to edema management in BLEs. OT recommendations remain appopriate; will follow.       If plan is discharge home, recommend the following:  Assistance with cooking/housework   Equipment Recommendations  None recommended by OT    Recommendations for Other Services      Precautions / Restrictions Precautions Precautions: Fall Recall of Precautions/Restrictions: Intact Restrictions Weight Bearing Restrictions Per Provider Order: No       Mobility Bed Mobility               General bed mobility comments: up in recliner upon arrival    Transfers                   General transfer comment: had recently ambulated with mobility     Balance                                           ADL either performed or assessed with clinical judgement   ADL Overall ADL's : Needs assistance/impaired                                     Functional mobility during ADLs: Supervision/safety;Cane General ADL Comments: Session focus on increasing patient's overall awareness of CHF management. Patient already had book provided, therefore going over fluid and salt intake, as well as monitoring weight each day. Patient with verbal acknowledgement. Patient also provided education with regard to edema management in BLEs. OT recommendations remain appopriate; will follow.    Extremity/Trunk  Assessment              Vision       Perception     Praxis     Communication     Cognition Arousal: Alert Behavior During Therapy: WFL for tasks assessed/performed Cognition: No apparent impairments                               Following commands: Intact        Cueing   Cueing Techniques: Verbal cues  Exercises      Shoulder Instructions       General Comments HR as high as 121 during education, non-sustaining    Pertinent Vitals/ Pain       Pain Assessment Pain Assessment: No/denies pain  Home Living                                          Prior Functioning/Environment              Frequency  Min 2X/week        Progress Toward Goals  OT  Goals(current goals can now be found in the care plan section)  Progress towards OT goals: Progressing toward goals  Acute Rehab OT Goals Patient Stated Goal: to go home OT Goal Formulation: With patient Time For Goal Achievement: 09/29/23  Plan      Co-evaluation                 AM-PAC OT "6 Clicks" Daily Activity     Outcome Measure   Help from another person eating meals?: None Help from another person taking care of personal grooming?: A Little Help from another person toileting, which includes using toliet, bedpan, or urinal?: A Little Help from another person bathing (including washing, rinsing, drying)?: A Little Help from another person to put on and taking off regular upper body clothing?: A Little Help from another person to put on and taking off regular lower body clothing?: A Little 6 Click Score: 19    End of Session    OT Visit Diagnosis: Other abnormalities of gait and mobility (R26.89);Muscle weakness (generalized) (M62.81)   Activity Tolerance Patient tolerated treatment well   Patient Left in chair;with call bell/phone within reach   Nurse Communication Mobility status        Time: 8657-8469 OT Time Calculation (min): 12  min  Charges: OT General Charges $OT Visit: 1 Visit OT Treatments $Self Care/Home Management : 8-22 mins  Pollyann Glen E. Jullia Mulligan, OTR/L Acute Rehabilitation Services [phone]   Cherlyn Cushing 09/18/2023, 12:32 PM

## 2023-09-18 NOTE — Plan of Care (Signed)
  Problem: Education: Goal: Knowledge of General Education information will improve Description: Including pain rating scale, medication(s)/side effects and non-pharmacologic comfort measures Outcome: Progressing   Problem: Health Behavior/Discharge Planning: Goal: Ability to manage health-related needs will improve Outcome: Progressing   Problem: Clinical Measurements: Goal: Will remain free from infection Outcome: Progressing   Problem: Activity: Goal: Risk for activity intolerance will decrease Outcome: Progressing   Problem: Nutrition: Goal: Adequate nutrition will be maintained Outcome: Progressing   Problem: Safety: Goal: Ability to remain free from injury will improve Outcome: Progressing   Problem: Skin Integrity: Goal: Risk for impaired skin integrity will decrease Outcome: Progressing   Problem: Coping: Goal: Level of anxiety will decrease Outcome: Not Progressing

## 2023-09-18 NOTE — Progress Notes (Signed)
 Mobility Specialist Progress Note:   09/18/23 0926  Mobility  Activity Ambulated with assistance in hallway  Level of Assistance Standby assist, set-up cues, supervision of patient - no hands on  Assistive Device Cane  Distance Ambulated (ft) 300 ft  Activity Response Tolerated well  Mobility Referral Yes  Mobility visit 1 Mobility  Mobility Specialist Start Time (ACUTE ONLY) [id number]  Mobility Specialist Stop Time (ACUTE ONLY) [id number]  Mobility Specialist Time Calculation (min) (ACUTE ONLY) 12 min   Pt agreeable to mobility session. Required no physical assistance, only supervision throughout ambulation with cane. No c/o throughout. Pt left in chair with all needs met.   Addison Lank Mobility Specialist Please contact via SecureChat or  Rehab office at [phone]

## 2023-09-19 DIAGNOSIS — I5033 Acute on chronic diastolic (congestive) heart failure: Secondary | ICD-10-CM | POA: Diagnosis not present

## 2023-09-19 LAB — MAGNESIUM: Magnesium: 2.2 mg/dL (ref 1.7–2.4)

## 2023-09-19 LAB — BASIC METABOLIC PANEL
Anion gap: 12 (ref 5–15)
BUN: 25 mg/dL — ABNORMAL HIGH (ref 8–23)
CO2: 29 mmol/L (ref 22–32)
Calcium: 9 mg/dL (ref 8.9–10.3)
Chloride: 98 mmol/L (ref 98–111)
Creatinine, Ser: 1.11 mg/dL — ABNORMAL HIGH (ref 0.44–1.00)
GFR, Estimated: 52 mL/min — ABNORMAL LOW (ref >60)
Glucose, Bld: 110 mg/dL — ABNORMAL HIGH (ref 70–99)
Potassium: 3.8 mmol/L (ref 3.5–5.1)
Sodium: 139 mmol/L (ref 135–145)

## 2023-09-19 NOTE — Progress Notes (Signed)
 PROGRESS NOTE    Julia Mcfarland  UJW:[id number] DOB: 1945-11-27 DOA: 09/13/2023 PCP: Hillery Aldo, NP     Brief Narrative:  78 year old F with PMH of diastolic CHF, PAH, sev TR, mod-sev MR, PAF on Pradaxa, morbid obesity and lower extremity edema presented to ED with worsening DOE and lower extremity edema despite increased dose of Lasix outpatient, and admitted with A-fib with RVR and acute on chronic diastolic CHF.  In ED, in A-fib with RVR with HR to 120s.  Basic labs without significant finding.  Troponin negative.  EKG features A-fib with RVR.  BNP elevated to 360.  No significant finding on CXR.  Patient was started on IV Lasix and Toprol-XL and admitted for further evaluation and management.  Echocardiogram ordered.  Cardiology consulted.   TTE with LVEF of 65 to 70%, indeterminate DD, RVSP of 46, mod LAE, sev RAE, mod MR and sev TR. Thyroid panel suggested significant hyperthyroidism.  Remains on IV Lasix.  Cardiology following.  On home Toprol-XL for A-fib.   Thyroid panel with undetectable TSH and elevated free T4 to 3.28.  Total T3 normal.  Thyroid US with 2.4 cm ill-defined solid nodule in the inferior left thyroid lobe and another smaller nodule.  Started on methimazole.   New events last 24 hours / Subjective: Patient is feeling well. Dose of IV Lasix increased to 100 mg twice daily by cardiology.  Assessment & Plan:   Persistent A-fib with RVR: -Continue home Toprol-XL-100 mg BID. -Continue Pradaxa 150 mg/d -Lasix as below -Optimize electrolytes -IV metoprolol 5 mg as needed -Telemetry   Acute on chronic HFpEF:  -Appreciate cardiology recs: Increased IV Lasix from 80 mg twice daily to 100 mg BID  -Continue to diurese with goal weight 238 pounds. -Continue home Jardiance 10 mg daily -Strict intake and output, daily weight, renal functions and electrolytes - Seen by PT and will discharge home.   Severe TR/moderate MR -Per cardiology.   Primary hyperthyroidism:   -Previous rounder discussed with Dr. Sharl Ma with [facility]-recommended checking thyroglobulin antibody and TSI.  He will arrange outpatient follow-up in 2 weeks -Continue Toprol-XL. -Started methimazole 10 mg twice daily   Hypokalemia -Monitor replenish as appropriate   Morbid obesity Body mass index is 44.57 kg/m. -Encourage lifestyle change to lose weight.   DVT prophylaxis: Pradaxa Code Status: Full Family Communication: N/a Coming From: Home Disposition Plan: Home when at goal weight Barriers to Discharge: IV diuretics   Consultants:  Cardiology  Procedures:  None  Objective: Vitals:   09/19/23 0511 09/19/23 0549 09/19/23 0708 09/19/23 1151  BP: (!) 91/46  103/66 (!) 109/91  Pulse: (!) 121 100 (!) 103 (!) 103  Resp: 20  20 (!) 22  Temp: 98.3 F (36.8 C)  98.4 F (36.9 C) 97.7 F (36.5 C)  TempSrc: Oral  Oral Oral  SpO2: 92%  94% 99%  Weight: 109.9 kg     Height:        Intake/Output Summary (Last 24 hours) at 09/19/2023 1406 Last data filed at 09/19/2023 0900 Gross per 24 hour  Intake 180 ml  Output 2000 ml  Net -1820 ml   Filed Weights   09/17/23 0437 09/18/23 0335 09/19/23 0511  Weight: 112.2 kg 110.9 kg 109.9 kg    Physical Examination:   General: Alert, oriented X3  Eyes: Pupils equal, reactive  Oral cavity: moist mucous membranes  Head: Atraumatic, normocephalic  Neck: supple  Chest: clear to auscultation. No crackles, no wheezes  CVS: S1,S2 RRR. No  murmurs  Abd: No distention, soft, non-tender. No masses palpable  Extr: Legs in wraps MSK: No joint deformities or swelling  Neurological: Grossly intact.     Data Reviewed: I have personally reviewed following labs and imaging studies  CBC: Recent Labs  Lab 09/13/23 2057 09/14/23 1144 09/18/23 0331  WBC 5.6 4.7 4.4  NEUTROABS  --  2.8  --   HGB 13.3 12.8 12.5  HCT 41.9 39.5 38.2  MCV 96.8 96.1 92.9  PLT 194 168 176   Basic Metabolic Panel: Recent Labs  Lab 09/15/23 0308  09/16/23 0303 09/17/23 0247 09/18/23 0331 09/19/23 0251  NA 141 142 143 140 139  K 3.4* 3.7 3.6 3.6 3.8  CL 104 105 103 101 98  CO2 27 27 30 30 29   GLUCOSE 131* 134* 111* 117* 110*  BUN 18 23 23  24* 25*  CREATININE 1.04* 1.08* 1.00 1.07* 1.11*  CALCIUM 8.6* 8.8* 8.9 8.6* 9.0  MG 2.0 2.0 2.0 2.0 2.2   GFR: Estimated Creatinine Clearance: 50.4 mL/min (A) (by C-G formula based on SCr of 1.11 mg/dL (H)). Liver Function Tests: Recent Labs  Lab 09/14/23 1144  AST 43*  ALT 21  ALKPHOS 97  BILITOT 1.9*  PROT 6.2*  ALBUMIN 3.0*   Radiology Studies: No results found.    Scheduled Meds:  dabigatran  150 mg Oral BID   empagliflozin  10 mg Oral QAC breakfast   feeding supplement  237 mL Oral BID BM   methIMAzole  10 mg Oral BID   metoprolol succinate  100 mg Oral Daily   And   metoprolol succinate  100 mg Oral QHS   potassium chloride  40 mEq Oral Daily   Continuous Infusions:  furosemide 100 mg (09/19/23 0915)     LOS: 5 days    Time spent: 35 minutes   MDALA-GAUSI, Gwenette Greet, MD   [facility] 09/19/2023, 2:06 PM   Available via Epic secure chat 7am-7pm After these hours, please refer to coverage provider listed on [email or web address]

## 2023-09-19 NOTE — TOC Progression Note (Addendum)
 Transition of Care [facility]) - Progression Note    Patient Details  Name: Julia Mcfarland MRN: [id number] Date of Birth: 1946-06-25  Transition of Care [facility]) CM/SW Contact  Leone Haven, RN Phone Number: 09/19/2023, 11:13 AM  Clinical Narrative:    Continues on lasix IV , indep, ambulatory, TOC following.  Per MD still needs diuresing, plan for later this week.        Expected Discharge Plan and Services                                               Social Determinants of Health (SDOH) Interventions SDOH Screenings   Food Insecurity: No Food Insecurity (09/14/2023)  Housing: High Risk (09/14/2023)  Transportation Needs: No Transportation Needs (09/14/2023)  Utilities: Not At Risk (09/14/2023)  Alcohol Screen: Low Risk  (08/05/2022)  Depression (PHQ2-9): Low Risk  (03/19/2020)  Financial Resource Strain: Low Risk  (08/05/2022)  Social Connections: Moderately Integrated (09/14/2023)  Tobacco Use: Low Risk  (09/14/2023)    Readmission Risk Interventions     No data to display

## 2023-09-19 NOTE — Progress Notes (Signed)
 Physical Therapy Treatment Patient Details Name: BEONCA GIBB MRN: [id number] DOB: 11-Aug-1945 Today's Date: 09/19/2023   History of Present Illness Pt is a 78 yo female presenting to [facility] on 09/13/23 with worsening BLE edema and SOB. PMH of CHF, afib, morbid obesity, OA, DJD, L TKA.    PT Comments  Focused session on balance and lower extremity strengthening exercises. She did well with dynamic reaching in Rhomberg stance, was challenged (per pt report) but successful with dynamic reaching in semi-tandem stance, but was unable to maintain static tandem stance. She also had difficulty with braided stepping, suggesting balance deficits with very narrow stances. She was able to take a few steps forward without DME to go to the chair to sit at end of session, but benefits from her [facility] for longer distance mobility. Will continue to follow acutely.   If plan is discharge home, recommend the following: A little help with walking and/or transfers;A little help with bathing/dressing/bathroom;Assistance with cooking/housework;Assist for transportation;Help with stairs or ramp for entrance   Can travel by private vehicle        Equipment Recommendations  Cane    Recommendations for Other Services       Precautions / Restrictions Precautions Precautions: Fall Recall of Precautions/Restrictions: Intact Restrictions Weight Bearing Restrictions Per Provider Order: No     Mobility  Bed Mobility               General bed mobility comments: Pt sitting up in recliner upon arrival and at end of session.    Transfers Overall transfer level: Modified independent Equipment used: Straight cane Transfers: Sit to/from Stand Sit to Stand: Modified independent (Device/Increase time)           General transfer comment: Pt able to transfer to stand from recliner without LOB or need for assistance    Ambulation/Gait Ambulation/Gait assistance: Supervision Gait Distance (Feet): 40  Feet Assistive device: Straight cane, None Gait Pattern/deviations: Step-through pattern, Trendelenburg, Wide base of support, Trunk flexed Gait velocity: reduced Gait velocity interpretation: 1.31 - 2.62 ft/sec, indicative of limited community ambulator   General Gait Details: Pt takes slow, wide steps with a slightly flexed posture. She is capable of ambulating at least ~4 ft without UE support, but primarily utilized her [facility] for stability. No LOB, supervision for safety   Stairs             Wheelchair Mobility     Tilt Bed    Modified Rankin (Stroke Patients Only)       Balance Overall balance assessment: Needs assistance Sitting-balance support: No upper extremity supported, Feet supported Sitting balance-Leahy Scale: Good     Standing balance support: Single extremity supported, During functional activity, No upper extremity supported, Bilateral upper extremity supported Standing balance-Leahy Scale: Fair Standing balance comment: Pt benefits from a SPC for longer distance gait bouts and from bil UE support with dynamic standing challenges, but is capable of taking a few steps without UE support or reaching off COG in Rhomberg stance without LOB     Tandem Stance - Right Leg: 1 (second, unable to maintain without UE support, minA to recover LOB)   Rhomberg - Eyes Opened: 1 (minute, reaching off COG and across midline bil, no UE support, CGA for safety)   High level balance activites: Side stepping, Braiding, Backward walking High Level Balance Comments: Pt stood at sink and held Rhomberg stance ~1 min (eyes open, no UE support, reaching off COG and across midline bil, CGA), semi-tandem stance ~  1 min each leg in lead (eyes open, no UE support, reaching off COG and across midline bil, CGA), tandem stance 1 second before needing minA to recover (eyes open, no UE support, static). Pt tried braided stepping x1-2 reps bil with SPC in one hand and other hand on sink, but  unable to complete without LOB and minA to recover. Performed side stepping with SPC, CGA for safety, x5 reps bil. Backwards walking x10 reps, SPC, CGA for safety.            Communication Communication Communication: No apparent difficulties  Cognition Arousal: Alert Behavior During Therapy: WFL for tasks assessed/performed   PT - Cognitive impairments: No apparent impairments                         Following commands: Intact      Cueing Cueing Techniques: Verbal cues  Exercises General Exercises - Lower Extremity Hip ABduction/ADduction: AROM, Strengthening, Both, 10 reps, Standing (with SPC in one hand and other hand on sink) Hip Flexion/Marching: AROM, Strengthening, Both, 10 reps, Standing (with SPC in one hand and other hand on sink) Heel Raises: AROM, Strengthening, Both, 10 reps, Standing (with SPC in one hand and other hand on sink) Mini-Sqauts: AROM, Strengthening, Both, 10 reps, Standing (with SPC in one hand) Other Exercises Other Exercises: see High Level Balance Comments above for balance exercises performed    General Comments        Pertinent Vitals/Pain Pain Assessment Pain Assessment: Faces Faces Pain Scale: No hurt Pain Intervention(s): Monitored during session    Home Living                          Prior Function            PT Goals (current goals can now be found in the care plan section) Acute Rehab PT Goals Patient Stated Goal: return home PT Goal Formulation: With patient Time For Goal Achievement: 09/30/23 Potential to Achieve Goals: Good Progress towards PT goals: Progressing toward goals    Frequency    Min 2X/week      PT Plan      Co-evaluation              AM-PAC PT "6 Clicks" Mobility   Outcome Measure  Help needed turning from your back to your side while in a flat bed without using bedrails?: None Help needed moving from lying on your back to sitting on the side of a flat bed without  using bedrails?: None Help needed moving to and from a bed to a chair (including a wheelchair)?: None Help needed standing up from a chair using your arms (e.g., wheelchair or bedside chair)?: None Help needed to walk in hospital room?: A Little Help needed climbing 3-5 steps with a railing? : A Little 6 Click Score: 22    End of Session Equipment Utilized During Treatment: Gait belt Activity Tolerance: Patient tolerated treatment well Patient left: with call bell/phone within reach;in chair   PT Visit Diagnosis: Unsteadiness on feet (R26.81);Other abnormalities of gait and mobility (R26.89);Muscle weakness (generalized) (M62.81);Difficulty in walking, not elsewhere classified (R26.2)     Time: 6578-4696 PT Time Calculation (min) (ACUTE ONLY): 19 min  Charges:    $Therapeutic Exercise: 8-22 mins PT General Charges $$ ACUTE PT VISIT: 1 Visit  Virgil Benedict, PT, DPT Acute Rehabilitation Services  Office: [phone]    Bettina Gavia 09/19/2023, 3:20 PM

## 2023-09-19 NOTE — Plan of Care (Signed)

## 2023-09-19 NOTE — Progress Notes (Signed)
 Progress Note  Patient Name: Julia Mcfarland Date of Encounter: 09/19/2023 Primary Cardiologist: Thurmon Fair, MD   Subjective   Continues to diurese.  BP has decreased. No CP, SOB, Palpitations.  Ambulating with cane.  Vital Signs    Vitals:   09/19/23 0100 09/19/23 0511 09/19/23 0549 09/19/23 0708  BP:  (!) 91/46  103/66  Pulse: 100 (!) 121 100 (!) 103  Resp:  20  20  Temp:  98.3 F (36.8 C)  98.4 F (36.9 C)  TempSrc:  Oral  Oral  SpO2:  92%  94%  Weight:  109.9 kg    Height:        Intake/Output Summary (Last 24 hours) at 09/19/2023 0909 Last data filed at 09/19/2023 0513 Gross per 24 hour  Intake 470 ml  Output 2100 ml  Net -1630 ml   Filed Weights   09/17/23 0437 09/18/23 0335 09/19/23 0511  Weight: 112.2 kg 110.9 kg 109.9 kg    Physical Exam   GEN: No acute distress.   Neck: JVD Cardiac: iRRR, holosystolic murmur with no rubs, or gallops.  Respiratory: Clear to auscultation bilaterally. GI: Soft, nontender, non-distended  MS: UNNA boot well wrapped.   Labs   Telemetry: AF - rates ~ 105 bpm   Chemistry Recent Labs  Lab 09/14/23 1144 09/15/23 0308 09/17/23 0247 09/18/23 0331 09/19/23 0251  NA 141   < > 143 140 139  K 4.3   < > 3.6 3.6 3.8  CL 106   < > 103 101 98  CO2 20*   < > 30 30 29   GLUCOSE 213*   < > 111* 117* 110*  BUN 16   < > 23 24* 25*  CREATININE 1.02*   < > 1.00 1.07* 1.11*  CALCIUM 8.6*   < > 8.9 8.6* 9.0  PROT 6.2*  --   --   --   --   ALBUMIN 3.0*  --   --   --   --   AST 43*  --   --   --   --   ALT 21  --   --   --   --   ALKPHOS 97  --   --   --   --   BILITOT 1.9*  --   --   --   --   GFRNONAA 57*   < > 58* 54* 52*  ANIONGAP 15   < > 10 9 12    < > = values in this interval not displayed.     Hematology Recent Labs  Lab 09/13/23 2057 09/14/23 1144 09/18/23 0331  WBC 5.6 4.7 4.4  RBC 4.33 4.11 4.11  HGB 13.3 12.8 12.5  HCT 41.9 39.5 38.2  MCV 96.8 96.1 92.9  MCH 30.7 31.1 30.4  MCHC 31.7 32.4 32.7  RDW  14.6 14.8 14.2  PLT 194 168 176    BNP Recent Labs  Lab 09/13/23 2057  BNP 359.3*      Cardiac Studies   Cardiac Studies & Procedures   ______________________________________________________________________________________________     ECHOCARDIOGRAM  ECHOCARDIOGRAM COMPLETE 09/14/2023  Narrative ECHOCARDIOGRAM REPORT    Patient Name:   Julia Mcfarland Date of Exam: 09/14/2023 Medical Rec #:  [id number]      Height:       62.0 in Accession #:    [id number]     Weight:       238.1 lb Date of Birth:  29-Apr-1946  BSA:          2.059 m Patient Age:    77 years       BP:           99/67 mmHg Patient Gender: F              HR:           123 bpm. Exam Location:  Inpatient  Procedure: 2D Echo, Color Doppler and Cardiac Doppler (Both Spectral and Color Flow Doppler were utilized during procedure).  Indications:     CHF  History:         Patient has prior history of Echocardiogram examinations. CHF; Arrythmias:Atrial Fibrillation.  Sonographer:     Lamont Snowball Referring Phys:  [id number] Eduard Clos Diagnosing Phys: Armanda Magic MD  IMPRESSIONS   1. Left ventricular ejection fraction, by estimation, is 65 to 70%. The left ventricle has normal function. The left ventricle has no regional wall motion abnormalities. Left ventricular diastolic function could not be evaluated. 2. Right ventricular systolic function is normal. The right ventricular size is severely enlarged. There is moderately elevated pulmonary artery systolic pressure. The estimated right ventricular systolic pressure is 46.1 mmHg. 3. Left atrial size was moderately dilated. 4. Right atrial size was severely dilated. 5. The mitral valve is normal in structure. Moderate mitral valve regurgitation. No evidence of mitral stenosis. 6. 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 7. The aortic valve is normal in structure. Aortic valve regurgitation is not visualized. No aortic stenosis is present. 8. The  inferior vena cava is dilated in size with <50% respiratory variability, suggesting right atrial pressure of 15 mmHg.  FINDINGS Left Ventricle: Left ventricular ejection fraction, by estimation, is 65 to 70%. The left ventricle has normal function. The left ventricle has no regional wall motion abnormalities. The left ventricular internal cavity size was normal in size. There is no left ventricular hypertrophy. Left ventricular diastolic function could not be evaluated due to atrial fibrillation. Left ventricular diastolic function could not be evaluated.  Right Ventricle: The right ventricular size is severely enlarged. No increase in right ventricular wall thickness. Right ventricular systolic function is normal. There is moderately elevated pulmonary artery systolic pressure. The tricuspid regurgitant velocity is 2.79 m/s, and with an assumed right atrial pressure of 15 mmHg, the estimated right ventricular systolic pressure is 46.1 mmHg.  Left Atrium: Left atrial size was moderately dilated.  Right Atrium: Right atrial size was severely dilated.  Pericardium: There is no evidence of pericardial effusion.  Mitral Valve: The mitral valve is normal in structure. Mild mitral annular calcification. Moderate mitral valve regurgitation, with eccentric posteriorly directed jet. No evidence of mitral valve stenosis. MV peak gradient, 6.2 mmHg. The mean mitral valve gradient is 3.3 mmHg.  Tricuspid Valve: The tricuspid valve is normal in structure. 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 No evidence of tricuspid stenosis.  Aortic Valve: The aortic valve is normal in structure. Aortic valve regurgitation is not visualized. No aortic stenosis is present. Aortic valve peak gradient measures 13.7 mmHg.  Pulmonic Valve: The pulmonic valve was normal in structure. Pulmonic valve regurgitation is not visualized. No evidence of pulmonic stenosis.  Aorta: The aortic root is normal in size and  structure.  Venous: The inferior vena cava is dilated in size with less than 50% respiratory variability, suggesting right atrial pressure of 15 mmHg.  IAS/Shunts: No atrial level shunt detected by color flow Doppler.   LEFT VENTRICLE PLAX 2D LVIDd:  4.00 cm LVIDs:         3.10 cm LV PW:         0.90 cm LV IVS:        0.70 cm LVOT diam:     2.10 cm LV SV:         67 LV SV Index:   33 LVOT Area:     3.46 cm   RIGHT VENTRICLE          IVC RV Basal diam:  5.60 cm  IVC diam: 2.90 cm  LEFT ATRIUM             Index        RIGHT ATRIUM           Index LA Vol (A2C):   94.5 ml 45.90 ml/m  RA Area:     26.50 cm LA Vol (A4C):   76.5 ml 37.16 ml/m  RA Volume:   88.90 ml  43.18 ml/m LA Biplane Vol: 91.8 ml 44.59 ml/m AORTIC VALVE AV Area (Vmax): 2.92 cm AV Vmax:        185.33 cm/s AV Peak Grad:   13.7 mmHg LVOT Vmax:      156.25 cm/s LVOT Vmean:     104.850 cm/s LVOT VTI:       0.194 m  AORTA Ao Root diam: 2.90 cm Ao Asc diam:  3.20 cm  MITRAL VALVE                TRICUSPID VALVE MV Area (PHT): 5.51 cm     TR Peak grad:   31.1 mmHg MV Area VTI:   3.84 cm     TR Vmax:        279.00 cm/s MV Peak grad:  6.2 mmHg MV Mean grad:  3.3 mmHg     SHUNTS MV Vmax:       1.25 m/s     Systemic VTI:  0.19 m MV Vmean:      84.1 cm/s    Systemic Diam: 2.10 cm MV Decel Time: 138 msec MR Peak grad: 97.6 mmHg MR Vmax:      494.00 cm/s MV E velocity: 105.67 cm/s  Armanda Magic MD Electronically signed by Armanda Magic MD Signature Date/Time: 09/14/2023/10:48:47 AM    Final (Updated)    MONITORS  LONG TERM MONITOR (3-14 DAYS) 03/11/2020  Narrative  The dominant rhythm is normal sinus rhythm with normal circadian variation.  There are no episodes of significant bradycardia.  Episode of paroxysmal atrial fibrillation lasting more than 2 days was recorded, with poor ventricular rate control (average ventricular rate of 136 bpm)  Also seen are very brief episodes of  nonsustained atrial tachycardia and a single 6 beat episode of nonsustained ventricular tachycardia.  Abnormal event monitor due to the presence of paroxysmal atrial fibrillation with rapid ventricular response.     CARDIAC MRI  MR CARDIAC MORPHOLOGY W WO CONTRAST 11/16/2022  Narrative CLINICAL DATA:  R/O Amyloid  EXAM: CARDIAC MRI  TECHNIQUE: The patient was scanned on a 1.5 Tesla Siemens magnet. A dedicated cardiac coil was used. Functional imaging was done using Fiesta sequences. 2,3, and 4 chamber views were done to assess for RWMA's. Modified Simpson's rule using a short axis stack was used to calculate an ejection fraction on a dedicated work [employer]. The patient received 10 cc of Gadavist. After 10 minutes inversion recovery sequences were used to assess for infiltration and scar tissue.  CONTRAST:  IONGEXBM  FINDINGS: Severe  bi atrial enlargement No LAA thrombus. No ASD/PFO No pericardial effusion Normal IVC diameter 1.6 cm Normal ascending thoracic aorta 3.4 cm. Tri leaflet AV Atrial functional MR appears at least moderate on true FISP images Normal TV/PV.  Normal LV size and function Quantitative EF 52% (EDV 155 ESV 74 SV 81 cc )  Mildly dilated RV with low normal function RVEF 48% (EDV 210 cc ESV 109 cc SV 101 cc )  Estimated cardiac output based on flow velocity through the aortic valve annulus 6.3 L/min  Delayed enhancement images with gadolinium showed normal nulling and no uptake No evidence of amyloid  Parametric measures  T1 1007 msec normal  ECV 34% normal  T2 44 msec normal  IMPRESSION: 1.  Severe bi atrial enlargement no LAA thrombus  2.  Normal LV size and function LVEF 52%  3.  Mild RVE with low normal function RVEF 48%  4. Delayed enhancement images with normal nulling and no gadolinium uptake No evidence of amyloid  5.  Moderate appearing atrial functional MR  6.  Normal parametric measures  7.  Normal  cardiac output 6.3 L/min  Charlton Haws   Electronically Signed By: Charlton Haws M.D. On: 11/16/2022 15:42  PYP SCAN  MYOCARDIAL AMYLOID PLANAR AND SPECT 08/24/2022  Narrative   By semi-quantitative assessment scan is consistent with increased heart uptake but less than rib uptake-Grade 1. Heart to contralateral lung ratio is between 1-1.5, indeterminate for amyloid.   Study is equivocal for TTR amyloidosis (visual score of 1/ratio between 1-1.5).   Prior study not available for comparison.  Equivocal study for TTR amyloid.  Cardiac MRI recommended  ______________________________________________________________________________________________      Assessment & Plan   Acute on Chronic HF (preserved EF) - still volume overloaded;  - NYHA II, Stage C, still hypervolemic. - lasix 100 mg  BID; will not increase further due to BP - UNNA boot doing well - continue SGLT2i  Severe TR Moderate MR - outpatient f/u echo once medically managed (~ 6 months limited) as she would be a candidate for tTEER  PAF - on highest tolerated BB (BP) - if symptomatic RVR will start amiodarone 400 mg PO BID X 10 days  Hypokalemia - K has improved  Morbid Obesity - weight control will improve comorbidity control  As per primary/non-cardiac consultants - hyperthyroidism- likely a driver of RVR  For questions or updates, please contact [facility] Please consult [email or web address] for contact info under Cardiology/STEMI.      Riley Lam, MD FASE [facility] Cardiologist [facility]  [address] [place], [address] [place], [place] [address] [phone]  9:09 AM

## 2023-09-19 NOTE — Progress Notes (Signed)
 Mobility Specialist Progress Note:   09/19/23 0915  Mobility  Activity Ambulated with assistance in hallway  Level of Assistance Standby assist, set-up cues, supervision of patient - no hands on  Assistive Device Cane  Distance Ambulated (ft) 300 ft  Activity Response Tolerated well  Mobility Referral Yes  Mobility visit 1 Mobility  Mobility Specialist Start Time (ACUTE ONLY) 0915  Mobility Specialist Stop Time (ACUTE ONLY) 0930  Mobility Specialist Time Calculation (min) (ACUTE ONLY) 15 min   Pt agreeable to mobility session. Required no physical assistance throughout ambulation with use of cane. HR elevated to 140s with exertion. Pt back in chair with all needs met.   Addison Lank Mobility Specialist Please contact via SecureChat or  Rehab office at [phone]

## 2023-09-20 DIAGNOSIS — N179 Acute kidney failure, unspecified: Secondary | ICD-10-CM | POA: Diagnosis not present

## 2023-09-20 DIAGNOSIS — I5033 Acute on chronic diastolic (congestive) heart failure: Secondary | ICD-10-CM

## 2023-09-20 DIAGNOSIS — N1831 Chronic kidney disease, stage 3a: Secondary | ICD-10-CM

## 2023-09-20 DIAGNOSIS — I4819 Other persistent atrial fibrillation: Secondary | ICD-10-CM | POA: Diagnosis not present

## 2023-09-20 DIAGNOSIS — E66813 Obesity, class 3: Secondary | ICD-10-CM

## 2023-09-20 DIAGNOSIS — E0590 Thyrotoxicosis, unspecified without thyrotoxic crisis or storm: Secondary | ICD-10-CM | POA: Diagnosis not present

## 2023-09-20 LAB — BASIC METABOLIC PANEL
Anion gap: 13 (ref 5–15)
BUN: 30 mg/dL — ABNORMAL HIGH (ref 8–23)
CO2: 30 mmol/L (ref 22–32)
Calcium: 8.8 mg/dL — ABNORMAL LOW (ref 8.9–10.3)
Chloride: 96 mmol/L — ABNORMAL LOW (ref 98–111)
Creatinine, Ser: 1.29 mg/dL — ABNORMAL HIGH (ref 0.44–1.00)
GFR, Estimated: 43 mL/min — ABNORMAL LOW (ref >60)
Glucose, Bld: 118 mg/dL — ABNORMAL HIGH (ref 70–99)
Potassium: 3.4 mmol/L — ABNORMAL LOW (ref 3.5–5.1)
Sodium: 139 mmol/L (ref 135–145)

## 2023-09-20 LAB — MAGNESIUM: Magnesium: 2.3 mg/dL (ref 1.7–2.4)

## 2023-09-20 NOTE — Hospital Course (Addendum)
 Mrs. Julia Mcfarland was admitted to the hospital with the working diagnosis of heart failure exacerbation in the setting of atrial fibrillation with RVR.   78 year old F with PMH of diastolic CHF, PAH, sev TR, mod-sev MR, PAF, obesity class 3, and chronic lower extremity edema presented to ED with worsening dyspnea on exertion and lower extremity edema despite increased dose of Lasix outpatient.  In ED, in A-fib with RVR with HR to 120s,her blood pressure was 106/74, RR 21 and 02 saturation 98%. Lungs with no wheezing or rhonchi, heart with S1 and S2 present, irregularly irregular with no gallops, or rubs, positive systolic murmur at the right lower sternal border, abdomen with no distention and positive lower extremity edema.      Na 142, K 4.0 Cl 105 bicarbonate 26, glucose 111, bun 15 cr 0,84  Mg 2.2  BNP 359  High sensitive troponin 5  Wbc 5.5 hgb 13.3 plt 194   Echocardiogram with preserved LV systolic function, positive signs of pulmonary hypertension and RV failure.   Thyroid panel suggested significant hyperthyroidism.  .     Chest radiograph with right rotation, cardiomegaly with mild bilateral hilar vascular congestion.   EKG 115 bpm, normal axis, normal intervals, qtc 434, atrial fibrillation rhythm with no significant ST segment or T wave changes.    03/20 volume status is improving, she is having intermittent hypotension, plan for TEE cardioversion tomorrow.  03/21 direct current cardioversion with conversion to sinus rhythm.  03/22 improved volume status, blood pressure more stable, she continue sinus rhythm.  Plan to follow up as outpatient.

## 2023-09-20 NOTE — Progress Notes (Signed)
  Progress Note   [name] WGN:[id number] DOB: Apr 13, 1946 DOA: 09/13/2023     6 DOS: the patient was seen and examined on 09/20/2023   Brief hospital course: Mrs. Julia Mcfarland was admitted to the hospital with the working diagnosis of heart failure exacerbation in the setting of atrial fibrillation with RVR.   78 year old F with PMH of diastolic CHF, PAH, sev TR, mod-sev MR, PAF, obesity class 3, and chronic lower extremity edema presented to ED with worsening dyspnea on exertion and lower extremity edema despite increased dose of Lasix outpatient.  In ED, in A-fib with RVR with HR to 120s.  Basic labs without significant finding.  Troponin negative.  EKG features A-fib with RVR.  BNP elevated to 360.  No significant finding on CXR.  Patient was started on IV Lasix and Toprol-XL and admitted for further evaluation and management.  Echocardiogram ordered.  Cardiology consulted.   TTE with LVEF of 65 to 70%, indeterminate DD, RVSP of 46, mod LAE, sev RAE, mod MR and sev TR. Thyroid panel suggested significant hyperthyroidism.  Remains on IV Lasix.  Cardiology following.  On home Toprol-XL for A-fib.   Thyroid panel with undetectable TSH and elevated free T4 to 3.28.  Total T3 normal.  Thyroid US with 2.4 cm ill-defined solid nodule in the inferior left thyroid lobe and another smaller nodule.  Started on methimazole.    Assessment and Plan: * Acute on chronic diastolic CHF (congestive heart failure) (HCC) Echocardiogram with preserved LV systolic function with EF 65 to 70%, RV systolic function preserved, RV with severe enlargement, RVSP 46,1 mmHg, LA with moderate dilatation, RA with severe dilatation, moderate MR, severe TR.  Urine output is 2,600 ml Systolic blood pressure 95 mmHg.   Plan to continue metoprolol succinate.  Considering low blood pressure will hold on am dose of furosemide and if systolic 100 or grater will resume with pm dose.  Continue SGLT 2 inh.   Persistent atrial  fibrillation with RVR (HCC) Continue metoprolol for rate control.  Patient on dabigatran for anticoagulation.   Primary hyperthyroidism Continue methimazole.   AKI (acute kidney injury) (HCC) Hypokalemia.   Renal function with serum cr at 1.29 with K at 3,4 and serum bicarbonate at 30  Na 139 Mg 2.3   Plan for Kcl for K correction   Obesity, class 3 Calculated BMI is 43.2   \    Subjective: Patient is feeling better, she has been dizzy while ambulating and her blood pressure has been low.   Physical Exam: Vitals:   09/20/23 0549 09/20/23 0809 09/20/23 0952 09/20/23 1351  BP: (!) 95/49 (!) 88/62 112/69 95/64  Pulse: 98 86    Resp: 19 19    Temp: 98.2 F (36.8 C) 97.9 F (36.6 C)    TempSrc: Oral Oral    SpO2: 93% 98%    Weight: 107.2 kg     Height:       Neurology awake and alert ENT with mild pallor Cardiovascular with S1 and S2 present, irregularly irregular with no gallops, rubs or murmurs Respiratory with bilateral rales at bases with no wheezing, or rhonchi Abdomen with no distention  Data Reviewed:    Family Communication: no family at the bedside   Disposition: Status is: Inpatient Remains inpatient appropriate because: IV diuresis   Planned Discharge Destination: Home      Author: Coralie Keens, MD 09/20/2023 3:37 PM  For on call review [email or web address].

## 2023-09-20 NOTE — Progress Notes (Signed)
 Mobility Specialist Progress Note:    09/20/23 1200  Mobility  Activity Ambulated with assistance in hallway;Ambulated with assistance in room  Level of Assistance Standby assist, set-up cues, supervision of patient - no hands on  Assistive Device Cane  Distance Ambulated (ft) 300 ft  Activity Response Tolerated well  Mobility Referral Yes  Mobility visit 1 Mobility  Mobility Specialist Start Time (ACUTE ONLY) 1200  Mobility Specialist Stop Time (ACUTE ONLY) 1208  Mobility Specialist Time Calculation (min) (ACUTE ONLY) 8 min   Pt received in chair, agreeable to mobility session. Ambulated in hallway with cane and SBA. Tolerated well, max HR 126 bpm. Returned pt back to room, sitting up comfortably in chair with all needs met.    Feliciana Rossetti Mobility Specialist Please contact via Special educational needs teacher or  Rehab office at [phone]

## 2023-09-20 NOTE — Progress Notes (Signed)
 Progress Note  Patient Name: Julia Mcfarland Date of Encounter: 09/20/2023 Primary Cardiologist: Thurmon Fair, MD   Subjective   Continues to diurese.   Asymptomatic soft BP  Feels well and worked with patient mobility.  Vital Signs    Vitals:   09/19/23 2057 09/20/23 0011 09/20/23 0549 09/20/23 0809  BP:  101/62 (!) 95/49 (!) 88/62  Pulse: (!) 107 91 98 86  Resp:  19 19 19   Temp:  97.7 F (36.5 C) 98.2 F (36.8 C) 97.9 F (36.6 C)  TempSrc:  Oral Oral Oral  SpO2:  96% 93% 98%  Weight:   107.2 kg   Height:        Intake/Output Summary (Last 24 hours) at 09/20/2023 0818 Last data filed at 09/20/2023 1610 Gross per 24 hour  Intake 718 ml  Output 2600 ml  Net -1882 ml   Filed Weights   09/18/23 0335 09/19/23 0511 09/20/23 0549  Weight: 110.9 kg 109.9 kg 107.2 kg    Physical Exam   GEN: No acute distress.   Neck: +JVD even supine Cardiac: iRRR, holosystolic murmur with no rubs, or gallops.  Respiratory: Clear to auscultation bilaterally. GI: Soft, nontender, non-distended  MS: UNNA boot well wrapped. Leg swelling has decreased  Labs   Telemetry: AF ~ 105   Chemistry Recent Labs  Lab 09/14/23 1144 09/15/23 0308 09/18/23 0331 09/19/23 0251 09/20/23 0218  NA 141   < > 140 139 139  K 4.3   < > 3.6 3.8 3.4*  CL 106   < > 101 98 96*  CO2 20*   < > 30 29 30   GLUCOSE 213*   < > 117* 110* 118*  BUN 16   < > 24* 25* 30*  CREATININE 1.02*   < > 1.07* 1.11* 1.29*  CALCIUM 8.6*   < > 8.6* 9.0 8.8*  PROT 6.2*  --   --   --   --   ALBUMIN 3.0*  --   --   --   --   AST 43*  --   --   --   --   ALT 21  --   --   --   --   ALKPHOS 97  --   --   --   --   BILITOT 1.9*  --   --   --   --   GFRNONAA 57*   < > 54* 52* 43*  ANIONGAP 15   < > 9 12 13    < > = values in this interval not displayed.     Hematology Recent Labs  Lab 09/13/23 2057 09/14/23 1144 09/18/23 0331  WBC 5.6 4.7 4.4  RBC 4.33 4.11 4.11  HGB 13.3 12.8 12.5  HCT 41.9 39.5 38.2  MCV  96.8 96.1 92.9  MCH 30.7 31.1 30.4  MCHC 31.7 32.4 32.7  RDW 14.6 14.8 14.2  PLT 194 168 176    BNP Recent Labs  Lab 09/13/23 2057  BNP 359.3*      Cardiac Studies   Cardiac Studies & Procedures   ______________________________________________________________________________________________     ECHOCARDIOGRAM  ECHOCARDIOGRAM COMPLETE 09/14/2023  Narrative ECHOCARDIOGRAM REPORT    Patient Name:   Julia Mcfarland Date of Exam: 09/14/2023 Medical Rec #:  [id number]      Height:       62.0 in Accession #:    [id number]     Weight:       238.1 lb Date of Birth:  28-Jan-1946      BSA:          2.059 m Patient Age:    78 years       BP:           99/67 mmHg Patient Gender: F              HR:           123 bpm. Exam Location:  Inpatient  Procedure: 2D Echo, Color Doppler and Cardiac Doppler (Both Spectral and Color Flow Doppler were utilized during procedure).  Indications:     CHF  History:         Patient has prior history of Echocardiogram examinations. CHF; Arrythmias:Atrial Fibrillation.  Sonographer:     Lamont Snowball Referring Phys:  [id number] Eduard Clos Diagnosing Phys: Armanda Magic MD  IMPRESSIONS   1. Left ventricular ejection fraction, by estimation, is 65 to 70%. The left ventricle has normal function. The left ventricle has no regional wall motion abnormalities. Left ventricular diastolic function could not be evaluated. 2. Right ventricular systolic function is normal. The right ventricular size is severely enlarged. There is moderately elevated pulmonary artery systolic pressure. The estimated right ventricular systolic pressure is 46.1 mmHg. 3. Left atrial size was moderately dilated. 4. Right atrial size was severely dilated. 5. The mitral valve is normal in structure. Moderate mitral valve regurgitation. No evidence of mitral stenosis. 6. 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 7. The aortic valve is normal in structure. Aortic valve regurgitation  is not visualized. No aortic stenosis is present. 8. The inferior vena cava is dilated in size with <50% respiratory variability, suggesting right atrial pressure of 15 mmHg.  FINDINGS Left Ventricle: Left ventricular ejection fraction, by estimation, is 65 to 70%. The left ventricle has normal function. The left ventricle has no regional wall motion abnormalities. The left ventricular internal cavity size was normal in size. There is no left ventricular hypertrophy. Left ventricular diastolic function could not be evaluated due to atrial fibrillation. Left ventricular diastolic function could not be evaluated.  Right Ventricle: The right ventricular size is severely enlarged. No increase in right ventricular wall thickness. Right ventricular systolic function is normal. There is moderately elevated pulmonary artery systolic pressure. The tricuspid regurgitant velocity is 2.79 m/s, and with an assumed right atrial pressure of 15 mmHg, the estimated right ventricular systolic pressure is 46.1 mmHg.  Left Atrium: Left atrial size was moderately dilated.  Right Atrium: Right atrial size was severely dilated.  Pericardium: There is no evidence of pericardial effusion.  Mitral Valve: The mitral valve is normal in structure. Mild mitral annular calcification. Moderate mitral valve regurgitation, with eccentric posteriorly directed jet. No evidence of mitral valve stenosis. MV peak gradient, 6.2 mmHg. The mean mitral valve gradient is 3.3 mmHg.  Tricuspid Valve: The tricuspid valve is normal in structure. 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 No evidence of tricuspid stenosis.  Aortic Valve: The aortic valve is normal in structure. Aortic valve regurgitation is not visualized. No aortic stenosis is present. Aortic valve peak gradient measures 13.7 mmHg.  Pulmonic Valve: The pulmonic valve was normal in structure. Pulmonic valve regurgitation is not visualized. No evidence of pulmonic  stenosis.  Aorta: The aortic root is normal in size and structure.  Venous: The inferior vena cava is dilated in size with less than 50% respiratory variability, suggesting right atrial pressure of 15 mmHg.  IAS/Shunts: No atrial level shunt detected by color flow Doppler.   LEFT VENTRICLE PLAX  2D LVIDd:         4.00 cm LVIDs:         3.10 cm LV PW:         0.90 cm LV IVS:        0.70 cm LVOT diam:     2.10 cm LV SV:         67 LV SV Index:   33 LVOT Area:     3.46 cm   RIGHT VENTRICLE          IVC RV Basal diam:  5.60 cm  IVC diam: 2.90 cm  LEFT ATRIUM             Index        RIGHT ATRIUM           Index LA Vol (A2C):   94.5 ml 45.90 ml/m  RA Area:     26.50 cm LA Vol (A4C):   76.5 ml 37.16 ml/m  RA Volume:   88.90 ml  43.18 ml/m LA Biplane Vol: 91.8 ml 44.59 ml/m AORTIC VALVE AV Area (Vmax): 2.92 cm AV Vmax:        185.33 cm/s AV Peak Grad:   13.7 mmHg LVOT Vmax:      156.25 cm/s LVOT Vmean:     104.850 cm/s LVOT VTI:       0.194 m  AORTA Ao Root diam: 2.90 cm Ao Asc diam:  3.20 cm  MITRAL VALVE                TRICUSPID VALVE MV Area (PHT): 5.51 cm     TR Peak grad:   31.1 mmHg MV Area VTI:   3.84 cm     TR Vmax:        279.00 cm/s MV Peak grad:  6.2 mmHg MV Mean grad:  3.3 mmHg     SHUNTS MV Vmax:       1.25 m/s     Systemic VTI:  0.19 m MV Vmean:      84.1 cm/s    Systemic Diam: 2.10 cm MV Decel Time: 138 msec MR Peak grad: 97.6 mmHg MR Vmax:      494.00 cm/s MV E velocity: 105.67 cm/s  Armanda Magic MD Electronically signed by Armanda Magic MD Signature Date/Time: 09/14/2023/10:48:47 AM    Final (Updated)    MONITORS  LONG TERM MONITOR (3-14 DAYS) 03/11/2020  Narrative  The dominant rhythm is normal sinus rhythm with normal circadian variation.  There are no episodes of significant bradycardia.  Episode of paroxysmal atrial fibrillation lasting more than 2 days was recorded, with poor ventricular rate control (average ventricular rate  of 136 bpm)  Also seen are very brief episodes of nonsustained atrial tachycardia and a single 6 beat episode of nonsustained ventricular tachycardia.  Abnormal event monitor due to the presence of paroxysmal atrial fibrillation with rapid ventricular response.     CARDIAC MRI  MR CARDIAC MORPHOLOGY W WO CONTRAST 11/16/2022  Narrative CLINICAL DATA:  R/O Amyloid  EXAM: CARDIAC MRI  TECHNIQUE: The patient was scanned on a 1.5 Tesla Siemens magnet. A dedicated cardiac coil was used. Functional imaging was done using Fiesta sequences. 2,3, and 4 chamber views were done to assess for RWMA's. Modified Simpson's rule using a short axis stack was used to calculate an ejection fraction on a dedicated work [employer], [employer]. The patient received 10 cc of Gadavist. After 10 minutes inversion recovery sequences were used to assess for infiltration  and scar tissue.  CONTRAST:  [id number]  FINDINGS: Severe bi atrial enlargement No LAA thrombus. No ASD/PFO No pericardial effusion Normal IVC diameter 1.6 cm Normal ascending thoracic aorta 3.4 cm. Tri leaflet AV Atrial functional MR appears at least moderate on true FISP images Normal TV/PV.  Normal LV size and function Quantitative EF 52% (EDV 155 ESV 74 SV 81 cc )  Mildly dilated RV with low normal function RVEF 48% (EDV 210 cc ESV 109 cc SV 101 cc )  Estimated cardiac output based on flow velocity through the aortic valve annulus 6.3 L/min  Delayed enhancement images with gadolinium showed normal nulling and no uptake No evidence of amyloid  Parametric measures  T1 1007 msec normal  ECV 34% normal  T2 44 msec normal  IMPRESSION: 1.  Severe bi atrial enlargement no LAA thrombus  2.  Normal LV size and function LVEF 52%  3.  Mild RVE with low normal function RVEF 48%  4. Delayed enhancement images with normal nulling and no gadolinium uptake No evidence of amyloid  5.  Moderate appearing atrial functional  MR  6.  Normal parametric measures  7.  Normal cardiac output 6.3 L/min  Charlton Haws   Electronically Signed By: Charlton Haws M.D. On: 11/16/2022 15:42  PYP SCAN  MYOCARDIAL AMYLOID PLANAR AND SPECT 08/24/2022  Narrative   By semi-quantitative assessment scan is consistent with increased heart uptake but less than rib uptake-Grade 1. Heart to contralateral lung ratio is between 1-1.5, indeterminate for amyloid.   Study is equivocal for TTR amyloidosis (visual score of 1/ratio between 1-1.5).   Prior study not available for comparison.  Equivocal study for TTR amyloid.  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  BNP tomorrow; may need to transition to PO tomorrow  Morbid Obesity - weight control will improve comorbidity control  As per primary/non-cardiac consultants - hyperthyroidism- likely a driver of RVR    For questions or updates, please contact [facility] Please consult [email or web address] for contact info under Cardiology/STEMI.      Riley Lam, MD FASE [facility] Cardiologist [facility]  [address] [place], [address] [place], [place] [address] [phone]  8:18 AM

## 2023-09-20 NOTE — Assessment & Plan Note (Addendum)
 Patient required direct current cardioversion, converting to sinus rhythm.  Continue rate control with metoprolol 25 mg succinate.  Patient on dabigatran for anticoagulation.

## 2023-09-20 NOTE — Assessment & Plan Note (Addendum)
 TSH less than 0,010 With free T4 3,26 Total T3 152  Thyroglobulin antibody <0.10 Thyroid stimulating stimulating antibody 1.60 (positive).  Thyroid US with 2,4 cm ill defined solid nodule in the inferior left thyroid lobe meets criteria for 1 year follow up ultrasound.  This may be a pseudo nodule based on appearance.   Continue methimazole.  Follow up thyroid function testing in 2 to 3 weeks.

## 2023-09-20 NOTE — Assessment & Plan Note (Addendum)
 Hypokalemia.   At the time of discharge her renal function is more stable with serum cr at 1,2 with K at 4.0 and serum bicarbonate 29  Na 140 Mg 2.3   Continue torsemide 40 mg bid, and SGLT 2 inh.  Low dose K supplementation and follow up renal function and electrolytes as outpatient.

## 2023-09-20 NOTE — Assessment & Plan Note (Signed)
Calculated BMI is 43.2

## 2023-09-20 NOTE — Assessment & Plan Note (Addendum)
 Echocardiogram with preserved LV systolic function with EF 65 to 70%, RV systolic function preserved, RV with severe enlargement, RVSP 46,1 mmHg, LA with moderate dilatation, RA with severe dilatation, moderate MR, severe TR.  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   Patient was placed on IV furosemide for diuresis, negative fluid balance was achieved, - 12,045 ml, with significant improvement in her symptoms.   Patient will continue heart failure management with metoprolol succinate, and SGLT 2 inh.  Holding on RAAS inhibition until renal function more stable and to avoid hypotension.  Diuresis with torsemide Follow up with Cardiology as outpatient, may need right heart catheterization.

## 2023-09-21 DIAGNOSIS — E0590 Thyrotoxicosis, unspecified without thyrotoxic crisis or storm: Secondary | ICD-10-CM | POA: Diagnosis not present

## 2023-09-21 DIAGNOSIS — I4819 Other persistent atrial fibrillation: Secondary | ICD-10-CM | POA: Diagnosis not present

## 2023-09-21 DIAGNOSIS — I5033 Acute on chronic diastolic (congestive) heart failure: Secondary | ICD-10-CM | POA: Diagnosis not present

## 2023-09-21 DIAGNOSIS — N179 Acute kidney failure, unspecified: Secondary | ICD-10-CM | POA: Diagnosis not present

## 2023-09-21 LAB — BASIC METABOLIC PANEL
Anion gap: 10 (ref 5–15)
BUN: 29 mg/dL — ABNORMAL HIGH (ref 8–23)
CO2: 30 mmol/L (ref 22–32)
Calcium: 9 mg/dL (ref 8.9–10.3)
Chloride: 100 mmol/L (ref 98–111)
Creatinine, Ser: 1.13 mg/dL — ABNORMAL HIGH (ref 0.44–1.00)
GFR, Estimated: 50 mL/min — ABNORMAL LOW (ref >60)
Glucose, Bld: 119 mg/dL — ABNORMAL HIGH (ref 70–99)
Potassium: 3.7 mmol/L (ref 3.5–5.1)
Sodium: 140 mmol/L (ref 135–145)

## 2023-09-21 LAB — MAGNESIUM: Magnesium: 2.3 mg/dL (ref 1.7–2.4)

## 2023-09-21 MED ORDER — SODIUM CHLORIDE 0.9% FLUSH
3.0000 mL | Freq: Two times a day (BID) | INTRAVENOUS | Status: DC
  Administered 2023-09-21 – 2023-09-22 (×2): 3 mL via INTRAVENOUS

## 2023-09-21 MED ORDER — SODIUM CHLORIDE 0.9% FLUSH: 3.0000 mL | INTRAVENOUS | Status: DC | PRN

## 2023-09-21 NOTE — Progress Notes (Signed)
   09/21/23 1706  Spiritual Encounters  Type of Visit Follow up  Care provided to: Patient  Reason for visit Advance directives  OnCall Visit No   Visited with patient and completed HCPOA.

## 2023-09-21 NOTE — Progress Notes (Signed)
 Progress Note  Patient Name: Julia Mcfarland Date of Encounter: 09/21/2023 Primary Cardiologist: Thurmon Fair, MD   Subjective   Patient has diuresed over 29 lbs.  Still have LE edema.  Vital Signs    Vitals:   09/20/23 1625 09/20/23 2020 09/21/23 0012 09/21/23 0412  BP:  95/68 94/67 (!) 99/58  Pulse:  (!) 106 (!) 106 (!) 113  Resp:  18 18 18   Temp: 98.2 F (36.8 C) 98.4 F (36.9 C) 98.3 F (36.8 C) 98.5 F (36.9 C)  TempSrc:  Oral Oral Oral  SpO2:  100% 96% 99%  Weight:    106.9 kg  Height:        Intake/Output Summary (Last 24 hours) at 09/21/2023 0755 Last data filed at 09/21/2023 0014 Gross per 24 hour  Intake 532 ml  Output 600 ml  Net -68 ml   Filed Weights   09/19/23 0511 09/20/23 0549 09/21/23 0412  Weight: 109.9 kg 107.2 kg 106.9 kg    Physical Exam   GEN: No acute distress.  Morbid obesity Neck: + JVD Cardiac: IRIR tachycardia, holosystolic murmur Respiratory: CTAB normal rate GI: Soft, nontender, non-distended  MS: s/p UNNA Boot; non pitting edema improved but persists  Labs   Telemetry: AF- rates ~ 100 at rates ~ 125 with activity   Chemistry Recent Labs  Lab 09/14/23 1144 09/15/23 0308 09/19/23 0251 09/20/23 0218 09/21/23 0220  NA 141   < > 139 139 140  K 4.3   < > 3.8 3.4* 3.7  CL 106   < > 98 96* 100  CO2 20*   < > 29 30 30   GLUCOSE 213*   < > 110* 118* 119*  BUN 16   < > 25* 30* 29*  CREATININE 1.02*   < > 1.11* 1.29* 1.13*  CALCIUM 8.6*   < > 9.0 8.8* 9.0  PROT 6.2*  --   --   --   --   ALBUMIN 3.0*  --   --   --   --   AST 43*  --   --   --   --   ALT 21  --   --   --   --   ALKPHOS 97  --   --   --   --   BILITOT 1.9*  --   --   --   --   GFRNONAA 57*   < > 52* 43* 50*  ANIONGAP 15   < > 12 13 10    < > = values in this interval not displayed.     Hematology Recent Labs  Lab 09/14/23 1144 09/18/23 0331  WBC 4.7 4.4  RBC 4.11 4.11  HGB 12.8 12.5  HCT 39.5 38.2  MCV 96.1 92.9  MCH 31.1 30.4  MCHC 32.4 32.7   RDW 14.8 14.2  PLT 168 176    BNP No results for input(s): "BNP", "PROBNP" in the last 168 hours.     Cardiac Studies   Cardiac Studies & Procedures   ______________________________________________________________________________________________     ECHOCARDIOGRAM  ECHOCARDIOGRAM COMPLETE 09/14/2023  Narrative ECHOCARDIOGRAM REPORT    Patient Name:   YAMILE ROEDL Date of Exam: 09/14/2023 Medical Rec #:  [id number]      Height:       62.0 in Accession #:    [id number]     Weight:       238.1 lb Date of Birth:  1945/07/22      BSA:  2.059 m Patient Age:    77 years       BP:           99/67 mmHg Patient Gender: F              HR:           123 bpm. Exam Location:  Inpatient  Procedure: 2D Echo, Color Doppler and Cardiac Doppler (Both Spectral and Color Flow Doppler were utilized during procedure).  Indications:     CHF  History:         Patient has prior history of Echocardiogram examinations. CHF; Arrythmias:Atrial Fibrillation.  Sonographer:     Lamont Snowball Referring Phys:  [id number] Eduard Clos Diagnosing Phys: Armanda Magic MD  IMPRESSIONS   1. Left ventricular ejection fraction, by estimation, is 65 to 70%. The left ventricle has normal function. The left ventricle has no regional wall motion abnormalities. Left ventricular diastolic function could not be evaluated. 2. Right ventricular systolic function is normal. The right ventricular size is severely enlarged. There is moderately elevated pulmonary artery systolic pressure. The estimated right ventricular systolic pressure is 46.1 mmHg. 3. Left atrial size was moderately dilated. 4. Right atrial size was severely dilated. 5. The mitral valve is normal in structure. Moderate mitral valve regurgitation. No evidence of mitral stenosis. 6. 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 7. The aortic valve is normal in structure. Aortic valve regurgitation is not visualized. No aortic stenosis is  present. 8. The inferior vena cava is dilated in size with <50% respiratory variability, suggesting right atrial pressure of 15 mmHg.  FINDINGS Left Ventricle: Left ventricular ejection fraction, by estimation, is 65 to 70%. The left ventricle has normal function. The left ventricle has no regional wall motion abnormalities. The left ventricular internal cavity size was normal in size. There is no left ventricular hypertrophy. Left ventricular diastolic function could not be evaluated due to atrial fibrillation. Left ventricular diastolic function could not be evaluated.  Right Ventricle: The right ventricular size is severely enlarged. No increase in right ventricular wall thickness. Right ventricular systolic function is normal. There is moderately elevated pulmonary artery systolic pressure. The tricuspid regurgitant velocity is 2.79 m/s, and with an assumed right atrial pressure of 15 mmHg, the estimated right ventricular systolic pressure is 46.1 mmHg.  Left Atrium: Left atrial size was moderately dilated.  Right Atrium: Right atrial size was severely dilated.  Pericardium: There is no evidence of pericardial effusion.  Mitral Valve: The mitral valve is normal in structure. Mild mitral annular calcification. Moderate mitral valve regurgitation, with eccentric posteriorly directed jet. No evidence of mitral valve stenosis. MV peak gradient, 6.2 mmHg. The mean mitral valve gradient is 3.3 mmHg.  Tricuspid Valve: The tricuspid valve is normal in structure. 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 No evidence of tricuspid stenosis.  Aortic Valve: The aortic valve is normal in structure. Aortic valve regurgitation is not visualized. No aortic stenosis is present. Aortic valve peak gradient measures 13.7 mmHg.  Pulmonic Valve: The pulmonic valve was normal in structure. Pulmonic valve regurgitation is not visualized. No evidence of pulmonic stenosis.  Aorta: The aortic root is normal in size  and structure.  Venous: The inferior vena cava is dilated in size with less than 50% respiratory variability, suggesting right atrial pressure of 15 mmHg.  IAS/Shunts: No atrial level shunt detected by color flow Doppler.   LEFT VENTRICLE PLAX 2D LVIDd:         4.00 cm LVIDs:  3.10 cm LV PW:         0.90 cm LV IVS:        0.70 cm LVOT diam:     2.10 cm LV SV:         67 LV SV Index:   33 LVOT Area:     3.46 cm   RIGHT VENTRICLE          IVC RV Basal diam:  5.60 cm  IVC diam: 2.90 cm  LEFT ATRIUM             Index        RIGHT ATRIUM           Index LA Vol (A2C):   94.5 ml 45.90 ml/m  RA Area:     26.50 cm LA Vol (A4C):   76.5 ml 37.16 ml/m  RA Volume:   88.90 ml  43.18 ml/m LA Biplane Vol: 91.8 ml 44.59 ml/m AORTIC VALVE AV Area (Vmax): 2.92 cm AV Vmax:        185.33 cm/s AV Peak Grad:   13.7 mmHg LVOT Vmax:      156.25 cm/s LVOT Vmean:     104.850 cm/s LVOT VTI:       0.194 m  AORTA Ao Root diam: 2.90 cm Ao Asc diam:  3.20 cm  MITRAL VALVE                TRICUSPID VALVE MV Area (PHT): 5.51 cm     TR Peak grad:   31.1 mmHg MV Area VTI:   3.84 cm     TR Vmax:        279.00 cm/s MV Peak grad:  6.2 mmHg MV Mean grad:  3.3 mmHg     SHUNTS MV Vmax:       1.25 m/s     Systemic VTI:  0.19 m MV Vmean:      84.1 cm/s    Systemic Diam: 2.10 cm MV Decel Time: 138 msec MR Peak grad: 97.6 mmHg MR Vmax:      494.00 cm/s MV E velocity: 105.67 cm/s  Armanda Magic MD Electronically signed by Armanda Magic MD Signature Date/Time: 09/14/2023/10:48:47 AM    Final (Updated)    MONITORS  LONG TERM MONITOR (3-14 DAYS) 03/11/2020  Narrative  The dominant rhythm is normal sinus rhythm with normal circadian variation.  There are no episodes of significant bradycardia.  Episode of paroxysmal atrial fibrillation lasting more than 2 days was recorded, with poor ventricular rate control (average ventricular rate of 136 bpm)  Also seen are very brief episodes of  nonsustained atrial tachycardia and a single 6 beat episode of nonsustained ventricular tachycardia.  Abnormal event monitor due to the presence of paroxysmal atrial fibrillation with rapid ventricular response.     CARDIAC MRI  MR CARDIAC MORPHOLOGY W WO CONTRAST 11/16/2022  Narrative CLINICAL DATA:  R/O Amyloid  EXAM: CARDIAC MRI  TECHNIQUE: The patient was scanned on a 1.5 Tesla Siemens magnet. A dedicated cardiac coil was used. Functional imaging was done using Fiesta sequences. 2,3, and 4 chamber views were done to assess for RWMA's. Modified Simpson's rule using a short axis stack was used to calculate an ejection fraction on a dedicated work [employer]. The patient received 10 cc of Gadavist. After 10 minutes inversion recovery sequences were used to assess for infiltration and scar tissue.  CONTRAST:  [id number]  FINDINGS: Severe bi atrial enlargement No LAA thrombus. No ASD/PFO No pericardial effusion  Normal IVC diameter 1.6 cm Normal ascending thoracic aorta 3.4 cm. Tri leaflet AV Atrial functional MR appears at least moderate on true FISP images Normal TV/PV.  Normal LV size and function Quantitative EF 52% (EDV 155 ESV 74 SV 81 cc )  Mildly dilated RV with low normal function RVEF 48% (EDV 210 cc ESV 109 cc SV 101 cc )  Estimated cardiac output based on flow velocity through the aortic valve annulus 6.3 L/min  Delayed enhancement images with gadolinium showed normal nulling and no uptake No evidence of amyloid  Parametric measures  T1 1007 msec normal  ECV 34% normal  T2 44 msec normal  IMPRESSION: 1.  Severe bi atrial enlargement no LAA thrombus  2.  Normal LV size and function LVEF 52%  3.  Mild RVE with low normal function RVEF 48%  4. Delayed enhancement images with normal nulling and no gadolinium uptake No evidence of amyloid  5.  Moderate appearing atrial functional MR  6.  Normal parametric measures  7.  Normal  cardiac output 6.3 L/min  Charlton Haws   Electronically Signed By: Charlton Haws M.D. On: 11/16/2022 15:42  PYP SCAN  MYOCARDIAL AMYLOID PLANAR AND SPECT 08/24/2022  Narrative   By semi-quantitative assessment scan is consistent with increased heart uptake but less than rib uptake-Grade 1. Heart to contralateral lung ratio is between 1-1.5, indeterminate for amyloid.   Study is equivocal for TTR amyloidosis (visual score of 1/ratio between 1-1.5).   Prior study not available for comparison.  Equivocal study for TTR amyloid.  Cardiac MRI recommended  ______________________________________________________________________________________________      Assessment & Plan   Acute on Chronic HF (preserved EF) NYHA II, Stage C, hypervolemic - continue lasix 100 mg IV BID we have not increased due to marginal BP - continue SGLT2i - continue UNNA boot - no MRA (BP)  Severe TR Moderate MR - active function patient with sx despite diuresis - TEE tomorrow  PAF - on highest tolerated BB (BP) - elevated heart rates with ambulation; rate control strategy will lead to higher rates despite tx of her hyperthyroidism - continue home pradaxa  After careful review of history and examination, the risks and benefits of transesophageal echocardiogram and cardioversion have been explained including risks of esophageal damage, perforation (1:10,000 risk), bleeding, pharyngeal hematoma as well as other potential complications associated with conscious sedation including aspiration, arrhythmia, respiratory failure, stroke  and death.   Alternatives to treatment were discussed, questions were answered. Patient is willing to proceed.   Procedural considerations patient would only need DCCV if not for valve evaluation- suspect severe functional tricuspid regurgitation despite ~ 30 lbs diuresis   AKI Hypokalemia - Improving with decongestion and repletion  Morbid Obesity - weight control will  improve comorbidity control - - 30 lbs  As per primary/non-cardiac consultants - hyperthyroidism    For questions or updates, please contact [facility] Please consult [email or web address] for contact info under Cardiology/STEMI.      Riley Lam, MD FASE [facility] Cardiologist [facility]  [address] [place], [address] [place], [place] [address] [phone]  7:55 AM

## 2023-09-21 NOTE — Progress Notes (Addendum)
 Progress Note   [name] NWG:[id number] DOB: 12/31/1945 DOA: 09/13/2023     7 DOS: the patient was seen and examined on 09/21/2023   Brief hospital course: Mrs. [name] was admitted to the hospital with the working diagnosis of heart failure exacerbation in the setting of atrial fibrillation with RVR.   78 year old F with PMH of diastolic CHF, PAH, sev TR, mod-sev MR, PAF, obesity class 3, and chronic lower extremity edema presented to ED with worsening dyspnea on exertion and lower extremity edema despite increased dose of Lasix outpatient.  In ED, in A-fib with RVR with HR to 120s.  Basic labs without significant finding.  Troponin negative.  EKG features A-fib with RVR.  BNP elevated to 360.  No significant finding on CXR.  Patient was started on IV Lasix and Toprol-XL and admitted for further evaluation and management.  Echocardiogram ordered.  Cardiology consulted.   TTE with LVEF of 65 to 70%, indeterminate DD, RVSP of 46, mod LAE, sev RAE, mod MR and sev TR. Thyroid panel suggested significant hyperthyroidism.  Remains on IV Lasix.  Cardiology following.  On home Toprol-XL for A-fib.   Thyroid panel with undetectable TSH and elevated free T4 to 3.28.  Total T3 normal.  Thyroid US with 2.4 cm ill-defined solid nodule in the inferior left thyroid lobe and another smaller nodule.  Started on methimazole.   03/20 volume status is improving, she is having intermittent hypotension, plan for TEE cardioversion tomorrow.   Assessment and Plan: * Acute on chronic diastolic CHF (congestive heart failure) (HCC) Echocardiogram with preserved LV systolic function with EF 65 to 70%, RV systolic function preserved, RV with severe enlargement, RVSP 46,1 mmHg, LA with moderate dilatation, RA with severe dilatation, moderate MR, severe TR.  Urine output is ,600 ml Systolic blood pressure 95 to 578 mmHg.   Furosemide were delayed due to hypotension but yesterday received 2 doses.  Metoprolol  only received one dose yesterday due to hypotension.   Plan to continue SGLT 2 inh.   Persistent atrial fibrillation with RVR (HCC) Continue metoprolol for rate control.  Patient on dabigatran for anticoagulation.  Plan for TEE cardioversion tomorrow  Primary hyperthyroidism Continue methimazole.   AKI (acute kidney injury) (HCC) Hypokalemia.   Renal function with serum cr at 1,1 with K at 3,7 and serum bicarbonate at 30  Na 140 and Mg 2.3   Continue kcl 40 meq to keep K at 4  Follow up renal function in am. Continue diuresis with furosemide and SGLT 2 inh.   Obesity, class 3 Calculated BMI is 43.2       Subjective: Patient is feeling better, she is out of bed to the chair, her edema is improving along with her dyspnea, but not back to baseline.   Physical Exam: Vitals:   09/21/23 0823 09/21/23 0959 09/21/23 1121 09/21/23 1408  BP: (!) 101/57 (!) 85/47 98/73 94/62   Pulse: (!) 109 94 95 75  Resp: 19 18 18 18   Temp: 98.6 F (37 C)  98.4 F (36.9 C)   TempSrc: Oral  Oral   SpO2: 96% 95% 98% 98%  Weight:      Height:       Neurology awake and alert ENT with mild pallor  Cardiovascular with S1 and S2 present, irregularly irregular with no gallops or rubs, positive systolic murmur at the apex Respiratory with mild rales at bases with no wheezing or rhonchi Abdomen with no distention  Data Reviewed:  Family Communication: no family at the bedside  I spoke with patient's daughter over the phone, we talked in detail about patient's condition, plan of care and prognosis and all questions were addressed.   Disposition: Status is: Inpatient Remains inpatient appropriate because: heart failure   Planned Discharge Destination: Home    Author: Coralie Keens, MD 09/21/2023 3:17 PM  For on call review [email or web address].

## 2023-09-21 NOTE — Progress Notes (Signed)
 Mobility Specialist Progress Note:    09/21/23 1128  Mobility  Activity Ambulated with assistance in hallway  Level of Assistance Standby assist, set-up cues, supervision of patient - no hands on  Assistive Device Cane  Distance Ambulated (ft) 420 ft  Activity Response Tolerated well  Mobility Referral Yes  Mobility visit 1 Mobility  Mobility Specialist Start Time (ACUTE ONLY) 0910  Mobility Specialist Stop Time (ACUTE ONLY) 0925  Mobility Specialist Time Calculation (min) (ACUTE ONLY) 15 min   Received pt in bed having no complaints and agreeable to mobility. Pt was asymptomatic throughout ambulation and returned to room w/o fault. Left in chair w/ call bell in reach and all needs met.   Thompson Grayer Mobility Specialist  Please contact vis Secure Chat or  Rehab Office [phone]

## 2023-09-21 NOTE — Care Management Important Message (Signed)
 Important Message  Patient Details  Name: Julia Mcfarland MRN: [id number] Date of Birth: [birth year]   Important Message Given:  Yes - Medicare IM     Renie Ora 09/21/2023, 1:17 PM

## 2023-09-21 NOTE — Progress Notes (Signed)
 Physical Therapy Treatment Patient Details Name: Julia Mcfarland MRN: [id number] DOB: Apr 14, 1946 Today's Date: 09/21/2023   History of Present Illness Pt is a 78 yo female presenting to [facility] on 09/13/23 with worsening BLE edema and SOB. PMH of CHF, afib, morbid obesity, OA, DJD, L TKA.    PT Comments  Session focused on gait training to promote safe navigation around obstacles in the home/community. Pt displayed ability to squat to floor, perform dynamic gait challenges, and step over obstacles safely and with fewer deviations and continues to display a close proximity to functional baseline. Pt education regarding AD use, energy conservation, functional mobility, and ambulation were provided to which the pt responded with verbal understanding. Given pt's proximity to baseline, PLOF, and current functional mobility pt doesn't require continued acute PT and should continue mobility with nurses and mobility specialists until d/c. Will sign off acutely. All pt education provided and all questions answered.         If plan is discharge home, recommend the following: A little help with walking and/or transfers;A little help with bathing/dressing/bathroom;Assistance with cooking/housework;Assist for transportation;Help with stairs or ramp for entrance   Can travel by private vehicle        Equipment Recommendations  Cane    Recommendations for Other Services       Precautions / Restrictions Precautions Precautions: Fall Precaution/Restrictions Comments: low risk for falls Restrictions Weight Bearing Restrictions Per Provider Order: No     Mobility  Bed Mobility               General bed mobility comments: Pt sitting up in recliner upon arrival and at end of session.    Transfers Overall transfer level: Independent Equipment used: None Transfers: Sit to/from Stand Sit to Stand: Independent           General transfer comment: pt transfers from recliner without AD, doesn't  require increased time or assistance    Ambulation/Gait Ambulation/Gait assistance: Modified independent (Device/Increase time), Supervision Gait Distance (Feet): 25 Feet (3 bouts, 1st 5 ft to sink counter, 2nd 3 ft to bed; 3rd 22 ft obstacle course) Assistive device: None Gait Pattern/deviations: Step-through pattern, Trendelenburg, Wide base of support, Trunk flexed Gait velocity: reduced Gait velocity interpretation: >2.62 ft/sec, indicative of community ambulatory   General Gait Details: pt has bil Trendelenburg gait, R>L.   Stairs             Wheelchair Mobility     Tilt Bed    Modified Rankin (Stroke Patients Only)       Balance Overall balance assessment: Independent                                          Communication Communication Communication: No apparent difficulties  Cognition Arousal: Alert Behavior During Therapy: WFL for tasks assessed/performed   PT - Cognitive impairments: No apparent impairments                         Following commands: Intact      Cueing Cueing Techniques: Verbal cues  Exercises Other Exercises Other Exercises: semi-tandem stance x45" bil; at room's sink, no UE    General Comments        Pertinent Vitals/Pain Pain Assessment Pain Assessment: No/denies pain    Home Living  Prior Function            PT Goals (current goals can now be found in the care plan section) Acute Rehab PT Goals Patient Stated Goal: return home PT Goal Formulation: All assessment and education complete, DC therapy Time For Goal Achievement: 09/30/23 Potential to Achieve Goals: Good Additional Goals Additional Goal #1:  (met 3/20) Progress towards PT goals: Progressing toward goals;Goals met/education completed, patient discharged from PT    Frequency    Min 2X/week      PT Plan      Co-evaluation              AM-PAC PT "6 Clicks" Mobility    Outcome Measure  Help needed turning from your back to your side while in a flat bed without using bedrails?: None Help needed moving from lying on your back to sitting on the side of a flat bed without using bedrails?: None Help needed moving to and from a bed to a chair (including a wheelchair)?: None Help needed standing up from a chair using your arms (e.g., wheelchair or bedside chair)?: None Help needed to walk in hospital room?: None Help needed climbing 3-5 steps with a railing? : A Little 6 Click Score: 23    End of Session Equipment Utilized During Treatment: Gait belt Activity Tolerance: Patient tolerated treatment well Patient left: in chair;with call bell/phone within reach Nurse Communication: Mobility status;Other (comment) (nurse reported pt doesn't need active bed alarm) PT Visit Diagnosis: Unsteadiness on feet (R26.81);Other abnormalities of gait and mobility (R26.89);Muscle weakness (generalized) (M62.81);Difficulty in walking, not elsewhere classified (R26.2)     Time: 1610-9604 PT Time Calculation (min) (ACUTE ONLY): 20 min  Charges:    $Gait Training: 8-22 mins PT General Charges $$ ACUTE PT VISIT: 1 Visit                    [address], SPT    [place] 09/21/2023, 12:22 PM

## 2023-09-21 NOTE — H&P (View-Only) (Signed)
 Progress Note  Patient Name: Julia Mcfarland Date of Encounter: 09/21/2023 Primary Cardiologist: Thurmon Fair, MD   Subjective   Patient has diuresed over 29 lbs.  Still have LE edema.  Vital Signs    Vitals:   09/20/23 1625 09/20/23 2020 09/21/23 0012 09/21/23 0412  BP:  95/68 94/67 (!) 99/58  Pulse:  (!) 106 (!) 106 (!) 113  Resp:  18 18 18   Temp: 98.2 F (36.8 C) 98.4 F (36.9 C) 98.3 F (36.8 C) 98.5 F (36.9 C)  TempSrc:  Oral Oral Oral  SpO2:  100% 96% 99%  Weight:    106.9 kg  Height:        Intake/Output Summary (Last 24 hours) at 09/21/2023 0755 Last data filed at 09/21/2023 0014 Gross per 24 hour  Intake 532 ml  Output 600 ml  Net -68 ml   Filed Weights   09/19/23 0511 09/20/23 0549 09/21/23 0412  Weight: 109.9 kg 107.2 kg 106.9 kg    Physical Exam   GEN: No acute distress.  Morbid obesity Neck: + JVD Cardiac: IRIR tachycardia, holosystolic murmur Respiratory: CTAB normal rate GI: Soft, nontender, non-distended  MS: s/p UNNA Boot; non pitting edema improved but persists  Labs   Telemetry: AF- rates ~ 100 at rates ~ 125 with activity   Chemistry Recent Labs  Lab 09/14/23 1144 09/15/23 0308 09/19/23 0251 09/20/23 0218 09/21/23 0220  NA 141   < > 139 139 140  K 4.3   < > 3.8 3.4* 3.7  CL 106   < > 98 96* 100  CO2 20*   < > 29 30 30   GLUCOSE 213*   < > 110* 118* 119*  BUN 16   < > 25* 30* 29*  CREATININE 1.02*   < > 1.11* 1.29* 1.13*  CALCIUM 8.6*   < > 9.0 8.8* 9.0  PROT 6.2*  --   --   --   --   ALBUMIN 3.0*  --   --   --   --   AST 43*  --   --   --   --   ALT 21  --   --   --   --   ALKPHOS 97  --   --   --   --   BILITOT 1.9*  --   --   --   --   GFRNONAA 57*   < > 52* 43* 50*  ANIONGAP 15   < > 12 13 10    < > = values in this interval not displayed.     Hematology Recent Labs  Lab 09/14/23 1144 09/18/23 0331  WBC 4.7 4.4  RBC 4.11 4.11  HGB 12.8 12.5  HCT 39.5 38.2  MCV 96.1 92.9  MCH 31.1 30.4  MCHC 32.4 32.7   RDW 14.8 14.2  PLT 168 176    BNP No results for input(s): "BNP", "PROBNP" in the last 168 hours.     Cardiac Studies   Cardiac Studies & Procedures   ______________________________________________________________________________________________     ECHOCARDIOGRAM  ECHOCARDIOGRAM COMPLETE 09/14/2023  Narrative ECHOCARDIOGRAM REPORT    Patient Name:   Julia Mcfarland Date of Exam: 09/14/2023 Medical Rec #:  [id number]      Height:       62.0 in Accession #:    [id number]     Weight:       238.1 lb Date of Birth:  1945/07/22      BSA:  2.059 m Patient Age:    77 years       BP:           99/67 mmHg Patient Gender: F              HR:           123 bpm. Exam Location:  Inpatient  Procedure: 2D Echo, Color Doppler and Cardiac Doppler (Both Spectral and Color Flow Doppler were utilized during procedure).  Indications:     CHF  History:         Patient has prior history of Echocardiogram examinations. CHF; Arrythmias:Atrial Fibrillation.  Sonographer:     Lamont Snowball Referring Phys:  [id number] Eduard Clos Diagnosing Phys: Armanda Magic MD  IMPRESSIONS   1. Left ventricular ejection fraction, by estimation, is 65 to 70%. The left ventricle has normal function. The left ventricle has no regional wall motion abnormalities. Left ventricular diastolic function could not be evaluated. 2. Right ventricular systolic function is normal. The right ventricular size is severely enlarged. There is moderately elevated pulmonary artery systolic pressure. The estimated right ventricular systolic pressure is 46.1 mmHg. 3. Left atrial size was moderately dilated. 4. Right atrial size was severely dilated. 5. The mitral valve is normal in structure. Moderate mitral valve regurgitation. No evidence of mitral stenosis. 6. 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 7. The aortic valve is normal in structure. Aortic valve regurgitation is not visualized. No aortic stenosis is  present. 8. The inferior vena cava is dilated in size with <50% respiratory variability, suggesting right atrial pressure of 15 mmHg.  FINDINGS Left Ventricle: Left ventricular ejection fraction, by estimation, is 65 to 70%. The left ventricle has normal function. The left ventricle has no regional wall motion abnormalities. The left ventricular internal cavity size was normal in size. There is no left ventricular hypertrophy. Left ventricular diastolic function could not be evaluated due to atrial fibrillation. Left ventricular diastolic function could not be evaluated.  Right Ventricle: The right ventricular size is severely enlarged. No increase in right ventricular wall thickness. Right ventricular systolic function is normal. There is moderately elevated pulmonary artery systolic pressure. The tricuspid regurgitant velocity is 2.79 m/s, and with an assumed right atrial pressure of 15 mmHg, the estimated right ventricular systolic pressure is 46.1 mmHg.  Left Atrium: Left atrial size was moderately dilated.  Right Atrium: Right atrial size was severely dilated.  Pericardium: There is no evidence of pericardial effusion.  Mitral Valve: The mitral valve is normal in structure. Mild mitral annular calcification. Moderate mitral valve regurgitation, with eccentric posteriorly directed jet. No evidence of mitral valve stenosis. MV peak gradient, 6.2 mmHg. The mean mitral valve gradient is 3.3 mmHg.  Tricuspid Valve: The tricuspid valve is normal in structure. 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 No evidence of tricuspid stenosis.  Aortic Valve: The aortic valve is normal in structure. Aortic valve regurgitation is not visualized. No aortic stenosis is present. Aortic valve peak gradient measures 13.7 mmHg.  Pulmonic Valve: The pulmonic valve was normal in structure. Pulmonic valve regurgitation is not visualized. No evidence of pulmonic stenosis.  Aorta: The aortic root is normal in size  and structure.  Venous: The inferior vena cava is dilated in size with less than 50% respiratory variability, suggesting right atrial pressure of 15 mmHg.  IAS/Shunts: No atrial level shunt detected by color flow Doppler.   LEFT VENTRICLE PLAX 2D LVIDd:         4.00 cm LVIDs:  3.10 cm LV PW:         0.90 cm LV IVS:        0.70 cm LVOT diam:     2.10 cm LV SV:         67 LV SV Index:   33 LVOT Area:     3.46 cm   RIGHT VENTRICLE          IVC RV Basal diam:  5.60 cm  IVC diam: 2.90 cm  LEFT ATRIUM             Index        RIGHT ATRIUM           Index LA Vol (A2C):   94.5 ml 45.90 ml/m  RA Area:     26.50 cm LA Vol (A4C):   76.5 ml 37.16 ml/m  RA Volume:   88.90 ml  43.18 ml/m LA Biplane Vol: 91.8 ml 44.59 ml/m AORTIC VALVE AV Area (Vmax): 2.92 cm AV Vmax:        185.33 cm/s AV Peak Grad:   13.7 mmHg LVOT Vmax:      156.25 cm/s LVOT Vmean:     104.850 cm/s LVOT VTI:       0.194 m  AORTA Ao Root diam: 2.90 cm Ao Asc diam:  3.20 cm  MITRAL VALVE                TRICUSPID VALVE MV Area (PHT): 5.51 cm     TR Peak grad:   31.1 mmHg MV Area VTI:   3.84 cm     TR Vmax:        279.00 cm/s MV Peak grad:  6.2 mmHg MV Mean grad:  3.3 mmHg     SHUNTS MV Vmax:       1.25 m/s     Systemic VTI:  0.19 m MV Vmean:      84.1 cm/s    Systemic Diam: 2.10 cm MV Decel Time: 138 msec MR Peak grad: 97.6 mmHg MR Vmax:      494.00 cm/s MV E velocity: 105.67 cm/s  Armanda Magic MD Electronically signed by Armanda Magic MD Signature Date/Time: 09/14/2023/10:48:47 AM    Final (Updated)    MONITORS  LONG TERM MONITOR (3-14 DAYS) 03/11/2020  Narrative  The dominant rhythm is normal sinus rhythm with normal circadian variation.  There are no episodes of significant bradycardia.  Episode of paroxysmal atrial fibrillation lasting more than 2 days was recorded, with poor ventricular rate control (average ventricular rate of 136 bpm)  Also seen are very brief episodes of  nonsustained atrial tachycardia and a single 6 beat episode of nonsustained ventricular tachycardia.  Abnormal event monitor due to the presence of paroxysmal atrial fibrillation with rapid ventricular response.     CARDIAC MRI  MR CARDIAC MORPHOLOGY W WO CONTRAST 11/16/2022  Narrative CLINICAL DATA:  R/O Amyloid  EXAM: CARDIAC MRI  TECHNIQUE: The patient was scanned on a 1.5 Tesla Siemens magnet. A dedicated cardiac coil was used. Functional imaging was done using Fiesta sequences. 2,3, and 4 chamber views were done to assess for RWMA's. Modified Simpson's rule using a short axis stack was used to calculate an ejection fraction on a dedicated work [employer]. The patient received 10 cc of Gadavist. After 10 minutes inversion recovery sequences were used to assess for infiltration and scar tissue.  CONTRAST:  [id number]  FINDINGS: Severe bi atrial enlargement No LAA thrombus. No ASD/PFO No pericardial effusion  Normal IVC diameter 1.6 cm Normal ascending thoracic aorta 3.4 cm. Tri leaflet AV Atrial functional MR appears at least moderate on true FISP images Normal TV/PV.  Normal LV size and function Quantitative EF 52% (EDV 155 ESV 74 SV 81 cc )  Mildly dilated RV with low normal function RVEF 48% (EDV 210 cc ESV 109 cc SV 101 cc )  Estimated cardiac output based on flow velocity through the aortic valve annulus 6.3 L/min  Delayed enhancement images with gadolinium showed normal nulling and no uptake No evidence of amyloid  Parametric measures  T1 1007 msec normal  ECV 34% normal  T2 44 msec normal  IMPRESSION: 1.  Severe bi atrial enlargement no LAA thrombus  2.  Normal LV size and function LVEF 52%  3.  Mild RVE with low normal function RVEF 48%  4. Delayed enhancement images with normal nulling and no gadolinium uptake No evidence of amyloid  5.  Moderate appearing atrial functional MR  6.  Normal parametric measures  7.  Normal  cardiac output 6.3 L/min  Charlton Haws   Electronically Signed By: Charlton Haws M.D. On: 11/16/2022 15:42  PYP SCAN  MYOCARDIAL AMYLOID PLANAR AND SPECT 08/24/2022  Narrative   By semi-quantitative assessment scan is consistent with increased heart uptake but less than rib uptake-Grade 1. Heart to contralateral lung ratio is between 1-1.5, indeterminate for amyloid.   Study is equivocal for TTR amyloidosis (visual score of 1/ratio between 1-1.5).   Prior study not available for comparison.  Equivocal study for TTR amyloid.  Cardiac MRI recommended  ______________________________________________________________________________________________      Assessment & Plan   Acute on Chronic HF (preserved EF) NYHA II, Stage C, hypervolemic - continue lasix 100 mg IV BID we have not increased due to marginal BP - continue SGLT2i - continue UNNA boot - no MRA (BP)  Severe TR Moderate MR - active function patient with sx despite diuresis - TEE tomorrow  PAF - on highest tolerated BB (BP) - elevated heart rates with ambulation; rate control strategy will lead to higher rates despite tx of her hyperthyroidism - continue home pradaxa  After careful review of history and examination, the risks and benefits of transesophageal echocardiogram and cardioversion have been explained including risks of esophageal damage, perforation (1:10,000 risk), bleeding, pharyngeal hematoma as well as other potential complications associated with conscious sedation including aspiration, arrhythmia, respiratory failure, stroke  and death.   Alternatives to treatment were discussed, questions were answered. Patient is willing to proceed.   Procedural considerations patient would only need DCCV if not for valve evaluation- suspect severe functional tricuspid regurgitation despite ~ 30 lbs diuresis   AKI Hypokalemia - Improving with decongestion and repletion  Morbid Obesity - weight control will  improve comorbidity control - - 30 lbs  As per primary/non-cardiac consultants - hyperthyroidism    For questions or updates, please contact [facility] Please consult [email or web address] for contact info under Cardiology/STEMI.      Riley Lam, MD FASE [facility] Cardiologist [facility]  [address] [place], [address] [place], [place] [address] [phone]  7:55 AM

## 2023-09-22 ENCOUNTER — Encounter (HOSPITAL_COMMUNITY)
Admission: EM | Disposition: A | Discharge status: Home / Self Care | Payer: Self-pay | Source: Home / Self Care | Attending: Internal Medicine

## 2023-09-22 ENCOUNTER — Inpatient Hospital Stay (HOSPITAL_COMMUNITY): Admitting: Anesthesiology

## 2023-09-22 ENCOUNTER — Inpatient Hospital Stay (HOSPITAL_COMMUNITY)

## 2023-09-22 ENCOUNTER — Encounter (HOSPITAL_COMMUNITY): Payer: Self-pay | Admitting: Internal Medicine

## 2023-09-22 DIAGNOSIS — I2510 Atherosclerotic heart disease of native coronary artery without angina pectoris: Secondary | ICD-10-CM | POA: Diagnosis not present

## 2023-09-22 DIAGNOSIS — I5033 Acute on chronic diastolic (congestive) heart failure: Secondary | ICD-10-CM

## 2023-09-22 DIAGNOSIS — I340 Nonrheumatic mitral (valve) insufficiency: Secondary | ICD-10-CM | POA: Diagnosis not present

## 2023-09-22 DIAGNOSIS — N179 Acute kidney failure, unspecified: Secondary | ICD-10-CM | POA: Diagnosis not present

## 2023-09-22 DIAGNOSIS — I4891 Unspecified atrial fibrillation: Secondary | ICD-10-CM | POA: Diagnosis not present

## 2023-09-22 DIAGNOSIS — I4819 Other persistent atrial fibrillation: Secondary | ICD-10-CM | POA: Diagnosis not present

## 2023-09-22 DIAGNOSIS — E0590 Thyrotoxicosis, unspecified without thyrotoxic crisis or storm: Secondary | ICD-10-CM | POA: Diagnosis not present

## 2023-09-22 HISTORY — PX: CARDIOVERSION: EP1203

## 2023-09-22 HISTORY — PX: TRANSESOPHAGEAL ECHOCARDIOGRAM (CATH LAB): EP1270

## 2023-09-22 LAB — BASIC METABOLIC PANEL
Anion gap: 14 (ref 5–15)
BUN: 29 mg/dL — ABNORMAL HIGH (ref 8–23)
CO2: 27 mmol/L (ref 22–32)
Calcium: 9.1 mg/dL (ref 8.9–10.3)
Chloride: 100 mmol/L (ref 98–111)
Creatinine, Ser: 1.25 mg/dL — ABNORMAL HIGH (ref 0.44–1.00)
GFR, Estimated: 45 mL/min — ABNORMAL LOW (ref >60)
Glucose, Bld: 132 mg/dL — ABNORMAL HIGH (ref 70–99)
Potassium: 3.4 mmol/L — ABNORMAL LOW (ref 3.5–5.1)
Sodium: 141 mmol/L (ref 135–145)

## 2023-09-22 LAB — MAGNESIUM: Magnesium: 2.3 mg/dL (ref 1.7–2.4)

## 2023-09-22 SURGERY — TRANSESOPHAGEAL ECHOCARDIOGRAM (TEE) (CATHLAB)
Anesthesia: General

## 2023-09-22 MED ORDER — POTASSIUM CHLORIDE CRYS ER 20 MEQ PO TBCR
EXTENDED_RELEASE_TABLET | ORAL | Status: AC
Start: 2023-09-22 — End: 2023-09-22
  Filled 2023-09-22: qty 2

## 2023-09-22 MED ORDER — TORSEMIDE 20 MG PO TABS
40.0000 mg | ORAL_TABLET | Freq: Two times a day (BID) | ORAL | Status: DC
  Administered 2023-09-22 – 2023-09-23 (×2): 40 mg via ORAL
  Filled 2023-09-22 (×2): qty 2

## 2023-09-22 MED ORDER — PROPOFOL 500 MG/50ML IV EMUL
INTRAVENOUS | Status: DC | PRN
  Administered 2023-09-22: 150 ug/kg/min via INTRAVENOUS

## 2023-09-22 MED ORDER — SODIUM CHLORIDE 0.9 % IV SOLN: INTRAVENOUS | Status: DC | PRN

## 2023-09-22 MED ORDER — BUTAMBEN-TETRACAINE-BENZOCAINE 2-2-14 % EX AERO
INHALATION_SPRAY | CUTANEOUS | Status: AC
  Filled 2023-09-22: qty 20

## 2023-09-22 MED ORDER — POTASSIUM CHLORIDE CRYS ER 20 MEQ PO TBCR
40.0000 meq | EXTENDED_RELEASE_TABLET | ORAL | Status: AC
Start: 2023-09-22 — End: 2023-09-22
  Administered 2023-09-22: 40 meq via ORAL

## 2023-09-22 MED ORDER — PHENYLEPHRINE 80 MCG/ML (10ML) SYRINGE FOR IV PUSH (FOR BLOOD PRESSURE SUPPORT)
PREFILLED_SYRINGE | INTRAVENOUS | Status: DC | PRN
  Administered 2023-09-22: 160 ug via INTRAVENOUS

## 2023-09-22 MED ORDER — METOPROLOL SUCCINATE ER 25 MG PO TB24
25.0000 mg | ORAL_TABLET | Freq: Every day | ORAL | Status: DC
  Administered 2023-09-22: 25 mg via ORAL
  Filled 2023-09-22: qty 1

## 2023-09-22 MED ORDER — PHENYLEPHRINE HCL-NACL 20-0.9 MG/250ML-% IV SOLN
INTRAVENOUS | Status: DC | PRN
  Administered 2023-09-22: 20 ug/min via INTRAVENOUS

## 2023-09-22 MED ORDER — PROPOFOL 10 MG/ML IV BOLUS
INTRAVENOUS | Status: DC | PRN
  Administered 2023-09-22: 80 mg via INTRAVENOUS

## 2023-09-22 SURGICAL SUPPLY — 1 items: PAD DEFIB RADIO PHYSIO CONN (PAD) ×2 IMPLANT

## 2023-09-22 NOTE — CV Procedure (Signed)
 INDICATIONS: Afib, TR, MR  PROCEDURE:   Informed consent was obtained prior to the procedure. The risks, benefits and alternatives for the procedure were discussed and the patient comprehended these risks.  Risks include, but are not limited to, cough, sore throat, vomiting, nausea, somnolence, esophageal and stomach trauma or perforation, bleeding, low blood pressure, aspiration, pneumonia, infection, trauma to the teeth and death.    After a procedural time-out, the oropharynx was anesthetized with 20% benzocaine spray.   During this procedure the patient was administered propofol per anesthesia.  The patient's heart rate, blood pressure, and oxygen saturation were monitored continuously during the procedure. The period of conscious sedation was 30  minutes, of which I was present face-to-face 100% of this time.  The transesophageal probe was inserted in the esophagus and stomach without difficulty and multiple views were obtained.  The patient was kept under observation until the patient left the procedure room.  The patient left the procedure room in stable condition.   Agitated microbubble saline contrast was administered.  COMPLICATIONS:    There were no immediate complications.  FINDINGS:   FORMAL ECHOCARDIOGRAM REPORT PENDING Normal biventricular function lvef 60-65% RV dilation with annular dilation and severe RA dilation.  Severe functional TR secondary to annular dilation and atrial functional. Hepatic vein reversals in systole. Moderate MR, denegerative valve and commissural suspect atrial functional. No LAA thrombus.   RECOMMENDATIONS:     Proceed to DCCV.  Procedure: Electrical Cardioversion Indications:  Atrial Fibrillation  Procedure Details:  Consent: Risks of procedure as well as the alternatives and risks of each were explained to the (patient/caregiver).  Consent for procedure obtained. Prior to TEE.   Time Out: Verified patient identification, verified  procedure, site/side was marked, verified correct patient position, special equipment/implants available, medications/allergies/relevent history reviewed, required imaging and test results available. PERFORMED.  Patient placed on cardiac monitor, pulse oximetry, supplemental oxygen as necessary.  Sedation given:  propofol per anesthesia Pacer pads placed anterior chest.  Cardioverted 1 time(s).  Cardioversion with synchronized biphasic 200J shock.  Evaluation: Findings: Post procedure EKG shows:  sinus bradycardia Complications: None Patient did tolerate procedure well.  Time Spent Directly with the Patient:  45 minutes   Julia Mcfarland 09/22/2023, 9:23 AM  Consider repeat TTE in short interval follow up if she remains in sinus rhythm to reassess burden of regurgitation in SR. Likely will be unchanged given atrial functional mechanism.

## 2023-09-22 NOTE — Progress Notes (Signed)
 Progress Note   Patient: MORGAINE KIMBALL HQI:[id number] DOB: 05-21-1946 DOA: 09/13/2023     8 DOS: the patient was seen and examined on 09/22/2023   Brief hospital course: Mrs. [name] was admitted to the hospital with the working diagnosis of heart failure exacerbation in the setting of atrial fibrillation with RVR.   78 year old F with PMH of diastolic CHF, PAH, sev TR, mod-sev MR, PAF, obesity class 3, and chronic lower extremity edema presented to ED with worsening dyspnea on exertion and lower extremity edema despite increased dose of Lasix outpatient.  In ED, in A-fib with RVR with HR to 120s.  Basic labs without significant finding.  Troponin negative.  EKG features A-fib with RVR.  BNP elevated to 360.  No significant finding on CXR.  Patient was started on IV Lasix and Toprol-XL and admitted for further evaluation and management.  Echocardiogram ordered.  Cardiology consulted.   TTE with LVEF of 65 to 70%, indeterminate DD, RVSP of 46, mod LAE, sev RAE, mod MR and sev TR. Thyroid panel suggested significant hyperthyroidism.  Remains on IV Lasix.  Cardiology following.  On home Toprol-XL for A-fib.   Thyroid panel with undetectable TSH and elevated free T4 to 3.28.  Total T3 normal.  Thyroid US with 2.4 cm ill-defined solid nodule in the inferior left thyroid lobe and another smaller nodule.  Started on methimazole.   03/20 volume status is improving, she is having intermittent hypotension, plan for TEE cardioversion tomorrow.  03/21 direct current cardioversion with conversion to sinus rhythm.   Assessment and Plan: * Acute on chronic diastolic CHF (congestive heart failure) (HCC) Echocardiogram with preserved LV systolic function with EF 65 to 70%, RV systolic function preserved, RV with severe enlargement, RVSP 46,1 mmHg, LA with moderate dilatation, RA with severe dilatation, moderate MR, severe TR.  Urine output is 2,200  ml Systolic blood pressure 90 mmHg range.   Decrease dose of  metoprolol succinate to 25 mg at nigh and change IV furosemide to po torsemide, to avoid further hypotension.  Plan to continue SGLT 2 inh.   Persistent atrial fibrillation with RVR (HCC) Patient now on sinus rhythm sp direct current cardioversion.  Patient on dabigatran for anticoagulation.   Primary hyperthyroidism Continue methimazole.   AKI (acute kidney injury) (HCC) Hypokalemia.   Renal function today with serum cr at 1,25 with K at 3,4 and serum bicarbonate at 27  Na 141 Mg 2.3   Add 40 meq Kcl x 2 today  Follow up renal function and electrolytes in am.  Continue diuresis with torsemide and SGLT 2 inh.   Obesity, class 3 Calculated BMI is 43.2         Subjective: Patient is feeling better, dyspnea has improved and lower extremity is very close to baseline.  Physical Exam: Vitals:   09/22/23 1016 09/22/23 1024 09/22/23 1140 09/22/23 1144  BP: (!) 86/57 (!) 83/60 (!) 89/66   Pulse:      Resp:      Temp:    97.6 F (36.4 C)  TempSrc:   Oral   SpO2: 99%   99%  Weight:      Height:       Neurology awake and alert ENT with mild pallor Cardiovascular with S1 and S2 present and regular with no gallops or rubs.  No JVD Respiratory with no rales or wheezing, no rhonchi Abdomen with no distention  Positive lower extremity edema, only trace pitting, but she has chronic lymphedema.   Data  Reviewed:    Family Communication: no family at the bedside   Disposition: Status is: Inpatient Remains inpatient appropriate because: IV diuresis   Planned Discharge Destination: Home     Author: Coralie Keens, MD 09/22/2023 1:39 PM  For on call review [email or web address].

## 2023-09-22 NOTE — Progress Notes (Signed)
 Progress Note  Patient Name: Julia Mcfarland Date of Encounter: 09/22/2023 Primary Cardiologist: Thurmon Fair, MD   Subjective   She had a successful cardioversion after one attempt. TEE was without incident, save for post procedural bradycardia (expected) and hypotension (related to high dose BB she had been needing)  Vital Signs    Vitals:   09/22/23 0930 09/22/23 0935 09/22/23 1016 09/22/23 1024  BP: 96/63 (!) 98/53 (!) 86/57 (!) 83/60  Pulse: (!) 53 (!) 53    Resp: 16 (!) 21    Temp:      TempSrc:      SpO2: 98% 95% 99%   Weight:      Height:        Intake/Output Summary (Last 24 hours) at 09/22/2023 1041 Last data filed at 09/22/2023 1000 Gross per 24 hour  Intake 1147 ml  Output 2200 ml  Net -1053 ml   Filed Weights   09/20/23 0549 09/21/23 0412 09/22/23 0500  Weight: 107.2 kg 106.9 kg 105.5 kg    Physical Exam   GEN: No acute distress.   Neck: + JVD Cardiac: Regular bradycardia, no rubs, or gallops. Holsystolic murmur noted Respiratory: Clear to auscultation bilaterally. GI: Soft, nontender, non-distended  MS: +1 edema legs well wrapped  Labs   Telemetry: AF RVR-> Sinus bradycardia   Chemistry Recent Labs  Lab 09/20/23 0218 09/21/23 0220 09/22/23 0245  NA 139 140 141  K 3.4* 3.7 3.4*  CL 96* 100 100  CO2 30 30 27   GLUCOSE 118* 119* 132*  BUN 30* 29* 29*  CREATININE 1.29* 1.13* 1.25*  CALCIUM 8.8* 9.0 9.1  GFRNONAA 43* 50* 45*  ANIONGAP 13 10 14      Hematology Recent Labs  Lab 09/18/23 0331  WBC 4.4  RBC 4.11  HGB 12.5  HCT 38.2  MCV 92.9  MCH 30.4  MCHC 32.7  RDW 14.2  PLT 176    Cardiac EnzymesNo results for input(s): "TROPONINI" in the last 168 hours. No results for input(s): "TROPIPOC" in the last 168 hours.   BNPNo results for input(s): "BNP", "PROBNP" in the last 168 hours.   DDimer No results for input(s): "DDIMER" in the last 168 hours.   Cardiac Studies   Cardiac Studies & Procedures    ______________________________________________________________________________________________     ECHOCARDIOGRAM  ECHOCARDIOGRAM COMPLETE 09/14/2023  Narrative ECHOCARDIOGRAM REPORT    Patient Name:   Julia Mcfarland Date of Exam: 09/14/2023 Medical Rec #:  [id number]      Height:       62.0 in Accession #:    [id number]     Weight:       238.1 lb Date of Birth:  September 30, 1945      BSA:          2.059 m Patient Age:    78 years       BP:           99/67 mmHg Patient Gender: F              HR:           123 bpm. Exam Location:  Inpatient  Procedure: 2D Echo, Color Doppler and Cardiac Doppler (Both Spectral and Color Flow Doppler were utilized during procedure).  Indications:     CHF  History:         Patient has prior history of Echocardiogram examinations. CHF; Arrythmias:Atrial Fibrillation.  Sonographer:     Lamont Snowball Referring Phys:  [phone] Meryle Ready [facility] Diagnosing  Phys: Armanda Magic MD  IMPRESSIONS   1. Left ventricular ejection fraction, by estimation, is 65 to 70%. The left ventricle has normal function. The left ventricle has no regional wall motion abnormalities. Left ventricular diastolic function could not be evaluated. 2. Right ventricular systolic function is normal. The right ventricular size is severely enlarged. There is moderately elevated pulmonary artery systolic pressure. The estimated right ventricular systolic pressure is 46.1 mmHg. 3. Left atrial size was moderately dilated. 4. Right atrial size was severely dilated. 5. The mitral valve is normal in structure. Moderate mitral valve regurgitation. No evidence of mitral stenosis. 6. 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 7. The aortic valve is normal in structure. Aortic valve regurgitation is not visualized. No aortic stenosis is present. 8. The inferior vena cava is dilated in size with <50% respiratory variability, suggesting right atrial pressure of 15 mmHg.  FINDINGS Left Ventricle: Left  ventricular ejection fraction, by estimation, is 65 to 70%. The left ventricle has normal function. The left ventricle has no regional wall motion abnormalities. The left ventricular internal cavity size was normal in size. There is no left ventricular hypertrophy. Left ventricular diastolic function could not be evaluated due to atrial fibrillation. Left ventricular diastolic function could not be evaluated.  Right Ventricle: The right ventricular size is severely enlarged. No increase in right ventricular wall thickness. Right ventricular systolic function is normal. There is moderately elevated pulmonary artery systolic pressure. The tricuspid regurgitant velocity is 2.79 m/s, and with an assumed right atrial pressure of 15 mmHg, the estimated right ventricular systolic pressure is 46.1 mmHg.  Left Atrium: Left atrial size was moderately dilated.  Right Atrium: Right atrial size was severely dilated.  Pericardium: There is no evidence of pericardial effusion.  Mitral Valve: The mitral valve is normal in structure. Mild mitral annular calcification. Moderate mitral valve regurgitation, with eccentric posteriorly directed jet. No evidence of mitral valve stenosis. MV peak gradient, 6.2 mmHg. The mean mitral valve gradient is 3.3 mmHg.  Tricuspid Valve: The tricuspid valve is normal in structure. 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 No evidence of tricuspid stenosis.  Aortic Valve: The aortic valve is normal in structure. Aortic valve regurgitation is not visualized. No aortic stenosis is present. Aortic valve peak gradient measures 13.7 mmHg.  Pulmonic Valve: The pulmonic valve was normal in structure. Pulmonic valve regurgitation is not visualized. No evidence of pulmonic stenosis.  Aorta: The aortic root is normal in size and structure.  Venous: The inferior vena cava is dilated in size with less than 50% respiratory variability, suggesting right atrial pressure of 15  mmHg.  IAS/Shunts: No atrial level shunt detected by color flow Doppler.   LEFT VENTRICLE PLAX 2D LVIDd:         4.00 cm LVIDs:         3.10 cm LV PW:         0.90 cm LV IVS:        0.70 cm LVOT diam:     2.10 cm LV SV:         67 LV SV Index:   33 LVOT Area:     3.46 cm   RIGHT VENTRICLE          IVC RV Basal diam:  5.60 cm  IVC diam: 2.90 cm  LEFT ATRIUM             Index        RIGHT ATRIUM  Index LA Vol (A2C):   94.5 ml 45.90 ml/m  RA Area:     26.50 cm LA Vol (A4C):   76.5 ml 37.16 ml/m  RA Volume:   88.90 ml  43.18 ml/m LA Biplane Vol: 91.8 ml 44.59 ml/m AORTIC VALVE AV Area (Vmax): 2.92 cm AV Vmax:        185.33 cm/s AV Peak Grad:   13.7 mmHg LVOT Vmax:      156.25 cm/s LVOT Vmean:     104.850 cm/s LVOT VTI:       0.194 m  AORTA Ao Root diam: 2.90 cm Ao Asc diam:  3.20 cm  MITRAL VALVE                TRICUSPID VALVE MV Area (PHT): 5.51 cm     TR Peak grad:   31.1 mmHg MV Area VTI:   3.84 cm     TR Vmax:        279.00 cm/s MV Peak grad:  6.2 mmHg MV Mean grad:  3.3 mmHg     SHUNTS MV Vmax:       1.25 m/s     Systemic VTI:  0.19 m MV Vmean:      84.1 cm/s    Systemic Diam: 2.10 cm MV Decel Time: 138 msec MR Peak grad: 97.6 mmHg MR Vmax:      494.00 cm/s MV E velocity: 105.67 cm/s  Armanda Magic MD Electronically signed by Armanda Magic MD Signature Date/Time: 09/14/2023/10:48:47 AM    Final (Updated)    MONITORS  LONG TERM MONITOR (3-14 DAYS) 03/11/2020  Narrative  The dominant rhythm is normal sinus rhythm with normal circadian variation.  There are no episodes of significant bradycardia.  Episode of paroxysmal atrial fibrillation lasting more than 2 days was recorded, with poor ventricular rate control (average ventricular rate of 136 bpm)  Also seen are very brief episodes of nonsustained atrial tachycardia and a single 6 beat episode of nonsustained ventricular tachycardia.  Abnormal event monitor due to the presence of  paroxysmal atrial fibrillation with rapid ventricular response.     CARDIAC MRI  MR CARDIAC MORPHOLOGY W WO CONTRAST 11/16/2022  Narrative CLINICAL DATA:  R/O Amyloid  EXAM: CARDIAC MRI  TECHNIQUE: The patient was scanned on a 1.5 Tesla Siemens magnet. A dedicated cardiac coil was used. Functional imaging was done using Fiesta sequences. 2,3, and 4 chamber views were done to assess for RWMA's. Modified Simpson's rule using a short axis stack was used to calculate an ejection fraction on a dedicated work [employer]. The patient received 10 cc of Gadavist. After 10 minutes inversion recovery sequences were used to assess for infiltration and scar tissue.  CONTRAST:  [id number]  FINDINGS: Severe bi atrial enlargement No LAA thrombus. No ASD/PFO No pericardial effusion Normal IVC diameter 1.6 cm Normal ascending thoracic aorta 3.4 cm. Tri leaflet AV Atrial functional MR appears at least moderate on true FISP images Normal TV/PV.  Normal LV size and function Quantitative EF 52% (EDV 155 ESV 74 SV 81 cc )  Mildly dilated RV with low normal function RVEF 48% (EDV 210 cc ESV 109 cc SV 101 cc )  Estimated cardiac output based on flow velocity through the aortic valve annulus 6.3 L/min  Delayed enhancement images with gadolinium showed normal nulling and no uptake No evidence of amyloid  Parametric measures  T1 1007 msec normal  ECV 34% normal  T2 44 msec normal  IMPRESSION: 1.  Severe bi  atrial enlargement no LAA thrombus  2.  Normal LV size and function LVEF 52%  3.  Mild RVE with low normal function RVEF 48%  4. Delayed enhancement images with normal nulling and no gadolinium uptake No evidence of amyloid  5.  Moderate appearing atrial functional MR  6.  Normal parametric measures  7.  Normal cardiac output 6.3 L/min  Charlton Haws   Electronically Signed By: Charlton Haws M.D. On: 11/16/2022 15:42  PYP SCAN  MYOCARDIAL AMYLOID  PLANAR AND SPECT 08/24/2022  Narrative   By semi-quantitative assessment scan is consistent with increased heart uptake but less than rib uptake-Grade 1. Heart to contralateral lung ratio is between 1-1.5, indeterminate for amyloid.   Study is equivocal for TTR amyloidosis (visual score of 1/ratio between 1-1.5).   Prior study not available for comparison.  Equivocal study for TTR amyloid.  Cardiac MRI recommended  ______________________________________________________________________________________________           Assessment & Plan   Severe Tricuspid regurgitation She has severe tricuspid regurgitation, which is functional in nature due to severe atrial functional regurgitation.  - There is a septal anterior flail gap of 1.2 cm on zero view. The regurgitation is central with a significant flail gap of the anterior posterior leaflet as well.  - Imaging shows a trileaflet tricuspid valve, though deep gastric imaging is challenging. On 3D evaluation, she appears to have a trileaflet tricuspid valve, with the jet predominantly anterior septal but generally very central in nature. Suitable grasping views were obtained on 3D multiplanar reconstruction, but she has unfavorable anatomy with a coaptation gap greater than 6.5 mm.  - Tricuspid TEER score is 1 2 due to septolateral gap and image quality suggestion of moderate chance of procedural success - She has severe right ventricular dilation. The interventricular septum bows to the left, suggesting increased right atrial pressure. 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 II, Stage C, hypervolemic - continue SGLT2i - continue UNNA  boot - no MRA (BP) - switched to torsemide 40 mg PO BID today will need K at DC   PAF - continue home pradaxa - continue low dose BB as above   AKI Hypokalemia - Stable  with decongestion and repletion   Morbid Obesity - weight control will improve comorbidity control - - 30 lbs  If hypotension resolves she may be able to leave as early as today; given hypotension may be reasonable to plan for safe DC tomorrow; at 10/11/23 f/u low threshold for RHC to see if [facility] physiology is stable to improving  For questions or updates, please contact [facility] Please consult [email or web address] for contact info under Cardiology/STEMI.      Riley Lam, MD FASE [facility] Cardiologist [facility]  [address] [place], [address] [place], [place] [address] [phone]  10:41 AM

## 2023-09-22 NOTE — Progress Notes (Signed)
 Mobility Specialist Progress Note:   09/22/23 1400  Mobility  Activity Ambulated with assistance in room  Level of Assistance Standby assist, set-up cues, supervision of patient - no hands on  Assistive Device None  Distance Ambulated (ft) 30 ft  Activity Response Tolerated well  Mobility Referral Yes  Mobility visit 1 Mobility  Mobility Specialist Start Time (ACUTE ONLY) 1400  Mobility Specialist Stop Time (ACUTE ONLY) 1411  Mobility Specialist Time Calculation (min) (ACUTE ONLY) 11 min   Pre Mobility: 101/71 (80) During Mobility:HR 60 Post Mobility:HR 65  Pt agreeable to mobility session. Required only supervision for safety. Pt c/o generalized fatigue, denies dizziness/SOB. Pt back in bed with all needs met.   Addison Lank Mobility Specialist Please contact via SecureChat or  Rehab office at [phone]

## 2023-09-22 NOTE — Progress Notes (Signed)
 Orthopedic Tech Progress Note Patient Details:  Julia Mcfarland 1945/09/14 [id number]  Patient ID: [name], female   DOB: 09-14-1945, 78 y.o.   MRN: [id number] Re-wrap coban on unna boots. Tonye Pearson 09/22/2023, 1:20 PM

## 2023-09-22 NOTE — Transfer of Care (Signed)
 Immediate Anesthesia Transfer of Care Note  [name]  Procedure(s) Performed: TRANSESOPHAGEAL ECHOCARDIOGRAM CARDIOVERSION  Patient Location: PACU and Cath Lab  Anesthesia Type:General  Level of Consciousness: drowsy and patient cooperative  Airway & Oxygen Therapy: Patient Spontanous Breathing and Patient connected to nasal cannula oxygen  Post-op Assessment: Report given to RN and Post -op Vital signs reviewed and stable  Post vital signs: Reviewed and stable  Last Vitals:  Vitals Value Taken Time  BP    Temp    Pulse    Resp 17 09/22/23 0926  SpO2    Vitals shown include unfiled device data.  Last Pain:  Vitals:   09/22/23 0745  TempSrc:   PainSc: 0-No pain         Complications: No notable events documented.

## 2023-09-22 NOTE — Interval H&P Note (Signed)
 History and Physical Interval Note:  09/22/2023 8:19 AM  Julia Mcfarland  has presented today for surgery, with the diagnosis of afib.  The various methods of treatment have been discussed with the patient and family. After consideration of risks, benefits and other options for treatment, the patient has consented to  Procedure(s): TRANSESOPHAGEAL ECHOCARDIOGRAM (N/A) CARDIOVERSION (N/A) as a surgical intervention.  The patient's history has been reviewed, patient examined, no change in status, stable for surgery.  I have reviewed the patient's chart and labs.  Questions were answered to the patient's satisfaction.     Parke Poisson

## 2023-09-22 NOTE — Anesthesia Postprocedure Evaluation (Signed)
 Anesthesia Post Note  Patient: JAMIEE MILHOLLAND  Procedure(s) Performed: TRANSESOPHAGEAL ECHOCARDIOGRAM CARDIOVERSION     Patient location during evaluation: PACU Anesthesia Type: MAC Level of consciousness: awake and alert Pain management: pain level controlled Vital Signs Assessment: post-procedure vital signs reviewed and stable Respiratory status: spontaneous breathing, nonlabored ventilation, respiratory function stable and patient connected to nasal cannula oxygen Cardiovascular status: blood pressure returned to baseline and stable Postop Assessment: no apparent nausea or vomiting Anesthetic complications: no   No notable events documented.  Last Vitals:  Vitals:   09/22/23 1016 09/22/23 1024  BP: (!) 86/57 (!) 83/60  Pulse:    Resp:    Temp:    SpO2: 99%     Last Pain:  Vitals:   09/22/23 0929  TempSrc: Temporal  PainSc: 0-No pain                 Mariann Barter

## 2023-09-22 NOTE — Anesthesia Preprocedure Evaluation (Addendum)
 Anesthesia Evaluation  Patient identified by MRN, date of birth, ID band Patient awake    Reviewed: Allergy & Precautions, NPO status , Patient's Chart, lab work & pertinent test results, reviewed documented beta blocker date and time   History of Anesthesia Complications (+) PONV and history of anesthetic complications  Airway Mallampati: III  TM Distance: >3 FB Neck ROM: Full    Dental no notable dental hx.    Pulmonary shortness of breath, neg COPD, neg PE   breath sounds clear to auscultation       Cardiovascular (-) hypertension+ CAD and +CHF  + dysrhythmias Atrial Fibrillation  Rhythm:Irregular Rate:Normal + Systolic murmurs Normal BiV function, severe TR, mod MR   Neuro/Psych neg Seizures    GI/Hepatic ,neg GERD  ,,(+) neg Cirrhosis        Endo/Other   Hyperthyroidism   Renal/GU CRFRenal disease     Musculoskeletal  (+) Arthritis ,    Abdominal   Peds  Hematology   Anesthesia Other Findings   Reproductive/Obstetrics                              Anesthesia Physical Anesthesia Plan  ASA: 3  Anesthesia Plan: General   Post-op Pain Management:    Induction: Intravenous  PONV Risk Score and Plan: 2 and Ondansetron and Propofol infusion  Airway Management Planned: Nasal Cannula and Natural Airway  Additional Equipment:   Intra-op Plan:   Post-operative Plan:   Informed Consent: I have reviewed the patients History and Physical, chart, labs and discussed the procedure including the risks, benefits and alternatives for the proposed anesthesia with the patient or authorized representative who has indicated his/her understanding and acceptance.     Dental advisory given  Plan Discussed with: CRNA  Anesthesia Plan Comments:          Anesthesia Quick Evaluation

## 2023-09-23 ENCOUNTER — Other Ambulatory Visit (HOSPITAL_COMMUNITY): Payer: Self-pay

## 2023-09-23 DIAGNOSIS — N179 Acute kidney failure, unspecified: Secondary | ICD-10-CM | POA: Diagnosis not present

## 2023-09-23 DIAGNOSIS — E0590 Thyrotoxicosis, unspecified without thyrotoxic crisis or storm: Secondary | ICD-10-CM | POA: Diagnosis not present

## 2023-09-23 DIAGNOSIS — I5033 Acute on chronic diastolic (congestive) heart failure: Secondary | ICD-10-CM | POA: Diagnosis not present

## 2023-09-23 DIAGNOSIS — I4819 Other persistent atrial fibrillation: Secondary | ICD-10-CM | POA: Diagnosis not present

## 2023-09-23 LAB — BASIC METABOLIC PANEL
Anion gap: 10 (ref 5–15)
BUN: 32 mg/dL — ABNORMAL HIGH (ref 8–23)
CO2: 29 mmol/L (ref 22–32)
Calcium: 8.8 mg/dL — ABNORMAL LOW (ref 8.9–10.3)
Chloride: 101 mmol/L (ref 98–111)
Creatinine, Ser: 1.21 mg/dL — ABNORMAL HIGH (ref 0.44–1.00)
GFR, Estimated: 46 mL/min — ABNORMAL LOW (ref >60)
Glucose, Bld: 129 mg/dL — ABNORMAL HIGH (ref 70–99)
Potassium: 4 mmol/L (ref 3.5–5.1)
Sodium: 140 mmol/L (ref 135–145)

## 2023-09-23 LAB — ECHO TEE

## 2023-09-23 LAB — MAGNESIUM: Magnesium: 2.3 mg/dL (ref 1.7–2.4)

## 2023-09-23 MED ORDER — TORSEMIDE 20 MG PO TABS
40.0000 mg | ORAL_TABLET | Freq: Two times a day (BID) | ORAL | 0 refills | Status: AC
  Filled 2023-09-23: qty 120, 30d supply, fill #0

## 2023-09-23 MED ORDER — TORSEMIDE 40 MG PO TABS: 40.0000 mg | ORAL_TABLET | Freq: Two times a day (BID) | ORAL | Status: DC

## 2023-09-23 MED ORDER — TORSEMIDE 40 MG PO TABS
20.0000 mg | ORAL_TABLET | Freq: Two times a day (BID) | ORAL | 0 refills | Status: DC
  Filled 2023-09-23: qty 120, 30d supply, fill #0

## 2023-09-23 MED ORDER — METHIMAZOLE 10 MG PO TABS
10.0000 mg | ORAL_TABLET | Freq: Two times a day (BID) | ORAL | 0 refills | Status: AC
  Filled 2023-09-23: qty 60, 30d supply, fill #0

## 2023-09-23 MED ORDER — METOPROLOL SUCCINATE ER 25 MG PO TB24
25.0000 mg | ORAL_TABLET | Freq: Every day | ORAL | 0 refills | Status: DC
  Filled 2023-09-23: qty 30, 30d supply, fill #0

## 2023-09-23 MED ORDER — EMPAGLIFLOZIN 10 MG PO TABS
10.0000 mg | ORAL_TABLET | Freq: Every day | ORAL | 0 refills | Status: DC
  Filled 2023-09-23: qty 30, 30d supply, fill #0

## 2023-09-23 MED ORDER — DABIGATRAN ETEXILATE MESYLATE 150 MG PO CAPS: 150.0000 mg | ORAL_CAPSULE | Freq: Two times a day (BID) | ORAL | 0 refills | Status: AC

## 2023-09-23 MED ORDER — DABIGATRAN ETEXILATE MESYLATE 150 MG PO CAPS
150.0000 mg | ORAL_CAPSULE | Freq: Two times a day (BID) | ORAL | 0 refills | Status: DC
  Filled 2023-09-23: qty 60, 30d supply, fill #0

## 2023-09-23 NOTE — Progress Notes (Signed)
 Mobility Specialist Progress Note:   09/23/23 1100  Mobility  Activity Ambulated with assistance in hallway  Level of Assistance Standby assist, set-up cues, supervision of patient - no hands on  Assistive Device Cane  Distance Ambulated (ft) 400 ft  Activity Response Tolerated well  Mobility Referral Yes  Mobility visit 1 Mobility  Mobility Specialist Start Time (ACUTE ONLY) 1100  Mobility Specialist Stop Time (ACUTE ONLY) 1115  Mobility Specialist Time Calculation (min) (ACUTE ONLY) 15 min   Pt agreeable to mobility session. Required no physical assistance throughout. HR 70s-80s throughout session. Pt back in chair with all needs met.   Addison Lank Mobility Specialist Please contact via SecureChat or  Rehab office at [phone]

## 2023-09-23 NOTE — Progress Notes (Addendum)
 Patient Name: Julia Mcfarland Date of Encounter: 09/23/2023 [facility] Cardiologist: Thurmon Fair, MD   Interval Summary  .    Successful cardioversion.  Hypotension and bradycardia post.Feels well now. Eager to go home.   Vital Signs .    Vitals:   09/22/23 2000 09/22/23 2350 09/23/23 0413 09/23/23 0837  BP: 100/88 106/63 101/68 93/72  Pulse: 66 63 67 71  Resp: 20 17 18 18   Temp: 98.2 F (36.8 C) 97.6 F (36.4 C) 98.2 F (36.8 C) 98.5 F (36.9 C)  TempSrc: Oral Oral Oral Oral  SpO2: 100% 100% 100% 100%  Weight:   104.6 kg   Height:        Intake/Output Summary (Last 24 hours) at 09/23/2023 0855 Last data filed at 09/23/2023 0300 Gross per 24 hour  Intake 2038 ml  Output 600 ml  Net 1438 ml      09/23/2023    4:13 AM 09/22/2023    5:00 AM 09/21/2023    4:12 AM  Last 3 Weights  Weight (lbs) 230 lb 11.2 oz 232 lb 9.6 oz 235 lb 11.2 oz  Weight (kg) 104.645 kg 105.507 kg 106.913 kg      Telemetry/ECG    No adverse rhythm - Personally Reviewed  Echo-EF 70% pulmonary pressure 47 mmHg right atrial severely dilated, severe tricuspid regurgitation  Physical Exam .   GEN: No acute distress.   Neck: No JVD Cardiac: RRR, 2/6 holosystolic murmur, rubs, or gallops.  Respiratory: Clear to auscultation bilaterally. GI: Soft, nontender, non-distended  MS: Positive lower extremity edema, LE ACE wraps, large LE  Assessment & Plan .     Severe tricuspid regurgitation - She has severe tricuspid regurgitation which is functional in nature due to severe atrial functional regurgitation.  There is a septal anterior flail gap of 1.2 cm on 0 view.  The regurgitation is central with a significant flail gap of the anterior posterior leaflet as well.  Imaging shows a trileaflet tricuspid valve though deep gastric imaging is challenging.  On 3D evaluation appears to have trileaflet tricuspid valve.  The jet is predominantly anterior septal but is generally very central.   Suitable grasping views were obtained on 3D multiplanar reconstruction but she has unfavorable anatomy with a coaptation gap greater than 6.5 mm. -Tricuspid TER score 8 is 12 due to septal lateral gap and image quality suggestion of moderate chance of procedural success. -She has severe right ventricular dilatation and the interventricular septum bows to the left suggesting of increased right atrial pressures.  The inferior vena cava is dilated and not collapsible with hepatic vein systolic flow reversal.  This is despite 30+ pounds of diuresis.  Postprocedural hypertension - Beta-blocker dose greatly decreased to 25 mg of succinate at night  Acute on chronic preserved EF heart failure - NYHA class II stage C hypervolemic - On SGLT2 inhibitor, Unna boots, no spironolactone secondary to blood pressure - Switch to torsemide 40 mg p.o. twice daily and will require potassium supplementation  Paroxysmal atrial fibrillation - Continue with home Pradaxa and low-dose beta-blocker - Acute kidney injury - Hypokalemia stable with decongestion and repletion  Morbid obesity - Continue encourage weight loss to help improve overall comorbidities  Hyperthyroidism - On methimazole 10 mg twice daily  Discussed with primary team, Dr. Ella Jubilee.  Complex medical decision making.   She has follow-up appointment on/9/25.  Blood pressure today low normal.  OK for DC  For questions or updates, please contact [facility]  Please consult [email or web address] for contact info under        Signed, Donato Schultz, MD

## 2023-09-23 NOTE — Progress Notes (Signed)
 Pt has orders to be discharged. Discharge instructions given and pt has no additional questions at this time. Medication regimen reviewed and pt educated. Pt verbalized understanding and has no additional questions. Telemetry box removed. IV removed and site in good condition.

## 2023-09-23 NOTE — Discharge Summary (Addendum)
 Physician Discharge Summary   [name] MRN: [id number] DOB: Mar 31, 1946  Admit date:     09/13/2023  Discharge date: 09/23/23  Discharge Physician: York Ram Taela Charbonneau   PCP: Hillery Aldo, NP   Recommendations at discharge:    Patient has been placed on methimazole for hyperthyroid, will need follow up thyroid function testing as outpatient in 2 to 3 weeks.  Furosemide has been changed to torsemide for better diuresis.  Metoprolol has been reduced to 25 mg to avoid bradycardia.  Holding losartan until renal function more stable.  Follow up renal function and electrolytes as outpatient in 7 days Follow up with Dr Jacinto Reap in 7 to 10 days Follow up with Cardiology as scheduled.   I spoke with patient's daughter at the bedside, we talked in detail about patient's condition, plan of care and prognosis and all questions were addressed.  Discharge Diagnoses: Principal Problem:   Acute on chronic diastolic CHF (congestive heart failure) (HCC) Active Problems:   Persistent atrial fibrillation with RVR (HCC)   Primary hyperthyroidism   AKI (acute kidney injury) (HCC)   Obesity, class 3  Resolved Problems:   Stage 3a chronic kidney disease (CKD) (HCC)   Acute CHF (congestive heart failure) [facility])  Hospital Course: Julia Mcfarland was admitted to the hospital with the working diagnosis of heart failure exacerbation in the setting of atrial fibrillation with RVR.   78 year old F with PMH of diastolic CHF, PAH, sev TR, mod-sev MR, PAF, obesity class 3, and chronic lower extremity edema presented to ED with worsening dyspnea on exertion and lower extremity edema despite increased dose of Lasix outpatient.  In ED, in A-fib with RVR with HR to 120s,her blood pressure was 106/74, RR 21 and 02 saturation 98%. Lungs with no wheezing or rhonchi, heart with S1 and S2 present, irregularly irregular with no gallops, or rubs, positive systolic murmur at the right lower sternal border, abdomen  with no distention and positive lower extremity edema.      Na 142, K 4.0 Cl 105 bicarbonate 26, glucose 111, bun 15 cr 0,84  Mg 2.2  BNP 359  High sensitive troponin 5  Wbc 5.5 hgb 13.3 plt 194   Echocardiogram with preserved LV systolic function, positive signs of pulmonary hypertension and RV failure.   Thyroid panel suggested significant hyperthyroidism.  .     Chest radiograph with right rotation, cardiomegaly with mild bilateral hilar vascular congestion.   EKG 115 bpm, normal axis, normal intervals, qtc 434, atrial fibrillation rhythm with no significant ST segment or T wave changes.    03/20 volume status is improving, she is having intermittent hypotension, plan for TEE cardioversion tomorrow.  03/21 direct current cardioversion with conversion to sinus rhythm.  03/22 improved volume status, blood pressure more stable, she continue sinus rhythm.  Plan to follow up as outpatient.   Assessment and Plan: * Acute on chronic diastolic CHF (congestive heart failure) (HCC) Echocardiogram with preserved LV systolic function with EF 65 to 70%, RV systolic function preserved, RV with severe enlargement, RVSP 46,1 mmHg, LA with moderate dilatation, RA with severe dilatation, moderate MR, severe TR.  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   Patient was placed on IV furosemide for diuresis, negative fluid balance was achieved, - 12,045 ml, with significant improvement in her symptoms.   Patient will continue heart failure management with metoprolol succinate, and SGLT 2 inh.  Holding on RAAS inhibition until renal function more stable and to avoid hypotension.  Diuresis  with torsemide Follow up with Cardiology as outpatient, may need right heart catheterization.   Atrial fibrillation, chronic (HCC) Patient required direct current cardioversion, converting to sinus rhythm.  Continue rate control with metoprolol 25 mg succinate.  Patient on dabigatran for  anticoagulation.   Primary hyperthyroidism TSH less than 0,010 With free T4 3,26 Total T3 152  Thyroglobulin antibody <0.10 Thyroid stimulating stimulating antibody 1.60 (positive).  Thyroid US with 2,4 cm ill defined solid nodule in the inferior left thyroid lobe meets criteria for 1 year follow up ultrasound.  This may be a pseudo nodule based on appearance.   Continue methimazole.  Follow up thyroid function testing in 2 to 3 weeks.   AKI (acute kidney injury) (HCC) Hypokalemia.   At the time of discharge her renal function is more stable with serum cr at 1,2 with K at 4.0 and serum bicarbonate 29  Na 140 Mg 2.3   Continue torsemide 40 mg bid, and SGLT 2 inh.  Low dose K supplementation and follow up renal function and electrolytes as outpatient.   Obesity, class 3 Calculated BMI is 43.2         Consultants: cardiology  Procedures performed: direct current cardioversion / TEE   Disposition: Home Diet recommendation:  Cardiac diet DISCHARGE MEDICATION: Allergies as of 09/23/2023   No Known Allergies      Medication List     STOP taking these medications    furosemide 40 MG tablet Commonly known as: LASIX   losartan 25 MG tablet Commonly known as: COZAAR       TAKE these medications    albuterol 108 (90 Base) MCG/ACT inhaler Commonly known as: VENTOLIN HFA INHALE 2 PUFFS INTO LUNGS EVERY 6 HOURS AS NEEDED FOR WHEEZING OR SHORTNESS OF BREATH What changed: See the new instructions.   cetirizine 10 MG tablet Commonly known as: ZYRTEC Take 10 mg by mouth daily.   dabigatran 150 MG Caps capsule Commonly known as: PRADAXA Take 1 capsule (150 mg total) by mouth 2 (two) times daily.   empagliflozin 10 MG Tabs tablet Commonly known as: Jardiance Take 1 tablet (10 mg total) by mouth daily before breakfast.   methimazole 10 MG tablet Commonly known as: TAPAZOLE Take 1 tablet (10 mg total) by mouth 2 (two) times daily.   metoprolol succinate 25 MG  24 hr tablet Commonly known as: TOPROL-XL Take 1 tablet (25 mg total) by mouth at bedtime. What changed:  medication strength See the new instructions.   polyethylene glycol 17 g packet Commonly known as: MIRALAX / GLYCOLAX Take 17 g by mouth daily as needed for mild constipation.   potassium chloride SA 20 MEQ tablet Commonly known as: KLOR-CON M Take 1 tablet (20 mEq total) by mouth daily.   Torsemide 40 MG Tabs Take 40 mg by mouth 2 (two) times daily.   vitamin B-12 500 MCG tablet Commonly known as: CYANOCOBALAMIN Take 500 mcg by mouth daily.   vitamin C 1000 MG tablet Take 1,000 mg by mouth daily.   Vitamin D3 125 MCG (5000 UT) Caps Take 5,000 Units by mouth daily.        Discharge Exam: Filed Weights   09/21/23 0412 09/22/23 0500 09/23/23 0413  Weight: 106.9 kg 105.5 kg 104.6 kg   BP 93/72 (BP Location: Left Arm)   Pulse 71   Temp 98.5 F (36.9 C) (Oral)   Resp 18   Ht 5\' 2"  (1.575 m)   Wt 104.6 kg   SpO2 100%  BMI 42.20 kg/m   Patient is feeling better, no chest pain, no dyspnea or palpitations, her edema has improved, now back to baseline  Neurology awake and alert ENT with mild pallor Cardiovascular with S1 and S2 present and regular with no gallops or rubs. Positive systolic murmur at the right lower sternal border.  No JVD Respiratory with no rales or wheezing, no rhonchi Abdomen with no distention  Positive non pitting lower extremity edema, signs of lymphedema.   Condition at discharge: stable  The results of significant diagnostics from this hospitalization (including imaging, microbiology, ancillary and laboratory) are listed below for reference.   Imaging Studies: EP STUDY Result Date: 09/22/2023 See surgical note for result.  US THYROID Result Date: 09/16/2023 CLINICAL DATA:  Hyperthyroid. EXAM: THYROID ULTRASOUND TECHNIQUE: Ultrasound examination of the thyroid gland and adjacent soft tissues was performed. COMPARISON:  None  Available. FINDINGS: Parenchymal Echotexture: Moderately heterogenous Isthmus: 0.2 Right lobe: 4.9 x 2.2 x 3.1 cm Left lobe: 5.4 x 2.3 x 2.8 cm _________________________________________________________ Estimated total number of nodules >/= 1 cm: 1 Number of spongiform nodules >/=  2 cm not described below (TR1): 0 Number of mixed cystic and solid nodules >/= 1.5 cm not described below (TR2): 0 _________________________________________________________ Nodule # 1: Location: Left; Inferior Maximum size: 2.4 cm; Other 2 dimensions: 2.2 x 2.3 cm Composition: solid/almost completely solid (2) Echogenicity: isoechoic (1) Shape: not taller-than-wide (0) Margins: ill-defined (0) Echogenic foci: none (0) ACR TI-RADS total points: 3. ACR TI-RADS risk category: TR3 (3 points). ACR TI-RADS recommendations: *Given size (>/= 1.5 - 2.4 cm) and appearance, a follow-up ultrasound in 1 year should be considered based on TI-RADS criteria. This may be a pseudo nodule based on appearance. _________________________________________________________ Nodule # 2: Location: Left; Mid Maximum size: 0.8 cm; Other 2 dimensions: 0.7 x 0.8 cm Composition: spongiform (0) Echogenicity: anechoic (0) Shape: not taller-than-wide (0) Margins: smooth (0) Echogenic foci: none (0) ACR TI-RADS total points: 0. ACR TI-RADS risk category: TR1 (0-1 points). ACR TI-RADS recommendations: This nodule does NOT meet TI-RADS criteria for biopsy or dedicated follow-up. _________________________________________________________ No enlarged or abnormal appearing lymph nodes are identified. IMPRESSION: 2.4 cm ill-defined solid nodule in the inferior left thyroid lobe (TI-RADS category 3) meets criteria for 1 year follow-up ultrasound. This may be a pseudo nodule based on appearance. Electronically Signed   By: Irish Lack M.D.   On: 09/16/2023 10:16   ECHOCARDIOGRAM COMPLETE Result Date: 09/14/2023    ECHOCARDIOGRAM REPORT   Patient Name:   ZYAIRAH WACHA Date of  Exam: 09/14/2023 Medical Rec #:  [id number]      Height:       62.0 in Accession #:    [id number]     Weight:       238.1 lb Date of Birth:  Oct 29, 1945      BSA:          2.059 m Patient Age:    77 years       BP:           99/67 mmHg Patient Gender: F              HR:           123 bpm. Exam Location:  Inpatient Procedure: 2D Echo, Color Doppler and Cardiac Doppler (Both Spectral and Color            Flow Doppler were utilized during procedure). Indications:     CHF  History:  Patient has prior history of Echocardiogram examinations. CHF;                  Arrythmias:Atrial Fibrillation.  Sonographer:     Lamont Snowball Referring Phys:  [id number] Eduard Clos Diagnosing Phys: Armanda Magic MD IMPRESSIONS  1. Left ventricular ejection fraction, by estimation, is 65 to 70%. The left ventricle has normal function. The left ventricle has no regional wall motion abnormalities. Left ventricular diastolic function could not be evaluated.  2. Right ventricular systolic function is normal. The right ventricular size is severely enlarged. There is moderately elevated pulmonary artery systolic pressure. The estimated right ventricular systolic pressure is 46.1 mmHg.  3. Left atrial size was moderately dilated.  4. Right atrial size was severely dilated.  5. The mitral valve is normal in structure. Moderate mitral valve regurgitation. No evidence of mitral stenosis.  6. 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  7. The aortic valve is normal in structure. Aortic valve regurgitation is not visualized. No aortic stenosis is present.  8. The inferior vena cava is dilated in size with <50% respiratory variability, suggesting right atrial pressure of 15 mmHg. FINDINGS  Left Ventricle: Left ventricular ejection fraction, by estimation, is 65 to 70%. The left ventricle has normal function. The left ventricle has no regional wall motion abnormalities. The left ventricular internal cavity size was normal in size. There is  no left  ventricular hypertrophy. Left ventricular diastolic function could not be evaluated due to atrial fibrillation. Left ventricular diastolic function could not be evaluated. Right Ventricle: The right ventricular size is severely enlarged. No increase in right ventricular wall thickness. Right ventricular systolic function is normal. There is moderately elevated pulmonary artery systolic pressure. The tricuspid regurgitant velocity is 2.79 m/s, and with an assumed right atrial pressure of 15 mmHg, the estimated right ventricular systolic pressure is 46.1 mmHg. Left Atrium: Left atrial size was moderately dilated. Right Atrium: Right atrial size was severely dilated. Pericardium: There is no evidence of pericardial effusion. Mitral Valve: The mitral valve is normal in structure. Mild mitral annular calcification. Moderate mitral valve regurgitation, with eccentric posteriorly directed jet. No evidence of mitral valve stenosis. MV peak gradient, 6.2 mmHg. The mean mitral valve gradient is 3.3 mmHg. Tricuspid Valve: The tricuspid valve is normal in structure. 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 No evidence of tricuspid stenosis. Aortic Valve: The aortic valve is normal in structure. Aortic valve regurgitation is not visualized. No aortic stenosis is present. Aortic valve peak gradient measures 13.7 mmHg. Pulmonic Valve: The pulmonic valve was normal in structure. Pulmonic valve regurgitation is not visualized. No evidence of pulmonic stenosis. Aorta: The aortic root is normal in size and structure. Venous: The inferior vena cava is dilated in size with less than 50% respiratory variability, suggesting right atrial pressure of 15 mmHg. IAS/Shunts: No atrial level shunt detected by color flow Doppler.  LEFT VENTRICLE PLAX 2D LVIDd:         4.00 cm LVIDs:         3.10 cm LV PW:         0.90 cm LV IVS:        0.70 cm LVOT diam:     2.10 cm LV SV:         67 LV SV Index:   33 LVOT Area:     3.46 cm  RIGHT VENTRICLE           IVC RV Basal diam:  5.60 cm  IVC diam: 2.90  cm LEFT ATRIUM             Index        RIGHT ATRIUM           Index LA Vol (A2C):   94.5 ml 45.90 ml/m  RA Area:     26.50 cm LA Vol (A4C):   76.5 ml 37.16 ml/m  RA Volume:   88.90 ml  43.18 ml/m LA Biplane Vol: 91.8 ml 44.59 ml/m  AORTIC VALVE AV Area (Vmax): 2.92 cm AV Vmax:        185.33 cm/s AV Peak Grad:   13.7 mmHg LVOT Vmax:      156.25 cm/s LVOT Vmean:     104.850 cm/s LVOT VTI:       0.194 m  AORTA Ao Root diam: 2.90 cm Ao Asc diam:  3.20 cm MITRAL VALVE                TRICUSPID VALVE MV Area (PHT): 5.51 cm     TR Peak grad:   31.1 mmHg MV Area VTI:   3.84 cm     TR Vmax:        279.00 cm/s MV Peak grad:  6.2 mmHg MV Mean grad:  3.3 mmHg     SHUNTS MV Vmax:       1.25 m/s     Systemic VTI:  0.19 m MV Vmean:      84.1 cm/s    Systemic Diam: 2.10 cm MV Decel Time: 138 msec MR Peak grad: 97.6 mmHg MR Vmax:      494.00 cm/s MV E velocity: 105.67 cm/s Armanda Magic MD Electronically signed by Armanda Magic MD Signature Date/Time: 09/14/2023/10:48:47 AM    Final (Updated)    DG Knee Complete 4 Views Right Result Date: 09/13/2023 CLINICAL DATA:  pain EXAM: RIGHT KNEE - COMPLETE 4+ VIEW COMPARISON:  None available FINDINGS: Total right knee arthroplasty. No radiographic surgical hardware complication. No evidence of fracture, dislocation, or joint effusion. No evidence of severe arthropathy or other focal bone abnormality. Diffuse subcutaneus soft tissue edema. IMPRESSION: 1. No acute displaced fracture or dislocation. 2. Total right knee arthroplasty. Electronically Signed   By: Tish Frederickson M.D.   On: 09/13/2023 22:56   DG Knee Complete 4 Views Left Result Date: 09/13/2023 CLINICAL DATA:  pain EXAM: LEFT KNEE - COMPLETE 4+ VIEW COMPARISON:  X-ray left knee dated 07/30/2021 FINDINGS: Total left knee arthroplasty. No radiographic findings to suggest surgical hardware complication. No evidence of fracture, dislocation, or joint effusion. No evidence of  arthropathy or other focal bone abnormality. Diffuse subcutaneus soft tissue edema. IMPRESSION: 1. No acute displaced fracture or dislocation. 2. Total left knee arthroplasty. Electronically Signed   By: Tish Frederickson M.D.   On: 09/13/2023 22:54   DG Chest 2 View Result Date: 09/13/2023 CLINICAL DATA:  sob EXAM: CHEST - 2 VIEW COMPARISON:  Chest x-ray 08/03/2022, CT chest 03/22/2021 FINDINGS: Persistent enlarged cardiac silhouette. The heart and mediastinal contours are unchanged. Atherosclerotic plaque. No focal consolidation. No pulmonary edema. No pleural effusion. No pneumothorax. No acute osseous abnormality. IMPRESSION: 1. No active cardiopulmonary disease. 2.  Aortic Atherosclerosis (ICD10-I70.0). Electronically Signed   By: Tish Frederickson M.D.   On: 09/13/2023 22:52    Microbiology: Results for orders placed or performed during the hospital encounter of 08/28/20  SARS CORONAVIRUS 2 (TAT 6-24 HRS) Nasopharyngeal Nasopharyngeal Swab     Status: None   Collection Time: 08/28/20 10:41 AM   Specimen: Nasopharyngeal Swab  Result Value Ref Range Status   SARS Coronavirus 2 NEGATIVE NEGATIVE Final    Comment: (NOTE) SARS-CoV-2 target nucleic acids are NOT DETECTED.  The SARS-CoV-2 RNA is generally detectable in upper and lower respiratory specimens during the acute phase of infection. Negative results do not preclude SARS-CoV-2 infection, do not rule out co-infections with other pathogens, and should not be used as the sole basis for treatment or other patient management decisions. Negative results must be combined with clinical observations, patient history, and epidemiological information. The expected result is Negative.  Fact Sheet for Patients: HairSlick.no  Fact Sheet for Healthcare Providers: quierodirigir.com  This test is not yet approved or cleared by the [place] FDA and  has been authorized for detection and/or  diagnosis of SARS-CoV-2 by FDA under an Emergency Use Authorization (EUA). This EUA will remain  in effect (meaning this test can be used) for the duration of the COVID-19 declaration under Se ction 564(b)(1) of the Act, 21 U.S.C. section 360bbb-3(b)(1), unless the authorization is terminated or revoked sooner.  Performed at [facility], [address] [place], [place] [address]     Labs: CBC: Recent Labs  Lab 09/18/23 0331  WBC 4.4  HGB 12.5  HCT 38.2  MCV 92.9  PLT 176   Basic Metabolic Panel: Recent Labs  Lab 09/19/23 0251 09/20/23 0218 09/21/23 0220 09/22/23 0245 09/23/23 0240  NA 139 139 140 141 140  K 3.8 3.4* 3.7 3.4* 4.0  CL 98 96* 100 100 101  CO2 29 30 30 27 29   GLUCOSE 110* 118* 119* 132* 129*  BUN 25* 30* 29* 29* 32*  CREATININE 1.11* 1.29* 1.13* 1.25* 1.21*  CALCIUM 9.0 8.8* 9.0 9.1 8.8*  MG 2.2 2.3 2.3 2.3 2.3   Liver Function Tests: No results for input(s): "AST", "ALT", "ALKPHOS", "BILITOT", "PROT", "ALBUMIN" in the last 168 hours. CBG: No results for input(s): "GLUCAP" in the last 168 hours.  Discharge time spent: greater than 30 minutes.  Signed: Coralie Keens, MD [facility] 09/23/2023

## 2023-09-25 ENCOUNTER — Telehealth: Payer: Self-pay | Admitting: Cardiovascular Disease

## 2023-09-25 DIAGNOSIS — Z0279 Encounter for issue of other medical certificate: Secondary | ICD-10-CM

## 2023-09-25 NOTE — Telephone Encounter (Signed)
 Spoke w/pt regarding FMLA paperwork received [employer]) - pt plans to come into office this afternoon to complete ROI & Billing forms + pay $29 fee. Pt aware that Dr. Salena Saner will return to office tomorrow & remainder of week.  Paperwork left in Dr. Renaye Rakers mailbox.  JB, 09-25-23

## 2023-09-26 NOTE — Telephone Encounter (Signed)
 Pt came into office, 09-25-23, to complete ROI & Billing forms + paid $29 fee.  JB, 09-26-23

## 2023-09-27 NOTE — Telephone Encounter (Signed)
 Completed FMLA [employer]) forms & Billing form faxed & scanned into pt's chart.  JB, 09-27-23

## 2023-09-29 ENCOUNTER — Telehealth: Payer: Self-pay | Admitting: Cardiovascular Disease

## 2023-09-29 NOTE — Telephone Encounter (Signed)
 Returned call to pt she states that she would be able to go back to work 10-04-2023. I will update paperwork fax back.

## 2023-09-29 NOTE — Telephone Encounter (Signed)
  Are you calling in reference to your FMLA or disability form? FMLA   What is your question in regards to FMLA or disability form? Forms were faxed to her job but no return  to work date was on it   Do you need copies of your medical records? no   Are you waiting on a nurse to call you back with results or are you wanting copies of your results? no    Please route to triage.  We will send to [facility] if needed.

## 2023-09-29 NOTE — Telephone Encounter (Signed)
  The patient is calling back and stated that Number 5 is not filled out. She would like to speak with Shanda Bumps.

## 2023-09-29 NOTE — Telephone Encounter (Signed)
 Left vm for pt regarding clarification on FMLA paperwork. Dr. Salena Saner will be back in office on Mon., 10-02-23, early morning only.   Will contact pt Monday morning & relay info to Dr. Tressia Miners, 09-29-23

## 2023-10-03 LAB — THYROGLOBULIN LEVEL: Thyroglobulin: 95 ng/mL — ABNORMAL HIGH

## 2023-10-04 ENCOUNTER — Ambulatory Visit
Admission: RE | Admit: 2023-10-04 | Discharge: 2023-10-04 | Disposition: A | Discharge status: Home / Self Care | Source: Ambulatory Visit | Attending: Student | Admitting: Student

## 2023-10-04 DIAGNOSIS — Z1231 Encounter for screening mammogram for malignant neoplasm of breast: Secondary | ICD-10-CM

## 2023-10-06 ENCOUNTER — Other Ambulatory Visit: Payer: Self-pay | Admitting: Student

## 2023-10-06 DIAGNOSIS — R928 Other abnormal and inconclusive findings on diagnostic imaging of breast: Secondary | ICD-10-CM

## 2023-10-06 NOTE — Telephone Encounter (Signed)
Patient scheduled for OV next week. ?

## 2023-10-10 NOTE — Progress Notes (Unsigned)
 Cardiology Office Note   Date:  10/11/2023  ID:  Cecile, Gillispie 08-19-1945, MRN [id number] PCP:  Hillery Aldo, NP [facility] Cardiologist: Thurmon Fair, MD  Reason for visit: 6-8 week follow-up  History of Present Illness    Julia Mcfarland is a 78 y.o. female with a hx of obesity, paroxysmal atrial fibrillation during hospitalization for COVID/pneumonia in 2021.  Follow-up monitor did not show significant A-fib; therefore anticoagulation was discontinued.  She was hospitalized in January 2024 with heart failure and A-fib with RVR.  Echo showed EF 60 to 65%, severe biatrial dilation, moderate to severe MR and severe TR.  Cardiac MRI that showed biatrial dilation but no evidence of amyloidosis or myocardial scarring. There was moderate central mitral regurgitation was felt to be secondary to atrial functional mechanism.   She was seen by Dr. Royann Shivers on September 03, 2023.  She was on Lasix 60 mg daily and Jardiance.  She did not tolerate Entresto due to itching but is able to tolerate low-dose of ARB.  At that appointment, Toprol increased to 150 mg daily to better rate control her A-fib (100 mg in the morning and 50 mg at night).  Due to borderline low blood pressure, losartan discontinued.    Patient called our office on March 12 with increase in leg swelling.  Dr. Royann Shivers recommended she could take Lasix 120 mg daily for up to 3 days.  Today, patient discusses hospitalization March 12 to March 22.  She presented to the ED with worsening dyspnea on exertion and lower extremity edema.  She was found to be in A-fib with RVR with heart rate 120s, blood pressure 106/74.  Echo with preserved EF.  She had IV diuresis with intermittent hypotension.  She had a cardioversion March 21, converting to normal sinus rhythm.  She is found to have hyperthyroidism, following with endocrinology.  Treated with methimazole.  She was discharged on torsemide 40 mg twice daily.    She states since  discharge, she has been feeling well, back to her normal self.  She says she diuresed 30 to 35 pounds in the hospital.  She did not denies shortness of breath, orthopnea and PND.  She states she has had some allergies and had to use her inhaler this morning for wheezing.  Her weights been stable around 221 pounds.  She weighs herself daily.  She denies bloating.  No chest pain or palpitations.  She has some chronic lower extremity edema.  She had blood work today endocrinology including the metabolic panel.     Objective / Physical Exam   EKG today: Atrial fibrillation, heart rate at 115  Vital signs:  BP 124/70   Pulse (!) 58   Ht 5\' 2"  (1.575 m)   Wt 221 lb 6.4 oz (100.4 kg)   SpO2 98%   BMI 40.49 kg/m     GEN: No acute distress NECK: No carotid bruits CARDIAC: Irregular irregular, tachy, no murmurs RESPIRATORY:  Clear to auscultation without rales, wheezing or rhonchi  EXTREMITIES: 1-2+ non-pitting edema to knees bilaterally  Assessment and Plan   Permanent atrial fibrillation - With morbid obesity, severe biatrial dilation, valvular disease, rate control strategy initially pursued. - During March 2025 admission for A-fib with RVR and CHF, she was found to be hyperthyroid.  Started on methimazole. - DCCV March 2025 inpatient, converting to NSR.   -She is currently on Toprol 25 mg daily.  (HR 53 post cardioversion) - Now in A-fib with heart  rate 115.  Will increase Toprol back up to her prior 100 mg in the morning and 50 mg at night.   - Agree with rate control strategy until thyroid stabilized - then can re-evaluate. - Continue Pradaxa 150 mg twice daily for stroke prevention.  Chronic diastolic heart failure, euvolemic  Mitral regurgitation Tricuspid regurgitation - Continue torsemide 40 mg twice daily and potassium supplementation. - Continue Jardiance 10 mg daily. - Labs taken today and followed by PCP/endocrinology. - Advised to monitor daily weights.  If she gains 3  pounds a day or 5 pounds in a week, increase leg swelling, recommended to contact provider. - Leg swelling looks more secondary to body habitus currently.  Disposition - Follow-up in 3 weeks with Dr. Aneta Mins.  Bring weight/blood pressure/heart rate log to follow-up.   Signed, Cannon Kettle, PA-C  10/11/2023 [facility]

## 2023-10-11 ENCOUNTER — Ambulatory Visit: Payer: Medicare HMO | Attending: Physician Assistant | Admitting: Physician Assistant

## 2023-10-11 ENCOUNTER — Other Ambulatory Visit (HOSPITAL_COMMUNITY): Payer: Self-pay

## 2023-10-11 ENCOUNTER — Encounter: Payer: Self-pay | Admitting: Physician Assistant

## 2023-10-11 VITALS — BP 124/70 | HR 58 | Ht 62.0 in | Wt 221.4 lb

## 2023-10-11 DIAGNOSIS — I5032 Chronic diastolic (congestive) heart failure: Secondary | ICD-10-CM

## 2023-10-11 DIAGNOSIS — I4819 Other persistent atrial fibrillation: Secondary | ICD-10-CM | POA: Diagnosis not present

## 2023-10-11 MED ORDER — METOPROLOL SUCCINATE ER 50 MG PO TB24
ORAL_TABLET | ORAL | 3 refills | Status: DC
Start: 2023-10-11 — End: 2023-11-01

## 2023-10-11 NOTE — Patient Instructions (Signed)
 Medication Instructions:  Start metoprolol succinate 50 mg ( Take 2 Tablets In The Morning and 1 Tablet In the Evening). *If you need a refill on your cardiac medications before your next appointment, please call your pharmacy*  Lab Work: No Labs If you have labs (blood work) drawn today and your tests are completely normal, you will receive your results only by: MyChart Message (if you have MyChart) OR A paper copy in the mail If you have any lab test that is abnormal or we need to change your treatment, we will call you to review the results.  Testing/Procedures: No Testing  Follow-Up: At [facility], you and your health needs are our priority.  As part of our continuing mission to provide you with exceptional heart care, our providers are all part of one team.  This team includes your primary Cardiologist (physician) and Advanced Practice Providers or APPs (Physician Assistants and Nurse Practitioners) who all work together to provide you with the care you need, when you need it.  Your next appointment:   3 week(s)  Provider:   Rise Paganini, NP       We recommend signing up for the patient portal called "MyChart".  Sign up information is provided on this After Visit Summary.  MyChart is used to connect with patients for Virtual Visits (Telemedicine).  Patients are able to view lab/test results, encounter notes, upcoming appointments, etc.  Non-urgent messages can be sent to your provider as well.   To learn more about what you can do with MyChart, go to [email or web address].   Other Instructions       1st Floor: - Lobby - Registration  - Pharmacy  - Lab - Cafe  2nd Floor: - PV Lab - Diagnostic Testing (echo, CT, nuclear med)  3rd Floor: - Vacant  4th Floor: - TCTS (cardiothoracic surgery) - AFib Clinic - Structural Heart Clinic - Vascular Surgery  - Vascular Ultrasound  5th Floor: - HeartCare Cardiology (general and EP) - Clinical Pharmacy for  coumadin, hypertension, lipid, weight-loss medications, and med management appointments    Valet parking services will be available as well.

## 2023-10-18 ENCOUNTER — Other Ambulatory Visit: Payer: Self-pay | Admitting: Student

## 2023-10-18 ENCOUNTER — Ambulatory Visit
Admission: RE | Admit: 2023-10-18 | Discharge: 2023-10-18 | Disposition: A | Discharge status: Home / Self Care | Source: Ambulatory Visit | Attending: Student

## 2023-10-18 DIAGNOSIS — R921 Mammographic calcification found on diagnostic imaging of breast: Secondary | ICD-10-CM

## 2023-10-18 DIAGNOSIS — R928 Other abnormal and inconclusive findings on diagnostic imaging of breast: Secondary | ICD-10-CM

## 2023-10-24 ENCOUNTER — Telehealth: Payer: Self-pay | Admitting: Cardiovascular Disease

## 2023-10-24 NOTE — Telephone Encounter (Signed)
 Spoke w/pt regarding FMLA paperwork received [employer] from [employer] - pt will come into office today or tomorrow to complete ROI form.  Paperwork left with Ms. Cheryl for Dr. Tita Form to complete at convenience.  JB, 10-24-23

## 2023-10-25 ENCOUNTER — Ambulatory Visit
Admission: RE | Admit: 2023-10-25 | Discharge: 2023-10-25 | Disposition: A | Discharge status: Home / Self Care | Source: Ambulatory Visit | Attending: Student | Admitting: Student

## 2023-10-25 DIAGNOSIS — R921 Mammographic calcification found on diagnostic imaging of breast: Secondary | ICD-10-CM

## 2023-10-25 HISTORY — PX: BREAST BIOPSY: SHX20

## 2023-10-25 NOTE — Telephone Encounter (Signed)
 Pt came into office to complete ROI form for [employer].  JB, 10-25-23

## 2023-10-26 LAB — SURGICAL PATHOLOGY

## 2023-10-28 NOTE — Progress Notes (Unsigned)
 Cardiology Office Note:    Date:  11/01/2023   ID:  Julia Mcfarland, DOB 1945-11-11, MRN [id number]  PCP:  Maryellen Snare, NP  Cardiologist:  Luana Rumple, MD     Referring MD: Maryellen Snare, NP   Chief Complaint: follow-up of atrial fibrillation  History of Present Illness:    Julia Mcfarland is a 78 y.o. female with a history of chronic HFpEF, persistent atrial fibrillation on Pradaxa , moderate mitral regurgitation, severe tricuspid regurgitation, chronic venous insufficiency, CKD stage IIIa, hyperthyroidism, arthritis, and morbid obesity who is followed by Dr. Alvis Ba and presents today for follow-up of atrial fibrillation.  Patient was first seen by Cardiology during an admission in 10/2019 for COVID pneumonia where she was found ot have new onset atrial fibrillation. Echo showed LVEF of 55-60% with normal wall motion and no significant valvular disease. She was started on Amiodarone  and Eliquis . Amiodarone  was subsequently stopped.  Event monitor in 02/2020 showed sinus rhythm with paroxysmal atrial fibrillation with RVR. She was admitted again in 07/2022 for acute hypoxic respiratory failure secondary to acute CHF and atrial fibrillation with RVR. Echo showed LVEF of 60-65%, normal RV function with mildly elevated PASP, severe biatrial enlargement, moderate to severe MR, and severe TR concerning for restrictive physiology. She was diuresed with IV Lasix  and treated with Metoprolol  and Digoxin  for rate control. DCCV was not attempted due to questionable compliance with Eliquis . Multiple myeloma panel was done and was negative for M-spike protein. Outpatient PYP scan was equivocal for TTR amyloid. Cardiac MRI in 11/2022 showed normal LV size and function, mild RV enlargement with normal RV function, severe biatrial enlargement, and moderate appearing atrial functional MR but no evidence of amyloid.   She was most recently admitted in 09/2023 for acute CHF in setting of atrial fibrillation with  RVR. Echo showed LVEF of 65-70% with normal wall motion, severe RV enlargement with normal RV function and moderately elevated PASP of 46.1 mmHg, moderate left atrial enlargement, severe right atrial enlargement, moderate MR, and severe TR. She was started on IV Lasix  and continued on Metoprolol . She was aggressively diuresed (30+ lbs) and then underwent TEE/ DCCV with restoration of sinus rhythm. TEE showed LVEF of 60-65%, moderate RV enlargement with mildly reduced RV function, severe biatrial enlargement, moderate atrial functional MR, and severe tricuspid annular dilatation with severe atrial function TR and coaptation gap of 1.19cm due to annular dilatation. She had some bradycardia and hypotension following procedure felt to be due to high dose beta-blocker. Metoprolol  was decreased. She was transitioned to Torsemide  at discharge. Hospitalization was complicated by hyperthyroidism and AKI. She was started on Methimazole .  She was last seen by Phyliss Breen, PA-C, on 10/11/2023 at which time she was was feeling better but was back in atrial fibrillation with rates in the 110s. Metoprolol  was increased and plan was for rate control strategy until thyroid  was stabilized.   Patient presents today for follow-up. She is doing well since last visit. She was previously having dyspnea on exertion but states this has now resolved. She denies any dyspnea at rest either. No orthopnea or PND. She notes some wheezing at night which improves with use of her inhaler.  She states her edema is well controlled on Torsemide  and her weights are stable at home. Her weight is down 3 lbs from office visit earlier this month. No chest pain, palpitations, lightheadedness, dizziness, or syncope. She is compliant with her medications and tolerating them well.    EKGs/Labs/Other Studies Reviewed:  The following studies were reviewed:  PYP Scan 2022/09/04:   By semi-quantitative assessment scan is consistent with increased  heart uptake but less than rib uptake-Grade 1. Heart to contralateral lung ratio is between 1-1.5, indeterminate for amyloid.   Study is equivocal for TTR amyloidosis (visual score of 1/ratio between 1-1.5).   Prior study not available for comparison.   Equivocal study for TTR amyloid.  Cardiac MRI recommended. _______________  Cardiac MRI 11/16/2022: Impressions: 1.  Severe bi atrial enlargement no LAA thrombus 2.  Normal LV size and function LVEF 52% 3.  Mild RVE with low normal function RVEF 48% 4. Delayed enhancement images with normal nulling and no gadolinium uptake No evidence of amyloid 5.  Moderate appearing atrial functional MR 6.  Normal parametric measures 7.  Normal cardiac output 6.3 L/min _______________  TTE 09/14/2023: Impressions: 1. Left ventricular ejection fraction, by estimation, is 65 to 70%. The  left ventricle has normal function. The left ventricle has no regional  wall motion abnormalities. Left ventricular diastolic function could not  be evaluated.   2. Right ventricular systolic function is normal. The right ventricular  size is severely enlarged. There is moderately elevated pulmonary artery  systolic pressure. The estimated right ventricular systolic pressure is  46.1 mmHg.   3. Left atrial size was moderately dilated.   4. Right atrial size was severely dilated.   5. The mitral valve is normal in structure. Moderate mitral valve  regurgitation. No evidence of mitral stenosis.   6. 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   7. The aortic valve is normal in structure. Aortic valve regurgitation is  not visualized. No aortic stenosis is present.   8. The inferior vena cava is dilated in size with <50% respiratory  variability, suggesting right atrial pressure of 15 mmHg.  _______________  TEE/ DCCV 09/22/2023: Impressions: 1. Left ventricular ejection fraction, by estimation, is 60 to 65%. The  left ventricle has normal function.   2. Severe  tricuspid annular dilation. Right ventricular systolic function  is mildly reduced. The right ventricular size is moderately enlarged.   3. Left atrial size was severely dilated. No left atrial/left atrial  appendage thrombus was detected. The LAA emptying velocity was 27 cm/s.   4. Right atrial size was severely dilated.   5. Moderate atrial functional mitral valve regurgitation. . The mitral  valve is degenerative. Moderate mitral valve regurgitation. No evidence of  mitral stenosis.   6. Severe atrial function tricuspid valve regurgitation. There is a  coaptation gap of 1.19cm due to annular dilation. Calculated EROA of  1.87cm^2. The tricuspid valve is abnormal. Tricuspid valve regurgitation  is severe.   7. The aortic valve is tricuspid. Aortic valve regurgitation is trivial.  No aortic stenosis is present.   8. There is mild (Grade II) plaque involving the aortic arch and  descending aorta.   9. Agitated saline contrast bubble study was negative, with no evidence  of any interatrial shunt.  10. 3D performed of the tricuspid valve and demonstrates lack of  coaptation due to annular dilation.   Conclusion(s)/Recommendation(s): No LA/LAA thrombus identified. Successful  cardioversion performed with restoration of normal sinus rhythm.   EKG:  EKG ordered today.   EKG Interpretation Date/Time:  Wednesday November 01 2023 09:12:28 EDT Ventricular Rate:  111 PR Interval:    QRS Duration:  82 QT Interval:  338 QTC Calculation: 459 R Axis:   20  Text Interpretation: Atrial fibrillation with rapid ventricular response with premature ventricular or  aberrantly conducted complexes Possible Anterior infarct , age undetermined  No acute ischemic changes Confirmed by Kierstin January [phone]) on 11/01/2023 12:52:20 PM    Recent Labs: 09/13/2023: B Natriuretic Peptide 359.3 09/14/2023: ALT 21; TSH <0.010 09/18/2023: Hemoglobin 12.5; Platelets 176 09/23/2023: BUN 32; Creatinine, Ser 1.21;  Magnesium 2.3; Potassium 4.0; Sodium 140  Recent Lipid Panel    Component Value Date/Time   CHOL 191 03/19/2020 1112   TRIG 55.0 03/19/2020 1112   HDL 67.50 03/19/2020 1112   CHOLHDL 3 03/19/2020 1112   VLDL 11.0 03/19/2020 1112   LDLCALC 112 (H) 03/19/2020 1112   LDLDIRECT 113.6 08/14/2013 1557    Physical Exam:    Vital Signs: BP 124/70 (BP Location: Left Arm, Patient Position: Sitting)   Pulse (!) 111   Ht 5\' 2"  (1.575 m)   Wt 218 lb 3.2 oz (99 kg)   SpO2 99%   BMI 39.91 kg/m     Wt Readings from Last 3 Encounters:  11/01/23 218 lb 3.2 oz (99 kg)  10/11/23 221 lb 6.4 oz (100.4 kg)  09/23/23 230 lb 11.2 oz (104.6 kg)     General: 78 y.o. obese African-American female in no acute distress. HEENT: Normocephalic and atraumatic. Sclera clear.  Neck: Supple. No carotid bruits. No JVD. Heart: Tachycardic with irregularly irregular rhythm..  No murmurs, gallops, or rubs.  Lungs: No increased work of breathing. Clear to ausculation bilaterally. No wheezes, rhonchi, or rales.  Extremities: Mild non-pitting edema of bilateral lower extremities.  Skin: Warm and dry. Neuro: No focal deficits. Psych: Normal affect. Responds appropriately.   Assessment:    1. Persistent atrial fibrillation (HCC)   2. Chronic heart failure with preserved ejection fraction (HFpEF) (HCC)   3. Severe tricuspid regurgitation   4. Moderate mitral regurgitation   5. Stage 3a chronic kidney disease (HCC)     Plan:    Persistent Atrial Fibrillation Initially diagnosed in 2021 during an admission for COVID pneumonia. She was recently admitted in 09/2023 for rapid atrial fibrillation and acute CHF. She was also diagnosed with hyperthyroidism and was started on Methimazole . S/p TEE/ DCCV on 09/22/2023. Following procedure, she developed bradycardia and hypotension which was felt to be due to high dose beta-blocker. Metoprolol  was decreased. Unfortunately, she was noted to be back in rapid atrial  fibrillation at follow-up earlier this month and Metoprolol  was increased.  - Remains in atrial fibrillation today with ventricular rate of 111. Asymptomatic with this.  - Will increase to Toprol -XL 100mg  in the morning and 100mg  in the evening.  - Continue Pradaxa  150mg  twice daily.  - Will order 3 day Zio monitor to help assess rate control.  - Plan is for rate control until thyroid  is controlled. Explained that it will likely be hard to rate control her until her hyperthyroidism is well controlled. She states she is scheduled to follow-up with a provider for this next month. Her valvular disease and severe biatrial enlargement will also make maintaining sinus rhythm difficult.   Chronic HFpEF Work-up for cardiac amyloidosis in 2024 was negative. Recet TEE in 09/2023 showed LVEF of 60-65%, moderate RV enlargement with mildly reduced RV function, severe biatrial enlargement, moderate atrial functional MR, and severe tricuspid annular dilatation with severe atrial function TR and coaptation gap of 1.19cm due to annular dilatation. - Euvolemic on exam. Weight down 3 lbs from visit earlier this month and 12 lbs from 09/2023. - Continue Torsemide  40mg  twice daily (KCl 20 mEq daily).  - Continue Jardiance  10mg  daily.  Severe Tricuspid Regurgitation Moderate Mitral Regurgitation  TEE in 09/2023 showed severe biatrial enlargement with moderate atrial functional MR and severe tricuspid annular dilatation with severe atrial functional TR and coaptation gap of 1.19cm due to annular dilatation. Per Dr. Daril Edge note in the hospital, tricuspid TEER score is 1 2 due to septolateral gap and image quality suggestion of moderate chance of procedural success.  - Continue management of CHF as above.   CKD Stage IIIa Baseline creatinine around 1.0 to 1.2.  Disposition: Follow up in 1 month.   Signed, Casimer Clear, PA-C  11/01/2023 12:56 PM    [facility]

## 2023-10-30 NOTE — Telephone Encounter (Signed)
 Called Iris Brice (pt's daughter- dpr) ID X [phone]. Discussed FMLA paperwork and parameters needed/specific needs. Informed Iris that I will give this paperwork to our [facility] specialist and it should be faxed into [facility] provider soon. She verbalized understanding.

## 2023-10-30 NOTE — Telephone Encounter (Signed)
 I scanned [employer] FMLA form into chart and faxed to [employer].  Original sent to patient.

## 2023-11-01 ENCOUNTER — Other Ambulatory Visit (HOSPITAL_COMMUNITY): Payer: Self-pay

## 2023-11-01 ENCOUNTER — Ambulatory Visit: Attending: Student | Admitting: Student

## 2023-11-01 ENCOUNTER — Encounter: Payer: Self-pay | Admitting: Student

## 2023-11-01 ENCOUNTER — Ambulatory Visit

## 2023-11-01 ENCOUNTER — Telehealth: Payer: Self-pay | Admitting: Cardiovascular Disease

## 2023-11-01 VITALS — BP 124/70 | HR 111 | Ht 62.0 in | Wt 218.2 lb

## 2023-11-01 DIAGNOSIS — I4819 Other persistent atrial fibrillation: Secondary | ICD-10-CM

## 2023-11-01 DIAGNOSIS — I340 Nonrheumatic mitral (valve) insufficiency: Secondary | ICD-10-CM | POA: Diagnosis not present

## 2023-11-01 DIAGNOSIS — I071 Rheumatic tricuspid insufficiency: Secondary | ICD-10-CM | POA: Diagnosis not present

## 2023-11-01 DIAGNOSIS — I5032 Chronic diastolic (congestive) heart failure: Secondary | ICD-10-CM

## 2023-11-01 DIAGNOSIS — N1831 Chronic kidney disease, stage 3a: Secondary | ICD-10-CM

## 2023-11-01 MED ORDER — METOPROLOL SUCCINATE ER 100 MG PO TB24
100.0000 mg | ORAL_TABLET | Freq: Two times a day (BID) | ORAL | 6 refills | Status: AC
Start: 2023-11-01 — End: ?
  Filled 2023-11-01: qty 60, 30d supply, fill #0

## 2023-11-01 MED ORDER — METOPROLOL SUCCINATE ER 100 MG PO TB24: 100.0000 mg | ORAL_TABLET | Freq: Every day | ORAL | 6 refills | Status: DC

## 2023-11-01 NOTE — Telephone Encounter (Signed)
 We increased her Toprol -XL to 100mg  twice daily. She was previously taking 100mg  in the morning and 50mg  in the evening.   Thank you!

## 2023-11-01 NOTE — Telephone Encounter (Signed)
 Spoke with courtney at the pharmacy and she would like clarity on metoprolol  instructions.   There are two sets of instructions

## 2023-11-01 NOTE — Patient Instructions (Signed)
 Medication Instructions:  INCREASE METOPROLOL  100MG  TWICE DAILY *If you need a refill on your cardiac medications before your next appointment, please call your pharmacy*  Lab Work:   Testing/Procedures: NONE    NONE  Follow-Up: At [facility], you and your health needs are our priority.  As part of our continuing mission to provide you with exceptional heart care, our providers are all part of one team.  This team includes your primary Cardiologist (physician) and Advanced Practice Providers or APPs (Physician Assistants and Nurse Practitioners) who all work together to provide you with the care you need, when you need it.  Your next appointment:   1 month(s)  Provider:   Callie Goodrich, PA-C   ZIO XT- Long Term Monitor Instructions  Your physician has requested you wear a ZIO patch monitor for 3 days.  This is a single patch monitor. [employer] supplies one patch monitor per enrollment. Additional stickers are not available. Please do not apply patch if you will be having a Nuclear Stress Test,  Echocardiogram, Cardiac CT, MRI, or Chest Xray during the period you would be wearing the  monitor. The patch cannot be worn during these tests. You cannot remove and re-apply the  ZIO XT patch monitor.  Your ZIO patch monitor will be mailed 3 day USPS to your address on file. It may take 3-5 days  to receive your monitor after you have been enrolled.  Once you have received your monitor, please review the enclosed instructions. Your monitor  has already been registered assigning a specific monitor serial # to you.  Billing and Patient Assistance Program Information  We have supplied [employer] with any of your insurance information on file for billing purposes. [employer] offers a sliding scale Patient Assistance Program for patients that do not have  insurance, or whose insurance does not completely cover the cost of the ZIO monitor.  You must apply for the Patient Assistance Program  to qualify for this discounted rate.  To apply, please call [employer] at [phone], select option 4, select option 2, ask to apply for  Patient Assistance Program. Sanna Crystal will ask your household income, and how many people  are in your household. They will quote your out-of-pocket cost based on that information.  [employer] will also be able to set up a 60-month, interest-free payment plan if needed.  Applying the monitor   Shave hair from upper left chest.  Hold abrader disc by orange tab. Rub abrader in 40 strokes over the upper left chest as  indicated in your monitor instructions.  Clean area with 4 enclosed alcohol pads. Let dry.  Apply patch as indicated in monitor instructions. Patch will be placed under collarbone on left  side of chest with arrow pointing upward.  Rub patch adhesive wings for 2 minutes. Remove white label marked "1". Remove the white  label marked "2". Rub patch adhesive wings for 2 additional minutes.  While looking in a mirror, press and release button in center of patch. A small green light will  flash 3-4 times. This will be your only indicator that the monitor has been turned on.  Do not shower for the first 24 hours. You may shower after the first 24 hours.  Press the button if you feel a symptom. You will hear a small click. Record Date, Time and  Symptom in the Patient Logbook.  When you are ready to remove the patch, follow instructions on the last 2 pages of Patient  Logbook. Stick  patch monitor onto the last page of Patient Logbook.  Place Patient Logbook in the blue and white box. Use locking tab on box and tape box closed  securely. The blue and white box has prepaid postage on it. Please place it in the mailbox as  soon as possible. Your physician should have your test results approximately 7 days after the  monitor has been mailed back to [facility].  Call [facility] Customer Care at [phone] if you have questions regarding  your ZIO XT  patch monitor. Call them immediately if you see an orange light blinking on your  monitor.  If your monitor falls off in less than 4 days, contact our Monitor department at [phone].  If your monitor becomes loose or falls off after 4 days call [employer] at [phone] for  suggestions on securing your monitor

## 2023-11-01 NOTE — Telephone Encounter (Signed)
 Called and verified with Courney Metoprolol  Succ Toprol -XL to 100mg  twice daily. Verbalized understanding

## 2023-11-01 NOTE — Progress Notes (Unsigned)
 ZIO serial # [id number] from office inventory applied to patient.  Dr. Alvis Ba to read.

## 2023-11-01 NOTE — Telephone Encounter (Signed)
 Pt c/o medication issue:  1. Name of Medication:   metoprolol  succinate (TOPROL -XL) 100 MG 24 hr tablet   2. How are you currently taking this medication (dosage and times per day)?   3. Are you having a reaction (difficulty breathing--STAT)?   4. What is your medication issue?   Caller Julia Mcfarland) wants a call back to confirm directions for this medication.

## 2023-11-11 ENCOUNTER — Telehealth: Payer: Self-pay | Admitting: Cardiology

## 2023-11-11 NOTE — Telephone Encounter (Signed)
 Notified by [employer] that patient's recent monitor showed an episode of rapid afib on May 1st around 5:28 BPM with average HR 185 BPM. Reportedly lasted 60 seconds.   Patient has known history of afib. Monitor was ordered to assess rate control. Per chart review, patient has hyperthyroidism which has been poorly controlled recently. Felt this is contributing to her tachycardia.   Monitor formal read pending. Patient is on metoprolol  succinate 100 mg BID. Will defer changes in medication management to ordering provider  Debria Fang, PA-C 11/11/2023 1:38 PM

## 2023-11-14 DIAGNOSIS — I4819 Other persistent atrial fibrillation: Secondary | ICD-10-CM | POA: Diagnosis not present

## 2023-11-19 ENCOUNTER — Ambulatory Visit: Payer: Self-pay | Admitting: Student

## 2023-11-19 NOTE — Telephone Encounter (Signed)
 Formal read from monitor has been completed. Please see result note for recommendations.  Izaak Sahr E Marchele Decock, PA-C 11/19/2023 8:47 AM

## 2023-11-27 ENCOUNTER — Other Ambulatory Visit (HOSPITAL_COMMUNITY): Payer: Self-pay | Admitting: Cardiology

## 2023-11-29 ENCOUNTER — Telehealth: Payer: Self-pay | Admitting: Cardiovascular Disease

## 2023-11-29 MED ORDER — EMPAGLIFLOZIN 10 MG PO TABS: 10.0000 mg | ORAL_TABLET | Freq: Every day | ORAL | 6 refills | Status: AC

## 2023-11-29 NOTE — Telephone Encounter (Signed)
 Spoke to patient she stated she needs a refill for Jardiance .30 day refill sent to her pharmacy.

## 2023-11-29 NOTE — Telephone Encounter (Signed)
*  STAT* If patient is at the pharmacy, call can be transferred to refill team.   1. Which medications need to be refilled? (please list name of each medication and dose if known) empagliflozin  (JARDIANCE ) 10 MG TABS tablet   2. Which pharmacy/location (including street and city if local pharmacy) is medication to be sent to? [id number] - [place] ([place]), [place] - [address] Phone: [phone]  Fax: [phone]     3. Do they need a 30 day or 90 day supply? 30 Pt is out of medication

## 2023-12-03 NOTE — Progress Notes (Signed)
 Cardiology Office Note:    Date:  12/13/2023   ID:  [name], DOB 1946-04-09, MRN [id number]  PCP:  Maryellen Snare, NP  Cardiologist:  Luana Rumple, MD     Referring MD: Maryellen Snare, NP   Chief Complaint: follow-up of atrial fibrillation  History of Present Illness:    Julia Mcfarland is a 78 y.o. female with a history of chronic HFpEF, persistent atrial fibrillation on Pradaxa , moderate mitral regurgitation, severe tricuspid regurgitation, chronic venous insufficiency, CKD stage IIIa, hyperthyroidism, arthritis, and morbid obesity who is followed by Dr. Alvis Ba and presents today for follow-up of atrial fibrillation.   Patient was first seen by Cardiology during an admission in 10/2019 for COVID pneumonia where she was found ot have new onset atrial fibrillation. Echo showed LVEF of 55-60% with normal wall motion and no significant valvular disease. She was started on Amiodarone  and Eliquis . Amiodarone  was subsequently stopped.  Event monitor in 02/2020 showed sinus rhythm with paroxysmal atrial fibrillation with RVR. She was admitted again in 07/2022 for acute hypoxic respiratory failure secondary to acute CHF and atrial fibrillation with RVR. Echo showed LVEF of 60-65%, normal RV function with mildly elevated PASP, severe biatrial enlargement, moderate to severe MR, and severe TR concerning for restrictive physiology. She was diuresed with IV Lasix  and treated with Metoprolol  and Digoxin  for rate control. DCCV was not attempted due to questionable compliance with Eliquis . Multiple myeloma panel was done and was negative for M-spike protein. Outpatient PYP scan was equivocal for TTR amyloid. Cardiac MRI in 11/2022 showed normal LV size and function, mild RV enlargement with normal RV function, severe biatrial enlargement, and moderate appearing atrial functional MR but no evidence of amyloid.    She was most recently admitted in 09/2023 for acute CHF in setting of atrial fibrillation with  RVR. Echo showed LVEF of 65-70% with normal wall motion, severe RV enlargement with normal RV function and moderately elevated PASP of 46.1 mmHg, moderate left atrial enlargement, severe right atrial enlargement, moderate MR, and severe TR. She was started on IV Lasix  and continued on Metoprolol . She was aggressively diuresed (30+ lbs) and then underwent TEE/ DCCV with restoration of sinus rhythm. TEE showed LVEF of 60-65%, moderate RV enlargement with mildly reduced RV function, severe biatrial enlargement, moderate atrial functional MR, and severe tricuspid annular dilatation with severe atrial function TR and coaptation gap of 1.19cm due to annular dilatation. She had some bradycardia and hypotension following procedure felt to be due to high dose beta-blocker. Metoprolol  was decreased. She was transitioned to Torsemide  at discharge. Hospitalization was complicated by hyperthyroidism and AKI. She was started on Methimazole .  At follow-up visit in early 10/2023, she was feeling better but was back in atrial fibrillation with rates in the 110s. Metoprolol  was increased and plan was for rate control strategy until thyroid  was stabilized. She was last seen by me on 11/01/2023 at which time she reported doing well with resolution of shortness of breath and well controlled lower extremity. However, she was still in rapid atrial fibrillation. Metoprolol  was increased once again. 3-day Zio monitor was ordered to help assess rate control but it was felt that rate control would be difficult until her hyperthyroidism was well controlled. Zio monitor showed continuous atrial fibrillation. Average heart rate was around 130-140 bpm on the first 2 days but was well controlled on day 3 with average heart rate of 85 bpm.  Patient presents today for follow-up. She continues to do well. She had  one episode of indigestion recently after a meal but no chest pain. No shortness of breath, orthopnea, PND. She has chronic edema which  is stable and much better than it was a couple of months ago. She remains in atrial fibrillation today but rate controlled, and she is asymptomatic with this. No palpitations, lightheadedness/ dizziness, or syncope.    EKGs/Labs/Other Studies Reviewed:    The following studies were reviewed:  PYP Scan 08-28-2022:   By semi-quantitative assessment scan is consistent with increased heart uptake but less than rib uptake-Grade 1. Heart to contralateral lung ratio is between 1-1.5, indeterminate for amyloid.   Study is equivocal for TTR amyloidosis (visual score of 1/ratio between 1-1.5).   Prior study not available for comparison.   Equivocal study for TTR amyloid.  Cardiac MRI recommended. _______________   Cardiac MRI 11/16/2022: Impressions: 1.  Severe bi atrial enlargement no LAA thrombus 2.  Normal LV size and function LVEF 52% 3.  Mild RVE with low normal function RVEF 48% 4. Delayed enhancement images with normal nulling and no gadolinium uptake No evidence of amyloid 5.  Moderate appearing atrial functional MR 6.  Normal parametric measures 7.  Normal cardiac output 6.3 L/min _______________   TTE 09/14/2023: Impressions: 1. Left ventricular ejection fraction, by estimation, is 65 to 70%. The  left ventricle has normal function. The left ventricle has no regional  wall motion abnormalities. Left ventricular diastolic function could not  be evaluated.   2. Right ventricular systolic function is normal. The right ventricular  size is severely enlarged. There is moderately elevated pulmonary artery  systolic pressure. The estimated right ventricular systolic pressure is  46.1 mmHg.   3. Left atrial size was moderately dilated.   4. Right atrial size was severely dilated.   5. The mitral valve is normal in structure. Moderate mitral valve  regurgitation. No evidence of mitral stenosis.   6. 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   7. The aortic valve is normal in structure.  Aortic valve regurgitation is  not visualized. No aortic stenosis is present.   8. The inferior vena cava is dilated in size with <50% respiratory  variability, suggesting right atrial pressure of 15 mmHg.  _______________   TEE/ DCCV 09/22/2023: Impressions: 1. Left ventricular ejection fraction, by estimation, is 60 to 65%. The  left ventricle has normal function.   2. Severe tricuspid annular dilation. Right ventricular systolic function  is mildly reduced. The right ventricular size is moderately enlarged.   3. Left atrial size was severely dilated. No left atrial/left atrial  appendage thrombus was detected. The LAA emptying velocity was 27 cm/s.   4. Right atrial size was severely dilated.   5. Moderate atrial functional mitral valve regurgitation. . The mitral  valve is degenerative. Moderate mitral valve regurgitation. No evidence of  mitral stenosis.   6. Severe atrial function tricuspid valve regurgitation. There is a  coaptation gap of 1.19cm due to annular dilation. Calculated EROA of  1.87cm^2. The tricuspid valve is abnormal. Tricuspid valve regurgitation  is severe.   7. The aortic valve is tricuspid. Aortic valve regurgitation is trivial.  No aortic stenosis is present.   8. There is mild (Grade II) plaque involving the aortic arch and  descending aorta.   9. Agitated saline contrast bubble study was negative, with no evidence  of any interatrial shunt.  10. 3D performed of the tricuspid valve and demonstrates lack of  coaptation due to annular dilation.   Conclusion(s)/Recommendation(s): No LA/LAA  thrombus identified. Successful  cardioversion performed with restoration of normal sinus rhythm.  _______________  Maximo Spar Monitor 11/01/2023 to 11/04/2023:   The dominant rhythm is atrial fibrillation, largely with rapid ventricular rates, although normal ventricular response is seen during nighttime hours.  Improved rate control is seen in the last day of the recording.  On  the first 2 days of recording the average heart rate was around 130-140 bpm.  On the last day of recording the average heart rate was approximately 85 bpm.   There are rare premature ventricular beats.  There is no evidence of ventricular tachycardia.   There are no episodes of bradycardia or pauses.   Abnormal arrhythmia monitor due to atrial fibrillation with rapid ventricular response.  Rate control improved during the last 24 hours of recording.    EKG:  EKG ordered today.   EKG Interpretation Date/Time:  Wednesday December 13 2023 08:56:46 EDT Ventricular Rate:  87 PR Interval:    QRS Duration:  72 QT Interval:  366 QTC Calculation: 440 R Axis:   -8  Text Interpretation: Atrial fibrillation  Non-specific T wave changes Confirmed by Darragh Nay [phone]) on 12/13/2023 9:10:51 AM    Recent Labs: 09/13/2023: B Natriuretic Peptide 359.3 09/14/2023: ALT 21; TSH <0.010 09/18/2023: Hemoglobin 12.5; Platelets 176 09/23/2023: BUN 32; Creatinine, Ser 1.21; Magnesium 2.3; Potassium 4.0; Sodium 140  Recent Lipid Panel    Component Value Date/Time   CHOL 191 03/19/2020 1112   TRIG 55.0 03/19/2020 1112   HDL 67.50 03/19/2020 1112   CHOLHDL 3 03/19/2020 1112   VLDL 11.0 03/19/2020 1112   LDLCALC 112 (H) 03/19/2020 1112   LDLDIRECT 113.6 08/14/2013 1557    Physical Exam:    Vital Signs: BP 106/71 (BP Location: Left Arm, Patient Position: Sitting, Cuff Size: Large)   Pulse 87   Ht 5' 2 (1.575 m)   Wt 214 lb 3.2 oz (97.2 kg)   SpO2 98%   BMI 39.18 kg/m     Wt Readings from Last 3 Encounters:  12/13/23 214 lb 3.2 oz (97.2 kg)  11/01/23 218 lb 3.2 oz (99 kg)  10/11/23 221 lb 6.4 oz (100.4 kg)     General: 78 y.o. female in no acute distress. HEENT: Normocephalic and atraumatic. Sclera clear.  Neck: Supple. No carotid bruits. No JVD. Heart: Irregularly irregular rhythm with normal rate.  Very faint systolic murmur.  Lungs: No increased work of breathing. Clear to ausculation  bilaterally. No wheezes, rhonchi, or rales.  Extremities: Mild non-pitting edema of bilateral lower extremities.  Skin: Warm and dry. Neuro: No focal deficits. Psych: Normal affect. Responds appropriately.   Assessment:    1. Persistent atrial fibrillation (HCC)   2. Chronic heart failure with preserved ejection fraction (HFpEF) (HCC)   3. Severe tricuspid regurgitation   4. Moderate mitral regurgitation   5. Stage 3a chronic kidney disease (CKD) (HCC)     Plan:    Persistent Atrial Fibrillation Initially diagnosed in 2021 during an admission for COVID pneumonia. She was recently admitted in 09/2023 for rapid atrial fibrillation and acute CHF. She was also diagnosed with hyperthyroidism and was started on Methimazole . S/p TEE/ DCCV on 09/22/2023. Following procedure, she developed bradycardia and hypotension which was felt to be due to high dose beta-blocker. Metoprolol  was decreased. Unfortunately, she was noted to be back in rapid atrial fibrillation at follow-up visit in 10/2023. Metoprolol  was up-titrated twice.  - Rate controlled. - Continue Toprol -XL 100mg  twice daily.   - Continue Pradaxa  150mg   twice daily.  - Plan is for rate control until thyroid  is controlled. She states she is scheduled to have labs rechecked next week. Will follow-up with her in 3 months and can discuss repeat DCCV at that time if thyroid  is controlled. However, her valvular disease and severe biatrial enlargement will also likely make maintaining sinus rhythm difficult.   Chronic HFpEF Work-up for cardiac amyloidosis in 2024 was negative. Recet TEE in 09/2023 showed LVEF of 60-65%, moderate RV enlargement with mildly reduced RV function, severe biatrial enlargement, moderate atrial functional MR, and severe tricuspid annular dilatation with severe atrial function TR and coaptation gap of 1.19cm due to annular dilatation. - Euvolemic on exam. Weight down 4lbs from last visit. - Continue Torsemide  40mg  twice daily  (KCl 20 mEq daily).  - Continue Losartan  12.5mg  daily.  - Continue Jardiance  10mg  daily.    Severe Tricuspid Regurgitation Moderate Mitral Regurgitation  TEE in 09/2023 showed severe biatrial enlargement with moderate atrial functional MR and severe tricuspid annular dilatation with severe atrial functional TR and coaptation gap of 1.19cm due to annular dilatation. Per Dr. Daril Edge note in the hospital, tricuspid TEER score is 1 2 due to septolateral gap and image quality suggestive of moderate chance of procedural success.  - Continue management of CHF as above.  - Can repeat Echo in 09/2024.   CKD Stage IIIa Baseline creatinine around 1.0 to 1.2.  Disposition: Follow up in 3 months.   Signed, Casimer Clear, PA-C  12/13/2023 12:17 PM    [facility]

## 2023-12-12 ENCOUNTER — Other Ambulatory Visit (HOSPITAL_COMMUNITY): Payer: Self-pay

## 2023-12-13 ENCOUNTER — Encounter: Payer: Self-pay | Admitting: Student

## 2023-12-13 ENCOUNTER — Ambulatory Visit: Attending: Cardiology | Admitting: Student

## 2023-12-13 VITALS — BP 106/71 | HR 87 | Ht 62.0 in | Wt 214.2 lb

## 2023-12-13 DIAGNOSIS — I340 Nonrheumatic mitral (valve) insufficiency: Secondary | ICD-10-CM | POA: Diagnosis not present

## 2023-12-13 DIAGNOSIS — I4819 Other persistent atrial fibrillation: Secondary | ICD-10-CM | POA: Diagnosis not present

## 2023-12-13 DIAGNOSIS — I071 Rheumatic tricuspid insufficiency: Secondary | ICD-10-CM | POA: Diagnosis not present

## 2023-12-13 DIAGNOSIS — I5032 Chronic diastolic (congestive) heart failure: Secondary | ICD-10-CM | POA: Diagnosis not present

## 2023-12-13 DIAGNOSIS — N1831 Chronic kidney disease, stage 3a: Secondary | ICD-10-CM

## 2023-12-13 NOTE — Patient Instructions (Addendum)
  Medication Instructions:  None *If you need a refill on your cardiac medications before your next appointment, please call your pharmacy*  Lab Work: None If you have labs (blood work) drawn today and your tests are completely normal, you will receive your results only by: MyChart Message (if you have MyChart) OR A paper copy in the mail If you have any lab test that is abnormal or we need to change your treatment, we will call you to review the results.  Testing/Procedures: None  Follow-Up: At [facility], you and your health needs are our priority.  As part of our continuing mission to provide you with exceptional heart care, our providers are all part of one team.  This team includes your primary Cardiologist (physician) and Advanced Practice Providers or APPs (Physician Assistants and Nurse Practitioners) who all work together to provide you with the care you need, when you need it.  Your next appointment:   3 month(s)  Provider:   Callie Goodrich, PA-C   Other Instructions   Heart Failure Education: Weigh yourself EVERY morning after you go to the bathroom but before you eat or drink anything. Write this number down in a weight log/diary. If you gain 3 pounds overnight or 5 pounds in a week, call the office. Take your medicines as prescribed. If you have concerns about your medications, please call us  before you stop taking them.  Eat low salt foods--Limit salt (sodium) to 2000 mg per day. This will help prevent your body from holding onto fluid. Read food labels as many processed foods have a lot of sodium, especially canned goods and prepackaged meats. If you would like some assistance choosing low sodium foods, we would be happy to set you up with a nutritionist. Limit all fluids for the day to less than 2 liters (64 ounces). Fluid includes all drinks, coffee, juice, ice chips, soup, jello, and all other liquids. Stay as active as you can everyday. Staying active  will give you more energy and make your muscles stronger. Start with 5 minutes at a time and work your way up to 30 minutes a day. Break up your activities--do some in the morning and some in the afternoon. Start with 3 days per week and work your way up to 5 days as you can.  If you have chest pain, feel short of breath, dizzy, or lightheaded, STOP. If you don't feel better after a short rest, call 911. If you do feel better, call the office to let us  know you have symptoms with exercise.

## 2023-12-26 ENCOUNTER — Other Ambulatory Visit: Payer: Self-pay | Admitting: Internal Medicine

## 2023-12-26 DIAGNOSIS — E041 Nontoxic single thyroid nodule: Secondary | ICD-10-CM

## 2023-12-27 ENCOUNTER — Telehealth: Payer: Self-pay | Admitting: Cardiovascular Disease

## 2023-12-27 MED ORDER — FLUCONAZOLE 100 MG PO TABS: 100.0000 mg | ORAL_TABLET | ORAL | 3 refills | Status: AC

## 2023-12-27 NOTE — Telephone Encounter (Signed)
 Yes please. Let's do 100 mg every 72 hours for 3 doses (and give her 3 refills for future recurrences please)

## 2023-12-27 NOTE — Telephone Encounter (Signed)
 Pt c/o medication issue:  1. Name of Medication: empagliflozin  (JARDIANCE ) 10 MG TABS tablet   2. How are you currently taking this medication (dosage and times per day)? As written   3. Are you having a reaction (difficulty breathing--STAT)? No   4. What is your medication issue? Pt states this medication is causing a yeast infection and she's not sure if she could continue taking it. Please advise

## 2023-12-27 NOTE — Telephone Encounter (Signed)
 Called and spoke to pt. Full name and DOB verified.  Pt noticed that she has had some weird discharge. Sometimes she uses a pad for urinary issues and she noted a little discharge - yellowish tinge.  Reached out to Dr. JAYSON for possible diflucan  script. Per Dr. JAYSON: Yes please. Let's do 100 mg every 72 hours for 3 doses (and give her 3 refills for future recurrences please)   Pt aware and verbalized understanding. Rx sent to preferred pharmacy.

## 2024-01-02 ENCOUNTER — Other Ambulatory Visit (HOSPITAL_COMMUNITY): Payer: Self-pay

## 2024-01-31 ENCOUNTER — Inpatient Hospital Stay
Admission: RE | Admit: 2024-01-31 | Discharge: 2024-01-31 | Disposition: A | Discharge status: Home / Self Care | Source: Ambulatory Visit | Attending: Internal Medicine | Admitting: Internal Medicine

## 2024-01-31 ENCOUNTER — Other Ambulatory Visit (HOSPITAL_COMMUNITY)
Admission: RE | Admit: 2024-01-31 | Discharge: 2024-01-31 | Disposition: A | Discharge status: Home / Self Care | Source: Ambulatory Visit | Attending: Interventional Radiology | Admitting: Interventional Radiology

## 2024-01-31 DIAGNOSIS — E041 Nontoxic single thyroid nodule: Secondary | ICD-10-CM | POA: Insufficient documentation

## 2024-02-02 LAB — CYTOLOGY - NON PAP

## 2024-02-09 ENCOUNTER — Ambulatory Visit: Admitting: Pulmonary Disease

## 2024-02-19 NOTE — Progress Notes (Signed)
 Cardiology Office Note:    Date:  02/28/2024   ID:  Vinia, Jemmott Oct 31, 1945, MRN [id number]  PCP:  Campbell Reynolds, NP  Cardiologist:  Jerel Balding, MD     Referring MD: Campbell Reynolds, NP   Chief Complaint: follow-up of CHF and atrial fibrillation  History of Present Illness:    Julia Mcfarland is a 78 y.o. female with a history of chronic HFpEF, persistent atrial fibrillation on Pradaxa , moderate mitral regurgitation, severe tricuspid regurgitation, chronic venous insufficiency, CKD stage IIIa, hyperthyroidism, arthritis, and morbid obesity who is followed by Dr. Balding and presents today for routine follow-up of CHF and atrial fibrillation.      Chronic HFpEF - Admitted in 07/2022 for acute hypoxic respiratory failure secondary to acute CHF and atrial fibrillation with RVR. Echo showed LVEF of 60-65%, normal RV function with mildly elevated PASP, severe biatrial enlargement, moderate to severe MR, and severe TR concerning for restrictive physiology. There was concern for amyloid. Multiple myeloma panel was done and was negative for M-spike protein. Outpatient PYP scan in 08/2023 was equivocal for TTR amyloid. Cardiac MRI in 11/2022 showed normal LV size and function, mild RV enlargement with normal RV function, severe biatrial enlargement, and moderate appearing atrial functional MR but no evidence of amyloid.  - Admitted in 09/2023 for acute CHF in setting of atrial fibrillation with RVR. TTE showed LVEF of 65-70% with normal wall motion, severe RV enlargement with normal RV function and moderately elevated PASP of 46.1 mmHg, moderate left atrial enlargement, severe right atrial enlargement, moderate MR, and severe TR. She was aggressive diuresed over 30 lbs. TEE during admission showed LVEF of 60-65%, moderate RV enlargement with mildly reduced RV function, severe biatrial enlargement, moderate atrial functional MR, and severe tricuspid annular dilatation with severe atrial function TR  and coaptation gap of 1.19cm due to annular dilatation.   Persistent Atrial Fibrillation - Initially diagnosed in 10/2019 during an admission for COVID pneumonia. Echo 55-60% at that time. She was started on Amiodarone  and Eliquis  but Amiodarone  was subsequently stopped.  - Event monitor in 02/2020 sinus rhythm with paroxysmal atrial fibrillation with RVR. - Admitted in 07/2022 for CHF and atrial fibrillation with RVR. DCCV was not attempted due to questionable compliance with Eliquis .  - Admitted again in 09/2023 for CHF in setting of atrial fibrillation with RVR. She was aggressively diuresed (30+ lbs) and then underwent TEE/ DCCV with restoration of sinus rhythm. She had some bradycardia following DCCV which was felt to be due to high dose beta-blocker. Metoprolol  was decreased. Hospitalized was complicated by hyperthyroidism and she was started on Methimazole .  - She had early return of atrial fibrillation with rates in the 110s noted at office visit in 10/2023.  Plan was for rate control until thyroid  was stabilized.  - 3 day Zio Monitor from 11/01/2023 to 11/04/2023 to help assess for rate control showed continuous atrial fibrillation. Average heart rate was around 130-140 bpm on the first 2 days but was well controlled on day 3 with average heart rate of 85 bpm.   Valvular Disease - TTE in 03/2020 showed no significant valvular disease (only mild aortic sclerosis, trivial MR, and mild TR). - TTE in 08/2022 showed moderate to severe MR and severe TR. - TTE in 09/2023 showed moderate MR and severe TR. TEE at that time showed moderate functional MR and severe atrial functional TR with coaptation gap of 1.19cm due to annular dilatation.   Hyperthyroidism - Diagnosed in 3.2025 during  admission for acute CHF and atrial fibrillation with RVR. TSH undetectable at <0.010 at that time with free T4 of 3.26, total T3 of 152, thyroglobulin 95, and thyroglobulin antibody of <1.0. Started on Methimazole . - TSH 7.050  on repeat labs in 12/2023 (per [facility]).  - Thyroid  biopsy in 01/2024 showed a benign follicular nodule but no malignancy.  - Followed by Endocrinology.     Patient is followed for chronic HFpEF and atrial fibrillation as described above. She was recently admitted in 09/2023 for acute CHF in setting of rapid atrial fibrillation. She was aggressively diuresed and then underwent TEE/ DCCV with restoration of sinus rhythm. She was also noted to have hyperthyroidism and was started on Methimazole . She unfortunately had quick return of atrial fibrillation in 10/2023 likely due to hyperthyroidism. Plan was for rate control until thyroid  was well controlled. She was last seen by me in 12/2023 at which time she was still in atrial fibrillation but was rate controlled and was otherwise stable from a cardiac standpoint.   Patient presents today for follow-up. Overall, doing well from a cardiac standpoint. She remains in atrial fibrillation but rate controlled. She report occasional brief episodes of heart racing but this does not occur often. No significant palpitations. No chest pain. She has mild dyspnea on exertion when she overdoes it but no activities with routine activities around the house. She is able to vacuum her house and due yard work (including push mow around her flower beds) with no significant shortness of breath. No orthopnea or PND. She has chronic lower extremity edema but states it is stable. No lightheadedness, dizziness, or syncope.   It sounds like her thyroid  is better controlled. Most recent TSH on 12/20/2023 per [facility] was 7.050 which is mildly elevated but previously undetectable at <0.010 in 09/2023. She had a thyroid  biopsy on 01/31/2024 which showed a benign follicular nodule but no malignancy. She is scheduled to follow back up with Endocrinology next month.  EKGs/Labs/Other Studies Reviewed:    The following studies were reviewed:  PYP Scan Sep 11, 2022:   By semi-quantitative assessment scan is  consistent with increased heart uptake but less than rib uptake-Grade 1. Heart to contralateral lung ratio is between 1-1.5, indeterminate for amyloid.   Study is equivocal for TTR amyloidosis (visual score of 1/ratio between 1-1.5).   Prior study not available for comparison.   Equivocal study for TTR amyloid.  Cardiac MRI recommended. _______________   Cardiac MRI 11/16/2022: Impressions: 1.  Severe bi atrial enlargement no LAA thrombus 2.  Normal LV size and function LVEF 52% 3.  Mild RVE with low normal function RVEF 48% 4. Delayed enhancement images with normal nulling and no gadolinium uptake No evidence of amyloid 5.  Moderate appearing atrial functional MR 6.  Normal parametric measures 7.  Normal cardiac output 6.3 L/min _______________   TTE 09/14/2023: Impressions: 1. Left ventricular ejection fraction, by estimation, is 65 to 70%. The  left ventricle has normal function. The left ventricle has no regional  wall motion abnormalities. Left ventricular diastolic function could not  be evaluated.   2. Right ventricular systolic function is normal. The right ventricular  size is severely enlarged. There is moderately elevated pulmonary artery  systolic pressure. The estimated right ventricular systolic pressure is  46.1 mmHg.   3. Left atrial size was moderately dilated.   4. Right atrial size was severely dilated.   5. The mitral valve is normal in structure. Moderate mitral valve  regurgitation. No evidence of  mitral stenosis.   6. 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   7. The aortic valve is normal in structure. Aortic valve regurgitation is  not visualized. No aortic stenosis is present.   8. The inferior vena cava is dilated in size with <50% respiratory  variability, suggesting right atrial pressure of 15 mmHg.  _______________   TEE/ DCCV 09/22/2023: Impressions: 1. Left ventricular ejection fraction, by estimation, is 60 to 65%. The  left ventricle has  normal function.   2. Severe tricuspid annular dilation. Right ventricular systolic function  is mildly reduced. The right ventricular size is moderately enlarged.   3. Left atrial size was severely dilated. No left atrial/left atrial  appendage thrombus was detected. The LAA emptying velocity was 27 cm/s.   4. Right atrial size was severely dilated.   5. Moderate atrial functional mitral valve regurgitation. . The mitral  valve is degenerative. Moderate mitral valve regurgitation. No evidence of  mitral stenosis.   6. Severe atrial function tricuspid valve regurgitation. There is a  coaptation gap of 1.19cm due to annular dilation. Calculated EROA of  1.87cm^2. The tricuspid valve is abnormal. Tricuspid valve regurgitation  is severe.   7. The aortic valve is tricuspid. Aortic valve regurgitation is trivial.  No aortic stenosis is present.   8. There is mild (Grade II) plaque involving the aortic arch and  descending aorta.   9. Agitated saline contrast bubble study was negative, with no evidence  of any interatrial shunt.  10. 3D performed of the tricuspid valve and demonstrates lack of  coaptation due to annular dilation.   Conclusion(s)/Recommendation(s): No LA/LAA thrombus identified. Successful  cardioversion performed with restoration of normal sinus rhythm.  _______________   Geoffry Monitor 11/01/2023 to 11/04/2023:   The dominant rhythm is atrial fibrillation, largely with rapid ventricular rates, although normal ventricular response is seen during nighttime hours.  Improved rate control is seen in the last day of the recording.  On the first 2 days of recording the average heart rate was around 130-140 bpm.  On the last day of recording the average heart rate was approximately 85 bpm.   There are rare premature ventricular beats.  There is no evidence of ventricular tachycardia.   There are no episodes of bradycardia or pauses.   Abnormal arrhythmia monitor due to atrial  fibrillation with rapid ventricular response.  Rate control improved during the last 24 hours of recording.  EKG:  EKG ordered today.   EKG Interpretation Date/Time:  Wednesday February 28 2024 09:17:42 EDT Ventricular Rate:  87 PR Interval:    QRS Duration:  76 QT Interval:  380 QTC Calculation: 457 R Axis:   18  Text Interpretation: Atrial fibrillation Isolated T wave inversions in lead III When compared with ECG of 13-Dec-2023 08:56, No significant change was found Confirmed by Jadine Patient [phone]) on 02/28/2024 9:27:11 AM    Recent Labs: 09/13/2023: B Natriuretic Peptide 359.3 09/14/2023: ALT 21; TSH <0.010 09/18/2023: Hemoglobin 12.5; Platelets 176 09/23/2023: BUN 32; Creatinine, Ser 1.21; Magnesium 2.3; Potassium 4.0; Sodium 140  Recent Lipid Panel    Component Value Date/Time   CHOL 191 03/19/2020 1112   TRIG 55.0 03/19/2020 1112   HDL 67.50 03/19/2020 1112   CHOLHDL 3 03/19/2020 1112   VLDL 11.0 03/19/2020 1112   LDLCALC 112 (H) 03/19/2020 1112   LDLDIRECT 113.6 08/14/2013 1557    Physical Exam:    Vital Signs: BP 101/68   Pulse 87   Ht 5' 2 (1.575 m)  Wt 218 lb 3.2 oz (99 kg)   SpO2 97%   BMI 39.91 kg/m     Wt Readings from Last 3 Encounters:  02/28/24 218 lb 3.2 oz (99 kg)  12/13/23 214 lb 3.2 oz (97.2 kg)  11/01/23 218 lb 3.2 oz (99 kg)     General: 78 y.o. obese African-American female in no acute distress. HEENT: Normocephalic and atraumatic. Sclera clear.  Neck: Supple. No carotid bruits. No JVD. Heart: Irregularly irregular rhythm with normal rate. II/ VI systolic murmur. Lungs: No increased work of breathing. Clear to ausculation bilaterally. No wheezes, rhonchi, or rales.  Extremities: Chronic mild non-pitting edema of bilateral lower extremities.  Skin: Warm and dry. Neuro: No focal deficits. Psych: Normal affect. Responds appropriately.   Assessment:    1. Persistent atrial fibrillation (HCC)   2. Chronic heart failure with preserved  ejection fraction (HFpEF) (HCC)   3. Severe tricuspid regurgitation   4. Moderate mitral regurgitation   5. Stage 3a chronic kidney disease (HCC)     Plan:    Persistent Atrial Fibrillation Initially diagnosed in 2021 during an admission for COVID pneumonia. She was recently admitted in 09/2023 for rapid atrial fibrillation and acute CHF. She was also diagnosed with hyperthyroidism and was started on Methimazole . S/p TEE/ DCCV on 09/22/2023. Following procedure, she developed bradycardia and hypotension which was felt to be due to high dose beta-blocker. Metoprolol  was decreased. Unfortunately, she was noted to be back in rapid atrial fibrillation at follow-up visit in 10/2023. Metoprolol  was up-titrated twice. Plan has been for rate control until thyroid  is well controlled.  - Rate controlled.  - Continue Toprol -XL 100mg  twice daily.   - Continue Pradaxa  150mg  twice daily.  - Plan has been for rate control until thyroid  is controlled. It does look like her thyroid  is better controlled now. Most recent TSH on 12/20/2023 per [facility] was 7.050 which is mildly elevated but previously undetectable at <0.010 in 09/2023. I do think she would benefit from being in sinus rhythm given her diastolic CHF. However, her valvular disease and severe biatrial enlargement will also likely make maintaining sinus rhythm difficult. Will refer to Atrial Fibrillation Clinic to discuss best next steps (repeat DCCV, trial of antiarrhythmic, vs ablation).   Chronic HFpEF Work-up for cardiac amyloidosis in 2024 was negative. Recet TEE in 09/2023 showed LVEF of 60-65%, moderate RV enlargement with mildly reduced RV function, severe biatrial enlargement, moderate atrial functional MR, and severe tricuspid annular dilatation with severe atrial function TR and coaptation gap of 1.19cm due to annular dilatation. - Euvolemic on exam.  - Continue Torsemide  40mg  twice daily (KCl 20 mEq daily).  - Continue Losartan  12.5mg  daily.  - Continue  Toprol -XL 100mg  twice daily.  - Continue Jardiance  10mg  daily.  - Discussed importance of daily weights and sodium/ fluid restrictions.   Severe Tricuspid Regurgitation Moderate Mitral Regurgitation  TEE in 09/2023 showed severe biatrial enlargement with moderate atrial functional MR and severe tricuspid annular dilatation with severe atrial functional TR and coaptation gap of 1.19cm due to annular dilatation. Per Dr. Milan note in the hospital, tricuspid TEER score is 1 2 due to septolateral gap and image quality suggestive of moderate chance of procedural success.  - Continue management of CHF as above.  - Can repeat Echo in 09/2024.    CKD Stage IIIa Baseline creatinine around 1.0 to 1.2.  Disposition: Will refer to Atrial Fibrillation Clinic. Follow up with Gen Cards in 4-6 months.   Signed, Raeonna Milo E Isadore Bokhari,  PA-C  02/28/2024 10:35 AM    [facility]

## 2024-02-28 ENCOUNTER — Encounter: Payer: Self-pay | Admitting: Student

## 2024-02-28 ENCOUNTER — Ambulatory Visit: Attending: Cardiology | Admitting: Student

## 2024-02-28 VITALS — BP 101/68 | HR 87 | Ht 62.0 in | Wt 218.2 lb

## 2024-02-28 DIAGNOSIS — I340 Nonrheumatic mitral (valve) insufficiency: Secondary | ICD-10-CM | POA: Diagnosis not present

## 2024-02-28 DIAGNOSIS — N1831 Chronic kidney disease, stage 3a: Secondary | ICD-10-CM

## 2024-02-28 DIAGNOSIS — I071 Rheumatic tricuspid insufficiency: Secondary | ICD-10-CM | POA: Diagnosis not present

## 2024-02-28 DIAGNOSIS — I5032 Chronic diastolic (congestive) heart failure: Secondary | ICD-10-CM

## 2024-02-28 DIAGNOSIS — I4819 Other persistent atrial fibrillation: Secondary | ICD-10-CM

## 2024-02-28 NOTE — Patient Instructions (Addendum)
 Medication Instructions:  Your physician recommends that you continue on your current medications as directed. Please refer to the Current Medication list given to you today.  Labwork: None   Testing/Procedures: None   Follow-Up: Your physician recommends that you schedule a follow-up appointment in: 4-6 Months   Any Other Special Instructions Will Be Listed Below (If Applicable).  If you need a refill on your cardiac medications before your next appointment, please call your pharmacy. Heart Failure Education: Weigh yourself EVERY morning after you go to the bathroom but before you eat or drink anything. Write this number down in a weight log/diary. If you gain 3 pounds overnight or 5 pounds in a week, call the office. Take your medicines as prescribed. If you have concerns about your medications, please call us  before you stop taking them.  Eat low salt foods--Limit salt (sodium) to 2000 mg per day. This will help prevent your body from holding onto fluid. Read food labels as many processed foods have a lot of sodium, especially canned goods and prepackaged meats. If you would like some assistance choosing low sodium foods, we would be happy to set you up with a nutritionist. Limit all fluids for the day to less than 2 liters (64 ounces). Fluid includes all drinks, coffee, juice, ice chips, soup, jello, and all other liquids. Stay as active as you can everyday. Staying active will give you more energy and make your muscles stronger. Start with 5 minutes at a time and work your way up to 30 minutes a day. Break up your activities--do some in the morning and some in the afternoon. Start with 3 days per week and work your way up to 5 days as you can.  If you have chest pain, feel short of breath, dizzy, or lightheaded, STOP. If you don't feel better after a short rest, call 911. If you do feel better, call the office to let us  know you have symptoms with exercise.

## 2024-03-05 ENCOUNTER — Other Ambulatory Visit: Payer: Self-pay | Admitting: Cardiovascular Disease

## 2024-03-12 ENCOUNTER — Ambulatory Visit: Admitting: Cardiology

## 2024-03-20 ENCOUNTER — Encounter (HOSPITAL_COMMUNITY): Payer: Self-pay | Admitting: Physician Assistant

## 2024-03-20 ENCOUNTER — Ambulatory Visit (HOSPITAL_COMMUNITY)
Admission: RE | Admit: 2024-03-20 | Discharge: 2024-03-20 | Disposition: A | Discharge status: Home / Self Care | Source: Ambulatory Visit | Attending: Physician Assistant | Admitting: Physician Assistant

## 2024-03-20 VITALS — BP 92/60 | HR 75 | Ht 62.0 in | Wt 215.4 lb

## 2024-03-20 DIAGNOSIS — D6869 Other thrombophilia: Secondary | ICD-10-CM | POA: Diagnosis not present

## 2024-03-20 DIAGNOSIS — I4819 Other persistent atrial fibrillation: Secondary | ICD-10-CM

## 2024-03-20 NOTE — Progress Notes (Signed)
 Primary Care Physician: Campbell Reynolds, NP Primary Cardiologist: Jerel Balding, MD Electrophysiologist: None  Referring Physician: Aline Door PA   Julia Mcfarland is a 78 y.o. female with a history of CHF, VHD, CKD, hyperthyroidism, atrial fibrillation who presents for follow up in the [facility]. Patient was originally diagnosed with afib in the setting of a hospitalization for COVID in 2021. Unclear duration of afib since then. She was admitted in 09/2023 for acute CHF in setting of rapid atrial fibrillation. She was aggressively diuresed and then underwent TEE/ DCCV with restoration of sinus rhythm. She was also noted to have hyperthyroidism and was started on Methimazole . She unfortunately had quick return of atrial fibrillation in 10/2023 possibly due to hyperthyroidism. Plan was for rate control until thyroid  was well controlled. Patient is on Pradaxa  for stroke prevention.    Patient presents today for follow up for atrial fibrillation. She remains in rate controlled afib today and denies awareness of her arrhythmia. No bleeding issues on anticoagulation.   Today, she denies symptoms of palpitations, chest pain, shortness of breath, orthopnea, PND, dizziness, presyncope, syncope, snoring, daytime somnolence, bleeding, or neurologic sequela. The patient is tolerating medications without difficulties and is otherwise without complaint today.    Atrial Fibrillation Risk Factors:  she does not have symptoms or diagnosis of sleep apnea. she does not have a history of rheumatic fever.   Atrial Fibrillation Management history:  Previous antiarrhythmic drugs: none Previous cardioversions: 09/22/23 Previous ablations: none Anticoagulation history: Pradaxa   ROS- All systems are reviewed and negative except as per the HPI above.  Past Medical History:  Diagnosis Date   Allergy    Arthritis    Chicken pox    Chronic venous insufficiency    a. Uses  furosemide  prn.   Degenerative joint disease    Morbid obesity (HCC)    Osteoarthritis    PONV (postoperative nausea and vomiting)     Current Outpatient Medications  Medication Sig Dispense Refill   albuterol  (VENTOLIN  HFA) 108 (90 Base) MCG/ACT inhaler INHALE 2 PUFFS INTO LUNGS EVERY 6 HOURS AS NEEDED FOR WHEEZING OR SHORTNESS OF BREATH 6.7 each 1   Ascorbic Acid  (VITAMIN C ) 1000 MG tablet Take 1,000 mg by mouth daily.     cetirizine (ZYRTEC) 10 MG tablet Take 10 mg by mouth daily.     Cholecalciferol  (VITAMIN D3) 5000 UNITS CAPS Take 5,000 Units by mouth daily.      dabigatran  (PRADAXA ) 150 MG CAPS capsule Take 1 capsule (150 mg total) by mouth 2 (two) times daily. 60 capsule 0   empagliflozin  (JARDIANCE ) 10 MG TABS tablet Take 1 tablet (10 mg total) by mouth daily before breakfast. 30 tablet 6   losartan  (COZAAR ) 25 MG tablet Take 1/2 (one-half) tablet by mouth once daily 45 tablet 3   methimazole  (TAPAZOLE ) 10 MG tablet Take 1 tablet (10 mg total) by mouth 2 (two) times daily. 60 tablet 0   metoprolol  succinate (TOPROL -XL) 100 MG 24 hr tablet Take 1 tablet (100 mg total) by mouth 2 (two) times daily. 60 tablet 6   polyethylene glycol (MIRALAX  / GLYCOLAX ) 17 g packet Take 17 g by mouth daily as needed for mild constipation.     potassium chloride  SA (KLOR-CON  M) 20 MEQ tablet Take 1 tablet (20 mEq total) by mouth daily. 30 tablet 11   torsemide  (DEMADEX ) 20 MG tablet Take 2 tablets (40 mg total) by mouth 2 (two) times daily. 120 tablet 0  vitamin B-12 (CYANOCOBALAMIN) 500 MCG tablet Take 500 mcg by mouth daily.     No current facility-administered medications for this encounter.    Physical Exam: BP 92/60   Pulse 75   Ht 5' 2 (1.575 m)   Wt 97.7 kg   BMI 39.40 kg/m   GEN: Well nourished, well developed in no acute distress NECK: No JVD CARDIAC: Irregularly irregular rate and rhythm, no murmurs, rubs, gallops RESPIRATORY:  Clear to auscultation without rales, wheezing or  rhonchi  ABDOMEN: Soft, non-tender, non-distended EXTREMITIES:  Non pitting edema bilaterally, chronic venous stasis, No deformity   Wt Readings from Last 3 Encounters:  03/20/24 97.7 kg  02/28/24 99 kg  12/13/23 97.2 kg     EKG today demonstrates  Afib Vent. rate 75 BPM PR interval * ms QRS duration 80 ms QT/QTcB 388/433 ms  Echo 09/14/23 demonstrated   1. Left ventricular ejection fraction, by estimation, is 65 to 70%. The  left ventricle has normal function. The left ventricle has no regional  wall motion abnormalities. Left ventricular diastolic function could not  be evaluated.   2. Right ventricular systolic function is normal. The right ventricular  size is severely enlarged. There is moderately elevated pulmonary artery  systolic pressure. The estimated right ventricular systolic pressure is  46.1 mmHg.   3. Left atrial size was moderately dilated.   4. Right atrial size was severely dilated.   5. The mitral valve is normal in structure. Moderate mitral valve  regurgitation. No evidence of mitral stenosis.   6. 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   7. The aortic valve is normal in structure. Aortic valve regurgitation is  not visualized. No aortic stenosis is present.   8. The inferior vena cava is dilated in size with <50% respiratory  variability, suggesting right atrial pressure of 15 mmHg.     CHA2DS2-VASc Score = 4  The patient's score is based upon: CHF History: 1 HTN History: 0 Diabetes History: 0 Stroke History: 0 Vascular Disease History: 0 Age Score: 2 Gender Score: 1       ASSESSMENT AND PLAN: Persistent Atrial Fibrillation (ICD10:  I48.19) The patient's CHA2DS2-VASc score is 4, indicating a 4.8% annual risk of stroke.   Patient in persistent afib, possibly longstanding. Severe biatrial enlargement noted on her echo. We discussed rate vs rhythm control today. Would avoid class IC and Multaq with h/o CHF. Would also avoid amiodarone  given  hyperthyroidism. We discussed DCCV alone (now that thyroid  function is normal) vs dofetilide admission vs rate control. She would like to take time to think about her options.  Continue Pradaxa  150 mg BID Continue Toprol  100 mg BID  Secondary Hypercoagulable State (ICD10:  D68.69) The patient is at significant risk for stroke/thromboembolism based upon her CHA2DS2-VASc Score of 4.  Continue Dabigatran  (Pradaxa ). No bleeding issues.   Chronic HFpEF EF 65-70% GDMT per primary cardiology team Fluid status appears stable today  VHD Severe TR Moderate MR Followed by Dr Francyne  Obesity Body mass index is 39.4 kg/m.  Encouraged lifestyle modification   Follow up in the AF clinic in 2-3 months to revisit rate vs rhythm control.      [facility] [address] [place], [place] [address] [phone]

## 2024-03-26 ENCOUNTER — Other Ambulatory Visit: Payer: Self-pay | Admitting: Student

## 2024-03-26 DIAGNOSIS — R921 Mammographic calcification found on diagnostic imaging of breast: Secondary | ICD-10-CM

## 2024-05-01 ENCOUNTER — Ambulatory Visit
Admission: RE | Admit: 2024-05-01 | Discharge: 2024-05-01 | Disposition: A | Discharge status: Home / Self Care | Source: Ambulatory Visit | Attending: Student | Admitting: Student

## 2024-05-01 DIAGNOSIS — R921 Mammographic calcification found on diagnostic imaging of breast: Secondary | ICD-10-CM

## 2024-05-06 ENCOUNTER — Encounter: Payer: Self-pay | Admitting: Radiology

## 2024-06-19 ENCOUNTER — Ambulatory Visit (HOSPITAL_COMMUNITY): Admitting: Physician Assistant

## 2024-06-19 ENCOUNTER — Ambulatory Visit (HOSPITAL_COMMUNITY): Admitting: Internal Medicine

## 2024-07-03 ENCOUNTER — Ambulatory Visit (HOSPITAL_COMMUNITY)
Admission: RE | Admit: 2024-07-03 | Discharge: 2024-07-03 | Disposition: A | Discharge status: Home / Self Care | Source: Ambulatory Visit | Attending: Physician Assistant | Admitting: Physician Assistant

## 2024-07-03 VITALS — BP 92/68 | HR 75 | Ht 62.0 in | Wt 221.4 lb

## 2024-07-03 DIAGNOSIS — I4819 Other persistent atrial fibrillation: Secondary | ICD-10-CM | POA: Diagnosis not present

## 2024-07-03 DIAGNOSIS — D6869 Other thrombophilia: Secondary | ICD-10-CM | POA: Diagnosis not present

## 2024-07-03 DIAGNOSIS — I4891 Unspecified atrial fibrillation: Secondary | ICD-10-CM | POA: Diagnosis not present

## 2024-07-03 NOTE — Progress Notes (Signed)
 "   Primary Care Physician: Campbell Reynolds, NP Primary Cardiologist: Jerel Balding, MD Electrophysiologist: None  Referring Physician: Aline Door PA   Julia Mcfarland is a 78 y.o. female with a history of CHF, VHD, CKD, hyperthyroidism, atrial fibrillation who presents for follow up in the [facility]. Patient was originally diagnosed with afib in the setting of a hospitalization for COVID in 2021. Unclear duration of afib since then. She was admitted in 09/2023 for acute CHF in setting of rapid atrial fibrillation. She was aggressively diuresed and then underwent TEE/ DCCV with restoration of sinus rhythm. She was also noted to have hyperthyroidism and was started on Methimazole . She unfortunately had quick return of atrial fibrillation in 10/2023 possibly due to hyperthyroidism. Plan was for rate control until thyroid  was well controlled. Patient is on Pradaxa  for stroke prevention.    Patient returns for follow up for atrial fibrillation. She remains in rate controlled afib today and feels well. She continues to deny any awareness of her arrhythmia. No bleeding issues on anticoagulation.   Today, she  denies symptoms of palpitations, chest pain, shortness of breath, orthopnea, PND, lower extremity edema, dizziness, presyncope, syncope, snoring, daytime somnolence, bleeding, or neurologic sequela. The patient is tolerating medications without difficulties and is otherwise without complaint today.    Atrial Fibrillation Risk Factors:  she does not have symptoms or diagnosis of sleep apnea. she does not have a history of rheumatic fever.   Atrial Fibrillation Management history:  Previous antiarrhythmic drugs: none Previous cardioversions: 09/22/23 Previous ablations: none Anticoagulation history: Pradaxa   ROS- All systems are reviewed and negative except as per the HPI above.  Past Medical History:  Diagnosis Date   Allergy    Arthritis    Chicken pox     Chronic venous insufficiency    a. Uses furosemide  prn.   Degenerative joint disease    Morbid obesity (HCC)    Osteoarthritis    PONV (postoperative nausea and vomiting)     Current Outpatient Medications  Medication Sig Dispense Refill   albuterol  (VENTOLIN  HFA) 108 (90 Base) MCG/ACT inhaler INHALE 2 PUFFS INTO LUNGS EVERY 6 HOURS AS NEEDED FOR WHEEZING OR SHORTNESS OF BREATH 6.7 each 1   Ascorbic Acid  (VITAMIN C ) 1000 MG tablet Take 1,000 mg by mouth daily.     cetirizine (ZYRTEC) 10 MG tablet Take 10 mg by mouth daily.     Cholecalciferol  (VITAMIN D3) 5000 UNITS CAPS Take 5,000 Units by mouth daily.      dabigatran  (PRADAXA ) 150 MG CAPS capsule Take 1 capsule (150 mg total) by mouth 2 (two) times daily. 60 capsule 0   empagliflozin  (JARDIANCE ) 10 MG TABS tablet Take 1 tablet (10 mg total) by mouth daily before breakfast. 30 tablet 6   losartan  (COZAAR ) 25 MG tablet Take 1/2 (one-half) tablet by mouth once daily 45 tablet 3   methimazole  (TAPAZOLE ) 5 MG tablet Take 5 mg by mouth daily.     metoprolol  succinate (TOPROL -XL) 100 MG 24 hr tablet Take 1 tablet (100 mg total) by mouth 2 (two) times daily. 60 tablet 6   polyethylene glycol (MIRALAX  / GLYCOLAX ) 17 g packet Take 17 g by mouth daily as needed for mild constipation. (Patient taking differently: Take 17 g by mouth as needed for mild constipation.)     potassium chloride  SA (KLOR-CON  M) 20 MEQ tablet Take 1 tablet (20 mEq total) by mouth daily. 30 tablet 11   torsemide  (DEMADEX ) 20 MG tablet Take  2 tablets (40 mg total) by mouth 2 (two) times daily. 120 tablet 0   vitamin B-12 (CYANOCOBALAMIN) 500 MCG tablet Take 500 mcg by mouth daily.     methimazole  (TAPAZOLE ) 10 MG tablet Take 1 tablet (10 mg total) by mouth 2 (two) times daily. (Patient not taking: Reported on 07/03/2024) 60 tablet 0   No current facility-administered medications for this encounter.    Physical Exam: BP 92/68   Pulse 75   Ht 5' 2 (1.575 m)   Wt 100.4  kg   BMI 40.49 kg/m   GEN: Well nourished, well developed in no acute distress CARDIAC: Irregularly irregular rate and rhythm, no murmurs, rubs, gallops RESPIRATORY:  Clear to auscultation without rales, wheezing or rhonchi  ABDOMEN: Soft, non-tender, non-distended EXTREMITIES:  No edema; No deformity    Wt Readings from Last 3 Encounters:  07/03/24 100.4 kg  03/20/24 97.7 kg  02/28/24 99 kg     EKG Interpretation Date/Time:  Wednesday July 03 2024 11:19:02 EST Ventricular Rate:  75 PR Interval:    QRS Duration:  80 QT Interval:  378 QTC Calculation: 422 R Axis:   125  Text Interpretation: Atrial fibrillation Right axis deviation Septal infarct (cited on or before 20-Mar-2024) Abnormal ECG When compared with ECG of 20-Mar-2024 10:10, No significant change was found Confirmed by Darnetta Kesselman ([id number]) on 07/03/2024 11:25:05 AM   Echo 09/14/23 demonstrated   1. Left ventricular ejection fraction, by estimation, is 65 to 70%. The  left ventricle has normal function. The left ventricle has no regional  wall motion abnormalities. Left ventricular diastolic function could not  be evaluated.   2. Right ventricular systolic function is normal. The right ventricular  size is severely enlarged. There is moderately elevated pulmonary artery  systolic pressure. The estimated right ventricular systolic pressure is  46.1 mmHg.   3. Left atrial size was moderately dilated.   4. Right atrial size was severely dilated.   5. The mitral valve is normal in structure. Moderate mitral valve  regurgitation. No evidence of mitral stenosis.   6. 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   7. The aortic valve is normal in structure. Aortic valve regurgitation is  not visualized. No aortic stenosis is present.   8. The inferior vena cava is dilated in size with <50% respiratory  variability, suggesting right atrial pressure of 15 mmHg.    CHA2DS2-VASc Score = 4  The patient's score is based  upon: CHF History: 1 HTN History: 0 Diabetes History: 0 Stroke History: 0 Vascular Disease History: 0 Age Score: 2 Gender Score: 1       ASSESSMENT AND PLAN: Persistent Atrial Fibrillation (ICD10:  I48.19) The patient's CHA2DS2-VASc score is 4, indicating a 4.8% annual risk of stroke.   Patient remains in persistent afib today, possibly longstanding.  We discussed rate vs rhythm control today. Would avoid class IC with h/o CHF. Would also avoid amiodarone  with h/o hyperthyroidism. She is a borderline candidate for ablation with BMI and possible longstanding afib. We discussed DCCV alone vs dofetilide admission vs rate control. She would like to pursue rate control only for now. We did discuss that if she wanted rhythm control, dofetilide would likely be her best option. We also discussed that the likelihood of maintaining SR decreases the longer she is in afib. She voiced understanding.  Continue Pradaxa  150 mg BID Continue Toprol  100 mg BID  Secondary Hypercoagulable State (ICD10:  D68.69) The patient is at significant risk for stroke/thromboembolism based upon  her CHA2DS2-VASc Score of 4.  Continue Dabigatran  (Pradaxa ). No bleeding issues.   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  Encouraged lifestyle modification   Follow up with Dr Francyne per recall.     [facility] [address] [place], [place] [address] [phone] "
